# Patient Record
Sex: Female | Born: 1965
Health system: Southern US, Community
[De-identification: ages and names within clinical notes are randomized; demographics above are authoritative.]

## PROBLEM LIST (undated history)

## (undated) DIAGNOSIS — F329 Major depressive disorder, single episode, unspecified: Secondary | ICD-10-CM

## (undated) DIAGNOSIS — F32A Depression, unspecified: Secondary | ICD-10-CM

## (undated) DIAGNOSIS — M1990 Unspecified osteoarthritis, unspecified site: Secondary | ICD-10-CM

## (undated) DIAGNOSIS — M5430 Sciatica, unspecified side: Secondary | ICD-10-CM

## (undated) DIAGNOSIS — T7840XA Allergy, unspecified, initial encounter: Secondary | ICD-10-CM

## (undated) DIAGNOSIS — R112 Nausea with vomiting, unspecified: Secondary | ICD-10-CM

## (undated) DIAGNOSIS — Z9889 Other specified postprocedural states: Secondary | ICD-10-CM

## (undated) DIAGNOSIS — C50911 Malignant neoplasm of unspecified site of right female breast: Secondary | ICD-10-CM

## (undated) HISTORY — DX: Allergy, unspecified, initial encounter: T78.40XA

## (undated) HISTORY — PX: BREAST CYST ASPIRATION: SHX578

## (undated) HISTORY — PX: BREAST BIOPSY: SHX20

## (undated) HISTORY — PX: ABDOMINAL HYSTERECTOMY: SHX81

## (undated) HISTORY — PX: WISDOM TOOTH EXTRACTION: SHX21

## (undated) HISTORY — PX: TONSILLECTOMY: SUR1361

---
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
## 1988-01-05 HISTORY — PX: TOE SURGERY: SHX1073

## 1991-01-05 HISTORY — PX: CERVIX SURGERY: SHX593

## 2003-01-05 HISTORY — PX: NASAL/SINUS ENDOSCOPY: SHX288

## 2011-07-01 DIAGNOSIS — M549 Dorsalgia, unspecified: Secondary | ICD-10-CM | POA: Insufficient documentation

## 2011-07-01 DIAGNOSIS — M25569 Pain in unspecified knee: Secondary | ICD-10-CM | POA: Insufficient documentation

## 2011-07-01 DIAGNOSIS — M1710 Unilateral primary osteoarthritis, unspecified knee: Secondary | ICD-10-CM | POA: Insufficient documentation

## 2011-07-01 DIAGNOSIS — M25369 Other instability, unspecified knee: Secondary | ICD-10-CM | POA: Insufficient documentation

## 2012-04-27 ENCOUNTER — Other Ambulatory Visit (HOSPITAL_COMMUNITY): Payer: Self-pay | Admitting: Orthopedic Surgery

## 2012-04-27 DIAGNOSIS — M25561 Pain in right knee: Secondary | ICD-10-CM

## 2012-04-29 ENCOUNTER — Ambulatory Visit (HOSPITAL_COMMUNITY)
Admission: RE | Admit: 2012-04-29 | Discharge: 2012-04-29 | Disposition: A | Discharge status: Home / Self Care | Payer: 59 | Source: Ambulatory Visit | Attending: Orthopedic Surgery | Admitting: Orthopedic Surgery

## 2012-04-29 DIAGNOSIS — M25569 Pain in unspecified knee: Secondary | ICD-10-CM | POA: Insufficient documentation

## 2012-04-29 DIAGNOSIS — M2240 Chondromalacia patellae, unspecified knee: Secondary | ICD-10-CM | POA: Insufficient documentation

## 2012-04-29 DIAGNOSIS — M25561 Pain in right knee: Secondary | ICD-10-CM

## 2013-12-14 ENCOUNTER — Emergency Department (HOSPITAL_COMMUNITY)
Admission: EM | Admit: 2013-12-14 | Discharge: 2013-12-14 | Disposition: A | Discharge status: Home / Self Care | Payer: 59 | Source: Home / Self Care | Attending: Family Medicine | Admitting: Family Medicine

## 2013-12-14 ENCOUNTER — Encounter (HOSPITAL_COMMUNITY): Payer: Self-pay | Admitting: *Deleted

## 2013-12-14 DIAGNOSIS — M5432 Sciatica, left side: Secondary | ICD-10-CM

## 2013-12-14 MED ORDER — CYCLOBENZAPRINE HCL 5 MG PO TABS: 5.0000 mg | ORAL_TABLET | Freq: Three times a day (TID) | ORAL | Status: DC | PRN

## 2013-12-14 MED ORDER — METHYLPREDNISOLONE 4 MG PO KIT
PACK | ORAL | Status: DC
Start: 2013-12-14 — End: 2015-09-12

## 2013-12-14 MED ORDER — METHYLPREDNISOLONE SODIUM SUCC 125 MG IJ SOLR
125.0000 mg | Freq: Once | INTRAMUSCULAR | Status: AC
  Administered 2013-12-14: 125 mg via INTRAMUSCULAR

## 2013-12-14 MED ORDER — METHYLPREDNISOLONE SODIUM SUCC 125 MG IJ SOLR
INTRAMUSCULAR | Status: AC
  Filled 2013-12-14: qty 2

## 2013-12-14 NOTE — ED Provider Notes (Signed)
CSN: [id number]     Arrival date & time 12/14/13  1038 History   First MD Initiated Contact with Patient 12/14/13 1128     Chief Complaint  Patient presents with  . Back Pain   (Consider location/radiation/quality/duration/timing/severity/associated sxs/prior Treatment) Patient is a 48 y.o. female presenting with back pain. The history is provided by the patient.  Back Pain Location:  Sacro-iliac joint Quality:  Shooting Radiates to:  L posterior upper leg and L foot Pain severity:  Moderate Onset quality:  Sudden Duration:  5 days Timing:  Constant Progression:  Worsening Chronicity:  New Context: not recent illness, not recent injury and not twisting   Relieved by:  None tried Worsened by:  Nothing tried Ineffective treatments:  None tried Associated symptoms: leg pain, numbness and paresthesias   Associated symptoms: no abdominal pain, no bladder incontinence, no bowel incontinence, no dysuria, no fever and no pelvic pain   Risk factors: no steroid use     History reviewed. No pertinent past medical history. History reviewed. No pertinent past surgical history. History reviewed. No pertinent family history. History  Substance Use Topics  . Smoking status: Not on file  . Smokeless tobacco: Not on file  . Alcohol Use: No   OB History    No data available     Review of Systems  Constitutional: Negative.  Negative for fever.  Gastrointestinal: Negative.  Negative for abdominal pain and bowel incontinence.  Genitourinary: Negative for bladder incontinence, dysuria, urgency, hematuria, flank pain, difficulty urinating, menstrual problem and pelvic pain.  Musculoskeletal: Positive for back pain and gait problem. Negative for joint swelling and neck pain.  Skin: Negative.   Neurological: Positive for numbness and paresthesias.    Allergies  Keflex  Home Medications   Prior to Admission medications   Medication Sig Start Date End Date Taking? Authorizing Provider    Naproxen Sodium (ALEVE PO) Take by mouth.   Yes Historical Provider, MD  cyclobenzaprine (FLEXERIL) 5 MG tablet Take 1 tablet (5 mg total) by mouth 3 (three) times daily as needed for muscle spasms. 12/14/13   Billy Fischer, MD  methylPREDNISolone (MEDROL DOSEPAK) 4 MG tablet follow package directions, start on sat, take until finished 12/14/13   Billy Fischer, MD   BP 130/97 mmHg  Pulse 80  Temp(Src) 98 F (36.7 C) (Oral)  Resp 16  SpO2 96% Physical Exam  Constitutional: She is oriented to person, place, and time. She appears well-developed and well-nourished. She appears distressed.  Abdominal: Soft. Bowel sounds are normal.  Musculoskeletal: Normal range of motion. She exhibits tenderness.       Lumbar back: She exhibits tenderness and pain. She exhibits normal pulse.       Back:  Neurological: She is alert and oriented to person, place, and time.  Skin: Skin is warm and dry.  Nursing note and vitals reviewed.   ED Course  Procedures (including critical care time) Labs Review Labs Reviewed - No data to display  Imaging Review No results found.   MDM   1. Sciatica neuralgia, left        Billy Fischer, MD 12/14/13 1153

## 2013-12-14 NOTE — ED Notes (Signed)
Pt   Reports  Pain lower  Back  Down      l  Leg           X     1  Week     -  denys  A  specefic  Injury          Pt         Reports  Symptoms  Not  releived  By  Beckie Busing          And  Home  remedys

## 2013-12-14 NOTE — Discharge Instructions (Signed)
Heat and bedrest, take medicine as prescribed, see dr Ellene Route if problem continues.

## 2015-07-24 DIAGNOSIS — H524 Presbyopia: Secondary | ICD-10-CM | POA: Diagnosis not present

## 2015-08-25 DIAGNOSIS — Z1231 Encounter for screening mammogram for malignant neoplasm of breast: Secondary | ICD-10-CM | POA: Diagnosis not present

## 2015-08-25 DIAGNOSIS — Z1212 Encounter for screening for malignant neoplasm of rectum: Secondary | ICD-10-CM | POA: Diagnosis not present

## 2015-08-25 DIAGNOSIS — Z01419 Encounter for gynecological examination (general) (routine) without abnormal findings: Secondary | ICD-10-CM | POA: Diagnosis not present

## 2015-08-27 ENCOUNTER — Other Ambulatory Visit: Payer: Self-pay | Admitting: Gynecology

## 2015-08-27 ENCOUNTER — Ambulatory Visit (HOSPITAL_COMMUNITY)
Admission: EM | Admit: 2015-08-27 | Discharge: 2015-08-27 | Disposition: A | Discharge status: Home / Self Care | Payer: 59 | Attending: Nurse Practitioner | Admitting: Nurse Practitioner

## 2015-08-27 ENCOUNTER — Encounter (HOSPITAL_COMMUNITY): Payer: Self-pay | Admitting: Emergency Medicine

## 2015-08-27 DIAGNOSIS — M5432 Sciatica, left side: Secondary | ICD-10-CM

## 2015-08-27 DIAGNOSIS — R928 Other abnormal and inconclusive findings on diagnostic imaging of breast: Secondary | ICD-10-CM

## 2015-08-27 MED ORDER — METHYLPREDNISOLONE SODIUM SUCC 125 MG IJ SOLR
INTRAMUSCULAR | Status: AC
  Filled 2015-08-27: qty 2

## 2015-08-27 MED ORDER — PREDNISONE 20 MG PO TABS: 20.0000 mg | ORAL_TABLET | Freq: Every day | ORAL | 0 refills | Status: AC

## 2015-08-27 MED ORDER — METHYLPREDNISOLONE SODIUM SUCC 125 MG IJ SOLR
125.0000 mg | Freq: Once | INTRAMUSCULAR | Status: AC
  Administered 2015-08-27: 125 mg via INTRAMUSCULAR

## 2015-08-27 NOTE — ED Triage Notes (Signed)
The patient presented to the [facility] with a complaint of lower back pain on the right side that radiates into her right leg. The patient stated that the pain started this am. The patient denied any known injury but has a hx of sciatica.

## 2015-08-27 NOTE — ED Provider Notes (Signed)
CSN: [id number]     Arrival date & time 08/27/15  [id number] History   First MD Initiated Contact with Patient 08/27/15 1053     Chief Complaint  Patient presents with  . Back Pain   (Consider location/radiation/quality/duration/timing/severity/associated sxs/prior Treatment) Patient is a 50 y.o female, presents today for left sciatica pain onset this morning. 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 Pain is 9/10, constantly sharp, radiates down her left hip. She denies bladder or bowel incontinence, dizziness, numbness. This has happened before and was treated with solu-medrol 125mg  IM last night and discharged with steroid prescription, which she says that they helped. Patient would like another steroid  Shot today.         History reviewed. No pertinent past medical history. History reviewed. No pertinent surgical history. History reviewed. No pertinent family history. Social History  Substance Use Topics  . Smoking status: Never Smoker  . Smokeless tobacco: Never Used  . Alcohol use No   OB History    No data available     Review of Systems  Constitutional: Negative for chills, fatigue and fever.  Respiratory: Negative.  Negative for shortness of breath.   Cardiovascular: Negative.  Negative for chest pain and palpitations.  Gastrointestinal: Negative for abdominal pain.       Negative for incontinence   Musculoskeletal: Positive for back pain.       See HPI  Neurological: Negative for dizziness and numbness.  All other systems reviewed and are negative.   Allergies  Keflex [cephalexin]  Home Medications   Prior to Admission medications   Medication Sig Start Date End Date Taking? Authorizing Provider  cyclobenzaprine (FLEXERIL) 5 MG tablet Take 1 tablet (5 mg total) by mouth 3 (three) times daily as needed for muscle spasms. 12/14/13   Billy Fischer, MD  methylPREDNISolone (MEDROL DOSEPAK) 4 MG tablet follow package  directions, start on sat, take until finished 12/14/13   Billy Fischer, MD  Naproxen Sodium (ALEVE PO) Take by mouth.    Historical Provider, MD  predniSONE (DELTASONE) 20 MG tablet Take 1 tablet (20 mg total) by mouth daily with breakfast. Take 3 tablets from 1-3, take 2 tablets from day 4-6, take 1 tablet from day 7-9. 08/27/15 09/05/15  Barry Dienes, NP   Meds Ordered and Administered this Visit   Medications  methylPREDNISolone sodium succinate (SOLU-MEDROL) 125 mg/2 mL injection 125 mg (125 mg Intramuscular Given 08/27/15 1126)    BP 115/80 (BP Location: Left Arm)   Pulse 78   Temp 99.5 F (37.5 C) (Oral)   Resp 20   SpO2 96%  No data found.   Physical Exam  Constitutional: She appears well-developed and well-nourished.  Cardiovascular: Normal rate and regular rhythm.   Pulmonary/Chest: Effort normal and breath sounds normal.  Abdominal: Soft. Bowel sounds are normal.  Musculoskeletal: Normal range of motion. She exhibits no edema, tenderness or deformity.  No tenderness on palpation over her left lower back region. Positive straight leg raise.   Lymphadenopathy:    She has no cervical adenopathy.  Nursing note and vitals reviewed.   Urgent Care Course   Clinical Course    Procedures (including critical care time)  Labs Review Labs Reviewed - No data to display  Imaging Review No results found.   Visual Acuity Review  Right Eye Distance:   Left Eye Distance:   Bilateral Distance:    Right Eye Near:  Left Eye Near:    Bilateral Near:         MDM   1. Sciatica of left side    No concern or red flags noted on the physical exam. Patient is neurologically intact.  Patient treated with solumedrol 125 mg as requested. Patient also sent home with prednisone taper for 9 days. Patient instructed to follow primary care doctor if she does not improve. Work note was offered but patient declined. Discharge injection given. All questions were answered    Barry Dienes,  NP 08/27/15 1201

## 2015-09-02 ENCOUNTER — Other Ambulatory Visit: Payer: Self-pay | Admitting: Gynecology

## 2015-09-02 ENCOUNTER — Ambulatory Visit
Admission: RE | Admit: 2015-09-02 | Discharge: 2015-09-02 | Disposition: A | Discharge status: Home / Self Care | Payer: 59 | Source: Ambulatory Visit | Attending: Gynecology | Admitting: Gynecology

## 2015-09-02 DIAGNOSIS — R928 Other abnormal and inconclusive findings on diagnostic imaging of breast: Secondary | ICD-10-CM

## 2015-09-02 DIAGNOSIS — R920 Mammographic microcalcification found on diagnostic imaging of breast: Secondary | ICD-10-CM | POA: Diagnosis not present

## 2015-09-02 DIAGNOSIS — N6310 Unspecified lump in the right breast, unspecified quadrant: Secondary | ICD-10-CM

## 2015-09-02 DIAGNOSIS — N63 Unspecified lump in breast: Secondary | ICD-10-CM | POA: Diagnosis not present

## 2015-09-02 DIAGNOSIS — R921 Mammographic calcification found on diagnostic imaging of breast: Secondary | ICD-10-CM

## 2015-09-05 ENCOUNTER — Other Ambulatory Visit: Payer: Self-pay | Admitting: Gynecology

## 2015-09-05 DIAGNOSIS — R921 Mammographic calcification found on diagnostic imaging of breast: Secondary | ICD-10-CM

## 2015-09-05 DIAGNOSIS — N6310 Unspecified lump in the right breast, unspecified quadrant: Secondary | ICD-10-CM

## 2015-09-09 ENCOUNTER — Ambulatory Visit
Admission: RE | Admit: 2015-09-09 | Discharge: 2015-09-09 | Disposition: A | Discharge status: Home / Self Care | Payer: 59 | Source: Ambulatory Visit | Attending: Gynecology | Admitting: Gynecology

## 2015-09-09 DIAGNOSIS — R921 Mammographic calcification found on diagnostic imaging of breast: Secondary | ICD-10-CM

## 2015-09-09 DIAGNOSIS — N6310 Unspecified lump in the right breast, unspecified quadrant: Secondary | ICD-10-CM

## 2015-09-09 DIAGNOSIS — N63 Unspecified lump in breast: Secondary | ICD-10-CM | POA: Diagnosis not present

## 2015-09-09 DIAGNOSIS — D0511 Intraductal carcinoma in situ of right breast: Secondary | ICD-10-CM | POA: Diagnosis not present

## 2015-09-09 DIAGNOSIS — C50411 Malignant neoplasm of upper-outer quadrant of right female breast: Secondary | ICD-10-CM | POA: Diagnosis not present

## 2015-09-09 DIAGNOSIS — C771 Secondary and unspecified malignant neoplasm of intrathoracic lymph nodes: Secondary | ICD-10-CM | POA: Diagnosis not present

## 2015-09-11 ENCOUNTER — Other Ambulatory Visit: Payer: Self-pay | Admitting: General Surgery

## 2015-09-11 ENCOUNTER — Telehealth: Payer: Self-pay | Admitting: *Deleted

## 2015-09-11 DIAGNOSIS — N6310 Unspecified lump in the right breast, unspecified quadrant: Secondary | ICD-10-CM

## 2015-09-11 DIAGNOSIS — C50411 Malignant neoplasm of upper-outer quadrant of right female breast: Secondary | ICD-10-CM | POA: Diagnosis not present

## 2015-09-11 NOTE — Telephone Encounter (Signed)
Received referral from Dr. Donne Hazel for new breast cancer to see Dr. Lindi Adie. Received appt from Dr. Lindi Adie for 09/12/15 at [place] appt time to Dr. Donne Hazel.  Gave appt to new breast pt coordinator to contact pt with new pt information.

## 2015-09-12 ENCOUNTER — Other Ambulatory Visit: Payer: Self-pay | Admitting: *Deleted

## 2015-09-12 ENCOUNTER — Telehealth: Payer: Self-pay | Admitting: *Deleted

## 2015-09-12 ENCOUNTER — Encounter: Payer: Self-pay | Admitting: Hematology and Oncology

## 2015-09-12 ENCOUNTER — Ambulatory Visit
Admission: RE | Admit: 2015-09-12 | Discharge: 2015-09-12 | Disposition: A | Discharge status: Home / Self Care | Payer: 59 | Source: Ambulatory Visit | Attending: General Surgery | Admitting: General Surgery

## 2015-09-12 ENCOUNTER — Ambulatory Visit (HOSPITAL_BASED_OUTPATIENT_CLINIC_OR_DEPARTMENT_OTHER): Payer: 59 | Admitting: Hematology and Oncology

## 2015-09-12 DIAGNOSIS — C50411 Malignant neoplasm of upper-outer quadrant of right female breast: Secondary | ICD-10-CM | POA: Diagnosis not present

## 2015-09-12 DIAGNOSIS — Z170 Estrogen receptor positive status [ER+]: Secondary | ICD-10-CM

## 2015-09-12 DIAGNOSIS — D0511 Intraductal carcinoma in situ of right breast: Secondary | ICD-10-CM | POA: Diagnosis not present

## 2015-09-12 MED ORDER — TAMOXIFEN CITRATE 20 MG PO TABS: 20.0000 mg | ORAL_TABLET | Freq: Every day | ORAL | 3 refills | Status: DC

## 2015-09-12 MED ORDER — GADOBENATE DIMEGLUMINE 529 MG/ML IV SOLN
18.0000 mL | Freq: Once | INTRAVENOUS | Status: AC | PRN
  Administered 2015-09-12: 18 mL via INTRAVENOUS

## 2015-09-12 MED FILL — TAMOXIFEN 20 MG TABLET: 20 | 90 days supply | Qty: 90 | Fill #0

## 2015-09-12 NOTE — Assessment & Plan Note (Signed)
Right breast biopsy OUQ 09/09/2015: DCIS with calcs and necrosis ER 95%, PR 80%; 9:30 position 5cmfn: IDC with DCIS ER 95%, PR 65%, Ki-67 20%,; right biopsy 8:30 position 3cmfn ER 95%, PR 5%, Ki-67 10%: intramammary LN with IDC  Right breast UOQ 09/02/2015: 2 masses 3 cm apart, U/S they measured 3.4 cm, larger mass at 1.6 cm, in addition, 1 cm mass at 9:00 and 0.9 cm mass at 8:30( could be intramammary LN); right axillary LN 11 mm; microcalcs 10 cm span   Pathology and radiology counseling: Discussed with the patient, the details of pathology including the type of breast cancer,the clinical staging, the significance of ER, PR and HER-2/neu receptors and the implications for treatment. After reviewing the pathology in detail, we proceeded to discuss the different treatment options between surgery, radiation, chemotherapy, antiestrogen therapies.  Clinical staging: T2 N1 stage IIB Plan: 1. Breast MRI 2. Awaiting results of HER-2 testing 3. Initial treatment could be mastectomy with targeted axillary dissection vs neoadjuvant chemotherapy 4. Genetics referral 5. Staging scans 6. As we await the results of these tests, I recommended starting the patient on tamoxifen therapy  Tamoxifen counseling:We discussed the risks and benefits of tamoxifen. These include but not limited to insomnia, hot flashes, mood changes, vaginal dryness, and weight gain. Although rare, serious side effects including endometrial cancer, risk of blood clots were also discussed. We strongly believe that the benefits far outweigh the risks. Patient understands these risks and consented to starting treatment. Planned treatment duration is until surgery for the time being

## 2015-09-12 NOTE — Progress Notes (Signed)
Cancer she is here we're waiting on the great toe her that before this finding that rate [place] NOTE  Patient Care Team: No Pcp Per Patient as PCP - General (General Practice)  CHIEF COMPLAINTS/PURPOSE OF CONSULTATION:  Newly diagnosed breast cancer  HISTORY OF PRESENTING ILLNESS:  Melanie Barber 50 y.o. female is here because of recent diagnosis of multifocal right breast cancer. Patient had a routine screening mammogram the detected multiple abnormalities in the right breast. She underwent biopsy of the right breast mass and intramammary lymph node along with the area of microcalcifications. Microcalcifications came back as DCIS. The breast mass and intramammary lymph nodes are positive invasive ductal carcinoma that is ER/PR positive HER-2 negative. It's a grade 2 disease. This morning she underwent breast MRI which showed multifocal involvement of the breast. There is a 2 minute centimeter Nodule that was also identified in the subareolar region. 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 She had made a decision to undergo bilateral mastectomies. She will also need reconstruction. She is here accompanied by her friend. She has a son who lives with her.  I reviewed her records extensively and collaborated the history with the patient.  SUMMARY OF ONCOLOGIC HISTORY:   Breast cancer of upper-outer quadrant of right female breast ([place])   09/02/2015 Mammogram    Right breast UOQ: 2 masses 3 cm apart, U/S they measured 3.4 cm, larger mass at 1.6 cm, in addition, 1 cm mass at 9:00 and 0.9 cm mass at 8:30( could be intramammary LN); right axillary LN 11 mm; microcalcs 10 cm span       09/09/2015 Initial Diagnosis    Right breast biopsy OUQ: DCIS with calcs and necrosis ER 95%, PR 80%; 9:30 position 5cmfn: IDC with DCIS ER 95%, PR 65%, Ki-67 20%,; right biopsy 8:30 position 3cmfn ER 95%, PR 5%, Ki-67 10%: intramammary LN with IDC       MEDICAL HISTORY: denies any chronic medical illnesses  SURGICAL HISTORY:no prior surgeries  SOCIAL HISTORY: denies any tobacco, alcohol or recreational drug use She is currently working in the ICU as an ICU nurse. FAMILY HISTORY: she has family history of breast cancer. She does not know much about her mother or father's history.  ALLERGIES:  is allergic to keflex [cephalexin].  MEDICATIONS:  Current Outpatient Prescriptions  Medication Sig Dispense Refill  . cyclobenzaprine (FLEXERIL) 5 MG tablet Take 1 tablet (5 mg total) by mouth 3 (three) times daily as needed for muscle spasms. 30 tablet 0  . methylPREDNISolone (MEDROL DOSEPAK) 4 MG tablet follow package directions, start on sat, take until finished 21 tablet 0  . Naproxen Sodium (ALEVE PO) Take by mouth.     No current facility-administered medications for this visit.     REVIEW OF SYSTEMS:   Constitutional: Denies fevers, chills or abnormal night sweats Eyes: Denies blurriness of vision, double vision or watery eyes Ears, nose, mouth, throat, and face: Denies mucositis or sore throat Respiratory: Denies cough, dyspnea or wheezes Cardiovascular: Denies palpitation, chest discomfort or lower extremity swelling Gastrointestinal:  Denies nausea, heartburn or change in bowel habits Skin: Denies abnormal skin rashes Lymphatics: Denies new lymphadenopathy or easy bruising Neurological:Denies numbness, tingling or new weaknesses Behavioral/Psych: Mood is stable, no new changes  Breast:  Denies any palpable lumps or discharge All other systems were reviewed with the patient and are negative.  PHYSICAL EXAMINATION: ECOG PERFORMANCE STATUS: 1 - Symptomatic but completely ambulatory  Vitals:   09/12/15 1043  BP: 117/76  Pulse: 91  Resp: 18  Temp: 97.9 F (36.6 C)   Filed Weights   09/12/15 1043  Weight: 190 lb 14.4 oz (86.6 kg)    GENERAL:alert, no distress and comfortable SKIN: skin color, texture, turgor are normal,  no rashes or significant lesions EYES: normal, conjunctiva are pink and non-injected, sclera clear OROPHARYNX:no exudate, no erythema and lips, buccal mucosa, and tongue normal  NECK: supple, thyroid normal size, non-tender, without nodularity LYMPH:  no palpable lymphadenopathy in the cervical, axillary or inguinal LUNGS: clear to auscultation and percussion with normal breathing effort HEART: regular rate & rhythm and no murmurs and no lower extremity edema ABDOMEN:abdomen soft, non-tender and normal bowel sounds Musculoskeletal:no cyanosis of digits and no clubbing  PSYCH: alert & oriented x 3 with fluent speech NEURO: no focal motor/sensory deficits BREAST: No palpable nodules in breast. No palpable axillary or supraclavicular lymphadenopathy (exam performed in the presence of a chaperone)   RADIOGRAPHIC STUDIES: I have personally reviewed the radiological reports and agreed with the findings in the report.  ASSESSMENT AND PLAN:  Breast cancer of upper-outer quadrant of right female breast ([place]) Right breast biopsy OUQ 09/09/2015: DCIS with calcs and necrosis ER 95%, PR 80%; 9:30 position 5cmfn: IDC with DCIS ER 95%, PR 65%, Ki-67 20%,; right biopsy 8:30 position 3cmfn ER 95%, PR 5%, Ki-67 10%, HER-2 negative: intramammary LN with IDC  Right breast UOQ 09/02/2015: 2 masses 3 cm apart, U/S they measured 3.4 cm, larger mass at 1.6 cm, in addition, 1 cm mass at 9:00 and 0.9 cm mass at 8:30( could be intramammary LN); right axillary LN 11 mm; microcalcs 10 cm span   Pathology and radiology counseling: Discussed with the patient, the details of pathology including the type of breast cancer,the clinical staging, the significance of ER, PR and HER-2/neu receptors and the implications for treatment. After reviewing the pathology in detail, we proceeded to discuss the different treatment options between surgery, radiation, chemotherapy, antiestrogen therapies.  Breast MRI 09/12/2015: Multicentric  right breast cancer cluster of enhancing masses 4.1 x 2.2 x 2.6 cm, LOQ: 1.1 cm enhancing mass previously biopsied, subcutaneous low right breast 2 cm mass not biopsied, multiple other small enhancing masses, 1 cm right axillary lymph node  Clinical staging: T2 N1 stage IIB Plan: 1. Initial treatment is planned to be bil mastectomy with reconstruction and with targeted axillary dissection 2. Genetics referral 3. Staging scans  4. Mammaprint testing to see if she needs a port placement 5. As we await the results of these tests, I recommended starting the patient on tamoxifen therapy  Tamoxifen counseling:We discussed the risks and benefits of tamoxifen. These include but not limited to insomnia, hot flashes, mood changes, vaginal dryness, and weight gain. Although rare, serious side effects including endometrial cancer, risk of blood clots were also discussed. We strongly believe that the benefits far outweigh the risks. Patient understands these risks and consented to starting treatment. Planned treatment duration is until surgery for the time being  All questions were answered. The patient knows to call the clinic with any problems, questions or concerns.    Rulon Eisenmenger, MD 09/12/15

## 2015-09-12 NOTE — Telephone Encounter (Signed)
Received order per Dr. Lindi Adie for Mammaprint testing on core bx. Requisition sent to pathology. Received by Varney Biles.

## 2015-09-12 NOTE — Telephone Encounter (Signed)
  Oncology Nurse Navigator Documentation    Navigator Encounter Type: Telephone (Gave appt with Dr. Iran Planas 09/18/15 at 10:30) (09/12/15 1244) Telephone: Lahoma Crocker Call;Appt Confirmation/Clarification (09/12/15 1244)                                        Time Spent with Patient: 30 (09/12/15 1244)

## 2015-09-15 ENCOUNTER — Ambulatory Visit
Admission: RE | Admit: 2015-09-15 | Discharge: 2015-09-15 | Disposition: A | Discharge status: Home / Self Care | Payer: 59 | Source: Ambulatory Visit | Attending: General Surgery | Admitting: General Surgery

## 2015-09-15 ENCOUNTER — Other Ambulatory Visit: Payer: Self-pay | Admitting: General Surgery

## 2015-09-15 DIAGNOSIS — N6310 Unspecified lump in the right breast, unspecified quadrant: Secondary | ICD-10-CM

## 2015-09-17 ENCOUNTER — Other Ambulatory Visit (HOSPITAL_COMMUNITY): Payer: Self-pay | Admitting: Infectious Diseases

## 2015-09-17 DIAGNOSIS — C50411 Malignant neoplasm of upper-outer quadrant of right female breast: Secondary | ICD-10-CM

## 2015-09-17 DIAGNOSIS — Z01818 Encounter for other preprocedural examination: Secondary | ICD-10-CM

## 2015-09-18 ENCOUNTER — Encounter (HOSPITAL_COMMUNITY)
Admission: RE | Admit: 2015-09-18 | Discharge: 2015-09-18 | Disposition: A | Discharge status: Home / Self Care | Payer: 59 | Source: Ambulatory Visit | Attending: Hematology and Oncology | Admitting: Hematology and Oncology

## 2015-09-18 ENCOUNTER — Ambulatory Visit (HOSPITAL_COMMUNITY)
Admission: RE | Admit: 2015-09-18 | Discharge: 2015-09-18 | Disposition: A | Discharge status: Home / Self Care | Payer: 59 | Source: Ambulatory Visit | Attending: Hematology and Oncology | Admitting: Hematology and Oncology

## 2015-09-18 ENCOUNTER — Other Ambulatory Visit: Payer: Self-pay | Admitting: General Surgery

## 2015-09-18 ENCOUNTER — Encounter (HOSPITAL_COMMUNITY): Payer: Self-pay

## 2015-09-18 DIAGNOSIS — C50411 Malignant neoplasm of upper-outer quadrant of right female breast: Secondary | ICD-10-CM | POA: Diagnosis not present

## 2015-09-18 DIAGNOSIS — R109 Unspecified abdominal pain: Secondary | ICD-10-CM | POA: Diagnosis not present

## 2015-09-18 DIAGNOSIS — C50911 Malignant neoplasm of unspecified site of right female breast: Secondary | ICD-10-CM | POA: Diagnosis not present

## 2015-09-18 MED ORDER — IOPAMIDOL (ISOVUE-300) INJECTION 61%
100.0000 mL | Freq: Once | INTRAVENOUS | Status: AC | PRN
  Administered 2015-09-18: 100 mL via INTRAVENOUS

## 2015-09-18 MED ORDER — IOPAMIDOL (ISOVUE-300) INJECTION 61%
30.0000 mL | Freq: Once | INTRAVENOUS | Status: DC | PRN
  Administered 2015-09-18: 30 mL via ORAL
  Filled 2015-09-18: qty 30

## 2015-09-18 MED ORDER — TECHNETIUM TC 99M MEDRONATE IV KIT
19.8000 | PACK | Freq: Once | INTRAVENOUS | Status: AC | PRN
  Administered 2015-09-18: 19.8 via INTRAVENOUS

## 2015-09-19 ENCOUNTER — Other Ambulatory Visit: Payer: Self-pay | Admitting: General Surgery

## 2015-09-19 DIAGNOSIS — C50911 Malignant neoplasm of unspecified site of right female breast: Secondary | ICD-10-CM

## 2015-09-22 NOTE — H&P (Signed)
Subjective:    Patient ID: Melanie Barber is a 50 y.o. female.  HPI  Patient of Dr. Donne Hazel and Lindi Adie here for consultation breast reconstruction. Presented following screening MMG with Right breast UOQ: 2 masses 3 cm apart, U/S, larger mass at 1.6 cm, in addition, 1 cm mass at 9:00 and 0.9 cm mass at 8:30(could be intramammary LN); right axillary LN 11 mm; microcalcs 10 cm span. MRI In demonstrated right breast, UOQ with cluster of enhancing masses and clumped NMEt, together measured 4.1 x 2.3 x 2.6 cm. In the LOQ, a 1.0 cm enhancing mass associated with the third biopsy marking clip, corresponding with the metastatic intramammary lymph node and separate focus of invasive mammary carcinoma. In the subareolar right breast, there is a spiculated enhancing mass 2.0 x 1.3 cm x 1.2 cm, which was not visualized mammographically.There are multiple other smaller enhancing masses involving the superior, central and inferior right breast. There is a questionably abnormal slightly subpectoral lymph node in the right axilla, not biopsied due to location.  She has had 3 biopsies to date: Right breast biopsy UOQ: DCIS with necrosis ER/ PR +; 9:30 position 5cmfn: IDC with DCIS ER/PR +, Her 2 -; right biopsy 8:30 position 3cmfn: intramammary LN with IDC, ER/PR +, HER-2. The subareolar mass noted on MRI has not been biopsied.  Mammaprint pending. Patient has elected for bilateral mastectomies.She has started tamoxifen neoadjuvantly.  Genetics pending. Staging scans scheduled for today.  Current D cup, happy with this Wt stable.   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  Review of Systems  HENT: Positive for sinus pressure.   Respiratory: Positive for cough.   Musculoskeletal: Positive for arthralgias and joint swelling.  Allergic/Immunologic: Positive for environmental allergies.  Remainder 12 point review negative     Objective:   Physical  Exam  Grade I ptosis bilat, right with resolving ecchymoses and firm area lateral to NAC- patient states not present prior to biopsy and feels in hematoma   Sn to nipple R 26 L 27 cm BW R 17 L 17 cm Nipple to IMF R 8.5 L 8.5 cm  Assessment:     Right breast ca multicentric T2 N1 stage IIB    Plan:      Discussed immediate vs delayed, autologous vs implant based reconstruction. Reviewed incisions, drains, OR length, hospital stay and recovery for each. Discussed process of expansion and implant based risks including rupture, MRI surveillance for silicone implants, infection requiring surgery or removal, contracture. Discussed asymmetry one can expect with implant unilateral and natural breast on opposite. Discussed TRAM vs DIEP, latter would require treatment at tertiary center. Discussed abdominal based complications including failure flap, abdominal bulge or hernia. Discussed future surgery dependent on adjuvant treatments.  Reviewed SSM vs NSM, latter will be asensate and not stimulate. Reviewed with both risks mastectomy flap necrosis requiring additional surgery.  We discussed that there is a chance post mastectomy radiation may be recommended given extent disease on MRI and concern for LN. Oncology has clinically staged her as N1 disease. Reviewed indications for post mastectomy radiation. Discussed the effect of radiation on reconstruction, namely nearly doubles risk, increased risk capsular contracture and wound healing problems. Counseled if radiation is recommended and she has an expander in place, we would complete expansion and I would likely recommend no implant exchange surgery for 6 months post completion of radiation.  Reviewed portfolio, examples of expanders and implants. Provided my contact information for  questions. Understandably overwhelmed by information. Plan for placement expanders at time of bilateral NSM.   Irene Limbo, MD [facility] [phone], pin [phone]

## 2015-09-23 ENCOUNTER — Other Ambulatory Visit: Payer: Self-pay | Admitting: General Surgery

## 2015-09-23 ENCOUNTER — Encounter (HOSPITAL_COMMUNITY): Payer: Self-pay

## 2015-09-25 ENCOUNTER — Telehealth: Payer: Self-pay | Admitting: *Deleted

## 2015-09-25 NOTE — Telephone Encounter (Signed)
Received Mammaprint results of High Risk.  Placed a copy on Dr. Geralyn Flash desk, gave Varney Biles a copy and took a copy to HIM to scan.

## 2015-09-26 ENCOUNTER — Ambulatory Visit (HOSPITAL_COMMUNITY)
Admission: RE | Admit: 2015-09-26 | Discharge: 2015-09-26 | Disposition: A | Discharge status: Home / Self Care | Payer: 59 | Source: Ambulatory Visit | Attending: General Surgery | Admitting: General Surgery

## 2015-09-26 ENCOUNTER — Encounter (HOSPITAL_COMMUNITY)
Admission: RE | Admit: 2015-09-26 | Discharge: 2015-09-26 | Disposition: A | Discharge status: Home / Self Care | Payer: 59 | Source: Ambulatory Visit | Attending: General Surgery | Admitting: General Surgery

## 2015-09-26 ENCOUNTER — Ambulatory Visit (HOSPITAL_BASED_OUTPATIENT_CLINIC_OR_DEPARTMENT_OTHER): Payer: 59 | Admitting: Hematology and Oncology

## 2015-09-26 ENCOUNTER — Other Ambulatory Visit: Payer: Self-pay

## 2015-09-26 ENCOUNTER — Other Ambulatory Visit (HOSPITAL_COMMUNITY): Payer: Self-pay | Admitting: *Deleted

## 2015-09-26 ENCOUNTER — Encounter (HOSPITAL_COMMUNITY): Payer: Self-pay

## 2015-09-26 DIAGNOSIS — T451X5A Adverse effect of antineoplastic and immunosuppressive drugs, initial encounter: Secondary | ICD-10-CM | POA: Diagnosis not present

## 2015-09-26 DIAGNOSIS — IMO0001 Reserved for inherently not codable concepts without codable children: Secondary | ICD-10-CM

## 2015-09-26 DIAGNOSIS — C50411 Malignant neoplasm of upper-outer quadrant of right female breast: Secondary | ICD-10-CM

## 2015-09-26 DIAGNOSIS — Z9221 Personal history of antineoplastic chemotherapy: Secondary | ICD-10-CM | POA: Insufficient documentation

## 2015-09-26 HISTORY — DX: Other specified postprocedural states: Z98.890

## 2015-09-26 HISTORY — DX: Nausea with vomiting, unspecified: R11.2

## 2015-09-26 HISTORY — DX: Sciatica, unspecified side: M54.30

## 2015-09-26 HISTORY — DX: Unspecified osteoarthritis, unspecified site: M19.90

## 2015-09-26 LAB — CBC
HEMATOCRIT: 40.2 % (ref 36.0–46.0)
Hemoglobin: 13.6 g/dL (ref 12.0–15.0)
MCH: 31.1 pg (ref 26.0–34.0)
MCHC: 33.8 g/dL (ref 30.0–36.0)
MCV: 92 fL (ref 78.0–100.0)
PLATELETS: 233 10*3/uL (ref 150–400)
RBC: 4.37 MIL/uL (ref 3.87–5.11)
RDW: 12.1 % (ref 11.5–15.5)
WBC: 5.2 10*3/uL (ref 4.0–10.5)

## 2015-09-26 LAB — BASIC METABOLIC PANEL
Anion gap: 9 (ref 5–15)
BUN: 15 mg/dL (ref 6–20)
CHLORIDE: 105 mmol/L (ref 101–111)
CO2: 25 mmol/L (ref 22–32)
CREATININE: 0.76 mg/dL (ref 0.44–1.00)
Calcium: 9.3 mg/dL (ref 8.9–10.3)
GFR calc Af Amer: >60 mL/min (ref >60)
GFR calc non Af Amer: >60 mL/min (ref >60)
Glucose, Bld: 89 mg/dL (ref 65–99)
POTASSIUM: 3.9 mmol/L (ref 3.5–5.1)
Sodium: 139 mmol/L (ref 135–145)

## 2015-09-26 LAB — ECHOCARDIOGRAM COMPLETE
Height: 67 in
Weight: 3014 oz

## 2015-09-26 MED ORDER — CHLORHEXIDINE GLUCONATE CLOTH 2 % EX PADS: 6.0000 | MEDICATED_PAD | Freq: Once | CUTANEOUS | Status: DC

## 2015-09-26 NOTE — Progress Notes (Signed)
Patient provided with copies of scans and pathology.

## 2015-09-26 NOTE — Pre-Procedure Instructions (Signed)
    Melanie Barber  09/26/2015      [place], [place] - [address] [facility] [address] [place] [address] Phone: [phone] Fax: [phone]  [id number] - [place], [place]. [place] [address] Phone: [phone] Fax: [phone]    Your procedure is scheduled on Wednesday 10/01/2015  Report to [place] at [phone].  Call this number if you have problems the morning of surgery:  [phone]   Remember:  Do not eat food or drink liquids after midnight.  Take these medicines the morning of surgery with A SIP OF WATER  Tamoxifen  Discontinue taking medications that contain aspirin, ibuprofen or NSAID's ( Goodys, BCs, Advil, Motrin, Aleve), fish oils, herbal supplements and multivitamins five days prior to your surgery.   Do not wear jewelry, make-up or nail polish.  Do not wear lotions, powders, or perfumes, or deoderant.  Do not shave 48 hours prior to surgery.  Do not bring valuables to the hospital.  [facility] is not responsible for any belongings or valuables.  Contacts, dentures or bridgework may not be worn into surgery.  Leave your suitcase in the car.  After surgery it may be brought to your room.  For patients admitted to the hospital, discharge time will be determined by your treatment team.  Patients discharged the day of surgery will not be allowed to drive home.    Please read over the following fact sheets that you were given. Surgical Site Infection Prevention

## 2015-09-26 NOTE — Progress Notes (Signed)
  Echocardiogram 2D Echocardiogram has been performed.  Melanie Barber 09/26/2015, 2:54 PM

## 2015-09-27 ENCOUNTER — Encounter: Payer: Self-pay | Admitting: Hematology and Oncology

## 2015-09-27 NOTE — Assessment & Plan Note (Signed)
Right breast biopsy OUQ 09/09/2015: DCIS with calcs and necrosis ER 95%, PR 80%; 9:30 position 5cmfn: IDC with DCIS ER 95%, PR 65%, Ki-67 20%,; right biopsy 8:30 position 3cmfn ER 95%, PR 5%, Ki-67 10%: Her 2 Neg intramammary LN with IDC  Right breast UOQ 09/02/2015: 2 masses 3 cm apart, U/S they measured 3.4 cm, larger mass at 1.6 cm, in addition, 1 cm mass at 9:00 and 0.9 cm mass at 8:30( could be intramammary LN); right axillary LN 11 mm; microcalcs 10 cm span   Clinical staging: T2 N1 stage IIB Mammaprint: High Risk  Plan: 1. Breast MRI: Rt breast multicentric disease, together 4.1 cm, 1.1 cm mass LOQ (intrammamamry LN), Subareolar region: 2 cm lesion, multiple other lesions right breast, questionable 1 cm subpectoral LN (couldnt be biopsied) 2. Initial treatment bil mastectomy with targeted axillary dissection vs neoadjuvant chemotherapy 4. Genetics referral 5. Staging scans: Neg for met disease 6. As we await surgery, patient is currently on tamoxifen therapy and is tolerating it well  Mammaprint counselling: Patient is high risk and will need systemic chemo with AC X 4 foll by Abraxane weekly X 12  Chemo Counselling: Chemotherapy Counseling: I discussed the risks and benefits of chemotherapy including the risks of nausea/ vomiting, risk of infection from low WBC count, fatigue due to chemo or anemia, bruising or bleeding due to low platelets, mouth sores, loss/ change in taste and decreased appetite. Liver and kidney function will be monitored through out chemotherapy as abnormalities in liver and kidney function may be a side effect of treatment. Cardiac dysfunction due to Adriamycin was discussed in detail. Risk of permanent bone marrow dysfunction and leukemia due to chemo were also discussed.  RTC 1 week post surgery

## 2015-09-29 ENCOUNTER — Telehealth: Payer: Self-pay | Admitting: *Deleted

## 2015-09-29 ENCOUNTER — Other Ambulatory Visit (HOSPITAL_COMMUNITY): Payer: 59

## 2015-09-29 NOTE — Telephone Encounter (Signed)
Received call from patient wanting to change her genetic appt from 10/12 to 10/5 same day as Dr. Geralyn Flash appt. Due to her drive.  New appt made for genetics on 10/5 at 11am.

## 2015-09-30 ENCOUNTER — Other Ambulatory Visit: Payer: Self-pay | Admitting: General Surgery

## 2015-09-30 NOTE — Anesthesia Preprocedure Evaluation (Addendum)
Anesthesia Evaluation  Patient identified by MRN, date of birth, ID band Patient awake    Reviewed: Allergy & Precautions, NPO status , Patient's Chart, lab work & pertinent test results  History of Anesthesia Complications (+) PONV and history of anesthetic complications  Airway Mallampati: II  TM Distance: >3 FB Neck ROM: Full  Mouth opening: Limited Mouth Opening  Dental  (+) Dental Advisory Given   Pulmonary neg pulmonary ROS,    breath sounds clear to auscultation       Cardiovascular (-) angina Rhythm:Regular Rate:Normal  09/26/15 ECHO:  EF 60-65%, valves OK   Neuro/Psych sciatica    GI/Hepatic negative GI ROS, Neg liver ROS,   Endo/Other  negative endocrine ROS  Renal/GU negative Renal ROS     Musculoskeletal  (+) Arthritis , Osteoarthritis,    Abdominal   Peds  Hematology negative hematology ROS (+)   Anesthesia Other Findings Breast cancer  Reproductive/Obstetrics                            Anesthesia Physical Anesthesia Plan  ASA: II  Anesthesia Plan: General   Post-op Pain Management: GA combined w/ Regional for post-op pain   Induction: Intravenous  Airway Management Planned: Oral ETT  Additional Equipment:   Intra-op Plan:   Post-operative Plan: Extubation in OR  Informed Consent: I have reviewed the patients History and Physical, chart, labs and discussed the procedure including the risks, benefits and alternatives for the proposed anesthesia with the patient or authorized representative who has indicated his/her understanding and acceptance.   Dental advisory given  Plan Discussed with: CRNA and Surgeon  Anesthesia Plan Comments: (Plan routine monitors, GETA with bilateral Pec blocks for post op analgesia)        Anesthesia Quick Evaluation

## 2015-10-01 ENCOUNTER — Ambulatory Visit (HOSPITAL_COMMUNITY): Payer: 59 | Admitting: Anesthesiology

## 2015-10-01 ENCOUNTER — Ambulatory Visit (HOSPITAL_COMMUNITY)
Admission: RE | Admit: 2015-10-01 | Discharge: 2015-10-01 | Disposition: A | Discharge status: Home / Self Care | Payer: 59 | Source: Ambulatory Visit | Attending: General Surgery | Admitting: General Surgery

## 2015-10-01 ENCOUNTER — Ambulatory Visit (HOSPITAL_COMMUNITY): Payer: 59

## 2015-10-01 ENCOUNTER — Encounter (HOSPITAL_COMMUNITY): Payer: Self-pay | Admitting: Certified Registered Nurse Anesthetist

## 2015-10-01 ENCOUNTER — Encounter (HOSPITAL_COMMUNITY)
Admission: RE | Disposition: A | Discharge status: Home / Self Care | Payer: Self-pay | Source: Ambulatory Visit | Attending: Plastic Surgery

## 2015-10-01 ENCOUNTER — Inpatient Hospital Stay (HOSPITAL_COMMUNITY)
Admission: RE | Admit: 2015-10-01 | Discharge: 2015-10-02 | DRG: 581 | Disposition: A | Discharge status: Home / Self Care | Payer: 59 | Source: Ambulatory Visit | Attending: Plastic Surgery | Admitting: Plastic Surgery

## 2015-10-01 ENCOUNTER — Observation Stay (HOSPITAL_COMMUNITY): Payer: 59

## 2015-10-01 DIAGNOSIS — Z452 Encounter for adjustment and management of vascular access device: Secondary | ICD-10-CM | POA: Diagnosis not present

## 2015-10-01 DIAGNOSIS — Z170 Estrogen receptor positive status [ER+]: Secondary | ICD-10-CM | POA: Diagnosis not present

## 2015-10-01 DIAGNOSIS — Z8489 Family history of other specified conditions: Secondary | ICD-10-CM

## 2015-10-01 DIAGNOSIS — Z823 Family history of stroke: Secondary | ICD-10-CM | POA: Diagnosis not present

## 2015-10-01 DIAGNOSIS — Z811 Family history of alcohol abuse and dependence: Secondary | ICD-10-CM

## 2015-10-01 DIAGNOSIS — Z818 Family history of other mental and behavioral disorders: Secondary | ICD-10-CM

## 2015-10-01 DIAGNOSIS — C50011 Malignant neoplasm of nipple and areola, right female breast: Secondary | ICD-10-CM | POA: Diagnosis not present

## 2015-10-01 DIAGNOSIS — G8918 Other acute postprocedural pain: Secondary | ICD-10-CM | POA: Diagnosis not present

## 2015-10-01 DIAGNOSIS — N6012 Diffuse cystic mastopathy of left breast: Secondary | ICD-10-CM | POA: Diagnosis not present

## 2015-10-01 DIAGNOSIS — C50912 Malignant neoplasm of unspecified site of left female breast: Secondary | ICD-10-CM | POA: Diagnosis present

## 2015-10-01 DIAGNOSIS — C50911 Malignant neoplasm of unspecified site of right female breast: Secondary | ICD-10-CM | POA: Diagnosis present

## 2015-10-01 DIAGNOSIS — Z23 Encounter for immunization: Secondary | ICD-10-CM | POA: Diagnosis not present

## 2015-10-01 DIAGNOSIS — C779 Secondary and unspecified malignant neoplasm of lymph node, unspecified: Secondary | ICD-10-CM | POA: Diagnosis not present

## 2015-10-01 DIAGNOSIS — G43909 Migraine, unspecified, not intractable, without status migrainosus: Secondary | ICD-10-CM | POA: Diagnosis present

## 2015-10-01 DIAGNOSIS — Z9012 Acquired absence of left breast and nipple: Secondary | ICD-10-CM | POA: Diagnosis not present

## 2015-10-01 DIAGNOSIS — C50411 Malignant neoplasm of upper-outer quadrant of right female breast: Secondary | ICD-10-CM | POA: Diagnosis not present

## 2015-10-01 DIAGNOSIS — Z8249 Family history of ischemic heart disease and other diseases of the circulatory system: Secondary | ICD-10-CM | POA: Diagnosis not present

## 2015-10-01 DIAGNOSIS — Z8261 Family history of arthritis: Secondary | ICD-10-CM | POA: Diagnosis not present

## 2015-10-01 DIAGNOSIS — Z95828 Presence of other vascular implants and grafts: Secondary | ICD-10-CM

## 2015-10-01 DIAGNOSIS — C50111 Malignant neoplasm of central portion of right female breast: Secondary | ICD-10-CM | POA: Diagnosis not present

## 2015-10-01 DIAGNOSIS — C50919 Malignant neoplasm of unspecified site of unspecified female breast: Secondary | ICD-10-CM

## 2015-10-01 DIAGNOSIS — N6082 Other benign mammary dysplasias of left breast: Secondary | ICD-10-CM | POA: Diagnosis not present

## 2015-10-01 DIAGNOSIS — Z803 Family history of malignant neoplasm of breast: Secondary | ICD-10-CM | POA: Diagnosis not present

## 2015-10-01 HISTORY — DX: Malignant neoplasm of unspecified site of right female breast: C50.911

## 2015-10-01 HISTORY — PX: BREAST RECONSTRUCTION WITH PLACEMENT OF TISSUE EXPANDER AND FLEX HD (ACELLULAR HYDRATED DERMIS): SHX6295

## 2015-10-01 HISTORY — PX: PORTACATH PLACEMENT: SHX2246

## 2015-10-01 HISTORY — PX: NIPPLE SPARING MASTECTOMY/SENTINAL LYMPH NODE BIOPSY/RECONSTRUCTION/PLACEMENT OF TISSUE EXPANDER: SHX6484

## 2015-10-01 HISTORY — PX: ULTRASOUND GUIDANCE FOR VASCULAR ACCESS: SHX6516

## 2015-10-01 HISTORY — PX: MASTECTOMY COMPLETE / SIMPLE W/ SENTINEL NODE BIOPSY: SUR846

## 2015-10-01 HISTORY — PX: MASTECTOMY: SHX3

## 2015-10-01 LAB — HCG, SERUM, QUALITATIVE: Preg, Serum: NEGATIVE

## 2015-10-01 SURGERY — NIPPLE SPARING MASTECTOMY WITH SENTINAL LYMPH NODE BIOPSY AND  RECONSTRUCTION WITH PLACEMENT OF TISSUE EXPANDER
Anesthesia: General | Site: Chest | Laterality: Right

## 2015-10-01 MED ORDER — PHENYLEPHRINE 40 MCG/ML (10ML) SYRINGE FOR IV PUSH (FOR BLOOD PRESSURE SUPPORT)
PREFILLED_SYRINGE | INTRAVENOUS | Status: AC
  Filled 2015-10-01: qty 10

## 2015-10-01 MED ORDER — ONDANSETRON HCL 4 MG/2ML IJ SOLN
INTRAMUSCULAR | Status: AC
  Filled 2015-10-01: qty 4

## 2015-10-01 MED ORDER — LIDOCAINE 2% (20 MG/ML) 5 ML SYRINGE
INTRAMUSCULAR | Status: AC
  Filled 2015-10-01: qty 5

## 2015-10-01 MED ORDER — GABAPENTIN 300 MG PO CAPS
ORAL_CAPSULE | ORAL | Status: AC
  Filled 2015-10-01: qty 1

## 2015-10-01 MED ORDER — PROMETHAZINE HCL 25 MG/ML IJ SOLN
6.2500 mg | INTRAMUSCULAR | Status: DC | PRN
Start: 2015-10-01 — End: 2015-10-01

## 2015-10-01 MED ORDER — HYDROMORPHONE HCL 1 MG/ML IJ SOLN
INTRAMUSCULAR | Status: DC | PRN
  Administered 2015-10-01 (×2): 0.5 mg via INTRAVENOUS

## 2015-10-01 MED ORDER — MIDAZOLAM HCL 5 MG/5ML IJ SOLN
INTRAMUSCULAR | Status: DC | PRN
  Administered 2015-10-01 (×3): 1 mg via INTRAVENOUS

## 2015-10-01 MED ORDER — LIDOCAINE 2% (20 MG/ML) 5 ML SYRINGE
INTRAMUSCULAR | Status: DC | PRN
  Administered 2015-10-01 (×2): 20 mg via INTRAVENOUS

## 2015-10-01 MED ORDER — ARTIFICIAL TEARS OP OINT
TOPICAL_OINTMENT | OPHTHALMIC | Status: AC
  Filled 2015-10-01: qty 3.5

## 2015-10-01 MED ORDER — SCOPOLAMINE 1 MG/3DAYS TD PT72
MEDICATED_PATCH | TRANSDERMAL | Status: AC
  Filled 2015-10-01: qty 1

## 2015-10-01 MED ORDER — METHYLENE BLUE 0.5 % INJ SOLN
INTRAVENOUS | Status: AC
  Filled 2015-10-01: qty 10

## 2015-10-01 MED ORDER — CIPROFLOXACIN IN D5W 400 MG/200ML IV SOLN
INTRAVENOUS | Status: AC
  Filled 2015-10-01: qty 200

## 2015-10-01 MED ORDER — PROPOFOL 10 MG/ML IV BOLUS
INTRAVENOUS | Status: DC | PRN
  Administered 2015-10-01: 200 mg via INTRAVENOUS

## 2015-10-01 MED ORDER — CIPROFLOXACIN IN D5W 400 MG/200ML IV SOLN
400.0000 mg | Freq: Two times a day (BID) | INTRAVENOUS | Status: DC
  Administered 2015-10-01 – 2015-10-02 (×2): 400 mg via INTRAVENOUS
  Filled 2015-10-01 (×3): qty 200

## 2015-10-01 MED ORDER — ROCURONIUM BROMIDE 10 MG/ML (PF) SYRINGE
PREFILLED_SYRINGE | INTRAVENOUS | Status: AC
  Filled 2015-10-01: qty 10

## 2015-10-01 MED ORDER — 0.9 % SODIUM CHLORIDE (POUR BTL) OPTIME
TOPICAL | Status: DC | PRN
  Administered 2015-10-01 (×2): 1000 mL

## 2015-10-01 MED ORDER — SODIUM CHLORIDE 0.9 % IJ SOLN
INTRAMUSCULAR | Status: AC
  Filled 2015-10-01: qty 10

## 2015-10-01 MED ORDER — CIPROFLOXACIN IN D5W 400 MG/200ML IV SOLN
400.0000 mg | INTRAVENOUS | Status: AC
  Administered 2015-10-01: 400 mg via INTRAVENOUS

## 2015-10-01 MED ORDER — ACETAMINOPHEN 500 MG PO TABS
ORAL_TABLET | ORAL | Status: AC
  Filled 2015-10-01: qty 2

## 2015-10-01 MED ORDER — HEPARIN SOD (PORK) LOCK FLUSH 100 UNIT/ML IV SOLN
INTRAVENOUS | Status: AC
  Filled 2015-10-01: qty 5

## 2015-10-01 MED ORDER — INFLUENZA VAC SPLIT QUAD 0.5 ML IM SUSY
0.5000 mL | PREFILLED_SYRINGE | INTRAMUSCULAR | Status: DC | PRN
Start: 2015-10-02 — End: 2015-10-02

## 2015-10-01 MED ORDER — ACETAMINOPHEN 325 MG PO TABS: 650.0000 mg | ORAL_TABLET | Freq: Four times a day (QID) | ORAL | Status: DC | PRN

## 2015-10-01 MED ORDER — FENTANYL CITRATE (PF) 100 MCG/2ML IJ SOLN
INTRAMUSCULAR | Status: AC
  Filled 2015-10-01: qty 2

## 2015-10-01 MED ORDER — MIDAZOLAM HCL 2 MG/2ML IJ SOLN
INTRAMUSCULAR | Status: AC
  Filled 2015-10-01: qty 4

## 2015-10-01 MED ORDER — SCOPOLAMINE 1 MG/3DAYS TD PT72
MEDICATED_PATCH | TRANSDERMAL | Status: DC | PRN
  Administered 2015-10-01: 1 via TRANSDERMAL

## 2015-10-01 MED ORDER — OXYCODONE HCL 5 MG PO TABS
5.0000 mg | ORAL_TABLET | ORAL | Status: DC | PRN
  Administered 2015-10-01 (×2): 10 mg via ORAL
  Filled 2015-10-01 (×3): qty 2

## 2015-10-01 MED ORDER — GLYCOPYRROLATE 0.2 MG/ML IV SOSY
PREFILLED_SYRINGE | INTRAVENOUS | Status: DC | PRN
  Administered 2015-10-01: 0.6 mg via INTRAVENOUS

## 2015-10-01 MED ORDER — BUPIVACAINE-EPINEPHRINE (PF) 0.25% -1:200000 IJ SOLN
INTRAMUSCULAR | Status: AC
  Filled 2015-10-01: qty 30

## 2015-10-01 MED ORDER — ROCURONIUM BROMIDE 10 MG/ML (PF) SYRINGE
PREFILLED_SYRINGE | INTRAVENOUS | Status: AC
  Filled 2015-10-01: qty 20

## 2015-10-01 MED ORDER — SODIUM CHLORIDE 0.9 % IV SOLN
INTRAVENOUS | Status: DC
  Administered 2015-10-01: 17:00:00 via INTRAVENOUS

## 2015-10-01 MED ORDER — GABAPENTIN 300 MG PO CAPS
300.0000 mg | ORAL_CAPSULE | ORAL | Status: AC
  Administered 2015-10-01: 300 mg via ORAL

## 2015-10-01 MED ORDER — SODIUM CHLORIDE 0.9 % IR SOLN
Status: DC | PRN
  Administered 2015-10-01 (×2): 500 mL

## 2015-10-01 MED ORDER — HYDROMORPHONE HCL 1 MG/ML IJ SOLN
0.2500 mg | INTRAMUSCULAR | Status: DC | PRN
  Administered 2015-10-01: 0.5 mg via INTRAVENOUS

## 2015-10-01 MED ORDER — MIDAZOLAM HCL 2 MG/2ML IJ SOLN: 0.5000 mg | Freq: Once | INTRAMUSCULAR | Status: DC | PRN

## 2015-10-01 MED ORDER — KETOROLAC TROMETHAMINE 15 MG/ML IJ SOLN
15.0000 mg | Freq: Four times a day (QID) | INTRAMUSCULAR | Status: DC | PRN
  Administered 2015-10-01 – 2015-10-02 (×4): 15 mg via INTRAVENOUS
  Filled 2015-10-01 (×4): qty 1

## 2015-10-01 MED ORDER — FENTANYL CITRATE (PF) 100 MCG/2ML IJ SOLN
INTRAMUSCULAR | Status: DC | PRN
Start: 2015-10-01 — End: 2015-10-01
  Administered 2015-10-01: 100 ug via INTRAVENOUS
  Administered 2015-10-01: 150 ug via INTRAVENOUS
  Administered 2015-10-01: 50 ug via INTRAVENOUS

## 2015-10-01 MED ORDER — BUPIVACAINE-EPINEPHRINE (PF) 0.5% -1:200000 IJ SOLN
INTRAMUSCULAR | Status: DC | PRN
  Administered 2015-10-01 (×2): 25 mL

## 2015-10-01 MED ORDER — ONDANSETRON HCL 4 MG/2ML IJ SOLN: 4.0000 mg | Freq: Four times a day (QID) | INTRAMUSCULAR | Status: DC | PRN

## 2015-10-01 MED ORDER — CHLORHEXIDINE GLUCONATE CLOTH 2 % EX PADS: 6.0000 | MEDICATED_PAD | Freq: Once | CUTANEOUS | Status: DC

## 2015-10-01 MED ORDER — HYDROMORPHONE HCL 1 MG/ML IJ SOLN
INTRAMUSCULAR | Status: AC
  Filled 2015-10-01: qty 1

## 2015-10-01 MED ORDER — TECHNETIUM TC 99M SULFUR COLLOID FILTERED
1.0000 | Freq: Once | INTRAVENOUS | Status: AC | PRN
  Administered 2015-10-01: 1 via INTRADERMAL

## 2015-10-01 MED ORDER — METHOCARBAMOL 500 MG PO TABS
500.0000 mg | ORAL_TABLET | Freq: Four times a day (QID) | ORAL | Status: DC | PRN
Start: 2015-10-01 — End: 2015-10-02
  Administered 2015-10-01: 500 mg via ORAL
  Filled 2015-10-01 (×2): qty 1

## 2015-10-01 MED ORDER — LACTATED RINGERS IV SOLN
INTRAVENOUS | Status: DC | PRN
  Administered 2015-10-01 (×4): via INTRAVENOUS

## 2015-10-01 MED ORDER — NEOSTIGMINE METHYLSULFATE 5 MG/5ML IV SOSY
PREFILLED_SYRINGE | INTRAVENOUS | Status: DC | PRN
  Administered 2015-10-01: 4 mg via INTRAVENOUS

## 2015-10-01 MED ORDER — DEXAMETHASONE SODIUM PHOSPHATE 10 MG/ML IJ SOLN
INTRAMUSCULAR | Status: DC | PRN
  Administered 2015-10-01: 10 mg via INTRAVENOUS

## 2015-10-01 MED ORDER — MORPHINE SULFATE (PF) 2 MG/ML IV SOLN: 2.0000 mg | INTRAVENOUS | Status: DC | PRN

## 2015-10-01 MED ORDER — SODIUM CHLORIDE 0.9 % IV SOLN
INTRAVENOUS | Status: DC | PRN
  Administered 2015-10-01: 500 mL

## 2015-10-01 MED ORDER — FENTANYL CITRATE (PF) 100 MCG/2ML IJ SOLN
INTRAMUSCULAR | Status: AC
Start: 2015-10-01 — End: 2015-10-01
  Filled 2015-10-01: qty 4

## 2015-10-01 MED ORDER — ACETAMINOPHEN 500 MG PO TABS
1000.0000 mg | ORAL_TABLET | ORAL | Status: AC
  Administered 2015-10-01: 1000 mg via ORAL

## 2015-10-01 MED ORDER — DEXAMETHASONE SODIUM PHOSPHATE 10 MG/ML IJ SOLN
INTRAMUSCULAR | Status: AC
  Filled 2015-10-01: qty 1

## 2015-10-01 MED ORDER — PHENYLEPHRINE HCL 10 MG/ML IJ SOLN
INTRAMUSCULAR | Status: DC | PRN
  Administered 2015-10-01 (×2): 80 ug via INTRAVENOUS

## 2015-10-01 MED ORDER — MEPERIDINE HCL 25 MG/ML IJ SOLN: 6.2500 mg | INTRAMUSCULAR | Status: DC | PRN

## 2015-10-01 MED ORDER — HEPARIN SOD (PORK) LOCK FLUSH 100 UNIT/ML IV SOLN
INTRAVENOUS | Status: DC | PRN
  Administered 2015-10-01: 400 [IU]

## 2015-10-01 MED ORDER — BUPIVACAINE HCL (PF) 0.25 % IJ SOLN
INTRAMUSCULAR | Status: DC | PRN
  Administered 2015-10-01: 9 mL

## 2015-10-01 MED ORDER — ACETAMINOPHEN 650 MG RE SUPP: 650.0000 mg | Freq: Four times a day (QID) | RECTAL | Status: DC | PRN

## 2015-10-01 MED ORDER — ROCURONIUM BROMIDE 10 MG/ML (PF) SYRINGE
PREFILLED_SYRINGE | INTRAVENOUS | Status: DC | PRN
  Administered 2015-10-01: 50 mg via INTRAVENOUS
  Administered 2015-10-01: 10 mg via INTRAVENOUS
  Administered 2015-10-01 (×2): 50 mg via INTRAVENOUS

## 2015-10-01 MED ORDER — PHENYLEPHRINE HCL 10 MG/ML IJ SOLN
INTRAVENOUS | Status: DC | PRN
  Administered 2015-10-01: 25 ug/min via INTRAVENOUS

## 2015-10-01 MED ORDER — ONDANSETRON HCL 4 MG/2ML IJ SOLN
INTRAMUSCULAR | Status: DC | PRN
  Administered 2015-10-01 (×2): 4 mg via INTRAVENOUS

## 2015-10-01 MED ORDER — PROPOFOL 10 MG/ML IV BOLUS
INTRAVENOUS | Status: AC
  Filled 2015-10-01: qty 20

## 2015-10-01 MED ORDER — ONDANSETRON 4 MG PO TBDP
4.0000 mg | ORAL_TABLET | Freq: Four times a day (QID) | ORAL | Status: DC | PRN
Start: 2015-10-01 — End: 2015-10-02

## 2015-10-01 MED ORDER — SODIUM CHLORIDE 0.9 % IR SOLN
Status: DC | PRN
  Administered 2015-10-01: 1000 mL

## 2015-10-01 MED ORDER — SIMETHICONE 80 MG PO CHEW: 40.0000 mg | CHEWABLE_TABLET | Freq: Four times a day (QID) | ORAL | Status: DC | PRN

## 2015-10-01 SURGICAL SUPPLY — 90 items
ALLOGRAFT DERM CORTIV 4X12 (Tissue Mesh) ×6 IMPLANT
APPLIER CLIP 9.375 MED OPEN (MISCELLANEOUS) ×3
BAG DECANTER FOR FLEXI CONT (MISCELLANEOUS) ×6 IMPLANT
BINDER BREAST XLRG (GAUZE/BANDAGES/DRESSINGS) ×3 IMPLANT
BLADE SURG 11 STRL SS (BLADE) ×3 IMPLANT
BLADE SURG 15 STRL LF DISP TIS (BLADE) ×2 IMPLANT
BLADE SURG 15 STRL SS (BLADE) ×1
CANISTER SUCTION 2500CC (MISCELLANEOUS) ×6 IMPLANT
CHLORAPREP W/TINT 26ML (MISCELLANEOUS) ×6 IMPLANT
CLIP APPLIE 9.375 MED OPEN (MISCELLANEOUS) ×2 IMPLANT
CONT SPEC 4OZ CLIKSEAL STRL BL (MISCELLANEOUS) ×9 IMPLANT
COVER PROBE W GEL 5X96 (DRAPES) ×3 IMPLANT
COVER SURGICAL LIGHT HANDLE (MISCELLANEOUS) ×6 IMPLANT
COVER TRANSDUCER ULTRASND GEL (DRAPE) ×3 IMPLANT
CRADLE DONUT ADULT HEAD (MISCELLANEOUS) ×3 IMPLANT
DRAIN CHANNEL 15F RND FF W/TCR (WOUND CARE) IMPLANT
DRAIN CHANNEL 19F RND (DRAIN) ×9 IMPLANT
DRAPE C-ARM 42X72 X-RAY (DRAPES) ×3 IMPLANT
DRAPE LAPAROSCOPIC ABDOMINAL (DRAPES) ×3 IMPLANT
DRAPE ORTHO SPLIT 77X108 STRL (DRAPES) ×2
DRAPE PROXIMA HALF (DRAPES) ×6 IMPLANT
DRAPE SURG ORHT 6 SPLT 77X108 (DRAPES) ×4 IMPLANT
DRAPE WARM FLUID 44X44 (DRAPE) ×3 IMPLANT
DRSG PAD ABDOMINAL 8X10 ST (GAUZE/BANDAGES/DRESSINGS) ×12 IMPLANT
DRSG TEGADERM 2-3/8X2-3/4 SM (GAUZE/BANDAGES/DRESSINGS) IMPLANT
DRSG TEGADERM 4X4.75 (GAUZE/BANDAGES/DRESSINGS) ×15 IMPLANT
ELECT BLADE 4.0 EZ CLEAN MEGAD (MISCELLANEOUS) ×6
ELECT BLADE 6.5 EXT (BLADE) ×3 IMPLANT
ELECT CAUTERY BLADE 6.4 (BLADE) ×6 IMPLANT
ELECT COATED BLADE 2.86 ST (ELECTRODE) ×3 IMPLANT
ELECT REM PT RETURN 9FT ADLT (ELECTROSURGICAL) ×3
ELECTRODE BLDE 4.0 EZ CLN MEGD (MISCELLANEOUS) ×4 IMPLANT
ELECTRODE REM PT RTRN 9FT ADLT (ELECTROSURGICAL) ×2 IMPLANT
EVACUATOR SILICONE 100CC (DRAIN) ×9 IMPLANT
EXPANDER TISSUE MX 400CC (Breast) ×4 IMPLANT
GAUZE SPONGE 4X4 12PLY STRL (GAUZE/BANDAGES/DRESSINGS) ×6 IMPLANT
GAUZE SPONGE 4X4 16PLY XRAY LF (GAUZE/BANDAGES/DRESSINGS) ×3 IMPLANT
GEL ULTRASOUND 20GR AQUASONIC (MISCELLANEOUS) ×3 IMPLANT
GLOVE BIO SURGEON STRL SZ 6 (GLOVE) ×15 IMPLANT
GLOVE BIO SURGEON STRL SZ 6.5 (GLOVE) ×3 IMPLANT
GLOVE BIO SURGEON STRL SZ7 (GLOVE) ×3 IMPLANT
GLOVE BIOGEL PI IND STRL 7.5 (GLOVE) ×4 IMPLANT
GLOVE BIOGEL PI INDICATOR 7.5 (GLOVE) ×2
GLOVE ECLIPSE 7.5 STRL STRAW (GLOVE) ×3 IMPLANT
GOWN STRL REUS W/ TWL LRG LVL3 (GOWN DISPOSABLE) ×14 IMPLANT
GOWN STRL REUS W/TWL LRG LVL3 (GOWN DISPOSABLE) ×7
KIT BASIN OR (CUSTOM PROCEDURE TRAY) ×3 IMPLANT
KIT MARKER MARGIN INK (KITS) ×3 IMPLANT
KIT PORT POWER 8FR ISP CVUE (Catheter) ×3 IMPLANT
KIT ROOM TURNOVER OR (KITS) ×6 IMPLANT
LIGHT WAVEGUIDE WIDE FLAT (MISCELLANEOUS) ×3 IMPLANT
LIQUID BAND (GAUZE/BANDAGES/DRESSINGS) ×9 IMPLANT
MARKER SKIN DUAL TIP RULER LAB (MISCELLANEOUS) ×3 IMPLANT
NEEDLE HYPO 25GX1X1/2 BEV (NEEDLE) ×3 IMPLANT
NS IRRIG 1000ML POUR BTL (IV SOLUTION) ×6 IMPLANT
PACK GENERAL/GYN (CUSTOM PROCEDURE TRAY) ×3 IMPLANT
PAD ARMBOARD 7.5X6 YLW CONV (MISCELLANEOUS) ×9 IMPLANT
PENCIL BUTTON HOLSTER BLD 10FT (ELECTRODE) ×3 IMPLANT
PIN SAFETY STERILE (MISCELLANEOUS) ×6 IMPLANT
SET ASEPTIC TRANSFER (MISCELLANEOUS) ×3 IMPLANT
SOLUTION BETADINE 4OZ (MISCELLANEOUS) ×6 IMPLANT
SPECIMEN JAR X LARGE (MISCELLANEOUS) ×6 IMPLANT
SPONGE LAP 18X18 X RAY DECT (DISPOSABLE) ×12 IMPLANT
STAPLER VISISTAT 35W (STAPLE) ×6 IMPLANT
SUT ETHILON 2 0 FS 18 (SUTURE) ×9 IMPLANT
SUT MNCRL 3 0 RB1 (SUTURE) IMPLANT
SUT MNCRL AB 4-0 PS2 18 (SUTURE) ×6 IMPLANT
SUT MON AB 5-0 PS2 18 (SUTURE) IMPLANT
SUT MONOCRYL 3 0 RB1 (SUTURE)
SUT PROLENE 2 0 SH DA (SUTURE) ×3 IMPLANT
SUT SILK 2 0 (SUTURE)
SUT SILK 2 0 SH (SUTURE) IMPLANT
SUT SILK 2-0 18XBRD TIE 12 (SUTURE) IMPLANT
SUT VIC AB 3-0 54X BRD REEL (SUTURE) IMPLANT
SUT VIC AB 3-0 BRD 54 (SUTURE)
SUT VIC AB 3-0 SH 18 (SUTURE) ×6 IMPLANT
SUT VIC AB 3-0 SH 27 (SUTURE) ×5
SUT VIC AB 3-0 SH 27X BRD (SUTURE) ×10 IMPLANT
SUT VIC AB 3-0 SH 27XBRD (SUTURE) IMPLANT
SUT VIC AB 4-0 PS2 27 (SUTURE) ×6 IMPLANT
SUT VICRYL 3 0 (SUTURE) IMPLANT
SUT VICRYL 4-0 PS2 18IN ABS (SUTURE) IMPLANT
SYR 20ML ECCENTRIC (SYRINGE) ×6 IMPLANT
SYR 5ML LUER SLIP (SYRINGE) ×3 IMPLANT
SYR BULB IRRIGATION 50ML (SYRINGE) ×3 IMPLANT
SYR CONTROL 10ML LL (SYRINGE) IMPLANT
TISSUE EXPANDER MX 400CC (Breast) ×6 IMPLANT
TOWEL OR 17X24 6PK STRL BLUE (TOWEL DISPOSABLE) ×6 IMPLANT
TOWEL OR 17X26 10 PK STRL BLUE (TOWEL DISPOSABLE) ×6 IMPLANT
TRAY FOLEY CATH 16FRSI W/METER (SET/KITS/TRAYS/PACK) ×3 IMPLANT

## 2015-10-01 NOTE — Anesthesia Procedure Notes (Addendum)
Anesthesia Regional Block:  Pectoralis block  Pre-Anesthetic Checklist: ,, timeout performed, Correct Patient, Correct Site, Correct Laterality, Correct Procedure, Correct Position, site marked, Risks and benefits discussed,  Surgical consent,  Pre-op evaluation,  At surgeon's request and post-op pain management  Laterality: Left  Prep: chloraprep       Needles:   Needle Type: Echogenic Needle     Needle Length: 9cm 9 cm Needle Gauge: 22 and 22 G    Additional Needles:  Procedures: ultrasound guided (picture in chart) Pectoralis block Narrative:  Start time: 10/01/2015 8:08 AM End time: 10/01/2015 8:14 AM Injection made incrementally with aspirations every 5 mL.  Performed by: Personally  Anesthesiologist: Glennon Mac, Mallarie Voorhies  Additional Notes: Pt identified in Holding room.  Monitors applied. Working IV access confirmed. Sterile prep, drape L clavicle and pec.  #22 ECHOgenic needle into sub pec minor space between ribs 4,5 with US guidance.  25cc 0.5% Bupivacaine with 1:200k epi injected incrementally after negative test dose, good spread across ribs.  Patient asymptomatic, VSS, no heme aspirated, tolerated well.

## 2015-10-01 NOTE — Anesthesia Procedure Notes (Addendum)
Anesthesia Regional Block:  Pectoralis block  Pre-Anesthetic Checklist: ,, timeout performed, Correct Patient, Correct Site, Correct Laterality, Correct Procedure, Correct Position, site marked, Risks and benefits discussed,  Surgical consent,  Pre-op evaluation,  At surgeon's request and post-op pain management  Laterality: Right  Prep: chloraprep       Needles:  Injection technique: Single-shot  Needle Type: Echogenic Needle     Needle Length: 9cm 9 cm Needle Gauge: 22 and 22 G    Additional Needles:  Procedures: ultrasound guided (picture in chart) Pectoralis block Narrative:  Start time: 10/01/2015 8:01 AM End time: 10/01/2015 8:07 AM Injection made incrementally with aspirations every 5 mL.  Performed by: Personally  Anesthesiologist: Glennon Mac, Danaja Lasota  Additional Notes: Pt identified in Holding room.  Monitors applied. Working IV access confirmed. Sterile prep, drape R clavicle and pec.  #22 ECHOgenic needle into sub pec minor space between ribs 4,5 with US guidance.  25cc 0.5% Bupivacaine with 1:200k epi injected incrementally after negative test dose, good spread across ribs.  Patient asymptomatic, VSS, no heme aspirated, tolerated well.

## 2015-10-01 NOTE — Op Note (Signed)
Operative Note   DATE OF OPERATION: 9.27.17  LOCATION: [facility]  SURGICAL DIVISION: Plastic Surgery  PREOPERATIVE DIAGNOSES:  1. Right breast cancer multicentric   POSTOPERATIVE DIAGNOSES:  same  PROCEDURE:  1. Bilateral breast reconstruction with tissue expander 2. Acellular dermis (Cortiva) for breast reconstruction 80 cm2  SURGEON: Irene Limbo MD MBA  ASSISTANT: none  ANESTHESIA:  General.   EBL: 450 ml for entire case  COMPLICATIONS: None immediate.   INDICATIONS FOR PROCEDURE:  The patient, Melanie Barber, is a 50 y.o. female born on 1965/05/30, is here for immediate expander based reconstruction following bilateral nipple sparing mastectomies.   FINDINGS: Natrelle 133MX-12-T 400 ml tissue expanders placed bilateral, initial fill volume 200 ml bilateral RIGHT SN [id number] LEFT SN [id number]  DESCRIPTION OF PROCEDURE:  The patient was marked with the patient in the preoperative area to mark sternal notch, chest midline, anterior axillary lines and inframammary folds. The patient was taken to the operating room. SCDs were placed and IV antibiotics were given. The patient's operative site was prepped and draped in a sterile fashion. A time out was performed and all information was confirmed to be correct. Following completion of mastectomies, reconstruction began on right side. The inferior insertions of pectoralis major muscle were elevated continuous with abdominal wall fascia. Submuscular dissection completed toward clavicle. The anterior rectus fascia was elevated 1-2 cm below inframammary fold. Cortiva was perforated and sewn to inferior border of pectoralis major with running 3-0 vicryl. A 19 Fr drain was placed in subcutaneous position laterally and submuscular position medialy and secured to skin with 2-0 nylon. The cavity was irrigated with solution containing polymyxin and bacitracin. Hemostasis was ensured. Cavity irrigated with Betadine. The tissue  expander was prepared and placed in submuscular position. The expander was secured to chest wall with a 3-0 vicryl. The inferior border of the acellular dermis was inset to elevated abdominal wall fascia inferiorly and laterally was secured to serratus fascia. The incision was closed with 3-0 vicryl in fascial layer and 4-0 vicryl in dermis. Skin closure completed with 4-0 monocryl subcuticular and tissue adhesive. Over left breast, similarly the inferior pectoralis insertions were divided and anterior rectus fascia elevated for below inframammary fold. The acellular dermis was prepared and secured to inferior border of pectoralis with 3-0 vicryl. 19 Fr drain placed in cavity prior to placement of expander. The inferior and lateral borders of acellular dermis secured with abdominal wall fascia and chest wall. Closure completed in similar fashion. The ports were accessed and filled to 200 ml bilaterally. The patient was brought to upright position and the skin flaps were redraped so that NAC was symmetric from the sternal notch and midline. Transparent, adherent dressings applied. Dry dressing and breast binder applied.  The patient was allowed to wake from anesthesia, extubated and taken to the recovery room in satisfactory condition.   SPECIMENS: none  DRAINS: 19 Fr JP in right and left breast reconstruction  Irene Limbo, MD [facility] [phone], pin [phone]

## 2015-10-01 NOTE — H&P (Signed)
50 yof referred by Dr Dorise Bullion for right breast cancer. she is cv [employer] I know from there. she has prior history of right breast cyst aspiration but no other history. family history in a mgm who was premenopausal. she has no issues with either breast. she did miss a couple years worth of mm. she had screening mm in [place] with two nodules in right outer breast noted. I think left was negative but will need to confrim. Her density is B. On mm she has two oval circumscribed masses in the ruoq. there are also microcalcifications measuring 10 cm ap, 7 cm superior to inferior and 5 cm transverse dimension. on [place] there are two adjacent irregular masses measuring in total 3.4 cm. this is 5 cm from nipple. the larger mass is 1.6x1.2x1 cm. at 9 oclock there is a 1 cm mass that is suspicious for im node. there is also at 830 a mass measuring 9 mm. [place] of right axilla was done that shows an 11 mm node near the pec the remainder of level 1 nodes normal.   Other Problems  Arthritis Back Pain Breast Cancer Lump In Breast Migraine Headache  Past Surgical History  Breast Biopsy Right. Breast Mass; Local Excision Right. Foot Surgery Left. Oral Surgery Tonsillectomy  Diagnostic Studies History  Colonoscopy never Mammogram 1-3 years ago Pap Smear 1-5 years ago  Allergies  Keflex *CEPHALOSPORINS* Anaphylaxis.  Medication History No Current Medications Medications Reconciled  Social History Illene Regulus, CMA; 09/11/2015 1:44 PM) Alcohol use Occasional alcohol use. Caffeine use Coffee. No drug use Tobacco use Never smoker.  Family History  Alcohol Abuse Mother. Arthritis Family Members In General, Mother. Breast Cancer Family Members In General. Cancer Family Members In General. Cerebrovascular Accident Family Members In General. Depression Mother. Hypertension Son. Ischemic Bowel Disease Mother.  Pregnancy / Birth History  Age at  menarche 67 years. Contraceptive History Intrauterine device. Gravida 1 Irregular periods Length (months) of breastfeeding 7-12 Maternal age 100-25 Para 1  Review of Systems  General Not Present- Appetite Loss, Chills, Fatigue, Fever, Night Sweats, Weight Gain and Weight Loss. Skin Not Present- Change in Wart/Mole, Dryness, Hives, Jaundice, New Lesions, Non-Healing Wounds, Rash and Ulcer. HEENT Present- Seasonal Allergies and Wears glasses/contact lenses. Not Present- Earache, Hearing Loss, Hoarseness, Nose Bleed, Oral Ulcers, Ringing in the Ears, Sinus Pain, Sore Throat, Visual Disturbances and Yellow Eyes. Respiratory Not Present- Bloody sputum, Chronic Cough, Difficulty Breathing, Snoring and Wheezing. Breast Present- Breast Mass. Not Present- Breast Pain, Nipple Discharge and Skin Changes. Cardiovascular Not Present- Chest Pain, Difficulty Breathing Lying Down, Leg Cramps, Palpitations, Rapid Heart Rate, Shortness of Breath and Swelling of Extremities. Gastrointestinal Not Present- Abdominal Pain, Bloating, Bloody Stool, Change in Bowel Habits, Chronic diarrhea, Constipation, Difficulty Swallowing, Excessive gas, Gets full quickly at meals, Hemorrhoids, Indigestion, Nausea, Rectal Pain and Vomiting. Female Genitourinary Not Present- Frequency, Nocturia, Painful Urination, Pelvic Pain and Urgency. Musculoskeletal Present- Back Pain, Joint Pain and Swelling of Extremities. Not Present- Joint Stiffness, Muscle Pain and Muscle Weakness. Neurological Not Present- Decreased Memory, Fainting, Headaches, Numbness, Seizures, Tingling, Tremor, Trouble walking and Weakness. Psychiatric Not Present- Anxiety, Bipolar, Change in Sleep Pattern, Depression, Fearful and Frequent crying. Endocrine Present- Hot flashes. Not Present- Cold Intolerance, Excessive Hunger, Hair Changes, Heat Intolerance and New Diabetes. Hematology Not Present- Blood Thinners, Easy Bruising, Excessive bleeding, Gland  problems, HIV and Persistent Infections.  Vitals  Weight: 192 lb Height: 67in Body Surface Area: 1.99 m Body Mass Index: 30.07 kg/m  Pulse:  79 (Regular)  BP: 122/76 (Sitting, Left Arm, Standard)   Physical Exam Rolm Bookbinder MD; 09/11/2015 1:28 PM) General Mental Status-Alert. Orientation-Oriented X3. Chest and Lung Exam Chest and lung exam reveals -on auscultation, normal breath sounds, no adventitious sounds and normal vocal resonance. Breast Nipples-No Discharge. Breast Lump-No Palpable Breast Mass. Note: large right breast lateral hematoma, no mass present other than hematoma Cardiovascular Cardiovascular examination reveals -normal heart sounds, regular rate and rhythm with no murmurs. Lymphatic Head & Neck General Head & Neck Lymphatics: Bilateral - Description - Normal. Axillary General Axillary Region: Bilateral - Description - Normal. Note: no [place] adenopathy   Assessment & Plan  BREAST CANCER OF UPPER-OUTER QUADRANT OF RIGHT FEMALE BREAST (C50.411) Bilateral nsm with right ax sn biopsy, port placement

## 2015-10-01 NOTE — Anesthesia Postprocedure Evaluation (Signed)
Anesthesia Post Note  Patient: Melanie Barber  Procedure(s) Performed: Procedure(s) (LRB): BILATERAL NIPPLE SPARING MASTECTOMY WITH RIGHT SENTINAL LYMPH NODE BIOPSY (Bilateral) INSERTION PORT-A-CATH (Right) BREAST RECONSTRUCTION WITH PLACEMENT OF TISSUE EXPANDER AND CORTIVA (Bilateral) ULTRASOUND GUIDANCE (Right)  Patient location during evaluation: PACU Anesthesia Type: General and Regional Level of consciousness: awake and alert, oriented and patient cooperative Pain management: pain level controlled Vital Signs Assessment: post-procedure vital signs reviewed and stable Respiratory status: spontaneous breathing, nonlabored ventilation, respiratory function stable and patient connected to nasal cannula oxygen Cardiovascular status: blood pressure returned to baseline and stable Postop Assessment: no signs of nausea or vomiting Anesthetic complications: no    Last Vitals:  Vitals:   10/01/15 1415 10/01/15 1430  BP: (!) 97/57 98/62  Pulse:    Resp:    Temp:      Last Pain:  Vitals:   10/01/15 1400  TempSrc:   PainSc: 7                  Melanie Barber,E. Laine Giovanetti

## 2015-10-01 NOTE — Interval H&P Note (Signed)
History and Physical Interval Note:  10/01/2015 7:37 AM  [name]  has presented today for surgery, with the diagnosis of RIGHT BREAST CANCER  The various methods of treatment have been discussed with the patient and family. After consideration of risks, benefits and other options for treatment, the patient has consented to  Procedure(s): BILATERAL NIPPLE SPARING MASTECTOMY WITH RIGHT SENTINAL LYMPH NODE BIOPSY (Bilateral) INSERTION PORT-A-CATH, with ultrasound (N/A) BREAST RECONSTRUCTION WITH PLACEMENT OF TISSUE EXPANDER, POSSIBLE CORTIVIA (Bilateral) as a surgical intervention .  The patient's history has been reviewed, patient examined, no change in status, stable for surgery.  I have reviewed the patient's chart and labs.  Questions were answered to the patient's satisfaction.     Melanie Barber

## 2015-10-01 NOTE — Interval H&P Note (Signed)
History and Physical Interval Note:  10/01/2015 8:00 AM  [name]  has presented today for surgery, with the diagnosis of RIGHT BREAST CANCER  The various methods of treatment have been discussed with the patient and family. After consideration of risks, benefits and other options for treatment, the patient has consented to  Procedure(s): BILATERAL NIPPLE SPARING MASTECTOMY WITH RIGHT SENTINAL LYMPH NODE BIOPSY (Bilateral) INSERTION PORT-A-CATH, with ultrasound (N/A) BREAST RECONSTRUCTION WITH PLACEMENT OF TISSUE EXPANDER, POSSIBLE CORTIVIA (Bilateral) as a surgical intervention .  The patient's history has been reviewed, patient examined, no change in status, stable for surgery.  I have reviewed the patient's chart and labs.  Questions were answered to the patient's satisfaction.     Deacon Gadbois

## 2015-10-01 NOTE — Progress Notes (Signed)
Pt admitted to 6N09 via bed from PACU.  Pt AAO X4. Pt on RA. Pt has 18G to lt wrist with fluids infusing.  Pt has incision to bilat breast with skin glue, gauze, ABD, and breast binder, C/D/I.  Pt has Bilateral JP's with bloody drainage.  Pt due to void.  SCDs in place.  Report rcvd from [place], [place].  Family to bedside.  Pt has no questions at the moment.  Will continue to monitor.

## 2015-10-01 NOTE — Op Note (Addendum)
Preoperative diagnosis: right breast cancer, clinical stage II, high risk mammaprint Postoperative diagnosis: same as above Procedure: Leftprophylactic nsm, right nsm, right axillary sn biopsy, port placement Surgeon: Dr Serita Grammes Asst: Dr Irene Limbo Anesthesia: general with bilateral pectoral block EBL: minimal Drains per plastic surgery Specimens: right and left breast marked short superior long lateral and double deep, right and left nipples, right axillary sn with highest count [id number] Complications none Sponge and needle count correct dispo case turned over to plastics  Indications: This is a 68 yof with right breast cancer and elevated mammaprint.  She has elected for bilateral nsm with immediate expander reconstruction. We also discussed a right axillary sn biopsy and port placement.    Procedure: After informed consent was obtained the patient was taken to the OR. She was given antibiotics and scds were in place. She had bilateral pectoral blocks. She was placed under general anesthesia without complication. She was prepped and draped in the standard sterile surgical fashion. A timeout was performed.  I used the ultrasound to identify the right internal jugular vein. I then accessed the vein using the ultrasound. This aspirated blood. I then placed the wire.This was confirmed by fluoroscopy and ultrasound to be in the correct position. I created a pocket on the right chest. I tunneled the line between the 2 sites. I then dilated the tract and placed the dilator assembly with the sheath. This was done under fluoroscopy. I then removed the sheath and dilator. The wire was also removed. The line was then pulled back to be in the venacava. I hooked this up to the port. I sutured this into place with 2-0 Prolene in 2 places. This aspirated blood and flushed easily.This was confirmed with a final fluoroscopy. I then closed this with 2-0 Vicryl and 4-0 Monocryl. Dermabond  was placed on both the incisions. This was then broken down and the patient was reprepped and draped in the standard sterile surgical fashion.  Another timeout was performed.    I made a 12cm inframammary incision about 10cm from sternum on the left side initially. I then created a posterior flap with cautery removing the fascia from the pectoralis muscle. This was done to the parasternal region, clavicle and latissimus laterally. I then created the anterior flap. The breast was then removed in its entirety. The skin and nac were all viable. The breast was marked as above. I removed the nipple margin as a separate specimen. I also removed some additional retroareolar tissue to ensure removal of breast tissue. Hemostasis was obtained. I then packed this side and went to the rightside.  I then made a 12cm incision on the left in the IM fold. This was 10 cm from the sternum. I then removed the breast tissue in same fashion as above. I also removed the nipple specimen and sent this separately. The flaps were thinned so there was no more breast tissue.  Hemostasis was obtained. I then used the neoprobe to identify several sentinel nodes including what appeared to be the subpectoral node seen on MRI.  The highest count is as above.  There was no background radioactivity.   Case was turned over to plastic surgery for completion.

## 2015-10-01 NOTE — Transfer of Care (Signed)
Immediate Anesthesia Transfer of Care Note  Patient: Melanie Barber  Procedure(s) Performed: Procedure(s): BILATERAL NIPPLE SPARING MASTECTOMY WITH RIGHT SENTINAL LYMPH NODE BIOPSY (Bilateral) INSERTION PORT-A-CATH (Right) BREAST RECONSTRUCTION WITH PLACEMENT OF TISSUE EXPANDER AND CORTIVA (Bilateral) ULTRASOUND GUIDANCE (Right)  Patient Location: PACU  Anesthesia Type:General  Level of Consciousness: patient cooperative and responds to stimulation  Airway & Oxygen Therapy: Patient Spontanous Breathing and Patient connected to nasal cannula oxygen  Post-op Assessment: Report given to RN and Post -op Vital signs reviewed and stable  Post vital signs: Reviewed and stable  Last Vitals:  Vitals:   10/01/15 0655  BP: 123/84  Pulse: 93  Resp: 20  Temp: 36.8 C    Last Pain:  Vitals:   10/01/15 0655  TempSrc: Oral  PainSc: 0-No pain      Patients Stated Pain Goal: 3 ([id number])  Complications: No apparent anesthesia complications

## 2015-10-02 ENCOUNTER — Encounter (HOSPITAL_COMMUNITY): Payer: Self-pay | Admitting: General Surgery

## 2015-10-02 MED ORDER — SULFAMETHOXAZOLE-TRIMETHOPRIM 800-160 MG PO TABS: 1.0000 | ORAL_TABLET | Freq: Two times a day (BID) | ORAL | 0 refills | Status: DC

## 2015-10-02 MED ORDER — METHOCARBAMOL 500 MG PO TABS: 500.0000 mg | ORAL_TABLET | Freq: Three times a day (TID) | ORAL | 0 refills | Status: DC | PRN

## 2015-10-02 MED ORDER — OXYCODONE HCL 5 MG PO TABS: 5.0000 mg | ORAL_TABLET | ORAL | 0 refills | Status: DC | PRN

## 2015-10-02 MED ORDER — PNEUMOCOCCAL VAC POLYVALENT 25 MCG/0.5ML IJ INJ: 0.5000 mL | INJECTION | INTRAMUSCULAR | Status: DC

## 2015-10-02 MED ORDER — INFLUENZA VAC SPLIT QUAD 0.5 ML IM SUSY
0.5000 mL | PREFILLED_SYRINGE | Freq: Once | INTRAMUSCULAR | Status: AC
  Administered 2015-10-02: 0.5 mL via INTRAMUSCULAR
  Filled 2015-10-02: qty 0.5

## 2015-10-02 MED ORDER — PNEUMOCOCCAL VAC POLYVALENT 25 MCG/0.5ML IJ INJ
0.5000 mL | INJECTION | Freq: Once | INTRAMUSCULAR | Status: AC
  Administered 2015-10-02: 0.5 mL via INTRAMUSCULAR
  Filled 2015-10-02: qty 0.5

## 2015-10-02 MED FILL — oxyCODONE HCL 5 MG TABS: 5 | 4 days supply | Qty: 50 | Fill #0

## 2015-10-02 MED FILL — SULFAMETHOXAZOLE/TMP DS TAB: 800-160 | 6 days supply | Qty: 12 | Fill #0

## 2015-10-02 MED FILL — METHOCARBAMOL 500 MG TABLET: 500 | 10 days supply | Qty: 30 | Fill #0

## 2015-10-02 NOTE — Discharge Summary (Signed)
Physician Discharge Summary  Patient ID: ANAKA BAJAJ MRN: [id number] DOB/AGE: 08-17-1965 50 y.o.  Admit date: 10/01/2015 Discharge date: 10/02/2015  Admission Diagnoses:  Discharge Diagnoses:  Active Problems:   Breast cancer, female, right   Breast cancer, left breast [facility])   Discharged Condition: stable  Hospital Course: Patient did well post operatively. She ambulated on POD#0 and tolerated oral pain medication. Instructed on drain care.  Treatments: surgery: bilateral nipple sparing mastectomies, tissue expander, acellular dermis reconstruction, port a cath placement, sentinel node  Discharge Exam: Blood pressure (!) 103/58, pulse 63, temperature 97.8 F (36.6 C), temperature source Oral, resp. rate 18, weight 85.4 kg (188 lb 6 oz), SpO2 100 %. Incision/Wound: chest soft, flat, drains serosanguinous, Tegaderms in place  Disposition: 01-Home or Self Care  Discharge Instructions    Call MD for:  redness, tenderness, or signs of infection (pain, swelling, bleeding, redness, odor or green/yellow discharge around incision site)    Complete by:  As directed    Call MD for:  severe or increased pain, loss or decreased feeling  in affected limb(s)    Complete by:  As directed    Call MD for:  temperature >100.5    Complete by:  As directed    Discharge instructions    Complete by:  As directed    Ok to remove dressings and shower am 9.29.17. Soap and water ok, pat Tegaderms and incisions dry. Leave Tegaderms in place. No creams or ointments over incisions. Do not let drains dangle in shower, attach to lanyard or similar.Strip and record drains twice daily and bring log to clinic visit.  Breast binder or soft compression bra all other times.  Ok to raise arms above shoulders for bathing and dressing.  No house yard work or exercise until cleared by MD.   Driving Restrictions    Complete by:  As directed    No driving while taking narcotics   Lifting restrictions     Complete by:  As directed    No lifting greater than 5 lbs   Resume previous diet    Complete by:  As directed        Medication List    TAKE these medications   ibuprofen 200 MG tablet Commonly known as:  ADVIL,MOTRIN Take 400 mg by mouth every 6 (six) hours as needed for mild pain.   levonorgestrel 20 MCG/24HR IUD Commonly known as:  MIRENA 1 each by Intrauterine route once.   methocarbamol 500 MG tablet Commonly known as:  ROBAXIN Take 1 tablet (500 mg total) by mouth every 8 (eight) hours as needed for muscle spasms.   multivitamin with minerals Tabs tablet Take 1 tablet by mouth daily.   oxyCODONE 5 MG immediate release tablet Commonly known as:  Oxy IR/ROXICODONE Take 1-2 tablets (5-10 mg total) by mouth every 4 (four) hours as needed for moderate pain.   sulfamethoxazole-trimethoprim 800-160 MG tablet Commonly known as:  BACTRIM DS,SEPTRA DS Take 1 tablet by mouth 2 (two) times daily.   tamoxifen 20 MG tablet Commonly known as:  NOLVADEX Take 1 tablet (20 mg total) by mouth daily.      Follow-up Information    Derral Colucci, MD In 1 week.   Specialty:  Plastic Surgery Why:  as scheduled Contact information: [address] [place] [address] [id number]        Rolm Bookbinder, MD In 1 week.   Specialty:  General Surgery Contact information: [address]  [place] [address] [phone]           Signed: Irene Limbo 10/02/2015, 7:18 AM

## 2015-10-02 NOTE — Progress Notes (Signed)
Patient ID: Melanie Barber, female   DOB: 29-Nov-1965, 50 y.o.   MRN: [id number] Doing great, flaps and bilateral nac viable, drains as expected dc home today path pending

## 2015-10-02 NOTE — Progress Notes (Signed)
Pt discharged to home.  Discharge instructions explained to pt.  Pt has no questions at the time of discharge.  IV removed.  Pt states she has all belongings.  Pt ambulated off unit on her own.

## 2015-10-07 NOTE — Progress Notes (Addendum)
Location of Breast Cancer: Right Breast 9:30 o'clock position Upper Outer Quadrant  Histology per Pathology Report: Diagnosis: 09/09/2015: 1. Breast, right, needle core biopsy, upper outer anterior - DUCTAL CARCINOMA IN SITU WITH CALCIFICATIONS AND NECROSIS.- SEE MICROSCOPIC DESCRIPTION. 2. Breast, right, needle core biopsy, 9:30, 5cm from nipple - INVASIVE MAMMARY CARCINOMA.- DUCTAL CARCINOMA IN SITU. 3. Breast, right, needle core biopsy, 8:30, 3cm from nipple - INTRAMAMMARY LYMPH NODE WITH METASTATIC CARCINOMA.- SEPARATE FOCUS OF INVASIVE MAMMARY CARCINOMA.  Receptor Status: ER(95%+), PR (65%+), Her2-neu (neg  Ratio 1.70   ) Ki-67(20%)  Did patient present with symptoms (if so, please note symptoms) or was this found on screening mammography?:   PROCEDURE: 10/01/2015: 1. Bilateral breast reconstruction with tissue expander 2. Acellular dermis (Cortiva) for breast reconstruction 80 cm2  SURGEON: Irene Limbo MD MBA, seen 10/09/15    Past/Anticipated interventions by surgeon, if any:-Diagnosis 10/01/2015: Dr. Rolm Bookbinder, MD` 1. Breast, simple mastectomy, Left - LOBULAR NEOPLASIA (ATYPICAL LOBULAR HYPERPLASIA). - FIBROCYSTIC CHANGES WITH ADENOSIS.  2. Nipple Biopsy, Left - BENIGN BREAST PARENCHYMA. - THERE IS NO EVIDENCE OF MALIGNANCY. 3. Breast, simple mastectomy, Right - INVASIVE DUCTAL CARCINOMA, GRADE II/III, MULTIPLE FOCI, THE LARGEST SPANS 2.5 CM. - DUCTAL CARCINOMA IN SITU WITH CALCIFICATIONS, HIGH GRADE.- INVASIVE CARCINOMA IS BROADLY PRESENT AT THE ANTERIOR MARGIN (UPPER CENTRAL PORTION OFSPECIMEN).- SEE ONCOLOGY TABLE BELOW. 4. Nipple Biopsy, Right - INVASIVE DUCTAL CARCINOMA, BROADLY PRESENT AT THE INFERIOR MARGIN. 5. Lymph node, sentinel, biopsy, Right - THERE IS NO EVIDENCE OF CARCINOMA IN 1 OF 1 LYMPH NODE (0/1). 6. Lymph node, sentinel, biopsy, Right - METASTATIC CARCINOMA IN 1 OF 1 LYMPH NODE (1/1). 7. Lymph node, sentinel, biopsy, Right - THERE IS NO  EVIDENCE OF CARCINOMA IN 1 OF 1 LYMPH NODE (0/1). 8. Lymph node, sentinel, biopsy, Right - THERE IS NO EVIDENCE OF CARCINOMA IN 1 OF 1 LYMPH NODE (0/1). 9. Lymph node, sentinel, biopsy, Right - METASTATIC CARCINOMA IN 1 OF 1 LYMPH NODE (1/1). Saw Dr. Donne Hazel 10/09/15 stated everything looking good per patient  Past/Anticipated interventions by medical oncology, if any: Chemotherapy :Dr. Lindi Adie, MD, genetics testing,on Tamoxifen therapy 09/12/15  Until surgery, Saw Dr. Lindi Adie 10/09/15 started Provent trial    Lymphedema issues, if any:    Pain issues, if any:  Breasts, taking robaxin and tylenol for pain and spasms in breasts  SAFETY ISSUES: no  Prior radiation? NO  Pacemaker/ICD? NO  Possible current pregnancy? NO  Is the patient on methotrexate?  NO  Current Complaints / other details:  ICU RN Zacarias Pontes, menarche 89, G1 P1, Allergies: Keflex=Anaphylaxis Wt Readings from Last 3 Encounters:  10/09/15 187 lb 14.4 oz (85.2 kg)  10/01/15 188 lb 6 oz (85.4 kg)  09/26/15 188 lb 6 oz (85.4 kg)  BP 105/79 (BP Location: Left Arm, Patient Position: Sitting, Cuff Size: Normal)   Pulse 95   Temp 98.6 F (37 C) (Oral)   Resp 20   Ht '5\' 7"'$  (1.702 m)   Wt 186 lb 12.8 oz (84.7 kg)   BMI 29.26 kg/m  Rebecca Eaton, RN 10/07/2015,12:32 PM

## 2015-10-08 ENCOUNTER — Encounter: Payer: Self-pay | Admitting: Radiation Oncology

## 2015-10-09 ENCOUNTER — Encounter: Payer: Self-pay | Admitting: *Deleted

## 2015-10-09 ENCOUNTER — Other Ambulatory Visit: Payer: 59

## 2015-10-09 ENCOUNTER — Ambulatory Visit (HOSPITAL_BASED_OUTPATIENT_CLINIC_OR_DEPARTMENT_OTHER): Payer: 59 | Admitting: Hematology and Oncology

## 2015-10-09 ENCOUNTER — Other Ambulatory Visit: Payer: Self-pay

## 2015-10-09 ENCOUNTER — Encounter: Payer: Self-pay | Admitting: Hematology and Oncology

## 2015-10-09 ENCOUNTER — Ambulatory Visit: Payer: 59

## 2015-10-09 ENCOUNTER — Ambulatory Visit: Payer: 59 | Admitting: Genetic Counselor

## 2015-10-09 VITALS — BP 111/66 | HR 92 | Temp 97.9°F | Resp 18 | Ht 67.0 in | Wt 187.9 lb

## 2015-10-09 DIAGNOSIS — Z170 Estrogen receptor positive status [ER+]: Secondary | ICD-10-CM | POA: Diagnosis not present

## 2015-10-09 DIAGNOSIS — Z803 Family history of malignant neoplasm of breast: Secondary | ICD-10-CM

## 2015-10-09 DIAGNOSIS — C50411 Malignant neoplasm of upper-outer quadrant of right female breast: Secondary | ICD-10-CM

## 2015-10-09 DIAGNOSIS — Z8049 Family history of malignant neoplasm of other genital organs: Secondary | ICD-10-CM

## 2015-10-09 DIAGNOSIS — Z9013 Acquired absence of bilateral breasts and nipples: Secondary | ICD-10-CM | POA: Insufficient documentation

## 2015-10-09 LAB — RESEARCH LABS

## 2015-10-09 MED ORDER — LIDOCAINE-PRILOCAINE 2.5-2.5 % EX CREA: TOPICAL_CREAM | CUTANEOUS | 3 refills | Status: DC

## 2015-10-09 MED ORDER — ONDANSETRON HCL 8 MG PO TABS: 8.0000 mg | ORAL_TABLET | Freq: Two times a day (BID) | ORAL | 1 refills | Status: DC | PRN

## 2015-10-09 MED ORDER — PROCHLORPERAZINE MALEATE 10 MG PO TABS: 10.0000 mg | ORAL_TABLET | Freq: Four times a day (QID) | ORAL | 1 refills | Status: DC | PRN

## 2015-10-09 MED ORDER — LORAZEPAM 0.5 MG PO TABS: 0.5000 mg | ORAL_TABLET | Freq: Four times a day (QID) | ORAL | 0 refills | Status: DC | PRN

## 2015-10-09 MED FILL — PROCHLORPERAZINE 10 MG TAB: 10 | 8 days supply | Qty: 30 | Fill #0

## 2015-10-09 MED FILL — LIDOCAINE-PRILOCAINE CREAM: 2.5-2.5 | 15 days supply | Qty: 30 | Fill #0

## 2015-10-09 MED FILL — ONDANSETRON HCL 8 MG TABLET: 8 | 15 days supply | Qty: 30 | Fill #0

## 2015-10-09 NOTE — Progress Notes (Signed)
START ON PATHWAY REGIMEN - Breast  BOS176: AC-T - [Doxorubicin + Cyclophosphamide q21 Days x 4 Cycles, Followed by Paclitaxel Weekly x 12 Weeks]  Doxorubicin + Cyclophosphamide (AC):   A cycle is every 21 days:     Doxorubicin (Adriamycin(R)) 60 mg/m2 IV Push followed by Dose Mod: None     Cyclophosphamide (Cytoxan(R)) 600 mg/m2 in 250 mL NS IV over 30 minutes Dose Mod: None  **Always confirm dose/schedule in your pharmacy ordering system**    Paclitaxel 80 mg/m2 Weekly:   Administer weekly:     Paclitaxel (Taxol(R)) 80 mg/m2 in 250 mL NS IV over 1 hour Dose Mod: None  **Always confirm dose/schedule in your pharmacy ordering system**    Clinician Notes: Patient is intolerant to steroids. Because of this we are using Abraxane (nab paclitaxel)instead of paclitaxel  Patient Characteristics: Adjuvant Therapy, Node Positive (1-3), HER2/neu Negative/Unknown/Equivocal, ER Positive, MammaPrint(R) Ordered, High Genomic Risk AJCC Stage Grouping: IIB Current Disease Status: No Distant Mets or Local Recurrence AJCC M Stage: 0 ER Status: Positive (+) AJCC N Stage: 1a AJCC T Stage: 2 HER2/neu: Negative (-) PR Status: Positive (+) Node Status: Positive (+) (1-3 Nodes) Has this patient completed genomic testing? Yes - MammaPrint(R) MammaPrint(R) Score: High Genomic Risk  Intent of Therapy: Curative Intent, Discussed with Patient

## 2015-10-09 NOTE — Assessment & Plan Note (Signed)
Bilateral mastectomies 10/01/2015 Right mastectomy: IDC grade 2, multifocal largest 2.5 cm, with high-grade DCIS, broadly present at  anterior margin and inferior margin 2/5 LN positive, LVI Present,   Left mastectomy: ALH, ER 95%, PR 5-65%, HER-2 negative, Ki-67 10-20%,  Pathologic stage: T2 N1 a stage IIB Mammaprint: High risk CT-CAP and bone scan: No evidence of metastatic disease ----------------------------------------------------------------------------------------------------------------------------------------------------------------- Pathology counseling: I discussed the final pathology report of the patient provided  a copy of this report. I discussed the margins as well as lymph node surgeries. We also discussed the final staging along with previously performed ER/PR and HER-2/neu testing.  Treatment plan: 1. Because of lymph node involvement I discussed the risks and benefits of doing axillary lymph node dissection. With the advent of chemotherapy and adjuvant radiation, axillary lymph node dissection has not shown to have substantial benefit. 2. Adjuvant chemotherapy with dose dense Adriamycin and Cytoxan 4 followed by Abraxane weekly 12 3. Followed by adjuvant radiation therapy 4. Followed by adjuvant antiestrogen therapy with tamoxifen 10 years  Plan to start chemotherapy 1 month after surgery.

## 2015-10-09 NOTE — Progress Notes (Signed)
Patient Care Team: No Pcp Per Patient as PCP - General (General Practice)  DIAGNOSIS: No matching staging information was found for the patient.  SUMMARY OF ONCOLOGIC HISTORY:   Breast cancer of upper-outer quadrant of right female breast ([place])   09/02/2015 Mammogram    Right breast UOQ: 2 masses 3 cm apart, U/S they measured 3.4 cm, larger mass at 1.6 cm, in addition, 1 cm mass at 9:00 and 0.9 cm mass at 8:30( could be intramammary LN); right axillary LN 11 mm; microcalcs 10 cm span       09/09/2015 Initial Diagnosis    Right breast biopsy OUQ: DCIS with calcs and necrosis ER 95%, PR 80%; 9:30 position 5cmfn: IDC with DCIS ER 95%, PR 65%, Ki-67 20%,; right biopsy 8:30 position 3cmfn ER 95%, PR 5%, Ki-67 10%: intramammary LN with IDC      09/12/2015 Breast MRI    Multicentric right breast cancer cluster of enhancing masses 4.1 x 2.2 x 2.6 cm, LOQ: 1.1 cm enhancing mass previously biopsied, subcutaneous low right breast 2 cm mass not biopsied, multiple other small enhancing masses, 1 cm right axillary lymph node      10/01/2015 Surgery    Right mastectomy: IDC grade 2, multifocal largest 2.5 cm, with high-grade DCIS, broadly present at  anterior margin and inferior margin 2/5 LN positive, LVI Present,  Left mastectomy: ALH, ER 95%, PR 5-65%, HER-2 negative, Ki-67 10-20%, T2 N1 a stage IIB       CHIEF COMPLIANT: Follow-up after right mastectomy  INTERVAL HISTORY: Melanie Barber is a 50 year old with above-mentioned history of right breast cancer underwent bilateral mastectomies and is here to discuss the report. She is recovering fairly well from the surgery. She currently has drains in place.  REVIEW OF SYSTEMS:   Constitutional: Denies fevers, chills or abnormal weight loss Eyes: Denies blurriness of vision Ears, nose, mouth, throat, and face: Denies mucositis or sore throat Respiratory: Denies cough, dyspnea or wheezes Cardiovascular: Denies palpitation, chest  discomfort Gastrointestinal:  Denies nausea, heartburn or change in bowel habits Skin: Denies abnormal skin rashes Lymphatics: Denies new lymphadenopathy or easy bruising Neurological:Denies numbness, tingling or new weaknesses Behavioral/Psych: Mood is stable, no new changes  Extremities: No lower extremity edema Breast: Recent bilateral mastectomies with reconstruction All other systems were reviewed with the patient and are negative.  I have reviewed the past medical history, past surgical history, social history and family history with the patient and they are unchanged from previous note.  ALLERGIES:  is allergic to keflex [cephalexin].  MEDICATIONS:  Current Outpatient Prescriptions  Medication Sig Dispense Refill  . ibuprofen (ADVIL,MOTRIN) 200 MG tablet Take 400 mg by mouth every 6 (six) hours as needed for mild pain.    Marland Kitchen levonorgestrel (MIRENA) 20 MCG/24HR IUD 1 each by Intrauterine route once.    . methocarbamol (ROBAXIN) 500 MG tablet Take 1 tablet (500 mg total) by mouth every 8 (eight) hours as needed for muscle spasms. 30 tablet 0  . Multiple Vitamin (MULTIVITAMIN WITH MINERALS) TABS tablet Take 1 tablet by mouth daily.    Marland Kitchen oxyCODONE (OXY IR/ROXICODONE) 5 MG immediate release tablet Take 1-2 tablets (5-10 mg total) by mouth every 4 (four) hours as needed for moderate pain. 50 tablet 0  . sulfamethoxazole-trimethoprim (BACTRIM DS,SEPTRA DS) 800-160 MG tablet Take 1 tablet by mouth 2 (two) times daily. 12 tablet 0  . tamoxifen (NOLVADEX) 20 MG tablet Take 1 tablet (20 mg total) by mouth daily. 90 tablet 3  No current facility-administered medications for this visit.     PHYSICAL EXAMINATION: ECOG PERFORMANCE STATUS: 1 - Symptomatic but completely ambulatory  There were no vitals filed for this visit. There were no vitals filed for this visit.  GENERAL:alert, no distress and comfortable SKIN: skin color, texture, turgor are normal, no rashes or significant  lesions EYES: normal, Conjunctiva are pink and non-injected, sclera clear OROPHARYNX:no exudate, no erythema and lips, buccal mucosa, and tongue normal  NECK: supple, thyroid normal size, non-tender, without nodularity LYMPH:  no palpable lymphadenopathy in the cervical, axillary or inguinal LUNGS: clear to auscultation and percussion with normal breathing effort HEART: regular rate & rhythm and no murmurs and no lower extremity edema ABDOMEN:abdomen soft, non-tender and normal bowel sounds MUSCULOSKELETAL:no cyanosis of digits and no clubbing  NEURO: alert & oriented x 3 with fluent speech, no focal motor/sensory deficits EXTREMITIES: No lower extremity edema   LABORATORY DATA:  I have reviewed the data as listed   Chemistry      Component Value Date/Time   NA 139 09/26/2015 1330   K 3.9 09/26/2015 1330   CL 105 09/26/2015 1330   CO2 25 09/26/2015 1330   BUN 15 09/26/2015 1330   CREATININE 0.76 09/26/2015 1330      Component Value Date/Time   CALCIUM 9.3 09/26/2015 1330       Lab Results  Component Value Date   WBC 5.2 09/26/2015   HGB 13.6 09/26/2015   HCT 40.2 09/26/2015   MCV 92.0 09/26/2015   PLT 233 09/26/2015     ASSESSMENT & PLAN:  Breast cancer of upper-outer quadrant of right female breast ([place]) Bilateral mastectomies 10/01/2015 With reconstruction Right mastectomy: IDC grade 2, multifocal largest 2.5 cm, with high-grade DCIS, broadly present at  anterior margin and inferior margin 2/5 LN positive, LVI Present,   Left mastectomy: ALH, ER 95%, PR 5-65%, HER-2 negative, Ki-67 10-20%,  Pathologic stage: T2 N1 a stage IIB Mammaprint: High risk CT-CAP and bone scan: No evidence of metastatic disease ----------------------------------------------------------------------------------------------------------------------------------------------------------------- Pathology counseling: I discussed the final pathology report of the patient provided  a copy of this  report. I discussed the margins as well as lymph node surgeries. We also discussed the final staging along with previously performed ER/PR and HER-2/neu testing.  Treatment plan: 1. Because of lymph node involvement I discussed the risks and benefits of doing axillary lymph node dissection. With the advent of chemotherapy and adjuvant radiation, axillary lymph node dissection has not shown to have substantial benefit. 2. Adjuvant chemotherapy with dose dense Adriamycin and Cytoxan 4 followed by Abraxane weekly 12 ( patient is intolerant to steroids and hence we cannot use paclitaxel) 3. Followed by adjuvant radiation therapy 4. Followed by adjuvant antiestrogen therapy with tamoxifen 10 years  Plan to start chemotherapy Nov 1st  PREVENT trial: [id number]  Newly diagnosed breast cancer Stages 1-3 scheduled to receive anthracycline randomized to atorvastatin 40 mg or placebo once daily for 24 months along with 3 cardiac MRIs or 2 years paid for by the trial. I discussed the risks and benefits of atorvastatin including the risk of myopathy and elevation of liver function tests. I explained the randomization process and patient is interested in the trial. I provided her with literature to read and provided her with the contact with the clinical trials nurse to ask any questions regarding the trial.  ------------------------------------------------------------------------------------------------------------------- Consent for research study: Procurement of human bio specimens for the discovery and validation of biomarkers for the prediction, diagnosis and  management of disease  Met with patient (alone or with friend) for 15 minutes to review the pathology specimen consent form and HIPAA. Study involves a one-time blood draw. Patient was told of the voluntary nature of the study, risks, benefits, and alternatives to participation. All of the patient's questions were answered, and the patient agreed  to participate in the study. The consent and HIPAA forms were signed and dated by the patient. All eligibility criteria have been met and patient has been enrolled in the pathology procurement study. Research staff will provide the patient with a copy of the consent form and HIPAA document and give the patient a gift card after the blood has been obtained. --------------------------------------------------------------------------------------------------------------------  No orders of the defined types were placed in this encounter.  The patient has a good understanding of the overall plan. she agrees with it. she will call with any problems that may develop before the next visit here.   Rulon Eisenmenger, MD 10/09/15

## 2015-10-10 ENCOUNTER — Ambulatory Visit
Admission: RE | Admit: 2015-10-10 | Discharge: 2015-10-10 | Disposition: A | Discharge status: Home / Self Care | Payer: 59 | Source: Ambulatory Visit | Attending: Radiation Oncology | Admitting: Radiation Oncology

## 2015-10-10 ENCOUNTER — Telehealth: Payer: Self-pay | Admitting: Oncology

## 2015-10-10 ENCOUNTER — Encounter: Payer: Self-pay | Admitting: Radiation Oncology

## 2015-10-10 ENCOUNTER — Encounter: Payer: Self-pay | Admitting: Genetic Counselor

## 2015-10-10 DIAGNOSIS — Z9013 Acquired absence of bilateral breasts and nipples: Secondary | ICD-10-CM | POA: Diagnosis not present

## 2015-10-10 DIAGNOSIS — Z170 Estrogen receptor positive status [ER+]: Secondary | ICD-10-CM | POA: Diagnosis not present

## 2015-10-10 DIAGNOSIS — Z79899 Other long term (current) drug therapy: Secondary | ICD-10-CM | POA: Insufficient documentation

## 2015-10-10 DIAGNOSIS — C50411 Malignant neoplasm of upper-outer quadrant of right female breast: Secondary | ICD-10-CM | POA: Insufficient documentation

## 2015-10-10 MED FILL — LORazepam 0.5 MG TABS: 0.5 | 7 days supply | Qty: 30 | Fill #0

## 2015-10-10 NOTE — Progress Notes (Signed)
REFERRING PROVIDER: Nicholas Lose, MD  PRIMARY PROVIDER:  No PCP Per Patient  PRIMARY REASON FOR VISIT:  1. Malignant neoplasm of upper-outer quadrant of right breast in female, estrogen receptor positive ([place])   2. Family history of breast cancer in female   3. Family history of uterine cancer      HISTORY OF PRESENT ILLNESS:   Melanie Barber, a 50 y.o. female, was seen for a [facility] cancer genetics consultation at the request of Dr. Lindi Adie due to a personal history of breast cancer at 27 and family history of breast and uterine cancer.  Melanie Barber presents to clinic today with her friend to discuss the possibility of a hereditary predisposition to cancer, genetic testing, and to further clarify her future cancer risks, as well as potential cancer risks for family members.   In September 2017, at the age of 68, Melanie Barber was diagnosed with invasive ductal carcinoma with DCIS of the right breast.  Hormone receptor status was ER/PR+, Her2-.  She was also diagnosed with atypical lobular hyperplasia with fibrocystic changes and adenosis of the left breast.  This was treated with bilateral mastectomies. Melanie Barber reports no additional personal history of cancer.   CANCER HISTORY:    Breast cancer of upper-outer quadrant of right female breast ([place])   09/02/2015 Mammogram    Right breast UOQ: 2 masses 3 cm apart, U/S they measured 3.4 cm, larger mass at 1.6 cm, in addition, 1 cm mass at 9:00 and 0.9 cm mass at 8:30( could be intramammary LN); right axillary LN 11 mm; microcalcs 10 cm span       09/09/2015 Initial Diagnosis    Right breast biopsy OUQ: DCIS with calcs and necrosis ER 95%, PR 80%; 9:30 position 5cmfn: IDC with DCIS ER 95%, PR 65%, Ki-67 20%,; right biopsy 8:30 position 3cmfn ER 95%, PR 5%, Ki-67 10%: intramammary LN with IDC      09/12/2015 Breast MRI    Multicentric right breast cancer cluster of enhancing masses 4.1 x 2.2 x 2.6 cm, LOQ: 1.1 cm enhancing mass previously  biopsied, subcutaneous low right breast 2 cm mass not biopsied, multiple other small enhancing masses, 1 cm right axillary lymph node      10/01/2015 Surgery    Right mastectomy: IDC grade 2, multifocal largest 2.5 cm, with high-grade DCIS, broadly present at  anterior margin and inferior margin 2/5 LN positive, LVI Present,  Left mastectomy: ALH, ER 95%, PR 5-65%, HER-2 negative, Ki-67 10-20%, T2 N1 a stage IIB        HORMONAL RISK FACTORS:  Menarche was at age 82.  First live birth at age 63.  OCP use for approximately 10-12 years.  Ovaries intact: yes.  Hysterectomy: no.  Menopausal status: unspecified - currently using a mirena.  HRT use: 0 years. Colonoscopy: no; not examined. Mammogram within the last year: started getting at age 60; skipped the last 1-2 years because each mammogram was finding inconclusive/dense and fibrous tissue on results.  Additional history of benign right breast cyst approx 13 years ago. Number of breast biopsies: 2 Up to date with pelvic exams:  yes. Any excessive radiation exposure/other exposures in the past:  Reports history of secondhand smoke exposure (mother smoked)  Past Medical History:  Diagnosis Date  . Arthritis    right knee (10/01/2015)  . Cancer of right breast ([place])   . Migraine    "once when I was going thru a divorce; called them cluster" (10/01/2015)  . PONV (  postoperative nausea and vomiting)   . Sciatic leg pain    left leg    Past Surgical History:  Procedure Laterality Date  . BREAST BIOPSY Right   . BREAST CYST ASPIRATION Right   . BREAST RECONSTRUCTION WITH PLACEMENT OF TISSUE EXPANDER AND FLEX HD (ACELLULAR HYDRATED DERMIS) Bilateral 10/01/2015  . BREAST RECONSTRUCTION WITH PLACEMENT OF TISSUE EXPANDER AND FLEX HD (ACELLULAR HYDRATED DERMIS) Bilateral 10/01/2015   Procedure: BREAST RECONSTRUCTION WITH PLACEMENT OF TISSUE EXPANDER AND CORTIVA;  Surgeon: Irene Limbo, MD;  Location: [facility];  Service: Plastics;  Laterality:  Bilateral;  . [place]   growth removed  . MASTECTOMY Left 10/01/2015   NIPPLE SPARING  . MASTECTOMY COMPLETE / SIMPLE W/ SENTINEL NODE BIOPSY Right 10/01/2015   NIPPLE SPARING  . NASAL/SINUS ENDOSCOPY Bilateral 2005  . NIPPLE SPARING MASTECTOMY/SENTINAL LYMPH NODE BIOPSY/RECONSTRUCTION/PLACEMENT OF TISSUE EXPANDER Bilateral 10/01/2015   Procedure: BILATERAL NIPPLE SPARING MASTECTOMY WITH RIGHT SENTINAL LYMPH NODE BIOPSY;  Surgeon: Rolm Bookbinder, MD;  Location: [facility];  Service: General;  Laterality: Bilateral;  . PORTACATH PLACEMENT Right 10/01/2015  . PORTACATH PLACEMENT Right 10/01/2015   Procedure: INSERTION PORT-A-CATH;  Surgeon: Rolm Bookbinder, MD;  Location: [facility];  Service: General;  Laterality: Right;  . TOE SURGERY Left 1990   "bone spur on great toe"  . TONSILLECTOMY  1977  . ULTRASOUND GUIDANCE FOR VASCULAR ACCESS Right 10/01/2015   Procedure: ULTRASOUND GUIDANCE;  Surgeon: Rolm Bookbinder, MD;  Location: [facility];  Service: General;  Laterality: Right;  . WISDOM TOOTH EXTRACTION      Social History   Social History  . Marital status: Divorced    Spouse name: N/A  . Number of children: N/A  . Years of education: N/A   Social History Main Topics  . Smoking status: Never Smoker  . Smokeless tobacco: Never Used  . Alcohol use Yes     Comment: socially  . Drug use: No  . Sexual activity: Yes    Birth control/ protection: IUD   Other Topics Concern  . Not on file   Social History Narrative  . No narrative on file     FAMILY HISTORY:  We obtained a detailed, 4-generation family history.  Significant diagnoses are listed below: Family History  Problem Relation Age of Onset  . Depression Mother   . Inflammatory bowel disease Mother   . Alcohol abuse Mother   . Bipolar disorder Mother   . Hypertension Son   . Other Brother     severe intellectual disabilities  . Arthritis Maternal Uncle     hx knee replacement surgeries  . Stroke Maternal  Grandmother 86  . Breast cancer Maternal Grandmother     dx before menopause  . Uterine cancer Maternal Grandmother     dx late 56s  . Bipolar disorder Maternal Grandfather   . Stroke Paternal Grandmother 63  . Diabetes Paternal Grandmother     later in life  . Heart attack Paternal Grandfather     Melanie Barber has one son who is currently 71 and has never had cancer.  She has one full brother who had severe intellectual disabilities and who passed away from an "abdominal complication" at the age of 43.  Melanie Barber is not in contact with her close relatives, including her parents.  She reports that her mother is currently 54 and has not had cancer.  She does have bipolar disorder and a history of alcohol abuse.  Melanie Barber father is older  than 65, but she has no further information for him.  Melanie Barber has a younger paternal half-brother for whom she has no further information.  Melanie Barber mother has one full brother who is currently 69 and has no cancer history.  He has a history of arthritis, but is otherwise health.  He has three daughters, all in their mid-20s-early 106s.  All of these cousins are cancer-free. Melanie Barber maternal grandmother was diagnosed with breast cancer prior to menopause.  She was also diagnosed with uterine cancer in her late 30s.  She died of a sudden stroke at 61.  This grandmother had one sister who died at a much older age and never had cancer.  Melanie Barber maternal grandfather committed suicide when she was 48 years old.  He had a history of bipolar disorder.  He had four brothers, all of whom died at older ages and never had cancer.    Melanie Barber paternal aunt is in her mid-62s and has not had cancer.  She has one son and one daughter who are also cancer-free. Melanie Barber paternal grandmother died of a stroke at 72 and had a history of adult-onset diabetes.  Melanie Barber paternal grandfather died of a massive heart attack at an unspecified age.  She  has no further information for any paternal great aunts/uncles and great grandparents.  Melanie Barber is unaware of any previous family history of genetic testing for hereditary cancer risks.  Patient's maternal ancestors are of Caucasian/English descent, and paternal ancestors are of Native [place] and Caucasian descent. There is no reported Ashkenazi Jewish ancestry. There is no known consanguinity.  GENETIC COUNSELING ASSESSMENT: Melanie Barber is a 50 y.o. female with a personal and family history of breast cancer which is somewhat suggestive of a hereditary breast cancer syndrome and predisposition to cancer. We, therefore, discussed and recommended the following at today's visit.   DISCUSSION: We reviewed the characteristics, features and inheritance patterns of hereditary cancer syndromes, particularly those caused by mutations within the BRCA1/2 genes. We also discussed genetic testing, including the appropriate family members to test, the process of testing, insurance coverage and turn-around-time for results. We discussed the implications of a negative, positive and/or variant of uncertain significant result. Melanie Barber is also concerned about her grandmother's additional history of uterine cancer.  Thus, we recommended Melanie Barber pursue genetic testing for the 42-gene [facility] Common Hereditary Cancers Panel (Breast, Gyn, GI) through [facility].  The 42-gene [facility] Common Hereditary Cancers Panel (Breast, Gyn, GI) performed by [facility] [place]) includes sequencing and/or deletion/duplication analysis for the following genes: APC, ATM, AXIN2, BARD1, BMPR1A, BRCA1, BRCA2, BRIP1, CDH1, CDKN2A, CHEK2, DICER1, EPCAM, GREM1, KIT, MEN1, MLH1, MSH2, MSH6, MUTYH, NBN, NF1, PALB2, PDGFRA, PMS2, POLD1, POLE, PTEN, RAD50, RAD51C, RAD51D, SDHA, SDHB, SDHC, SDHD, SMAD4, SMARCA4, STK11, TP53, TSC1, TSC2, and VHL.   Based on Melanie Barber. Hack[place]'s personal and family history of cancer,  she meets medical criteria for genetic testing. Despite that she meets criteria, she may still have an out of pocket cost. We discussed that if her out of pocket cost for testing is over $100, the laboratory will call and confirm whether she wants to proceed with testing.  If the out of pocket cost of testing is less than $100 she will be billed by the genetic testing laboratory.   PLAN: After considering the risks, benefits, and limitations, Melanie Barber. Flis  provided informed consent to pursue genetic testing and the blood sample was sent to  [facility] for analysis of the 42-gene [facility] Common Hereditary Cancers Panel (Breast, Gyn, GI). Results should be available within approximately 2-3 weeks' time, at which point they will be disclosed by telephone to Melanie Barber. [place], as will any additional recommendations warranted by these results. Melanie Barber. [place] will receive a summary of her genetic counseling visit and a copy of her results once available. This information will also be available in Epic. We encouraged Melanie Barber. [place] to remain in contact with cancer genetics annually so that we can continuously update the family history and inform her of any changes in cancer genetics and testing that may be of benefit for her family. Melanie Barber. [place] questions were answered to her satisfaction today. Our contact information was provided should additional questions or concerns arise.  Thank you for the referral and allowing [place] to share in the care of your patient.   Jeanine Luz, Melanie Barber, [facility] Certified Genetic Counselor [email or web address] Phone: [phone]  The patient was seen for a total of 60 minutes in face-to-face genetic counseling.  This patient was discussed with Drs. Magrinat, Lindi Adie and/or Burr Medico who agrees with the above.    _______________________________________________________________________ For Office Staff:  Number of people involved in session: 3 Was an Intern/ student involved with case:  yes

## 2015-10-13 ENCOUNTER — Telehealth: Payer: Self-pay | Admitting: *Deleted

## 2015-10-13 NOTE — Telephone Encounter (Signed)
Scheduled chemo class on 10/18 at 1000. Left vm with appt date and time.

## 2015-10-13 NOTE — Progress Notes (Signed)
Radiation Oncology         [phone] ________________________________  Name: Melanie Barber MRN: [id number]  Date: 10/10/2015  DOB: Jul 11, 1965  CC:No PCP Per Patient  Rolm Bookbinder, MD     REFERRING PHYSICIAN: Rolm Bookbinder, MD   DIAGNOSIS: The encounter diagnosis was Malignant neoplasm of upper-outer quadrant of right breast in female, estrogen receptor positive ([place]).   [id number] IDC right breast   HISTORY OF PRESENT ILLNESS::Melanie Barber is a 50 y.o. female who is seen for an initial consultation visit regarding the patient's diagnosis of breast cancer.  The patient was found to have multifocal right-sided breast cancer. Routine screening mammogram detected multiple abnormalities within the right breast. The patient underwent biopsy of a right breast mass as well as an apparent intramammary lymph node along with an area of microcalcifications which also were suspicious. The microcalcifications returned positive for ductal carcinoma in situ. The breast mass and intramammary lymph node were both positive for invasive ductal carcinoma. Receptor studies indicated that the invasive disease was ER positive, PR positive, and HER-2/neu negative. This represented grade 2 invasive ductal carcinoma. The patient then subsequently underwent a breast MRI scan which demonstrated multifocal involvement of the right breast. This primarily occupied the upper outer and lower outer quadrants. A abnormal appearing slightly sub-pectoral right axillary lymph node was also seen. No MRI evidence of malignancy within the left breast.   The patient decided to proceed with a bilateral mastectomy with reconstruction. This was completed on 10/01/2015. Left simple mastectomy did not reveal any evidence of malignancy.  Right mastectomy revealed an invasive ductal carcinoma, grade 2, with multiple foci. The largest focus measured 2.5 cm. High-grade ductal carcinoma in situ with calcifications were also  present. Invasive carcinoma was felt to be broadly present at the anterior margin. A total of 5 sentinel lymph nodes were removed with 2 revealing carcinoma. This therefore represented pathologically T2 N1 a M0 disease.  The patient has seen medical oncology postoperatively who has discussed proceeding with adjuvant chemotherapy. I been asked to see the patient for consideration of postmastectomy radiation treatment.      PREVIOUS RADIATION THERAPY: No   PAST MEDICAL HISTORY:  has a past medical history of Arthritis; Cancer of right breast ([place]); Migraine; PONV (postoperative nausea and vomiting); and Sciatic leg pain.     PAST SURGICAL HISTORY: Past Surgical History:  Procedure Laterality Date  . BREAST BIOPSY Right   . BREAST CYST ASPIRATION Right   . BREAST RECONSTRUCTION WITH PLACEMENT OF TISSUE EXPANDER AND FLEX HD (ACELLULAR HYDRATED DERMIS) Bilateral 10/01/2015  . BREAST RECONSTRUCTION WITH PLACEMENT OF TISSUE EXPANDER AND FLEX HD (ACELLULAR HYDRATED DERMIS) Bilateral 10/01/2015   Procedure: BREAST RECONSTRUCTION WITH PLACEMENT OF TISSUE EXPANDER AND CORTIVA;  Surgeon: Irene Limbo, MD;  Location: [facility];  Service: Plastics;  Laterality: Bilateral;  . [place]   growth removed  . MASTECTOMY Left 10/01/2015   NIPPLE SPARING  . MASTECTOMY COMPLETE / SIMPLE W/ SENTINEL NODE BIOPSY Right 10/01/2015   NIPPLE SPARING  . NASAL/SINUS ENDOSCOPY Bilateral 2005  . NIPPLE SPARING MASTECTOMY/SENTINAL LYMPH NODE BIOPSY/RECONSTRUCTION/PLACEMENT OF TISSUE EXPANDER Bilateral 10/01/2015   Procedure: BILATERAL NIPPLE SPARING MASTECTOMY WITH RIGHT SENTINAL LYMPH NODE BIOPSY;  Surgeon: Rolm Bookbinder, MD;  Location: [facility];  Service: General;  Laterality: Bilateral;  . PORTACATH PLACEMENT Right 10/01/2015  . PORTACATH PLACEMENT Right 10/01/2015   Procedure: INSERTION PORT-A-CATH;  Surgeon: Rolm Bookbinder, MD;  Location: [facility];  Service: General;  Laterality: Right;  .  TOE SURGERY  Left 1990   "bone spur on great toe"  . TONSILLECTOMY  1977  . ULTRASOUND GUIDANCE FOR VASCULAR ACCESS Right 10/01/2015   Procedure: ULTRASOUND GUIDANCE;  Surgeon: Rolm Bookbinder, MD;  Location: [facility];  Service: General;  Laterality: Right;  . WISDOM TOOTH EXTRACTION       FAMILY HISTORY: family history includes Alcohol abuse in her mother; Arthritis in her maternal uncle; Bipolar disorder in her maternal grandfather and mother; Breast cancer in her maternal grandmother; Depression in her mother; Diabetes in her paternal grandmother; Heart attack in her paternal grandfather; Hypertension in her son; Inflammatory bowel disease in her mother; Other in her brother; Stroke (age of onset: 65) in her paternal grandmother; Stroke (age of onset: 30) in her maternal grandmother; Uterine cancer in her maternal grandmother.   SOCIAL HISTORY:  reports that she has never smoked. She has never used smokeless tobacco. She reports that she drinks alcohol. She reports that she does not use drugs.   ALLERGIES: Keflex [cephalexin]   MEDICATIONS:  Current Outpatient Prescriptions  Medication Sig Dispense Refill  . levonorgestrel (MIRENA) 20 MCG/24HR IUD 1 each by Intrauterine route once.    . lidocaine-prilocaine (EMLA) cream Apply to affected area once 30 g 3  . methocarbamol (ROBAXIN) 500 MG tablet Take 1 tablet (500 mg total) by mouth every 8 (eight) hours as needed for muscle spasms. 30 tablet 0  . ibuprofen (ADVIL,MOTRIN) 200 MG tablet Take 400 mg by mouth every 6 (six) hours as needed for mild pain.    Marland Kitchen LORazepam (ATIVAN) 0.5 MG tablet Take 1 tablet (0.5 mg total) by mouth every 6 (six) hours as needed (Nausea or vomiting). (Patient not taking: Reported on 10/10/2015) 30 tablet 0  . Multiple Vitamin (MULTIVITAMIN WITH MINERALS) TABS tablet Take 1 tablet by mouth daily.    . ondansetron (ZOFRAN) 8 MG tablet Take 1 tablet (8 mg total) by mouth 2 (two) times daily as needed. Start on the third day after  chemotherapy. (Patient not taking: Reported on 10/10/2015) 30 tablet 1  . oxyCODONE (OXY IR/ROXICODONE) 5 MG immediate release tablet Take 1-2 tablets (5-10 mg total) by mouth every 4 (four) hours as needed for moderate pain. (Patient not taking: Reported on 10/10/2015) 50 tablet 0  . prochlorperazine (COMPAZINE) 10 MG tablet Take 1 tablet (10 mg total) by mouth every 6 (six) hours as needed (Nausea or vomiting). (Patient not taking: Reported on 10/10/2015) 30 tablet 1  . sulfamethoxazole-trimethoprim (BACTRIM DS,SEPTRA DS) 800-160 MG tablet Take 1 tablet by mouth 2 (two) times daily. (Patient not taking: Reported on 10/10/2015) 12 tablet 0  . tamoxifen (NOLVADEX) 20 MG tablet Take 1 tablet (20 mg total) by mouth daily. (Patient not taking: Reported on 10/10/2015) 90 tablet 3   No current facility-administered medications for this encounter.      REVIEW OF SYSTEMS:  A 15 point review of systems is documented in the electronic medical record. This was obtained by the nursing staff. However, I reviewed this with the patient to discuss relevant findings and make appropriate changes.  Pertinent items are noted in HPI.    PHYSICAL EXAM:  height is _0  (1.702 m) and weight is 186 lb 12.8 oz (84.7 kg). Her oral temperature is 98.6 F (37 C). Her blood pressure is 105/79 and her pulse is 95. Her respiration is 20.   ECOG = 1  0 - Asymptomatic (Fully active, able to carry on all predisease activities without restriction)  1 -  Symptomatic but completely ambulatory (Restricted in physically strenuous activity but ambulatory and able to carry out work of a light or sedentary nature. For example, light housework, office work)  2 - Symptomatic, <50% in bed during the day (Ambulatory and capable of all self care but unable to carry out any work activities. Up and about more than 50% of waking hours)  3 - Symptomatic, >50% in bed, but not bedbound (Capable of only limited self-care, confined to bed or chair 50%  or more of waking hours)  4 - Bedbound (Completely disabled. Cannot carry on any self-care. Totally confined to bed or chair)  5 - Death   Eustace Pen MM, Creech RH, Tormey DC, et al. [phone]). "Toxicity and response criteria of the [facility]". [place] Oncol. 5 (6): 649-55  General: Well-developed, in no acute distress HEENT: Normocephalic, atraumatic; oral cavity clear Neck: Supple without any lymphadenopathy Cardiovascular: Regular rate and rhythm Respiratory: Clear to auscultation bilaterally Breasts: Deferred with the patient undergoing recent breast exams with Gen. surgery and plastic surgery  GI: Soft, nontender, normal bowel sounds Extremities: No edema present Neuro: No focal deficits     LABORATORY DATA:  Lab Results  Component Value Date   WBC 5.2 09/26/2015   HGB 13.6 09/26/2015   HCT 40.2 09/26/2015   MCV 92.0 09/26/2015   PLT 233 09/26/2015   Lab Results  Component Value Date   NA 139 09/26/2015   K 3.9 09/26/2015   CL 105 09/26/2015   CO2 25 09/26/2015   No results found for: ALT, AST, GGT, ALKPHOS, BILITOT    RADIOGRAPHY: Ct Chest W Contrast  Result Date: 09/18/2015 CLINICAL DATA:  Newly diagnosed breast cancer. Multi centric lesions in the right breast on recent MRI. EXAM: CT CHEST, ABDOMEN, AND PELVIS WITH CONTRAST TECHNIQUE: Multidetector CT imaging of the chest, abdomen and pelvis was performed following the standard protocol during bolus administration of intravenous contrast. CONTRAST:  182m ISOVUE-300 IOPAMIDOL (ISOVUE-300) INJECTION 61% COMPARISON:  Breast MRI 09/12/2015 FINDINGS: CT CHEST FINDINGS Chest wall: No left-sided breast lesions are identified. Multiple small lesions noted in the right breast. A biopsy clip is noted in the lesion which is the deep lateral breast. No enlarged axillary or supraclavicular lymph nodes. Small scattered lymph nodes are noted. The thyroid gland is normal. Cardiovascular: The heart is normal in  size. No pericardial effusion. The aorta is normal in caliber. No dissection. Mediastinum/Nodes: No mediastinal or hilar mass or lymphadenopathy. Esophagus is normal. Lungs/Pleura: No pulmonary lesions to suggest metastatic disease. No acute pulmonary findings. No pleural effusion. No pleural nodules. Musculoskeletal: No significant bony findings. No worrisome bone lesions to suggest metastatic disease. CT ABDOMEN PELVIS FINDINGS Hepatobiliary: No focal hepatic lesions or intrahepatic biliary dilatation. Tiny low-attenuation lesion in the left hepatic lobe measures 15 Hounsfield units and is consistent with a small cyst. The gallbladder is normal. No common bile duct dilatation. Pancreas: No mass, inflammation or ductal dilatation. Spleen: Normal size.  No focal lesions. Adrenals/Urinary Tract: The adrenal glands and kidneys are normal. Stomach/Bowel: The stomach, duodenum, small bowel and colon are unremarkable. No inflammatory changes, mass lesions or obstructive findings. The terminal ileum is normal. Vascular/Lymphatic: The aorta and branch vessels are normal. The major venous structures are patent. No mesenteric or retroperitoneal mass or adenopathy. Reproductive: The uterus and ovaries are normal. Not IUD is noted in endometrial canal. No complicating features. Other: No pelvic mass or adenopathy. No free pelvic fluid collections. No inguinal mass or adenopathy.  No abdominal wall hernia or subcutaneous lesions. Musculoskeletal: No significant bony findings. No lytic or sclerotic bone lesions to suggest metastatic disease. IMPRESSION: 1. Small right breast lesions but no axillary or supraclavicular lymphadenopathy. 2. No findings for metastatic disease involving the chest, abdomen, pelvis or bony structures. Electronically Signed   By: Marijo Sanes M.D.   On: 09/18/2015 17:25   Nm Bone Scan Whole Body  Result Date: 09/18/2015 CLINICAL DATA:  New diagnosed RIGHT breast cancer, history of arthritis RIGHT  knee EXAM: NUCLEAR MEDICINE WHOLE BODY BONE SCAN TECHNIQUE: Whole body anterior and posterior images were obtained approximately 3 hours after intravenous injection of radiopharmaceutical. RADIOPHARMACEUTICALS:  19.8 mCi Technetium-22mMDP IV COMPARISON:  None new line radiographic correlation: CT chest abdomen pelvis 09/18/2015 FINDINGS: Uptake at knees bilaterally RIGHT greater than LEFT likely degenerative. No additional sites of abnormal osseous tracer accumulation identified. Nonspecific increased tracer localization within the RIGHT breast, may be related to preceding surgery or breast tumor. Small focus of tracer accumulation at the soft tissues of the medial proximal LEFT forearm question injection site. Expected urinary tract and soft tissue distribution of tracer otherwise seen. IMPRESSION: No scintigraphic evidence of osseous metastatic disease. Electronically Signed   By: MLavonia DanaM.D.   On: 09/18/2015 16:43   Ct Abdomen Pelvis W Contrast  Result Date: 09/18/2015 CLINICAL DATA:  Newly diagnosed breast cancer. Multi centric lesions in the right breast on recent MRI. EXAM: CT CHEST, ABDOMEN, AND PELVIS WITH CONTRAST TECHNIQUE: Multidetector CT imaging of the chest, abdomen and pelvis was performed following the standard protocol during bolus administration of intravenous contrast. CONTRAST:  1047mISOVUE-300 IOPAMIDOL (ISOVUE-300) INJECTION 61% COMPARISON:  Breast MRI 09/12/2015 FINDINGS: CT CHEST FINDINGS Chest wall: No left-sided breast lesions are identified. Multiple small lesions noted in the right breast. A biopsy clip is noted in the lesion which is the deep lateral breast. No enlarged axillary or supraclavicular lymph nodes. Small scattered lymph nodes are noted. The thyroid gland is normal. Cardiovascular: The heart is normal in size. No pericardial effusion. The aorta is normal in caliber. No dissection. Mediastinum/Nodes: No mediastinal or hilar mass or lymphadenopathy. Esophagus is  normal. Lungs/Pleura: No pulmonary lesions to suggest metastatic disease. No acute pulmonary findings. No pleural effusion. No pleural nodules. Musculoskeletal: No significant bony findings. No worrisome bone lesions to suggest metastatic disease. CT ABDOMEN PELVIS FINDINGS Hepatobiliary: No focal hepatic lesions or intrahepatic biliary dilatation. Tiny low-attenuation lesion in the left hepatic lobe measures 15 Hounsfield units and is consistent with a small cyst. The gallbladder is normal. No common bile duct dilatation. Pancreas: No mass, inflammation or ductal dilatation. Spleen: Normal size.  No focal lesions. Adrenals/Urinary Tract: The adrenal glands and kidneys are normal. Stomach/Bowel: The stomach, duodenum, small bowel and colon are unremarkable. No inflammatory changes, mass lesions or obstructive findings. The terminal ileum is normal. Vascular/Lymphatic: The aorta and branch vessels are normal. The major venous structures are patent. No mesenteric or retroperitoneal mass or adenopathy. Reproductive: The uterus and ovaries are normal. Not IUD is noted in endometrial canal. No complicating features. Other: No pelvic mass or adenopathy. No free pelvic fluid collections. No inguinal mass or adenopathy. No abdominal wall hernia or subcutaneous lesions. Musculoskeletal: No significant bony findings. No lytic or sclerotic bone lesions to suggest metastatic disease. IMPRESSION: 1. Small right breast lesions but no axillary or supraclavicular lymphadenopathy. 2. No findings for metastatic disease involving the chest, abdomen, pelvis or bony structures. Electronically Signed   By: P.Ricky Stabs.  On: 09/18/2015 17:25   Nm Sentinel Node Inj-no Rpt (breast)  Result Date: 10/01/2015 CLINICAL DATA: right axillary sentinel node biopsy Sulfur colloid was injected intradermally by the nuclear medicine technologist for breast cancer sentinel node localization.   Dg Chest Port 1 View  Result Date:  10/01/2015 CLINICAL DATA:  Port-A-Cath placement, new diagnosis of breast carcinoma on the right EXAM: PORTABLE CHEST 1 VIEW COMPARISON:  CT chest of 09/18/2015 FINDINGS: There is parenchymal opacity at the right lung base most consistent with atelectasis with some depression of the right minor fissure. Minimal haziness is also present in the left perihilar region which could represent atelectasis. Pneumonia cannot be excluded. A right-sided Port-A-Cath is present with the tip overlying the lower SVC. No pneumothorax is seen. The heart is within upper limits of normal. IMPRESSION: 1. Right-sided Port-A-Cath tip overlies the lower SVC. No pneumothorax. 2. Probable areas of atelectasis bilaterally. Pneumonia cannot be excluded. Electronically Signed   By: Ivar Drape M.D.   On: 10/01/2015 14:19   Dg Fluoro Guide Cv Line-no Report  Result Date: 10/01/2015 CLINICAL DATA:  FLOURO GUIDE CV LINE Fluoroscopy was utilized by the requesting physician.  No radiographic interpretation.       IMPRESSION:    Breast cancer of upper-outer quadrant of right female breast ([place]) (Resolved)   09/02/2015 Mammogram    Right breast UOQ: 2 masses 3 cm apart, U/S they measured 3.4 cm, larger mass at 1.6 cm, in addition, 1 cm mass at 9:00 and 0.9 cm mass at 8:30( could be intramammary LN); right axillary LN 11 mm; microcalcs 10 cm span       09/09/2015 Initial Diagnosis    Right breast biopsy OUQ: DCIS with calcs and necrosis ER 95%, PR 80%; 9:30 position 5cmfn: IDC with DCIS ER 95%, PR 65%, Ki-67 20%,; right biopsy 8:30 position 3cmfn ER 95%, PR 5%, Ki-67 10%: intramammary LN with IDC      09/12/2015 Breast MRI    Multicentric right breast cancer cluster of enhancing masses 4.1 x 2.2 x 2.6 cm, LOQ: 1.1 cm enhancing mass previously biopsied, subcutaneous low right breast 2 cm mass not biopsied, multiple other small enhancing masses, 1 cm right axillary lymph node      10/01/2015 Surgery    Right mastectomy: IDC grade 2,  multifocal largest 2.5 cm, with high-grade DCIS, broadly present at  anterior margin and inferior margin 2/5 LN positive, LVI Present,  Left mastectomy: ALH, ER 95%, PR 5-65%, HER-2 negative, Ki-67 10-20%, T2 N1 a stage IIB       The patient has a recent diagnosis of Invasive ductal carcinoma of the right, status post bilateral mastectomy with malignancy only on the right.  I discussed with the patient the role of adjuvant radiation treatment in this setting. We discussed the potential benefit of radiation treatment, especially with regards to local control of the patient's tumor. We also discussed the possible side effects and risks of such a treatment as well. I believe that the patient is an appropriate candidate for postmastectomy radiation treatment given her presentation, especially with multiple lymph nodes present. We discussed the pros and cons of axillary lymph node dissection is well and the patient stated that she does not wish to proceed with this.  We also discussed the possible interaction of reconstruction with radiation treatment including an elevated risk for long-term side effects.  All of the patient's questions were answered. The patient wishes to proceed with radiation treatment at the appropriate time.  PLAN: I look forward to seeing the patient Towards the end of her planned course of chemotherapy to review her case, and to coordinate an anticipated course of postmastectomy radiation treatment.      ________________________________   Jodelle Gross, MD, PhD   **Disclaimer: This note was dictated with voice recognition software. Similar sounding words can inadvertently be transcribed and this note may contain transcription errors which may not have been corrected upon publication of note.**

## 2015-10-15 ENCOUNTER — Other Ambulatory Visit: Payer: 59

## 2015-10-16 ENCOUNTER — Encounter: Payer: 59 | Admitting: Genetic Counselor

## 2015-10-16 ENCOUNTER — Other Ambulatory Visit: Payer: 59

## 2015-10-17 ENCOUNTER — Other Ambulatory Visit: Payer: Self-pay | Admitting: *Deleted

## 2015-10-17 DIAGNOSIS — Z760 Encounter for issue of repeat prescription: Secondary | ICD-10-CM | POA: Diagnosis not present

## 2015-10-17 DIAGNOSIS — C50919 Malignant neoplasm of unspecified site of unspecified female breast: Secondary | ICD-10-CM | POA: Diagnosis not present

## 2015-10-17 DIAGNOSIS — L658 Other specified nonscarring hair loss: Secondary | ICD-10-CM | POA: Diagnosis not present

## 2015-10-17 DIAGNOSIS — C50411 Malignant neoplasm of upper-outer quadrant of right female breast: Secondary | ICD-10-CM

## 2015-10-17 DIAGNOSIS — Z170 Estrogen receptor positive status [ER+]: Principal | ICD-10-CM

## 2015-10-20 ENCOUNTER — Encounter: Payer: Self-pay | Admitting: Hematology and Oncology

## 2015-10-22 ENCOUNTER — Encounter: Payer: Self-pay | Admitting: *Deleted

## 2015-10-22 ENCOUNTER — Other Ambulatory Visit: Payer: Self-pay | Admitting: *Deleted

## 2015-10-22 ENCOUNTER — Other Ambulatory Visit: Payer: 59

## 2015-10-22 ENCOUNTER — Encounter: Payer: 59 | Admitting: *Deleted

## 2015-10-22 DIAGNOSIS — C50411 Malignant neoplasm of upper-outer quadrant of right female breast: Secondary | ICD-10-CM

## 2015-10-22 DIAGNOSIS — Z170 Estrogen receptor positive status [ER+]: Principal | ICD-10-CM

## 2015-10-22 MED ORDER — INV-ATORVASTATIN/PLACEBO 40 MG TABS WAKE FOREST WF 98213: 1.0000 | ORAL_TABLET | Freq: Every day | ORAL | 0 refills | Status: DC

## 2015-10-24 ENCOUNTER — Telehealth: Payer: Self-pay | Admitting: Genetic Counselor

## 2015-10-24 DIAGNOSIS — C50911 Malignant neoplasm of unspecified site of right female breast: Secondary | ICD-10-CM | POA: Diagnosis not present

## 2015-10-24 DIAGNOSIS — Z9012 Acquired absence of left breast and nipple: Secondary | ICD-10-CM | POA: Diagnosis not present

## 2015-10-24 NOTE — Telephone Encounter (Signed)
Discussed with Melanie Barber that her genetic test results were negative for known pathogenic mutations within any of 42 genes on the [facility] Common Hereditary Cancers Panel (Breast, Gyn, GI) through [employer].  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  Discussed that we treat this just like a negative test result until it gets updated by the lab and reviewed why we do that.  Encouraged her to keep her phone number up to date with [facility].  She should continue to follow her doctors' recommendations for future cancer screening.  She is welcome to call with any questions.  She would like a copy of her results mailed to her.

## 2015-10-28 ENCOUNTER — Telehealth: Payer: Self-pay | Admitting: *Deleted

## 2015-10-28 ENCOUNTER — Ambulatory Visit: Payer: Self-pay | Admitting: Genetic Counselor

## 2015-10-28 DIAGNOSIS — Z1379 Encounter for other screening for genetic and chromosomal anomalies: Secondary | ICD-10-CM

## 2015-10-28 DIAGNOSIS — Z170 Estrogen receptor positive status [ER+]: Secondary | ICD-10-CM | POA: Diagnosis not present

## 2015-10-28 DIAGNOSIS — Z8049 Family history of malignant neoplasm of other genital organs: Secondary | ICD-10-CM

## 2015-10-28 DIAGNOSIS — C50411 Malignant neoplasm of upper-outer quadrant of right female breast: Secondary | ICD-10-CM

## 2015-10-28 DIAGNOSIS — M25369 Other instability, unspecified knee: Secondary | ICD-10-CM | POA: Diagnosis not present

## 2015-10-28 DIAGNOSIS — Z803 Family history of malignant neoplasm of breast: Secondary | ICD-10-CM

## 2015-10-28 DIAGNOSIS — M25569 Pain in unspecified knee: Secondary | ICD-10-CM | POA: Diagnosis not present

## 2015-10-28 DIAGNOSIS — M549 Dorsalgia, unspecified: Secondary | ICD-10-CM | POA: Diagnosis not present

## 2015-10-28 DIAGNOSIS — C50811 Malignant neoplasm of overlapping sites of right female breast: Secondary | ICD-10-CM | POA: Diagnosis not present

## 2015-10-28 DIAGNOSIS — M1710 Unilateral primary osteoarthritis, unspecified knee: Secondary | ICD-10-CM | POA: Diagnosis not present

## 2015-10-28 NOTE — Telephone Encounter (Signed)
Received notification from pt concerning cranial prosthesis that insurance would cover and needed CPT and ICD code. I called [facility] and was informed that her policy was that she would receive a cranial prosthesis post chemo, xrt or burn. Discussed with with representative that she would lose hair during chemo and needed it prior to completion of chemotherapy. Was informed by repetitive to send notes and prescription to pre-determination department.  Sent script and notes to pre-determination office at [phone] on 10/27/15 and 10/28/15

## 2015-11-03 ENCOUNTER — Encounter: Payer: Self-pay | Admitting: *Deleted

## 2015-11-03 DIAGNOSIS — C50911 Malignant neoplasm of unspecified site of right female breast: Secondary | ICD-10-CM | POA: Diagnosis not present

## 2015-11-03 DIAGNOSIS — C50411 Malignant neoplasm of upper-outer quadrant of right female breast: Secondary | ICD-10-CM | POA: Diagnosis not present

## 2015-11-04 ENCOUNTER — Encounter: Payer: Self-pay | Admitting: Physical Therapy

## 2015-11-04 ENCOUNTER — Ambulatory Visit: Payer: 59 | Attending: Plastic Surgery | Admitting: Physical Therapy

## 2015-11-04 DIAGNOSIS — M25612 Stiffness of left shoulder, not elsewhere classified: Secondary | ICD-10-CM | POA: Diagnosis not present

## 2015-11-04 DIAGNOSIS — M25611 Stiffness of right shoulder, not elsewhere classified: Secondary | ICD-10-CM | POA: Insufficient documentation

## 2015-11-04 DIAGNOSIS — R600 Localized edema: Secondary | ICD-10-CM | POA: Insufficient documentation

## 2015-11-04 NOTE — Therapy (Signed)
[facility] [address] [place], [place], [address] Phone: [phone]   Fax:  [phone]  Physical Therapy Evaluation  Patient Details  Name: Melanie Barber MRN: [id number] Date of Birth: 03/06/1965 Referring Provider: Thimmappa  Encounter Date: 11/04/2015      PT End of Session - 11/04/15 1153    Visit Number 1   Number of Visits 9   Date for PT Re-Evaluation 12/02/15   PT Start Time 1106   PT Stop Time 1147   PT Time Calculation (min) 41 min   Activity Tolerance Patient tolerated treatment well   Behavior During Therapy WFL for tasks assessed/performed      Past Medical History:  Diagnosis Date  . Arthritis    right knee (10/01/2015)  . Cancer of right breast (HCC)   . Migraine    "once when I was going thru a divorce; called them cluster" (10/01/2015)  . PONV (postoperative nausea and vomiting)   . Sciatic leg pain    left leg    Past Surgical History:  Procedure Laterality Date  . BREAST BIOPSY Right   . BREAST CYST ASPIRATION Right   . BREAST RECONSTRUCTION WITH PLACEMENT OF TISSUE EXPANDER AND FLEX HD (ACELLULAR HYDRATED DERMIS) Bilateral 10/01/2015  . BREAST RECONSTRUCTION WITH PLACEMENT OF TISSUE EXPANDER AND FLEX HD (ACELLULAR HYDRATED DERMIS) Bilateral 10/01/2015   Procedure: BREAST RECONSTRUCTION WITH PLACEMENT OF TISSUE EXPANDER AND CORTIVA;  Surgeon: Brinda Thimmappa, MD;  Location: [facility] OR;  Service: Plastics;  Laterality: Bilateral;  . CERVIX SURGERY  1993   growth removed  . MASTECTOMY Left 10/01/2015   NIPPLE SPARING  . MASTECTOMY COMPLETE / SIMPLE W/ SENTINEL NODE BIOPSY Right 10/01/2015   NIPPLE SPARING  . NASAL/SINUS ENDOSCOPY Bilateral 2005  . NIPPLE SPARING MASTECTOMY/SENTINAL LYMPH NODE BIOPSY/RECONSTRUCTION/PLACEMENT OF TISSUE EXPANDER Bilateral 10/01/2015   Procedure: BILATERAL NIPPLE SPARING MASTECTOMY WITH RIGHT SENTINAL LYMPH NODE BIOPSY;  Surgeon: Matthew Wakefield, MD;   Location: [facility] OR;  Service: General;  Laterality: Bilateral;  . PORTACATH PLACEMENT Right 10/01/2015  . PORTACATH PLACEMENT Right 10/01/2015   Procedure: INSERTION PORT-A-CATH;  Surgeon: Matthew Wakefield, MD;  Location: [facility] OR;  Service: General;  Laterality: Right;  . TOE SURGERY Left 1990   "bone spur on great toe"  . TONSILLECTOMY  1977  . ULTRASOUND GUIDANCE FOR VASCULAR ACCESS Right 10/01/2015   Procedure: ULTRASOUND GUIDANCE;  Surgeon: Matthew Wakefield, MD;  Location: [facility] OR;  Service: General;  Laterality: Right;  . WISDOM TOOTH EXTRACTION      There were no vitals filed for this visit.       Subjective Assessment - 11/04/15 1113    Subjective My breast cancer was in the right side but I had a double mastectomy. I had a sentinel lymph node dissection (5). I am having trouble doing anything overhead and my swelling is worse on the right side in my chest.    Pertinent History Right mastectomy: IDC grade 2, multifocal largest 2.5 cm, with high-grade DCIS, broadly present at  anterior margin and inferior margin 2/5 LN positive, LVI Present,  Left mastectomy: ALH, ER 95%, PR 5-65%, HER-2 negative, Ki-67 10-20%, T2 N1 a stage IIB, patient to begin chemotherapy 11/05/15 then radiation once chemo is completed in approx 5 months   Patient Stated Goals improve ROM   Currently in Pain? No/denies            [facility] PT Assessment - 11/04/15 0001      Assessment   Medical Diagnosis   right breast cancer   Referring Provider Thimmappa   Onset Date/Surgical Date 10/01/15   Hand Dominance Right   Prior Therapy none     Precautions   Precautions Other (comment)  lymphedema, cancer     Restrictions   Weight Bearing Restrictions No     Balance Screen   Has the patient fallen in the past 6 months No   Has the patient had a decrease in activity level because of a fear of falling?  No   Is the patient reluctant to leave their home because of a fear of falling?  No     Home Environment    Living Environment Private residence   Living Arrangements Spouse/significant other   Available Help at Discharge Family   Type of Home House   Home Access Stairs to enter   Entrance Stairs-Number of Steps 4   Entrance Stairs-Rails Left;Right   Home Layout Two level   Alternate Level Stairs-Number of Steps 14   Alternate Level Stairs-Rails Right   Home Equipment None     Prior Function   Level of Independence Independent   Vocation Other (comment)  FMLA, nurse in critical care    Vocation Requirements lots of lifting, pulling, transfers patients   Leisure pt walks 3-4 miles a day     Cognition   Overall Cognitive Status Within Functional Limits for tasks assessed     AROM   Right Shoulder Flexion 126 Degrees   Right Shoulder ABduction 100 Degrees   Right Shoulder Internal Rotation 52 Degrees   Right Shoulder External Rotation 90 Degrees   Left Shoulder Flexion 146 Degrees   Left Shoulder ABduction 111 Degrees   Left Shoulder Internal Rotation 69 Degrees   Left Shoulder External Rotation 80 Degrees           LYMPHEDEMA/ONCOLOGY QUESTIONNAIRE - 11/04/15 1125      Type   Cancer Type right breast      Surgeries   Mastectomy Date 10/01/15   Sentinel Lymph Node Biopsy Date 10/01/15   Number Lymph Nodes Removed 5     Date Lymphedema/Swelling Started   Date 10/01/15     Treatment   Active Chemotherapy Treatment Yes  pt to begin tomorrow   Date 11/05/15   Active Radiation Treatment No  pt to begin after chemo   Past Radiation Treatment No   Current Hormone Treatment No   Past Hormone Therapy No     What other symptoms do you have   Are you Having Heaviness or Tightness No   Are you having Pain No   Are you having pitting edema No   Is it Hard or Difficult finding clothes that fit No   Do you have infections No   Is there Decreased scar mobility No     Lymphedema Assessments   Lymphedema Assessments Upper extremities     Right Upper Extremity Lymphedema    15 cm Proximal to Olecranon Process 34 cm   Olecranon Process 27.5 cm   15 cm Proximal to Ulnar Styloid Process 26.1 cm   Just Proximal to Ulnar Styloid Process 15 cm   Across Hand at Thumb Web Space 18.9 cm   At Base of 2nd Digit 5.8 cm     Left Upper Extremity Lymphedema   15 cm Proximal to Olecranon Process 34.9 cm   Olecranon Process 27 cm   15 cm Proximal to Ulnar Styloid Process 25.5 cm   Just Proximal to Ulnar Styloid Process 15.4 cm     Across Hand at Thumb Web Space 18.9 cm   At Base of 2nd Digit 6 cm           Quick Dash - 11/04/15 0001    Open a tight or new jar Mild difficulty   Do heavy household chores (wash walls, wash floors) Moderate difficulty   Carry a shopping bag or briefcase No difficulty   Wash your back Moderate difficulty   Use a knife to cut food No difficulty   Recreational activities in which you take some force or impact through your arm, shoulder, or hand (golf, hammering, tennis) Moderate difficulty   During the past week, to what extent has your arm, shoulder or hand problem interfered with your normal social activities with family, friends, neighbors, or groups? Slightly   During the past week, to what extent has your arm, shoulder or hand problem limited your work or other regular daily activities Modererately   Arm, shoulder, or hand pain. Mild   Tingling (pins and needles) in your arm, shoulder, or hand None   Difficulty Sleeping No difficulty   DASH Score 25 %             [facility] Adult PT Treatment/Exercise - 11/04/15 0001      Shoulder Exercises: Seated   Other Seated Exercises instructed patient in post op breast cancer exercises and had pt demonstrate 1 rep of each                PT Education - 11/04/15 1203    Education provided Yes   Education Details lymphedema risk reduction practices, post op shoulder exercises   Person(s) Educated Patient   Methods Explanation;Handout   Comprehension Verbalized understanding                 Long Term Clinic Goals - 11/04/15 1207      CC Long Term Goal  #1   Title Patient to independently verbalize lymphedema risk reduction practices   Time 4   Period Weeks   Status New     CC Long Term Goal  #2   Title Patient to be independent in a home exercise program for strengthening and stretching   Time 4   Period Weeks   Status New     CC Long Term Goal  #3   Title Patient to demonstrate 170 degrees of right shoulder flexion to allow her to reach items overhead   Baseline 126   Time 4   Period Weeks   Status New     CC Long Term Goal  #4   Title Patient to demonstrate 165 degrees of right shoulder abduction to allow her to reach items out to sides   Baseline 100   Time 4   Period Weeks   Status New     CC Long Term Goal  #5   Title Patient to demonstrate 65 degrees of right shoulder internal rotation to allow her to return to prior level of function   Baseline 52   Time 4   Period Weeks   Status New            Plan - 11/04/15 1153    Clinical Impression Statement Patient presents to PT with decreased bilateral shoulder ROM following double mastectomy for treatment of right sided breast cancer. She also reports increased swelling in right upper chest. She had a sentinel lymph node biopsy on R with 5 nodes removed. Patient would benefit from skilled PT services to increase bilateral   shoulder ROM and strength, decrease pec tightness and decrease edema in right chest. Patient to begin chemotherapy tomorrow and will complete radiation after she completes chemotherapy.    Rehab Potential Good   Clinical Impairments Affecting Rehab Potential pt to begin chemotherapy   PT Frequency 2x / week   PT Duration 4 weeks   PT Treatment/Interventions Passive range of motion;Taping;ADLs/Self Care Home Management;Therapeutic exercise;Patient/family education;Manual lymph drainage;Manual techniques;Orthotic Fit/Training   PT Next Visit Plan assess for indep  with exercises, AAROM/AROM/PROM   PT Home Exercise Plan breast cancer post op exercises   Consulted and Agree with Plan of Care Patient      Patient will benefit from skilled therapeutic intervention in order to improve the following deficits and impairments:  Increased edema, Decreased knowledge of precautions, Decreased range of motion, Decreased strength, Impaired UE functional use, Pain  Visit Diagnosis: Stiffness of right shoulder, not elsewhere classified  Stiffness of left shoulder, not elsewhere classified  Localized edema     Problem List Patient Active Problem List   Diagnosis Date Noted  . Malignant neoplasm of upper-outer quadrant of right breast in female, estrogen receptor positive (HCC) 10/09/2015  . Breast cancer, female, right 10/01/2015  . Breast cancer, left breast (HCC) 10/01/2015    Melanie Barber 11/04/2015, 12:10 PM   [facility] [address] [place], [place], [address] Phone: [phone]   Fax:  [phone]  Name: Melanie Barber MRN: [id number] Date of Birth: 12/26/1965  Melanie Barber, PT 11/04/15 12:11 PM  

## 2015-11-04 NOTE — Assessment & Plan Note (Signed)
Bilateral mastectomies 10/01/2015 With reconstruction Right mastectomy: IDC grade 2, multifocal largest 2.5 cm, with high-grade DCIS, broadly present at  anterior margin and inferior margin 2/5 LN positive, LVI Present,   Left mastectomy: ALH, ER 95%, PR 5-65%, HER-2 negative, Ki-67 10-20%,  Pathologic stage: T2 N1 a stage IIB Mammaprint: High risk CT-CAP and bone scan: No evidence of metastatic disease ----------------------------------------------------------------------------------------------------------------------------------------------------------------- Treatment plan: 1. Because of lymph node involvement I discussed the risks and benefits of doing axillary lymph node dissection. With the advent of chemotherapy and adjuvant radiation, axillary lymph node dissection has not shown to have substantial benefit. 2. Adjuvant chemotherapy with dose dense Adriamycin and Cytoxan 4 followed by Abraxane weekly 12 ( patient is intolerant to steroids and hence we cannot use paclitaxel) 3. Followed by adjuvant radiation therapy 4. Followed by adjuvant antiestrogen therapy with tamoxifen 10 years  PREVENT trial: [id number]  ------------------------------------------------------------------------------------------------------------------------------- Current Treatment: Cycle 1 Day 1 dose dense Adriamycin and Cytoxan Current treatment: Cycle 1 day 1 dose dense Adriamycin Cytoxan Antiemetics were reviewed Chemotherapy consent obtained Chemotherapy education completed Echocardiogram 10/17/2015: EF 55-60% Closely monitoring for chemotherapy toxicities. Return to clinic in one week for toxicity check

## 2015-11-05 ENCOUNTER — Ambulatory Visit (HOSPITAL_BASED_OUTPATIENT_CLINIC_OR_DEPARTMENT_OTHER): Payer: 59

## 2015-11-05 ENCOUNTER — Other Ambulatory Visit (HOSPITAL_BASED_OUTPATIENT_CLINIC_OR_DEPARTMENT_OTHER): Payer: 59

## 2015-11-05 ENCOUNTER — Encounter: Payer: Self-pay | Admitting: Hematology and Oncology

## 2015-11-05 ENCOUNTER — Encounter: Payer: Self-pay | Admitting: *Deleted

## 2015-11-05 ENCOUNTER — Ambulatory Visit (HOSPITAL_BASED_OUTPATIENT_CLINIC_OR_DEPARTMENT_OTHER): Payer: 59 | Admitting: Hematology and Oncology

## 2015-11-05 VITALS — BP 118/75 | HR 79 | Temp 98.2°F | Resp 19 | Wt 190.2 lb

## 2015-11-05 DIAGNOSIS — Z170 Estrogen receptor positive status [ER+]: Secondary | ICD-10-CM | POA: Diagnosis not present

## 2015-11-05 DIAGNOSIS — C50411 Malignant neoplasm of upper-outer quadrant of right female breast: Secondary | ICD-10-CM

## 2015-11-05 DIAGNOSIS — Z006 Encounter for examination for normal comparison and control in clinical research program: Secondary | ICD-10-CM | POA: Diagnosis not present

## 2015-11-05 DIAGNOSIS — Z5111 Encounter for antineoplastic chemotherapy: Secondary | ICD-10-CM | POA: Diagnosis not present

## 2015-11-05 LAB — CBC WITH DIFFERENTIAL/PLATELET
BASO%: 1.1 % (ref 0.0–2.0)
Basophils Absolute: 0.1 10*3/uL (ref 0.0–0.1)
EOS ABS: 0.3 10*3/uL (ref 0.0–0.5)
EOS%: 6.7 % (ref 0.0–7.0)
HCT: 39.3 % (ref 34.8–46.6)
HEMOGLOBIN: 13 g/dL (ref 11.6–15.9)
LYMPH%: 27.6 % (ref 14.0–49.7)
MCH: 30.2 pg (ref 25.1–34.0)
MCHC: 33.1 g/dL (ref 31.5–36.0)
MCV: 91.5 fL (ref 79.5–101.0)
MONO#: 0.3 10*3/uL (ref 0.1–0.9)
MONO%: 6.3 % (ref 0.0–14.0)
NEUT%: 58.3 % (ref 38.4–76.8)
NEUTROS ABS: 2.6 10*3/uL (ref 1.5–6.5)
Platelets: 207 10*3/uL (ref 145–400)
RBC: 4.3 10*6/uL (ref 3.70–5.45)
RDW: 12.9 % (ref 11.2–14.5)
WBC: 4.5 10*3/uL (ref 3.9–10.3)
lymph#: 1.3 10*3/uL (ref 0.9–3.3)

## 2015-11-05 LAB — COMPREHENSIVE METABOLIC PANEL
ALBUMIN: 3.8 g/dL (ref 3.5–5.0)
ALK PHOS: 82 U/L (ref 40–150)
ALT: 13 U/L (ref 0–55)
AST: 18 U/L (ref 5–34)
Anion Gap: 8 mEq/L (ref 3–11)
BILIRUBIN TOTAL: 0.64 mg/dL (ref 0.20–1.20)
BUN: 14.7 mg/dL (ref 7.0–26.0)
CO2: 26 meq/L (ref 22–29)
CREATININE: 0.7 mg/dL (ref 0.6–1.1)
Calcium: 9 mg/dL (ref 8.4–10.4)
Chloride: 106 mEq/L (ref 98–109)
GLUCOSE: 83 mg/dL (ref 70–140)
Potassium: 4.2 mEq/L (ref 3.5–5.1)
SODIUM: 140 meq/L (ref 136–145)
TOTAL PROTEIN: 7.2 g/dL (ref 6.4–8.3)

## 2015-11-05 LAB — RESEARCH LABS

## 2015-11-05 MED ORDER — HEPARIN SOD (PORK) LOCK FLUSH 100 UNIT/ML IV SOLN
500.0000 [IU] | Freq: Once | INTRAVENOUS | Status: AC | PRN
  Administered 2015-11-05: 500 [IU]
  Filled 2015-11-05: qty 5

## 2015-11-05 MED ORDER — SODIUM CHLORIDE 0.9 % IV SOLN
Freq: Once | INTRAVENOUS | Status: AC
  Administered 2015-11-05: 14:00:00 via INTRAVENOUS

## 2015-11-05 MED ORDER — SODIUM CHLORIDE 0.9 % IV SOLN
Freq: Once | INTRAVENOUS | Status: AC
  Administered 2015-11-05: 14:00:00 via INTRAVENOUS
  Filled 2015-11-05: qty 5

## 2015-11-05 MED ORDER — PALONOSETRON HCL INJECTION 0.25 MG/5ML
0.2500 mg | Freq: Once | INTRAVENOUS | Status: AC
  Administered 2015-11-05: 0.25 mg via INTRAVENOUS

## 2015-11-05 MED ORDER — PEGFILGRASTIM 6 MG/0.6ML ~~LOC~~ PSKT
6.0000 mg | PREFILLED_SYRINGE | Freq: Once | SUBCUTANEOUS | Status: AC
  Administered 2015-11-05: 6 mg via SUBCUTANEOUS
  Filled 2015-11-05: qty 0.6

## 2015-11-05 MED ORDER — DOXORUBICIN HCL CHEMO IV INJECTION 2 MG/ML
60.0000 mg/m2 | Freq: Once | INTRAVENOUS | Status: AC
  Administered 2015-11-05: 120 mg via INTRAVENOUS
  Filled 2015-11-05: qty 60

## 2015-11-05 MED ORDER — CYCLOPHOSPHAMIDE CHEMO INJECTION 1 GM
600.0000 mg/m2 | Freq: Once | INTRAMUSCULAR | Status: AC
  Administered 2015-11-05: 1200 mg via INTRAVENOUS
  Filled 2015-11-05: qty 60

## 2015-11-05 MED ORDER — PALONOSETRON HCL INJECTION 0.25 MG/5ML
INTRAVENOUS | Status: AC
  Filled 2015-11-05: qty 5

## 2015-11-05 MED ORDER — SODIUM CHLORIDE 0.9% FLUSH
10.0000 mL | INTRAVENOUS | Status: DC | PRN
  Administered 2015-11-05: 10 mL
  Filled 2015-11-05: qty 10

## 2015-11-05 NOTE — Progress Notes (Signed)
Patient Care Team: No Pcp Per Patient as PCP - General (General Practice)  DIAGNOSIS:  Encounter Diagnosis  Name Primary?  . Malignant neoplasm of upper-outer quadrant of right breast in female, estrogen receptor positive ([place]) Yes    SUMMARY OF ONCOLOGIC HISTORY:   Breast cancer of upper-outer quadrant of right female breast ([name])   09/02/2015 Mammogram    Right breast UOQ: 2 masses 3 cm apart, U/S they measured 3.4 cm, larger mass at 1.6 cm, in addition, 1 cm mass at 9:00 and 0.9 cm mass at 8:30( could be intramammary LN); right axillary LN 11 mm; microcalcs 10 cm span       09/09/2015 Initial Diagnosis    Right breast biopsy OUQ: DCIS with calcs and necrosis ER 95%, PR 80%; 9:30 position 5cmfn: IDC with DCIS ER 95%, PR 65%, Ki-67 20%,; right biopsy 8:30 position 3cmfn ER 95%, PR 5%, Ki-67 10%: intramammary LN with IDC      09/12/2015 Breast MRI    Multicentric right breast cancer cluster of enhancing masses 4.1 x 2.2 x 2.6 cm, LOQ: 1.1 cm enhancing mass previously biopsied, subcutaneous low right breast 2 cm mass not biopsied, multiple other small enhancing masses, 1 cm right axillary lymph node      10/01/2015 Surgery    Right mastectomy: IDC grade 2, multifocal largest 2.5 cm, with high-grade DCIS, broadly present at  anterior margin and inferior margin 2/5 LN positive, LVI Present,  Left mastectomy: ALH, ER 95%, PR 5-65%, HER-2 negative, Ki-67 10-20%, T2 N1 a stage IIB      11/05/2015 -  Chemotherapy    Adjuvant chemotherapy with dose dense Adriamycin and Cytoxan 4 followed by Abraxane weekly 12       CHIEF COMPLIANT: Cycle 1 day 1 dose dense Adriamycin and Cytoxan  INTERVAL HISTORY: Melanie Barber is a 50 year old with above-mentioned history of multicentric right breast cancer who underwent mastectomy and is here to receive her first cycle of adjuvant chemotherapy with dose dense Adriamycin and Cytoxan. She has healed very well from the prior mastectomy surgeries. She had  met with Dr. Westley Foots at [facility] who agreed with the treatment plan.  REVIEW OF SYSTEMS:   Constitutional: Denies fevers, chills or abnormal weight loss Eyes: Denies blurriness of vision Ears, nose, mouth, throat, and face: Denies mucositis or sore throat Respiratory: Denies cough, dyspnea or wheezes Cardiovascular: Denies palpitation, chest discomfort Gastrointestinal:  Denies nausea, heartburn or change in bowel habits Skin: Denies abnormal skin rashes Lymphatics: Denies new lymphadenopathy or easy bruising Neurological:Denies numbness, tingling or new weaknesses Behavioral/Psych: Mood is stable, no new changes  Extremities: No lower extremity edema All other systems were reviewed with the patient and are negative.  I have reviewed the past medical history, past surgical history, social history and family history with the patient and they are unchanged from previous note.  ALLERGIES:  is allergic to keflex [cephalexin].  MEDICATIONS:  Current Outpatient Prescriptions  Medication Sig Dispense Refill  . Atorvastatin Calcium (INVESTIGATIONAL ATORVASTATIN/PLACEBO) 40 MG tablet [facility] [address] Take 1 tablet by mouth daily. Take 2 doses (these doses must be 12 hours apart) prior to first chemotherapy treatment. Then take 1 tablet daily by mouth with or without food. 180 tablet 0  . ibuprofen (ADVIL,MOTRIN) 200 MG tablet Take 400 mg by mouth every 6 (six) hours as needed for mild pain.    Marland Kitchen levonorgestrel (MIRENA) 20 MCG/24HR IUD 1 each by Intrauterine route once.    . lidocaine-prilocaine (  EMLA) cream Apply to affected area once 30 g 3  . LORazepam (ATIVAN) 0.5 MG tablet Take 1 tablet (0.5 mg total) by mouth every 6 (six) hours as needed (Nausea or vomiting). (Patient not taking: Reported on 11/04/2015) 30 tablet 0  . Multiple Vitamin (MULTIVITAMIN WITH MINERALS) TABS tablet Take 1 tablet by mouth daily.    . ondansetron (ZOFRAN) 8 MG tablet Take 1 tablet (8 mg total) by  mouth 2 (two) times daily as needed. Start on the third day after chemotherapy. (Patient not taking: Reported on 11/04/2015) 30 tablet 1  . prochlorperazine (COMPAZINE) 10 MG tablet Take 1 tablet (10 mg total) by mouth every 6 (six) hours as needed (Nausea or vomiting). (Patient not taking: Reported on 11/04/2015) 30 tablet 1   No current facility-administered medications for this visit.    Facility-Administered Medications Ordered in Other Visits  Medication Dose Route Frequency Provider Last Rate Last Dose  . cyclophosphamide (CYTOXAN) 1,200 mg in sodium chloride 0.9 % 250 mL chemo infusion  600 mg/m2 (Treatment Plan Recorded) Intravenous Once Nicholas Lose, MD      . DOXOrubicin (ADRIAMYCIN) chemo injection 120 mg  60 mg/m2 (Treatment Plan Recorded) Intravenous Once Nicholas Lose, MD      . fosaprepitant (EMEND) 150 mg in sodium chloride 0.9 % 145 mL IVPB   Intravenous Once Nicholas Lose, MD 450 mL/hr at 11/05/15 1416    . pegfilgrastim (NEULASTA ONPRO KIT) injection 6 mg  6 mg Subcutaneous Once Nicholas Lose, MD        PHYSICAL EXAMINATION: ECOG PERFORMANCE STATUS: 0 - Asymptomatic  Vitals:   11/05/15 1145  BP: 118/75  Pulse: 79  Resp: 19  Temp: 98.2 F (36.8 C)   Filed Weights   11/05/15 1145  Weight: 190 lb 3.2 oz (86.3 kg)    GENERAL:alert, no distress and comfortable SKIN: skin color, texture, turgor are normal, no rashes or significant lesions EYES: normal, Conjunctiva are pink and non-injected, sclera clear OROPHARYNX:no exudate, no erythema and lips, buccal mucosa, and tongue normal  NECK: supple, thyroid normal size, non-tender, without nodularity LYMPH:  no palpable lymphadenopathy in the cervical, axillary or inguinal LUNGS: clear to auscultation and percussion with normal breathing effort HEART: regular rate & rhythm and no murmurs and no lower extremity edema ABDOMEN:abdomen soft, non-tender and normal bowel sounds MUSCULOSKELETAL:no cyanosis of digits and no  clubbing  NEURO: alert & oriented x 3 with fluent speech, no focal motor/sensory deficits EXTREMITIES: No lower extremity edema   LABORATORY DATA:  I have reviewed the data as listed   Chemistry      Component Value Date/Time   NA 140 11/05/2015 1131   K 4.2 11/05/2015 1131   CL 105 09/26/2015 1330   CO2 26 11/05/2015 1131   BUN 14.7 11/05/2015 1131   CREATININE 0.7 11/05/2015 1131      Component Value Date/Time   CALCIUM 9.0 11/05/2015 1131   ALKPHOS 82 11/05/2015 1131   AST 18 11/05/2015 1131   ALT 13 11/05/2015 1131   BILITOT 0.64 11/05/2015 1131      Lab Results  Component Value Date   WBC 4.5 11/05/2015   HGB 13.0 11/05/2015   HCT 39.3 11/05/2015   MCV 91.5 11/05/2015   PLT 207 11/05/2015   NEUTROABS 2.6 11/05/2015   ASSESSMENT & PLAN:  Breast cancer of upper-outer quadrant of right female breast ([place]) Bilateral mastectomies 10/01/2015 With reconstruction Right mastectomy: IDC grade 2, multifocal largest 2.5 cm, with high-grade DCIS, broadly present at  anterior margin and inferior margin 2/5 LN positive, LVI Present,   Left mastectomy: ALH, ER 95%, PR 5-65%, HER-2 negative, Ki-67 10-20%,  Pathologic stage: T2 N1 a stage IIB Mammaprint: High risk CT-CAP and bone scan: No evidence of metastatic disease ----------------------------------------------------------------------------------------------------------------------------------------------------------------- Treatment plan: 1. Because of lymph node involvement I discussed the risks and benefits of doing axillary lymph node dissection. With the advent of chemotherapy and adjuvant radiation, axillary lymph node dissection has not shown to have substantial benefit. 2. Adjuvant chemotherapy with dose dense Adriamycin and Cytoxan 4 followed by Abraxane weekly 12 ( patient is intolerant to steroids and hence we cannot use paclitaxel) 3. Followed by adjuvant radiation therapy 4. Followed by adjuvant antiestrogen  therapy with tamoxifen 10 years  PREVENT trial: [id number]  ------------------------------------------------------------------------------------------------------------------------------- Current treatment: Cycle 1 day 1 dose dense Adriamycin Cytoxan Antiemetics were reviewed Chemotherapy consent obtained Chemotherapy education completed Echocardiogram 10/17/2015: EF 55-60% Closely monitoring for chemotherapy toxicities. Return to clinic in one week for Blood count check and in 2 weeks for cycle 2 chemotherapy. She will see Dr. Jana Hakim as I'm out of town that day.  No orders of the defined types were placed in this encounter.  The patient has a good understanding of the overall plan. she agrees with it. she will call with any problems that may develop before the next visit here.   Rulon Eisenmenger, MD 11/05/15

## 2015-11-06 ENCOUNTER — Encounter: Payer: Self-pay | Admitting: *Deleted

## 2015-11-06 ENCOUNTER — Ambulatory Visit: Payer: 59 | Admitting: Physical Therapy

## 2015-11-10 ENCOUNTER — Telehealth: Payer: Self-pay

## 2015-11-10 ENCOUNTER — Encounter: Payer: Self-pay | Admitting: *Deleted

## 2015-11-10 NOTE — Telephone Encounter (Signed)
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 She tolerated treatment well.  Also had Onpro

## 2015-11-10 NOTE — Telephone Encounter (Signed)
lvm f/u chemo on 1st.

## 2015-11-11 ENCOUNTER — Ambulatory Visit: Payer: 59 | Admitting: Physical Therapy

## 2015-11-12 ENCOUNTER — Ambulatory Visit: Payer: 59 | Attending: Plastic Surgery | Admitting: Physical Therapy

## 2015-11-12 ENCOUNTER — Other Ambulatory Visit (HOSPITAL_BASED_OUTPATIENT_CLINIC_OR_DEPARTMENT_OTHER): Payer: 59

## 2015-11-12 DIAGNOSIS — C50411 Malignant neoplasm of upper-outer quadrant of right female breast: Secondary | ICD-10-CM

## 2015-11-12 DIAGNOSIS — M25611 Stiffness of right shoulder, not elsewhere classified: Secondary | ICD-10-CM | POA: Insufficient documentation

## 2015-11-12 DIAGNOSIS — M25612 Stiffness of left shoulder, not elsewhere classified: Secondary | ICD-10-CM | POA: Diagnosis not present

## 2015-11-12 DIAGNOSIS — R600 Localized edema: Secondary | ICD-10-CM | POA: Insufficient documentation

## 2015-11-12 DIAGNOSIS — Z170 Estrogen receptor positive status [ER+]: Principal | ICD-10-CM

## 2015-11-12 LAB — CBC WITH DIFFERENTIAL/PLATELET
BASO%: 2.2 % — ABNORMAL HIGH (ref 0.0–2.0)
Basophils Absolute: 0 10*3/uL (ref 0.0–0.1)
EOS%: 10.1 % — ABNORMAL HIGH (ref 0.0–7.0)
Eosinophils Absolute: 0.1 10*3/uL (ref 0.0–0.5)
HCT: 33.6 % — ABNORMAL LOW (ref 34.8–46.6)
HEMOGLOBIN: 11.7 g/dL (ref 11.6–15.9)
LYMPH%: 47.8 % (ref 14.0–49.7)
MCH: 31.2 pg (ref 25.1–34.0)
MCHC: 34.8 g/dL (ref 31.5–36.0)
MCV: 89.6 fL (ref 79.5–101.0)
MONO#: 0.1 10*3/uL (ref 0.1–0.9)
MONO%: 7.2 % (ref 0.0–14.0)
NEUT%: 32.7 % — ABNORMAL LOW (ref 38.4–76.8)
NEUTROS ABS: 0.5 10*3/uL — AB (ref 1.5–6.5)
Platelets: 150 10*3/uL (ref 145–400)
RBC: 3.75 10*6/uL (ref 3.70–5.45)
RDW: 11.9 % (ref 11.2–14.5)
WBC: 1.4 10*3/uL — AB (ref 3.9–10.3)
lymph#: 0.7 10*3/uL — ABNORMAL LOW (ref 0.9–3.3)

## 2015-11-12 LAB — COMPREHENSIVE METABOLIC PANEL
ALBUMIN: 3.7 g/dL (ref 3.5–5.0)
ALT: 11 U/L (ref 0–55)
AST: 13 U/L (ref 5–34)
Alkaline Phosphatase: 87 U/L (ref 40–150)
Anion Gap: 9 mEq/L (ref 3–11)
BILIRUBIN TOTAL: 0.59 mg/dL (ref 0.20–1.20)
BUN: 11.3 mg/dL (ref 7.0–26.0)
CO2: 24 meq/L (ref 22–29)
CREATININE: 0.7 mg/dL (ref 0.6–1.1)
Calcium: 9.2 mg/dL (ref 8.4–10.4)
Chloride: 107 mEq/L (ref 98–109)
EGFR: >90 mL/min/{1.73_m2} (ref >90)
GLUCOSE: 122 mg/dL (ref 70–140)
Potassium: 3.6 mEq/L (ref 3.5–5.1)
SODIUM: 139 meq/L (ref 136–145)
TOTAL PROTEIN: 6.8 g/dL (ref 6.4–8.3)

## 2015-11-12 NOTE — Progress Notes (Signed)
Received critical lab of ANC 0.5. Pt coming in for nadir check today.

## 2015-11-12 NOTE — Therapy (Signed)
[place], [place], [address] Phone: [phone]   Fax:  [phone]  Physical Therapy Treatment  Patient Details  Name: Melanie Barber MRN: [id number] Date of Birth: Jun 26, 1965 Referring Provider: Thimmappa  Encounter Date: 11/12/2015      PT End of Session - 11/12/15 2025    Visit Number 2   Number of Visits 9   Date for PT Re-Evaluation 12/02/15   PT Start Time 1346   PT Stop Time 1432   PT Time Calculation (min) 46 min   Activity Tolerance Patient tolerated treatment well   Behavior During Therapy [facility] for tasks assessed/performed      Past Medical History:  Diagnosis Date  . Arthritis    right knee (10/01/2015)  . Cancer of right breast ([place])   . Migraine    "once when I was going thru a divorce; called them cluster" (10/01/2015)  . PONV (postoperative nausea and vomiting)   . Sciatic leg pain    left leg    Past Surgical History:  Procedure Laterality Date  . BREAST BIOPSY Right   . BREAST CYST ASPIRATION Right   . BREAST RECONSTRUCTION WITH PLACEMENT OF TISSUE EXPANDER AND FLEX HD (ACELLULAR HYDRATED DERMIS) Bilateral 10/01/2015  . BREAST RECONSTRUCTION WITH PLACEMENT OF TISSUE EXPANDER AND FLEX HD (ACELLULAR HYDRATED DERMIS) Bilateral 10/01/2015   Procedure: BREAST RECONSTRUCTION WITH PLACEMENT OF TISSUE EXPANDER AND CORTIVA;  Surgeon: Irene Limbo, MD;  Location: [place];  Service: Plastics;  Laterality: Bilateral;  . [place]   growth removed  . MASTECTOMY Left 10/01/2015   NIPPLE SPARING  . MASTECTOMY COMPLETE / SIMPLE W/ SENTINEL NODE BIOPSY Right 10/01/2015   NIPPLE SPARING  . NASAL/SINUS ENDOSCOPY Bilateral 2005  . NIPPLE SPARING MASTECTOMY/SENTINAL LYMPH NODE BIOPSY/RECONSTRUCTION/PLACEMENT OF TISSUE EXPANDER Bilateral 10/01/2015   Procedure: BILATERAL NIPPLE SPARING MASTECTOMY WITH RIGHT SENTINAL LYMPH NODE BIOPSY;  Surgeon: Rolm Bookbinder, MD;  Location:  [place];  Service: General;  Laterality: Bilateral;  . PORTACATH PLACEMENT Right 10/01/2015  . PORTACATH PLACEMENT Right 10/01/2015   Procedure: INSERTION PORT-A-CATH;  Surgeon: Rolm Bookbinder, MD;  Location: [place];  Service: General;  Laterality: Right;  . TOE SURGERY Left 1990   "bone spur on great toe"  . TONSILLECTOMY  1977  . ULTRASOUND GUIDANCE FOR VASCULAR ACCESS Right 10/01/2015   Procedure: ULTRASOUND GUIDANCE;  Surgeon: Rolm Bookbinder, MD;  Location: [place];  Service: General;  Laterality: Right;  . WISDOM TOOTH EXTRACTION      There were no vitals filed for this visit.      Subjective Assessment - 11/12/15 1350    Subjective Had a little of nausea; had my chemo on the first.  Having to stay on the anti-nausea meds 24 hours.  Has been doing the exercises.  Patient feels she has  made good gains in ROM.   Currently in Pain? No/denies            [facility] PT Assessment - 11/12/15 0001      Precautions   Precaution Comments says she is off all restrictions from plastic surgeon other than to increase weights only gradually                     [facility] Adult PT Treatment/Exercise - 11/12/15 0001      Self-Care   Self-Care Other Self-Care Comments   Other Self-Care Comments  discussed obtaining a prophylactic compression sleeve and when to wear it; also discussed  doing some strengthening before returning to work     Shoulder Exercises: Seated   Other Seated Exercises reviewed [place] post-op exercises with patient performing each 5-10 times     Shoulder Exercises: Pulleys   Flexion 2 minutes   ABduction 2 minutes     Shoulder Exercises: Therapy Ball   Flexion Other (comment)  limited benefit because pt. gets ball to ceiling   ABduction 10 reps   Right/Left 10 reps  on right   Right/Left Limitations no benefit on left     Manual Therapy   Manual Therapy Passive ROM   Passive ROM to right and left shoulder for abduction and flexion (er not really needed)                         [facility] Goals - 11/04/15 1207      CC Long Term Goal  #1   Title Patient to independently verbalize lymphedema risk reduction practices   Time 4   Period Weeks   Status New     CC Long Term Goal  #2   Title Patient to be independent in a home exercise program for strengthening and stretching   Time 4   Period Weeks   Status New     CC Long Term Goal  #3   Title Patient to demonstrate 170 degrees of right shoulder flexion to allow her to reach items overhead   Baseline 126   Time 4   Period Weeks   Status New     CC Long Term Goal  #4   Title Patient to demonstrate 165 degrees of right shoulder abduction to allow her to reach items out to sides   Baseline 100   Time 4   Period Weeks   Status New     CC Long Term Goal  #5   Title Patient to demonstrate 65 degrees of right shoulder internal rotation to allow her to return to prior level of function   Baseline 52   Time 4   Period Weeks   Status New            Plan - 11/12/15 2025    Clinical Impression Statement Patient did well today and reports she feels somewhat looser already.  Some exercises were not beneficial to her (some stretches), but others were, and P/ROM definitely indicated a need for further stretching.   Rehab Potential Good   Clinical Impairments Affecting Rehab Potential in chemo   PT Frequency 2x / week   PT Duration 4 weeks   PT Treatment/Interventions Passive range of motion;Taping;ADLs/Self Care Home Management;Therapeutic exercise;Patient/family education;Manual lymph drainage;Manual techniques;Orthotic Fit/Training   PT Next Visit Plan Continue manual work for P and AA/ROM.  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   PT Home Exercise Plan breast cancer post op exercises   Consulted and Agree with Plan of Care Patient      Patient will benefit from  skilled therapeutic intervention in order to improve the following deficits and impairments:  Increased edema, Decreased knowledge of precautions, Decreased range of motion, Decreased strength, Impaired UE functional use, Pain  Visit Diagnosis: Stiffness of right shoulder, not elsewhere classified  Stiffness of left shoulder, not elsewhere classified     Problem List Patient Active Problem List   Diagnosis Date Noted  . Malignant neoplasm of upper-outer quadrant of right breast in  female, estrogen receptor positive ([place]) 10/09/2015  . Breast cancer of upper-outer quadrant of right female breast ([place]) 09/12/2015    Barber,Melanie 11/12/2015, 8:29 PM  [place], [place], [address] Phone: [phone]   Fax:  [phone]  Name: Melanie Barber MRN: [id number] Date of Birth: 1965/12/19   Serafina Royals, PT 11/12/15 8:29 PM

## 2015-11-12 NOTE — Progress Notes (Signed)
Received critical ANC of 0.5 from lab as pt in for NADIR check.  Reviewed neutropenic precautions and gave pt masks during this critical period.  Discussed with pt to inform us if she has any signs of infection, which she denies at this time.  Pt verbalized understanding of all information discussed and to contact us if she has any further questions or concerns.

## 2015-11-13 ENCOUNTER — Encounter: Payer: Self-pay | Admitting: *Deleted

## 2015-11-17 ENCOUNTER — Encounter: Payer: Self-pay | Admitting: Radiation Oncology

## 2015-11-17 DIAGNOSIS — Z1379 Encounter for other screening for genetic and chromosomal anomalies: Secondary | ICD-10-CM | POA: Insufficient documentation

## 2015-11-17 NOTE — Progress Notes (Signed)
Paperwork [place]) received 11/8, given to nurse 11/13

## 2015-11-17 NOTE — Progress Notes (Signed)
GENETIC TEST RESULT  HPI: Ms. Fennelly was previously seen in the [place] clinic due to a personal and family history of breast cancer and concerns regarding a hereditary predisposition to cancer. Please refer to our prior cancer genetics clinic note from October 09, 2015 for more information regarding Ms. Eisenhart's medical, social and family histories, and our assessment and recommendations, at the time. Ms. Schaffer recent genetic test results were disclosed to her, as were recommendations warranted by these results. These results and recommendations are discussed in more detail below.  GENETIC TEST RESULTS: At the time of Ms. Polakowski's visit on 10/09/15, we recommended she pursue genetic testing of the 42-gene [employer] Common Hereditary Cancers Panel (Breast, Gyn, GI) through [facility]. The 42-gene [employer] Common Hereditary Cancers Panel (Breast, Gyn, GI) performed by [facility] [place]) includes sequencing and/or deletion/duplication analysis for the following genes: APC, ATM, AXIN2, BARD1, BMPR1A, BRCA1, BRCA2, BRIP1, CDH1, CDKN2A, CHEK2, DICER1, EPCAM, GREM1, KIT, MEN1, MLH1, MSH2, MSH6, MUTYH, NBN, NF1, PALB2, PDGFRA, PMS2, POLD1, POLE, PTEN, RAD50, RAD51C, RAD51D, SDHA, SDHB, SDHC, SDHD, SMAD4, SMARCA4, STK11, TP53, TSC1, TSC2, and VHL.  Those results are now back, the report date for which is October 24, 2015.  Genetic testing was normal, and did not reveal a deleterious mutation in these genes. One variant of uncertain significance (VUS) was found in the RAD50 gene. The test report will be scanned into EPIC and will be located under the Results Review tab in the Pathology>Molecular Pathology section.   Genetic testing did identify a variant of uncertain significance (VUS) called "c.1456C>T (p.Arg486Cys)" in one copy of the RAD50 gene. At this time, it is unknown if this VUS is associated with an increased risk for cancer or if this is a normal  finding. Since this VUS result is uncertain, it cannot help guide screening recommendations, and family members should not be tested for this VUS to help define their own cancer risks.  Also, we all have variants within our genes that make [place] unique individuals--most of these variants are benign.  Thus, we treat this VUS as a negative result, until it gets updated by the lab.   With time, we suspect the lab will reclassify this variant and when they do, we will try to re-contact Ms. Burkes to discuss the reclassification further.  We also encouraged Ms. Saddler to contact us in a year or two to obtain an update on the status of this VUS.  We discussed with Ms. Nee that since the current genetic testing is not perfect, it is possible there may be a gene mutation in one of these genes that current testing cannot detect, but that chance is small. We also discussed, that it is possible that another gene that has not yet been discovered, or that we have not yet tested, is responsible for the cancer diagnoses in the family, and it is, therefore, important to remain in touch with cancer genetics in the future so that we can continue to offer Ms. Doxtater the most up-to-date genetic testing.   CANCER SCREENING RECOMMENDATIONS: While we still do not have an explanation for the personal and family history of cancer, this result may be reassuring and indicate that Ms. Martone likely does not have an increased risk for a future cancer due to a mutation in one of these genes. This normal test also suggests that Ms. Rufus's cancer was most likely not due to an inherited predisposition associated with one of these genes.  Most cancers happen by chance and this negative test suggests that her cancer falls into this category.  We, therefore, recommended she continue to follow the cancer management and screening guidelines provided by her oncology and primary healthcare providers.   RECOMMENDATIONS FOR FAMILY MEMBERS:  Women in this family might be at some increased risk of developing cancer, over the general population risk, simply due to the family history of cancer. We recommended women in this family have a yearly mammogram beginning at age 69, or 14 years younger than the earliest onset of cancer, an annual clinical breast exam, and perform monthly breast self-exams. Women in this family should also have a gynecological exam as recommended by their primary provider. All family members should have a colonoscopy by age 42.  FOLLOW-UP: Lastly, we discussed with Ms. Hugill that cancer genetics is a rapidly advancing field and it is possible that new genetic tests will be appropriate for her and/or her family members in the future. We encouraged her to remain in contact with cancer genetics on an annual basis so we can update her personal and family histories and let her know of advances in cancer genetics that may benefit this family.   Our contact number was provided. Ms. Gladwin questions were answered to her satisfaction, and she knows she is welcome to call us at anytime with additional questions or concerns.   Jeanine Luz, MS, [facility] Certified Genetic Counselor [email or web address] Phone: [phone]

## 2015-11-18 ENCOUNTER — Encounter: Payer: Self-pay | Admitting: Radiation Oncology

## 2015-11-18 ENCOUNTER — Ambulatory Visit: Payer: 59 | Admitting: Physical Therapy

## 2015-11-18 DIAGNOSIS — R600 Localized edema: Secondary | ICD-10-CM

## 2015-11-18 DIAGNOSIS — M25611 Stiffness of right shoulder, not elsewhere classified: Secondary | ICD-10-CM

## 2015-11-18 DIAGNOSIS — M25612 Stiffness of left shoulder, not elsewhere classified: Secondary | ICD-10-CM

## 2015-11-18 NOTE — Therapy (Signed)
[place], [place], [address] Phone: [phone]   Fax:  [phone]  Physical Therapy Treatment  Patient Details  Name: Melanie Barber MRN: [id number] Date of Birth: 07-15-1965 Referring Provider: Thimmappa  Encounter Date: 11/18/2015      PT End of Session - 11/18/15 1805    Visit Number 3   Number of Visits 9   Date for PT Re-Evaluation 12/02/15   PT Start Time 9480   PT Stop Time 1430   PT Time Calculation (min) 45 min   Activity Tolerance Patient tolerated treatment well   Behavior During Therapy [facility] for tasks assessed/performed      Past Medical History:  Diagnosis Date  . Arthritis    right knee (10/01/2015)  . Cancer of right breast ([place])   . Migraine    "once when I was going thru a divorce; called them cluster" (10/01/2015)  . PONV (postoperative nausea and vomiting)   . Sciatic leg pain    left leg    Past Surgical History:  Procedure Laterality Date  . BREAST BIOPSY Right   . BREAST CYST ASPIRATION Right   . BREAST RECONSTRUCTION WITH PLACEMENT OF TISSUE EXPANDER AND FLEX HD (ACELLULAR HYDRATED DERMIS) Bilateral 10/01/2015  . BREAST RECONSTRUCTION WITH PLACEMENT OF TISSUE EXPANDER AND FLEX HD (ACELLULAR HYDRATED DERMIS) Bilateral 10/01/2015   Procedure: BREAST RECONSTRUCTION WITH PLACEMENT OF TISSUE EXPANDER AND CORTIVA;  Surgeon: Irene Limbo, MD;  Location: [place];  Service: Plastics;  Laterality: Bilateral;  . [place]   growth removed  . MASTECTOMY Left 10/01/2015   NIPPLE SPARING  . MASTECTOMY COMPLETE / SIMPLE W/ SENTINEL NODE BIOPSY Right 10/01/2015   NIPPLE SPARING  . NASAL/SINUS ENDOSCOPY Bilateral 2005  . NIPPLE SPARING MASTECTOMY/SENTINAL LYMPH NODE BIOPSY/RECONSTRUCTION/PLACEMENT OF TISSUE EXPANDER Bilateral 10/01/2015   Procedure: BILATERAL NIPPLE SPARING MASTECTOMY WITH RIGHT SENTINAL LYMPH NODE BIOPSY;  Surgeon: Rolm Bookbinder, MD;  Location:  [place];  Service: General;  Laterality: Bilateral;  . PORTACATH PLACEMENT Right 10/01/2015  . PORTACATH PLACEMENT Right 10/01/2015   Procedure: INSERTION PORT-A-CATH;  Surgeon: Rolm Bookbinder, MD;  Location: [place];  Service: General;  Laterality: Right;  . TOE SURGERY Left 1990   "bone spur on great toe"  . TONSILLECTOMY  1977  . ULTRASOUND GUIDANCE FOR VASCULAR ACCESS Right 10/01/2015   Procedure: ULTRASOUND GUIDANCE;  Surgeon: Rolm Bookbinder, MD;  Location: [place];  Service: General;  Laterality: Right;  . WISDOM TOOTH EXTRACTION      There were no vitals filed for this visit.      Subjective Assessment - 11/18/15 1351    Subjective "I think good '  will have another chemo treatment tomorrow    Pertinent History Right mastectomy: IDC grade 2, multifocal largest 2.5 cm, with high-grade DCIS, broadly present at  anterior margin and inferior margin 2/5 LN positive, LVI Present,  Left mastectomy: ALH, ER 95%, PR 5-65%, HER-2 negative, Ki-67 10-20%, T2 N1 a stage IIB, patient to begin chemotherapy 11/05/15 then radiation once chemo is completed in approx 5 months   Patient Stated Goals improve ROM   Currently in Pain? No/denies            [facility] PT Assessment - 11/18/15 0001      AROM   Right Shoulder Flexion 160 Degrees   Right Shoulder ABduction 163 Degrees   Left Shoulder Flexion 165 Degrees   Left Shoulder ABduction 161 Degrees  [place] Adult PT Treatment/Exercise - 11/18/15 0001      Self-Care   Self-Care Other Self-Care Comments   Other Self-Care Comments  reviewed lymphedema risk reduction and pt verbally acknowledged understanding      Lumbar Exercises: Supine   Other Supine Lumbar Exercises gentle stretching for lower trunk rotation with goal post arms      Shoulder Exercises: Supine   Other Supine Exercises dowel rod stretches for shoulder flexion and abduction x 5-10 reps      Shoulder Exercises: Therapy Ball   Flexion 10 reps   from sitting with green ball    Right/Left 10 reps  from sitting with green ball      Shoulder Exercises: ROM/Strengthening   Other ROM/Strengthening Exercises bilaterall arms up the wall stretches straight up and [place] to each side      Manual Therapy   Manual Therapy Passive ROM   Passive ROM to right shoulder for abduction and flexion (er not really needed)                PT Education - 11/18/15 1803    Education provided Yes   Education Details ABC class packet for exercises; reviewed lymphedmea risk reduction    Person(s) Educated Patient   Methods Explanation;Demonstration   Comprehension Verbalized understanding                [facility] Goals - 11/18/15 1419      CC Long Term Goal  #1   Title Patient to independently verbalize lymphedema risk reduction practices   Status Achieved     CC Long Term Goal  #2   Title Patient to be independent in a home exercise program for strengthening and stretching   Baseline indpendent in stretching home program 11/18/2015   Status On-going     CC Long Term Goal  #3   Title Patient to demonstrate 170 degrees of right shoulder flexion to allow her to reach items overhead   Baseline 126, 160 degrees 11//14/2017   Status On-going     CC Long Term Goal  #4   Title Patient to demonstrate 165 degrees of right shoulder abduction to allow her to reach items out to sides   Baseline 100 at eval, 163 degees on 11/18/2015   Status On-going     CC Long Term Goal  #5   Title Patient to demonstrate 65 degrees of right shoulder internal rotation to allow her to return to prior level of function            Plan - 11/18/15 1805    Clinical Impression Statement Pt has nearly acheived her goals for shoulder ROM except for  internal roation as that causes discomfort at her right expander ( goal deferred)  She is ready to learn the strength ABC program next visit.    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   Consulted and Agree with Plan of Care Patient      Patient will benefit from skilled therapeutic intervention in order to improve the following deficits and impairments:  Increased edema, Decreased knowledge of precautions, Decreased range of  motion, Decreased strength, Impaired UE functional use, Pain  Visit Diagnosis: Stiffness of right shoulder, not elsewhere classified  Stiffness of left shoulder, not elsewhere classified  Localized edema     Problem List Patient Active Problem List   Diagnosis Date Noted  . Genetic testing 11/17/2015  . Malignant neoplasm of upper-outer quadrant of right breast in female, estrogen receptor positive ([place]) 10/09/2015  . Breast cancer of upper-outer quadrant of right female breast ([place]) 09/12/2015   Donato Heinz. Owens Shark PT  [place] Levo 11/18/2015, 6:10 PM  [place], [place], [address] Phone: [phone]   Fax:  [phone]  Name: ANNALEIGH STEINMEYER MRN: [id number] Date of Birth: 1965-11-08

## 2015-11-18 NOTE — Progress Notes (Signed)
Paperwork given back, advised poss Dr Jana Hakim should probably fill out instead, sent to Dr Magrinat's office

## 2015-11-19 ENCOUNTER — Other Ambulatory Visit (HOSPITAL_BASED_OUTPATIENT_CLINIC_OR_DEPARTMENT_OTHER): Payer: 59

## 2015-11-19 ENCOUNTER — Other Ambulatory Visit: Payer: 59

## 2015-11-19 ENCOUNTER — Encounter: Payer: Self-pay | Admitting: *Deleted

## 2015-11-19 ENCOUNTER — Ambulatory Visit (HOSPITAL_BASED_OUTPATIENT_CLINIC_OR_DEPARTMENT_OTHER): Payer: 59

## 2015-11-19 ENCOUNTER — Ambulatory Visit (HOSPITAL_BASED_OUTPATIENT_CLINIC_OR_DEPARTMENT_OTHER): Payer: 59 | Admitting: Oncology

## 2015-11-19 ENCOUNTER — Ambulatory Visit: Payer: 59

## 2015-11-19 VITALS — BP 125/70 | HR 81 | Temp 97.7°F | Resp 16 | Ht 67.0 in | Wt 189.3 lb

## 2015-11-19 DIAGNOSIS — Z170 Estrogen receptor positive status [ER+]: Principal | ICD-10-CM

## 2015-11-19 DIAGNOSIS — Z006 Encounter for examination for normal comparison and control in clinical research program: Secondary | ICD-10-CM | POA: Diagnosis not present

## 2015-11-19 DIAGNOSIS — C50411 Malignant neoplasm of upper-outer quadrant of right female breast: Secondary | ICD-10-CM

## 2015-11-19 DIAGNOSIS — Z5111 Encounter for antineoplastic chemotherapy: Secondary | ICD-10-CM

## 2015-11-19 LAB — COMPREHENSIVE METABOLIC PANEL
ALBUMIN: 3.9 g/dL (ref 3.5–5.0)
ALK PHOS: 93 U/L (ref 40–150)
ALT: 14 U/L (ref 0–55)
ANION GAP: 9 meq/L (ref 3–11)
AST: 16 U/L (ref 5–34)
BUN: 11.9 mg/dL (ref 7.0–26.0)
CALCIUM: 9.2 mg/dL (ref 8.4–10.4)
CHLORIDE: 107 meq/L (ref 98–109)
CO2: 25 mEq/L (ref 22–29)
CREATININE: 0.7 mg/dL (ref 0.6–1.1)
EGFR: >90 mL/min/{1.73_m2} (ref >90)
Glucose: 100 mg/dl (ref 70–140)
POTASSIUM: 4.2 meq/L (ref 3.5–5.1)
Sodium: 141 mEq/L (ref 136–145)
Total Bilirubin: 0.4 mg/dL (ref 0.20–1.20)
Total Protein: 7 g/dL (ref 6.4–8.3)

## 2015-11-19 LAB — CBC WITH DIFFERENTIAL/PLATELET
BASO%: 0.6 % (ref 0.0–2.0)
BASOS ABS: 0 10*3/uL (ref 0.0–0.1)
EOS ABS: 0.1 10*3/uL (ref 0.0–0.5)
EOS%: 0.7 % (ref 0.0–7.0)
HEMATOCRIT: 35.5 % (ref 34.8–46.6)
HGB: 12.1 g/dL (ref 11.6–15.9)
LYMPH#: 1.1 10*3/uL (ref 0.9–3.3)
LYMPH%: 15.3 % (ref 14.0–49.7)
MCH: 31 pg (ref 25.1–34.0)
MCHC: 34.1 g/dL (ref 31.5–36.0)
MCV: 91 fL (ref 79.5–101.0)
MONO#: 0.6 10*3/uL (ref 0.1–0.9)
MONO%: 8.1 % (ref 0.0–14.0)
NEUT#: 5.4 10*3/uL (ref 1.5–6.5)
NEUT%: 75.3 % (ref 38.4–76.8)
PLATELETS: 157 10*3/uL (ref 145–400)
RBC: 3.9 10*6/uL (ref 3.70–5.45)
RDW: 12.4 % (ref 11.2–14.5)
WBC: 7.1 10*3/uL (ref 3.9–10.3)

## 2015-11-19 MED ORDER — DOXORUBICIN HCL CHEMO IV INJECTION 2 MG/ML
60.0000 mg/m2 | Freq: Once | INTRAVENOUS | Status: AC
  Administered 2015-11-19: 120 mg via INTRAVENOUS
  Filled 2015-11-19: qty 60

## 2015-11-19 MED ORDER — HEPARIN SOD (PORK) LOCK FLUSH 100 UNIT/ML IV SOLN
500.0000 [IU] | Freq: Once | INTRAVENOUS | Status: AC | PRN
  Administered 2015-11-19: 500 [IU]
  Filled 2015-11-19: qty 5

## 2015-11-19 MED ORDER — PALONOSETRON HCL INJECTION 0.25 MG/5ML
0.2500 mg | Freq: Once | INTRAVENOUS | Status: AC
  Administered 2015-11-19: 0.25 mg via INTRAVENOUS

## 2015-11-19 MED ORDER — VALACYCLOVIR HCL 1 G PO TABS: 1000.0000 mg | ORAL_TABLET | Freq: Every day | ORAL | 3 refills | Status: DC

## 2015-11-19 MED ORDER — SODIUM CHLORIDE 0.9 % IV SOLN
Freq: Once | INTRAVENOUS | Status: AC
Start: 2015-11-19 — End: 2015-11-19
  Administered 2015-11-19: 10:00:00 via INTRAVENOUS

## 2015-11-19 MED ORDER — SODIUM CHLORIDE 0.9% FLUSH
10.0000 mL | INTRAVENOUS | Status: DC | PRN
  Administered 2015-11-19: 10 mL
  Filled 2015-11-19: qty 10

## 2015-11-19 MED ORDER — PALONOSETRON HCL INJECTION 0.25 MG/5ML
INTRAVENOUS | Status: AC
  Filled 2015-11-19: qty 5

## 2015-11-19 MED ORDER — SODIUM CHLORIDE 0.9 % IV SOLN
600.0000 mg/m2 | Freq: Once | INTRAVENOUS | Status: AC
  Administered 2015-11-19: 1200 mg via INTRAVENOUS
  Filled 2015-11-19: qty 60

## 2015-11-19 MED ORDER — SODIUM CHLORIDE 0.9 % IV SOLN
Freq: Once | INTRAVENOUS | Status: AC
  Administered 2015-11-19: 10:00:00 via INTRAVENOUS
  Filled 2015-11-19: qty 5

## 2015-11-19 MED ORDER — PEGFILGRASTIM 6 MG/0.6ML ~~LOC~~ PSKT
6.0000 mg | PREFILLED_SYRINGE | Freq: Once | SUBCUTANEOUS | Status: AC
  Administered 2015-11-19: 6 mg via SUBCUTANEOUS
  Filled 2015-11-19: qty 0.6

## 2015-11-19 NOTE — Progress Notes (Signed)
Patient Care Team: No Pcp Per Patient as PCP - General (General Practice)  DIAGNOSIS:  Encounter Diagnoses  Name Primary?  . Malignant neoplasm of upper-outer quadrant of right breast in female, estrogen receptor positive (Melanie Barber) Yes  . Malignant neoplasm of upper-outer quadrant of right female breast, unspecified estrogen receptor status (Melanie Barber)     SUMMARY OF ONCOLOGIC HISTORY:   Breast cancer of upper-outer quadrant of right female breast (Melanie Barber)   09/02/2015 Mammogram    Right breast UOQ: 2 masses 3 cm apart, U/S they measured 3.4 cm, larger mass at 1.6 cm, in addition, 1 cm mass at 9:00 and 0.9 cm mass at 8:30( could be intramammary LN); right axillary LN 11 mm; microcalcs 10 cm span       09/09/2015 Initial Diagnosis    Right breast biopsy OUQ: DCIS with calcs and necrosis ER 95%, PR 80%; 9:30 position 5cmfn: IDC with DCIS ER 95%, PR 65%, Ki-67 20%,; right biopsy 8:30 position 3cmfn ER 95%, PR 5%, Ki-67 10%: intramammary LN with IDC      09/12/2015 Breast MRI    Multicentric right breast cancer cluster of enhancing masses 4.1 x 2.2 x 2.6 cm, LOQ: 1.1 cm enhancing mass previously biopsied, subcutaneous low right breast 2 cm mass not biopsied, multiple other small enhancing masses, 1 cm right axillary lymph node      10/01/2015 Surgery    Right mastectomy: IDC grade 2, multifocal largest 2.5 cm, with high-grade DCIS, broadly present at  anterior margin and inferior margin 2/5 LN positive, LVI Present,  Left mastectomy: ALH, ER 95%, PR 5-65%, HER-2 negative, Ki-67 10-20%, T2 N1 a stage IIB      11/05/2015 -  Chemotherapy    Adjuvant chemotherapy with dose dense Adriamycin and Cytoxan 4 followed by Abraxane weekly 12       CHIEF COMPLIANT: Cycle 2 day 1 dose dense Adriamycin and Cytoxan  INTERVAL HISTORY: Melanie Barber is a 50 year old [place], [place] woman followed by Dr. Lindi Barber for a right-sidedd T2 N1, stage IIB invasive ductal carcinoma, grade 2, estrogen and progesterone receptor  positive, HER-2 negative, with a low proliferation fraction but Mammaprint high risk. She is here today accompanied by her friend Melanie Barber for her second of 4 planned cycles of doxorubicin and cyclophosphamide given in dose dense fashion, to be followed by weekly Abraxane 12.    REVIEW OF SYSTEMS:   She did generally well with her first cycle except for nausea. She never did actually vomit but was quite nauseated and started taking Zofran on day 3. Note is that she is not on dexamethasone because she refuses steroids. She did take Compazine with some relief. She of course had decreased energy but manages to walk 3-6 miles a day. She had slight constipation which she treated with Dulcolax. She had one fever sore in her mouth. She is now waking up 2 or 3 times a night which is a different pattern for her. She does have lorazepam available and uses it sometimes. In general she doesn't like to take any medication. Aside from these issues a detailed review of systems today was stable  I have reviewed the past medical history, past surgical history, social history and family history with the patient and they are unchanged from previous note.  ALLERGIES:  is allergic to keflex [cephalexin].  MEDICATIONS:  Current Outpatient Prescriptions  Medication Sig Dispense Refill  . Atorvastatin Calcium (INVESTIGATIONAL ATORVASTATIN/PLACEBO) 40 MG tablet [facility] [id number] Take 1 tablet by mouth daily. Take 2  doses (these doses must be 12 hours apart) prior to first chemotherapy treatment. Then take 1 tablet daily by mouth with or without food. 180 tablet 0  . ibuprofen (ADVIL,MOTRIN) 200 MG tablet Take 400 mg by mouth every 6 (six) hours as needed for mild pain.    Marland Kitchen levonorgestrel (MIRENA) 20 MCG/24HR IUD 1 each by Intrauterine route once.    . lidocaine-prilocaine (EMLA) cream Apply to affected area once 30 g 3  . LORazepam (ATIVAN) 0.5 MG tablet Take 1 tablet (0.5 mg total) by mouth every 6 (six) hours as  needed (Nausea or vomiting). (Patient not taking: Reported on 11/04/2015) 30 tablet 0  . Multiple Vitamin (MULTIVITAMIN WITH MINERALS) TABS tablet Take 1 tablet by mouth daily.    . ondansetron (ZOFRAN) 8 MG tablet Take 1 tablet (8 mg total) by mouth 2 (two) times daily as needed. Start on the third day after chemotherapy. (Patient not taking: Reported on 11/04/2015) 30 tablet 1  . prochlorperazine (COMPAZINE) 10 MG tablet Take 1 tablet (10 mg total) by mouth every 6 (six) hours as needed (Nausea or vomiting). (Patient not taking: Reported on 11/04/2015) 30 tablet 1   No current facility-administered medications for this visit.    Facility-Administered Medications Ordered in Other Visits  Medication Dose Route Frequency Provider Last Rate Last Dose  . cyclophosphamide (CYTOXAN) 1,200 mg in sodium chloride 0.9 % 250 mL chemo infusion  600 mg/m2 (Treatment Plan Recorded) Intravenous Once Melanie Lose, MD      . heparin lock flush 100 unit/mL  500 Units Intracatheter Once PRN Melanie Lose, MD      . pegfilgrastim (NEULASTA ONPRO KIT) injection 6 mg  6 mg Subcutaneous Once Melanie Lose, MD      . sodium chloride flush (NS) 0.9 % injection 10 mL  10 mL Intracatheter PRN Melanie Lose, MD        PHYSICAL EXAMINATION: ECOG PERFORMANCE STATUS: 1 - Symptomatic but completely ambulatory  Vitals:   11/19/15 0842  BP: 125/70  Pulse: 81  Resp: 16  Temp: 97.7 F (36.5 C)   Filed Weights   11/19/15 0842  Weight: 189 lb 4.8 oz (85.9 kg)    Sclerae unicteric, pupils round and equal Oropharynx clear and moist-- no thrush or other lesions noted today No cervical or supraclavicular adenopathy Lungs no rales or rhonchi Heart regular rate and rhythm Abd soft, nontender, positive bowel sounds MSK no focal spinal tenderness, no upper extremity lymphedema Neuro: nonfocal, well oriented, appropriate affect Breasts: Deferred    LABORATORY DATA:  I have reviewed the data as listed   Chemistry        Component Value Date/Time   NA 141 11/19/2015 0832   K 4.2 11/19/2015 0832   CL 105 09/26/2015 1330   CO2 25 11/19/2015 0832   BUN 11.9 11/19/2015 0832   CREATININE 0.7 11/19/2015 0832      Component Value Date/Time   CALCIUM 9.2 11/19/2015 0832   ALKPHOS 93 11/19/2015 0832   AST 16 11/19/2015 0832   ALT 14 11/19/2015 0832   BILITOT 0.40 11/19/2015 0832      Lab Results  Component Value Date   WBC 7.1 11/19/2015   HGB 12.1 11/19/2015   HCT 35.5 11/19/2015   MCV 91.0 11/19/2015   PLT 157 11/19/2015   NEUTROABS 5.4 11/19/2015   ASSESSMENT & PLAN:  Breast cancer of upper-outer quadrant of right female breast [facility]) Bilateral mastectomies 10/01/2015 With reconstruction Right mastectomy: IDC grade 2, multifocal largest 2.5  cm, with high-grade DCIS, broadly present at  anterior margin and inferior margin 2/5 LN positive, LVI Present,   Left mastectomy: ALH, ER 95%, PR 5-65%, HER-2 negative, Ki-67 10-20%,  Pathologic stage: T2 N1 a stage IIB Mammaprint: High risk CT-CAP and bone scan: No evidence of metastatic disease ----------------------------------------------------------------------------------------------------------------------------------------------------------------- Treatment plan: 1. Because of lymph node involvement I discussed the risks and benefits of doing axillary lymph node dissection. With the advent of chemotherapy and adjuvant radiation, axillary lymph node dissection has not shown to have substantial benefit. 2. Adjuvant chemotherapy with dose dense Adriamycin and Cytoxan 4 followed by Abraxane weekly 12 ( patient is intolerant to steroids and hence we cannot use paclitaxel) 3. Followed by adjuvant radiation therapy 4. Followed by adjuvant antiestrogen therapy with tamoxifen 10 years  PREVENT trial: [id number]  ------------------------------------------------------------------------------------------------------------------------------- Current  treatment: Cycle 1 day 1 dose dense Adriamycin Cytoxan Antiemetics were reviewed Chemotherapy consent obtained Chemotherapy education completed Echocardiogram 10/17/2015: EF 55-60% Closely monitoring for chemotherapy toxicities. Return to clinic in one week for Blood count check and in 2 weeks for cycle 2 chemotherapy. She will see Dr. Jana Hakim as I'm out of town that day.  No orders of the defined types were placed in this encounter.  She did generally well with her first cycle of chemotherapy and her nadir ANC was 0.5 which is optimal. Accordingly we are proceeding with cycle 2 with no changes in dosage  We discussed her using dexamethasone for nausea but she really hates steroids. Accordingly she will lean on the scene and she will take it 4 times a day beginning the evening of treatment and continuing for at least 3 days. She can add Zofran on day 3. She is already on stool softeners for her constipation. I am adding Valtrex for her to take for the next 7 days and repeat with each cycle of doxorubicin and cyclophosphamide. Otherwise I think she is tolerating her treatment remarkably well.  She has not yet started to Barber her hair but that is going to happen within the next few days. She is aware of this and "is prepared".  She is already scheduled for labs, treatment, and a visit with Dr. Sonny Dandy in 2 weeks. She will call with any problems that may develop before then.    Chauncey Cruel, MD 11/19/15

## 2015-11-19 NOTE — Progress Notes (Signed)
11/19/15 at 9:18apm - PREVENT follow up visit - Cycle 2, day 1 AC.  The research nurse met with the pt briefly today before her cycle 2, day 1 AC treatment.  The pt said that she is tolerating her study drug well with no new adverse events.  She said that she is recording her daily dose of study drug on her medication diaries.  The pt was thanked for her support and compliance on the study.  The pt was reminded to return her October medication diary at her next visit. Brion Aliment RN, BSN, CCRP Clinical Research Nurse 11/19/2015 9:27 AM

## 2015-11-19 NOTE — Addendum Note (Signed)
Addended by: Tora Kindred on: 11/19/2015 04:17 PM   Modules accepted: Orders

## 2015-11-19 NOTE — Patient Instructions (Signed)
Melanie Barber Discharge Instructions for Patients Receiving Chemotherapy  Today you received the following chemotherapy agents Cytoxan and Adriamycin.  To help prevent nausea and vomiting after your treatment, we encourage you to take your nausea medication as directed. NO ZOFRAN FOR 3 DAYS.   If you develop nausea and vomiting that is not controlled by your nausea medication, call the clinic.   BELOW ARE SYMPTOMS THAT SHOULD BE REPORTED IMMEDIATELY:  *FEVER GREATER THAN 100.5 F  *CHILLS WITH OR WITHOUT FEVER  NAUSEA AND VOMITING THAT IS NOT CONTROLLED WITH YOUR NAUSEA MEDICATION  *UNUSUAL SHORTNESS OF BREATH  *UNUSUAL BRUISING OR BLEEDING  TENDERNESS IN MOUTH AND THROAT WITH OR WITHOUT PRESENCE OF ULCERS  *URINARY PROBLEMS  *BOWEL PROBLEMS  UNUSUAL RASH Items with * indicate a potential emergency and should be followed up as soon as possible.  Feel free to call the clinic you have any questions or concerns. The clinic phone number is [phone].  Please show the [place] at check-in to the Emergency Department and triage nurse.

## 2015-11-20 ENCOUNTER — Encounter: Payer: 59 | Admitting: Physical Therapy

## 2015-11-25 ENCOUNTER — Encounter: Payer: Self-pay | Admitting: *Deleted

## 2015-11-25 ENCOUNTER — Ambulatory Visit: Payer: 59 | Admitting: Physical Therapy

## 2015-11-25 DIAGNOSIS — M25611 Stiffness of right shoulder, not elsewhere classified: Secondary | ICD-10-CM | POA: Diagnosis not present

## 2015-11-25 DIAGNOSIS — R600 Localized edema: Secondary | ICD-10-CM | POA: Diagnosis not present

## 2015-11-25 DIAGNOSIS — M25612 Stiffness of left shoulder, not elsewhere classified: Secondary | ICD-10-CM | POA: Diagnosis not present

## 2015-11-25 NOTE — Therapy (Signed)
[place], [place], [address] Phone: [phone]   Fax:  [phone]  Physical Therapy Treatment  Patient Details  Name: Melanie Barber MRN: [id number] Date of Birth: Sep 03, 1965 Referring Provider: [place]  Encounter Date: 11/25/2015      PT End of Session - 11/25/15 1443    Visit Number 4   Number of Visits 9   Date for PT Re-Evaluation 12/02/15   PT Start Time 1346   PT Stop Time 1433   PT Time Calculation (min) 47 min   Activity Tolerance Patient tolerated treatment well   Behavior During Therapy [facility] for tasks assessed/performed      Past Medical History:  Diagnosis Date  . Arthritis    right knee (10/01/2015)  . Cancer of right breast ([place])   . Migraine    "once when I was going thru a divorce; called them cluster" (10/01/2015)  . PONV (postoperative nausea and vomiting)   . Sciatic leg pain    left leg    Past Surgical History:  Procedure Laterality Date  . BREAST BIOPSY Right   . BREAST CYST ASPIRATION Right   . BREAST RECONSTRUCTION WITH PLACEMENT OF TISSUE EXPANDER AND FLEX HD (ACELLULAR HYDRATED DERMIS) Bilateral 10/01/2015  . BREAST RECONSTRUCTION WITH PLACEMENT OF TISSUE EXPANDER AND FLEX HD (ACELLULAR HYDRATED DERMIS) Bilateral 10/01/2015   Procedure: BREAST RECONSTRUCTION WITH PLACEMENT OF TISSUE EXPANDER AND CORTIVA;  Surgeon: Irene Limbo, MD;  Location: [place];  Service: Plastics;  Laterality: Bilateral;  . [place]   growth removed  . MASTECTOMY Left 10/01/2015   NIPPLE SPARING  . MASTECTOMY COMPLETE / SIMPLE W/ SENTINEL NODE BIOPSY Right 10/01/2015   NIPPLE SPARING  . NASAL/SINUS ENDOSCOPY Bilateral 2005  . NIPPLE SPARING MASTECTOMY/SENTINAL LYMPH NODE BIOPSY/RECONSTRUCTION/PLACEMENT OF TISSUE EXPANDER Bilateral 10/01/2015   Procedure: BILATERAL NIPPLE SPARING MASTECTOMY WITH RIGHT SENTINAL LYMPH NODE BIOPSY;  Surgeon: Rolm Bookbinder, MD;  Location:  [place];  Service: General;  Laterality: Bilateral;  . PORTACATH PLACEMENT Right 10/01/2015  . PORTACATH PLACEMENT Right 10/01/2015   Procedure: INSERTION PORT-A-CATH;  Surgeon: Rolm Bookbinder, MD;  Location: [place];  Service: General;  Laterality: Right;  . TOE SURGERY Left 1990   "bone spur on great toe"  . TONSILLECTOMY  1977  . ULTRASOUND GUIDANCE FOR VASCULAR ACCESS Right 10/01/2015   Procedure: ULTRASOUND GUIDANCE;  Surgeon: Rolm Bookbinder, MD;  Location: [place];  Service: General;  Laterality: Right;  . WISDOM TOOTH EXTRACTION      There were no vitals filed for this visit.      Subjective Assessment - 11/25/15 1347    Subjective "I shaved my hair off and got a wig." Much more fatigued with the second chemo treatment.  I did walk 3.5 miles yesterday; that was the first day I walked since the treatment.   Currently in Pain? No/denies                         [facility] Adult PT Treatment/Exercise - 11/25/15 0001      Exercises   Exercises Other Exercises     Shoulder Exercises: Standing   Other Standing Exercises Instructed patient in all of strength ABC program, and she performed the following: all stretches x 15 seconds each side; all core exercises x 10 reps each; all resistance exercises x 10 each with either 2 lb. resistance (for arm exercises) or no added resistance (for body weight exercises such as  squats, heel raises, step ups).  The only substitutions were seated hip rotation instead of butterfly stretches and standing with hands on countertop for superwoman instead of doing this in quadruped.                  PT Education - 11/25/15 1443    Education provided Yes   Education Details strength ABC program   Person(s) Educated Patient   Methods Explanation;Demonstration;Verbal cues;Handout   Comprehension Verbalized understanding;Returned demonstration                [facility] Goals - 11/25/15 1446      CC Long Term Goal  #1    Title Patient to independently verbalize lymphedema risk reduction practices   Status Achieved     CC Long Term Goal  #2   Title Patient to be independent in a home exercise program for strengthening and stretching   Status Partially Met     CC Long Term Goal  #3   Title Patient to demonstrate 170 degrees of right shoulder flexion to allow her to reach items overhead   Status On-going     CC Long Term Goal  #4   Title Patient to demonstrate 165 degrees of right shoulder abduction to allow her to reach items out to sides   Status On-going     CC Long Term Goal  #5   Title Patient to demonstrate 65 degrees of right shoulder internal rotation to allow her to return to prior level of function   Status On-going            Plan - 11/25/15 1443    Clinical Impression Statement Patient did well with learning strength ABC program.  She will make substitutions prn, mainly for her right knee, which has significant osteoarthritis.  She is doing well toward her goals, and because of this as well as because of upcoming weekly chemo, we will continue with her at decreased frequency, just 1x/week.   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 Genetic testing 11/17/2015  . Malignant neoplasm of upper-outer quadrant of right breast in female, estrogen receptor positive ([place]) 10/09/2015  . Breast cancer of upper-outer quadrant of right female breast ([place]) 09/12/2015    SALISBURY,DONNA 11/25/2015, 2:47 PM  [place], [place], [address] Phone: [phone]   Fax:  [phone]  Name: Melanie Barber MRN: [id number] Date of Birth: 1965-02-07   Serafina Royals, PT 11/25/15  2:47 PM

## 2015-11-26 ENCOUNTER — Encounter: Payer: Self-pay | Admitting: Hematology and Oncology

## 2015-11-26 ENCOUNTER — Encounter: Payer: Self-pay | Admitting: Oncology

## 2015-11-26 NOTE — Progress Notes (Signed)
Left msg for pt to return my call to discuss copay assistance w/ PAF and the Neulasta First Step program and offer the [employer].

## 2015-11-26 NOTE — Progress Notes (Signed)
Pt returned my call so I introduced myself as her FA.  Informed her of copay assistance w/ PAF and Neulasta.  I will enroll her in the Neulasta First Step program on 12/03/15 and PAF in Jan 2018 when her insurance renews.  Also informed her of the [employer] and went over what it covers.  She would like to apply so she will bring in a letter of support since she's not receiving income at this time.

## 2015-12-02 ENCOUNTER — Ambulatory Visit: Payer: 59 | Admitting: Physical Therapy

## 2015-12-02 DIAGNOSIS — M25612 Stiffness of left shoulder, not elsewhere classified: Secondary | ICD-10-CM

## 2015-12-02 DIAGNOSIS — M25611 Stiffness of right shoulder, not elsewhere classified: Secondary | ICD-10-CM | POA: Diagnosis not present

## 2015-12-02 DIAGNOSIS — R600 Localized edema: Secondary | ICD-10-CM

## 2015-12-02 NOTE — Patient Instructions (Signed)
[email or web address] Lymphatic flow series with Shoosh Lettick Crotzer 

## 2015-12-02 NOTE — Assessment & Plan Note (Signed)
Bilateral mastectomies 10/01/2015 With reconstruction Right mastectomy: IDC grade 2, multifocal largest 2.5 cm, with high-grade DCIS, broadly present at anterior margin and inferior margin 2/5 LN positive, LVI Present,  Left mastectomy: ALH, ER 95%, PR 5-65%, HER-2 negative, Ki-67 10-20%,  Pathologic stage: T2 N1 a stage IIB Mammaprint: High risk CT-CAP and bone scan: No evidence of metastatic disease ----------------------------------------------------------------------------------------------------------------------------------------------------------------- Treatment plan: 1. Because of lymph node involvement I discussed the risks and benefits of doing axillary lymph node dissection. With the advent of chemotherapy and adjuvant radiation, axillary lymph node dissection has not shown to have substantial benefit. 2. Adjuvant chemotherapy with dose dense Adriamycin and Cytoxan 4 followed by Abraxane weekly 12 ( patient is intolerant to steroids and hence we cannot use paclitaxel) 3. Followed by adjuvant radiation therapy 4. Followed by adjuvant antiestrogen therapy with tamoxifen 10 years  PREVENT trial: [id number]  ------------------------------------------------------------------------------------------------------------------------------- Current treatment: Cycle 3 day 1 dose dense Adriamycin Cytoxan Chemo Toxicities:  RTC in 2 weeks for cycle 4

## 2015-12-02 NOTE — Therapy (Signed)
[place], [place], [address] Phone: [phone]   Fax:  [phone]  Physical Therapy Treatment  Patient Details  Name: Melanie Barber MRN: [id number] Date of Birth: 02-Apr-1965 Referring Provider: Thiammappa   Encounter Date: 12/02/2015      PT End of Session - 12/02/15 1357    Visit Number 5   Number of Visits 13   Date for PT Re-Evaluation 01/01/16   PT Start Time 1300   PT Stop Time 1345   PT Time Calculation (min) 45 min   Activity Tolerance Patient tolerated treatment well   Behavior During Therapy [facility] for tasks assessed/performed      Past Medical History:  Diagnosis Date  . Arthritis    right knee (10/01/2015)  . Cancer of right breast ([place])   . Migraine    "once when I was going thru a divorce; called them cluster" (10/01/2015)  . PONV (postoperative nausea and vomiting)   . Sciatic leg pain    left leg    Past Surgical History:  Procedure Laterality Date  . BREAST BIOPSY Right   . BREAST CYST ASPIRATION Right   . BREAST RECONSTRUCTION WITH PLACEMENT OF TISSUE EXPANDER AND FLEX HD (ACELLULAR HYDRATED DERMIS) Bilateral 10/01/2015  . BREAST RECONSTRUCTION WITH PLACEMENT OF TISSUE EXPANDER AND FLEX HD (ACELLULAR HYDRATED DERMIS) Bilateral 10/01/2015   Procedure: BREAST RECONSTRUCTION WITH PLACEMENT OF TISSUE EXPANDER AND CORTIVA;  Surgeon: Irene Limbo, MD;  Location: [place];  Service: Plastics;  Laterality: Bilateral;  . [place]   growth removed  . MASTECTOMY Left 10/01/2015   NIPPLE SPARING  . MASTECTOMY COMPLETE / SIMPLE W/ SENTINEL NODE BIOPSY Right 10/01/2015   NIPPLE SPARING  . NASAL/SINUS ENDOSCOPY Bilateral 2005  . NIPPLE SPARING MASTECTOMY/SENTINAL LYMPH NODE BIOPSY/RECONSTRUCTION/PLACEMENT OF TISSUE EXPANDER Bilateral 10/01/2015   Procedure: BILATERAL NIPPLE SPARING MASTECTOMY WITH RIGHT SENTINAL LYMPH NODE BIOPSY;  Surgeon: Rolm Bookbinder, MD;   Location: [place];  Service: General;  Laterality: Bilateral;  . PORTACATH PLACEMENT Right 10/01/2015  . PORTACATH PLACEMENT Right 10/01/2015   Procedure: INSERTION PORT-A-CATH;  Surgeon: Rolm Bookbinder, MD;  Location: [place];  Service: General;  Laterality: Right;  . TOE SURGERY Left 1990   "bone spur on great toe"  . TONSILLECTOMY  1977  . ULTRASOUND GUIDANCE FOR VASCULAR ACCESS Right 10/01/2015   Procedure: ULTRASOUND GUIDANCE;  Surgeon: Rolm Bookbinder, MD;  Location: [place];  Service: General;  Laterality: Right;  . WISDOM TOOTH EXTRACTION      There were no vitals filed for this visit.      Subjective Assessment - 12/02/15 1305    Subjective "I have a bit of a cold."  She walked 6.5 miles one day and sometimes she chooses a more level or hiilly route depending on how she does. "I want to keep coming" She did one set of the strength ABC program and had not problems with that    Pertinent History Right mastectomy: IDC grade 2, multifocal largest 2.5 cm, with high-grade DCIS, broadly present at  anterior margin and inferior margin 2/5 LN positive, LVI Present,  Left mastectomy: ALH, ER 95%, PR 5-65%, HER-2 negative, Ki-67 10-20%, T2 N1 a stage IIB, patient to begin chemotherapy 11/05/15 then radiation once chemo is completed in approx 5 months   Patient Stated Goals improve ROM   Currently in Pain? No/denies            [facility] PT Assessment - 12/02/15 0001  Assessment   Medical Diagnosis right breast cancer   Referring Provider Thiammappa    Onset Date/Surgical Date 10/01/15   Hand Dominance Right     AROM   Right Shoulder Flexion 165 Degrees   Right Shoulder ABduction 165 Degrees                     [facility] Adult PT Treatment/Exercise - 12/02/15 0001      Elbow Exercises   Elbow Extension Strengthening;Both   Bar Weights/Barbell (Elbow Extension) 2 lbs  skull crusher in sidelying      Shoulder Exercises: Supine   Other Supine Exercises dowel rod  stretches for shoulder flexion and abduction x 5-10 reps    Other Supine Exercises shoulder and anterior chest stretcihing over soft foam roller, along with core activation exercise from this position with unilateral leg raise and opposite arm and leg raises.  Pt with more problems stabalizing with left leg raise      Shoulder Exercises: Seated   Other Seated Exercises lymphatic flow series      Shoulder Exercises: Sidelying   External Rotation Strengthening;Both;10 reps;Weights   External Rotation Weight (lbs) 2 pounds    ABduction AROM;Both;5 reps  deep breaths at top of range    Other Sidelying Exercises small circles with control in each direction, 10 with no weight and 10 with 2# weight.                         [facility] Goals - 12/02/15 1346      CC Long Term Goal  #1   Title Patient to independently verbalize lymphedema risk reduction practices   Status Achieved     CC Long Term Goal  #2   Title Patient to be independent in a home exercise program for strengthening and stretching   Baseline indpendent in stretching home program 11/18/2015, working on [place] program 12/02/2015   Time 4   Period Weeks   Status On-going     CC Long Term Goal  #3   Title Patient to demonstrate 170 degrees of right shoulder flexion to allow her to reach items overhead   Baseline 126, 160 degrees 11//14/2017, 165 on 12/02/2015   Time 4   Period Weeks   Status On-going     CC Long Term Goal  #4   Title Patient to demonstrate 165 degrees of right shoulder abduction to allow her to reach items out to sides   Baseline 100 at eval, 163 degees on 11/18/2015, 165 on 12/02/2015   Time 4   Period Weeks   Status On-going     CC Long Term Goal  #5   Title Patient to demonstrate 65 degrees of right shoulder internal rotation to allow her to return to prior level of function   Baseline 52   Period Weeks   Status On-going            Plan - 12/02/15 1358     Clinical Impression Statement Pt is doing well overall with walking on her own and beginning strengthening.  She has not achieved full shoulder range of motion yet and feels "weak" compared to how she was before surgery and does not feel that she is strong enourgh to return to her job.  Will renew for 4 more weeks to continue strengthening   She wants to continue 1 time per week.    Rehab Potential Good   Clinical Impairments Affecting Rehab  Potential in chemo   PT Frequency 1x / week   PT Duration 4 weeks   PT Treatment/Interventions Passive range of motion;Taping;ADLs/Self Care Home Management;Therapeutic exercise;Patient/family education;Manual lymph drainage;Manual techniques;Orthotic Fit/Training   PT Next Visit Plan measure shoulder internal rotation for goal achievment. review progress on Strength ABC program, teach supine scapular program    PT Home Exercise Plan breast cancer post op exercises, walking, strength ABC program   Consulted and Agree with Plan of Care Patient      Patient will benefit from skilled therapeutic intervention in order to improve the following deficits and impairments:  Increased edema, Decreased knowledge of precautions, Decreased range of motion, Decreased strength, Impaired UE functional use, Pain  Visit Diagnosis: Stiffness of right shoulder, not elsewhere classified - Plan: PT plan of care cert/re-cert  Stiffness of left shoulder, not elsewhere classified - Plan: PT plan of care cert/re-cert  Localized edema - Plan: PT plan of care cert/re-cert     Problem List Patient Active Problem List   Diagnosis Date Noted  . Genetic testing 11/17/2015  . Malignant neoplasm of upper-outer quadrant of right breast in female, estrogen receptor positive ([place]) 10/09/2015  . Breast cancer of upper-outer quadrant of right female breast ([place]) 09/12/2015   Donato Heinz. Owens Shark PT  Norwood Levo 12/02/2015, 2:05 PM  [place], [place], [address] Phone: [phone]   Fax:  [phone]  Name: Melanie Barber MRN: [id number] Date of Birth: 03-30-1965

## 2015-12-03 ENCOUNTER — Encounter: Payer: Self-pay | Admitting: *Deleted

## 2015-12-03 ENCOUNTER — Ambulatory Visit (HOSPITAL_BASED_OUTPATIENT_CLINIC_OR_DEPARTMENT_OTHER): Payer: 59 | Admitting: Hematology and Oncology

## 2015-12-03 ENCOUNTER — Encounter: Payer: Self-pay | Admitting: Hematology and Oncology

## 2015-12-03 ENCOUNTER — Other Ambulatory Visit (HOSPITAL_BASED_OUTPATIENT_CLINIC_OR_DEPARTMENT_OTHER): Payer: 59

## 2015-12-03 ENCOUNTER — Ambulatory Visit (HOSPITAL_BASED_OUTPATIENT_CLINIC_OR_DEPARTMENT_OTHER): Payer: 59

## 2015-12-03 DIAGNOSIS — Z006 Encounter for examination for normal comparison and control in clinical research program: Secondary | ICD-10-CM

## 2015-12-03 DIAGNOSIS — Z170 Estrogen receptor positive status [ER+]: Secondary | ICD-10-CM | POA: Diagnosis not present

## 2015-12-03 DIAGNOSIS — Z5111 Encounter for antineoplastic chemotherapy: Secondary | ICD-10-CM

## 2015-12-03 DIAGNOSIS — C50411 Malignant neoplasm of upper-outer quadrant of right female breast: Secondary | ICD-10-CM

## 2015-12-03 LAB — CBC WITH DIFFERENTIAL/PLATELET
BASO%: 0.2 % (ref 0.0–2.0)
Basophils Absolute: 0 10*3/uL (ref 0.0–0.1)
EOS%: 0.5 % (ref 0.0–7.0)
Eosinophils Absolute: 0.1 10*3/uL (ref 0.0–0.5)
HEMATOCRIT: 32.9 % — AB (ref 34.8–46.6)
HEMOGLOBIN: 11.2 g/dL — AB (ref 11.6–15.9)
LYMPH#: 0.8 10*3/uL — AB (ref 0.9–3.3)
LYMPH%: 6.5 % — ABNORMAL LOW (ref 14.0–49.7)
MCH: 30.5 pg (ref 25.1–34.0)
MCHC: 34 g/dL (ref 31.5–36.0)
MCV: 89.7 fL (ref 79.5–101.0)
MONO#: 0.8 10*3/uL (ref 0.1–0.9)
MONO%: 5.9 % (ref 0.0–14.0)
NEUT%: 86.9 % — ABNORMAL HIGH (ref 38.4–76.8)
NEUTROS ABS: 11.3 10*3/uL — AB (ref 1.5–6.5)
PLATELETS: 172 10*3/uL (ref 145–400)
RBC: 3.67 10*6/uL — ABNORMAL LOW (ref 3.70–5.45)
RDW: 12.6 % (ref 11.2–14.5)
WBC: 13 10*3/uL — AB (ref 3.9–10.3)

## 2015-12-03 LAB — COMPREHENSIVE METABOLIC PANEL
ALBUMIN: 3.8 g/dL (ref 3.5–5.0)
ALK PHOS: 102 U/L (ref 40–150)
ALT: 11 U/L (ref 0–55)
ANION GAP: 8 meq/L (ref 3–11)
AST: 14 U/L (ref 5–34)
BILIRUBIN TOTAL: 0.32 mg/dL (ref 0.20–1.20)
BUN: 9.5 mg/dL (ref 7.0–26.0)
CALCIUM: 9.1 mg/dL (ref 8.4–10.4)
CO2: 27 mEq/L (ref 22–29)
Chloride: 107 mEq/L (ref 98–109)
Creatinine: 0.7 mg/dL (ref 0.6–1.1)
GLUCOSE: 94 mg/dL (ref 70–140)
Potassium: 3.8 mEq/L (ref 3.5–5.1)
SODIUM: 141 meq/L (ref 136–145)
TOTAL PROTEIN: 7.2 g/dL (ref 6.4–8.3)

## 2015-12-03 MED ORDER — HEPARIN SOD (PORK) LOCK FLUSH 100 UNIT/ML IV SOLN
500.0000 [IU] | Freq: Once | INTRAVENOUS | Status: AC | PRN
  Administered 2015-12-03: 500 [IU]
  Filled 2015-12-03: qty 5

## 2015-12-03 MED ORDER — PALONOSETRON HCL INJECTION 0.25 MG/5ML
0.2500 mg | Freq: Once | INTRAVENOUS | Status: AC
  Administered 2015-12-03: 0.25 mg via INTRAVENOUS

## 2015-12-03 MED ORDER — DOXORUBICIN HCL CHEMO IV INJECTION 2 MG/ML
60.0000 mg/m2 | Freq: Once | INTRAVENOUS | Status: AC
  Administered 2015-12-03: 120 mg via INTRAVENOUS
  Filled 2015-12-03: qty 60

## 2015-12-03 MED ORDER — SODIUM CHLORIDE 0.9% FLUSH
10.0000 mL | INTRAVENOUS | Status: DC | PRN
  Administered 2015-12-03: 10 mL
  Filled 2015-12-03: qty 10

## 2015-12-03 MED ORDER — PEGFILGRASTIM 6 MG/0.6ML ~~LOC~~ PSKT
6.0000 mg | PREFILLED_SYRINGE | Freq: Once | SUBCUTANEOUS | Status: AC
  Administered 2015-12-03: 6 mg via SUBCUTANEOUS
  Filled 2015-12-03: qty 0.6

## 2015-12-03 MED ORDER — SODIUM CHLORIDE 0.9 % IV SOLN
600.0000 mg/m2 | Freq: Once | INTRAVENOUS | Status: AC
  Administered 2015-12-03: 1200 mg via INTRAVENOUS
  Filled 2015-12-03: qty 60

## 2015-12-03 MED ORDER — SODIUM CHLORIDE 0.9 % IV SOLN
Freq: Once | INTRAVENOUS | Status: AC
  Administered 2015-12-03: 13:00:00 via INTRAVENOUS

## 2015-12-03 MED ORDER — SODIUM CHLORIDE 0.9 % IV SOLN
Freq: Once | INTRAVENOUS | Status: AC
  Administered 2015-12-03: 13:00:00 via INTRAVENOUS
  Filled 2015-12-03: qty 5

## 2015-12-03 MED ORDER — PALONOSETRON HCL INJECTION 0.25 MG/5ML
INTRAVENOUS | Status: AC
  Filled 2015-12-03: qty 5

## 2015-12-03 NOTE — Progress Notes (Signed)
Patient Care Team: No Pcp Per Patient as PCP - General (General Practice)  DIAGNOSIS:  Encounter Diagnosis  Name Primary?  . Malignant neoplasm of upper-outer quadrant of right breast in female, estrogen receptor positive (Melanie Barber)     SUMMARY OF ONCOLOGIC HISTORY:   Breast cancer of upper-outer quadrant of right female breast (Melanie Barber)   09/02/2015 Mammogram    Right breast UOQ: 2 masses 3 cm apart, U/S they measured 3.4 cm, larger mass at 1.6 cm, in addition, 1 cm mass at 9:00 and 0.9 cm mass at 8:30( could be intramammary LN); right axillary LN 11 mm; microcalcs 10 cm span       09/09/2015 Initial Diagnosis    Right breast biopsy OUQ: DCIS with calcs and necrosis ER 95%, PR 80%; 9:30 position 5cmfn: IDC with DCIS ER 95%, PR 65%, Ki-67 20%,; right biopsy 8:30 position 3cmfn ER 95%, PR 5%, Ki-67 10%: intramammary LN with IDC      09/12/2015 Breast MRI    Multicentric right breast cancer cluster of enhancing masses 4.1 x 2.2 x 2.6 cm, LOQ: 1.1 cm enhancing mass previously biopsied, subcutaneous low right breast 2 cm mass not biopsied, multiple other small enhancing masses, 1 cm right axillary lymph node      10/01/2015 Surgery    Right mastectomy: IDC grade 2, multifocal largest 2.5 cm, with high-grade DCIS, broadly present at  anterior margin and inferior margin 2/5 LN positive, LVI Present,  Left mastectomy: ALH, ER 95%, PR 5-65%, HER-2 negative, Ki-67 10-20%, T2 N1 a stage IIB      11/05/2015 -  Chemotherapy    Adjuvant chemotherapy with dose dense Adriamycin and Cytoxan 4 followed by Abraxane weekly 12       CHIEF COMPLIANT: Cycle 3 Adriamycin and Cytoxan  INTERVAL HISTORY: Melanie Barber is a 50 year old with above-mentioned history of right breast cancer treated with mastectomy and is currently on adjuvant chemotherapy and today's cycle 3 of dose dense Adriamycin Rituxan. She is tolerated cycle 2 fairly well. She had one episode of fever which subsided without any intervention.  Tylenol. She has some blisters on her feet from walking 6 miles. There is no evidence of infection. She takes Zofran and Compazine around the clock to prevent nausea. She does have constipation which is improved with stool softeners and laxatives.  REVIEW OF SYSTEMS:   Constitutional: Denies fevers, chills or abnormal weight loss Eyes: Denies blurriness of vision Ears, nose, mouth, throat, and face: Denies mucositis or sore throat Respiratory: Denies cough, dyspnea or wheezes Cardiovascular: Denies palpitation, chest discomfort Gastrointestinal: Nausea and constipation Skin: Denies abnormal skin rashes Lymphatics: Denies new lymphadenopathy or easy bruising Neurological:Denies numbness, tingling or new weaknesses Behavioral/Psych: Mood is stable, no new changes  Extremities: No lower extremity edema  All other systems were reviewed with the patient and are negative.  I have reviewed the past medical history, past surgical history, social history and family history with the patient and they are unchanged from previous note.  ALLERGIES:  is allergic to keflex [cephalexin].  MEDICATIONS:  Current Outpatient Prescriptions  Medication Sig Dispense Refill  . Atorvastatin Calcium (INVESTIGATIONAL ATORVASTATIN/PLACEBO) 40 MG tablet [facility] [id number] Take 1 tablet by mouth daily. Take 2 doses (these doses must be 12 hours apart) prior to first chemotherapy treatment. Then take 1 tablet daily by mouth with or without food. 180 tablet 0  . ibuprofen (ADVIL,MOTRIN) 200 MG tablet Take 400 mg by mouth every 6 (six) hours as needed for mild  pain.    . levonorgestrel (MIRENA) 20 MCG/24HR IUD 1 each by Intrauterine route once.    . lidocaine-prilocaine (EMLA) cream Apply to affected area once 30 g 3  . LORazepam (ATIVAN) 0.5 MG tablet Take 1 tablet (0.5 mg total) by mouth every 6 (six) hours as needed (Nausea or vomiting). (Patient not taking: Reported on 11/04/2015) 30 tablet 0  . Multiple Vitamin  (MULTIVITAMIN WITH MINERALS) TABS tablet Take 1 tablet by mouth daily.    . ondansetron (ZOFRAN) 8 MG tablet Take 1 tablet (8 mg total) by mouth 2 (two) times daily as needed. Start on the third day after chemotherapy. 30 tablet 1  . prochlorperazine (COMPAZINE) 10 MG tablet Take 1 tablet (10 mg total) by mouth every 6 (six) hours as needed (Nausea or vomiting). 30 tablet 1  . valACYclovir (VALTREX) 1000 MG tablet Take 1 tablet (1,000 mg total) by mouth daily. 30 tablet 3   No current facility-administered medications for this visit.     PHYSICAL EXAMINATION: ECOG PERFORMANCE STATUS: 1 - Symptomatic but completely ambulatory  Vitals:   12/03/15 1114  BP: 133/72  Pulse: 96  Resp: 18  Temp: 98 F (36.7 C)   Filed Weights   12/03/15 1114  Weight: 188 lb 6.4 oz (85.5 kg)    GENERAL:alert, no distress and comfortable SKIN: skin color, texture, turgor are normal, no rashes or significant lesions EYES: normal, Conjunctiva are pink and non-injected, sclera clear OROPHARYNX:no exudate, no erythema and lips, buccal mucosa, and tongue normal  NECK: supple, thyroid normal size, non-tender, without nodularity LYMPH:  no palpable lymphadenopathy in the cervical, axillary or inguinal LUNGS: clear to auscultation and percussion with normal breathing effort HEART: regular rate & rhythm and no murmurs and no lower extremity edema ABDOMEN:abdomen soft, non-tender and normal bowel sounds MUSCULOSKELETAL:no cyanosis of digits and no clubbing  NEURO: alert & oriented x 3 with fluent speech, no focal motor/sensory deficits EXTREMITIES: No lower extremity edema  LABORATORY DATA:  I have reviewed the data as listed   Chemistry      Component Value Date/Time   NA 141 11/19/2015 0832   K 4.2 11/19/2015 0832   CL 105 09/26/2015 1330   CO2 25 11/19/2015 0832   BUN 11.9 11/19/2015 0832   CREATININE 0.7 11/19/2015 0832      Component Value Date/Time   CALCIUM 9.2 11/19/2015 0832   ALKPHOS 93  11/19/2015 0832   AST 16 11/19/2015 0832   ALT 14 11/19/2015 0832   BILITOT 0.40 11/19/2015 0832       Lab Results  Component Value Date   WBC 13.0 (H) 12/03/2015   HGB 11.2 (L) 12/03/2015   HCT 32.9 (L) 12/03/2015   MCV 89.7 12/03/2015   PLT 172 12/03/2015   NEUTROABS 11.3 (H) 12/03/2015    ASSESSMENT & PLAN:  Breast cancer of upper-outer quadrant of right female breast ([place]) Bilateral mastectomies 10/01/2015 With reconstruction Right mastectomy: IDC grade 2, multifocal largest 2.5 cm, with high-grade DCIS, broadly present at anterior margin and inferior margin 2/5 LN positive, LVI Present,  Left mastectomy: ALH, ER 95%, PR 5-65%, HER-2 negative, Ki-67 10-20%,  Pathologic stage: T2 N1 a stage IIB Mammaprint: High risk CT-CAP and bone scan: No evidence of metastatic disease ----------------------------------------------------------------------------------------------------------------------------------------------------------------- Treatment plan: 1. Because of lymph node involvement I discussed the risks and benefits of doing axillary lymph node dissection. With the advent of chemotherapy and adjuvant radiation, axillary lymph node dissection has not shown to have substantial benefit. 2. Adjuvant  chemotherapy with dose dense Adriamycin and Cytoxan 4 followed by Abraxane weekly 12 ( patient is intolerant to steroids and hence we cannot use paclitaxel) 3. Followed by adjuvant radiation therapy 4. Followed by adjuvant antiestrogen therapy with tamoxifen 10 years  PREVENT trial: [id number]  ------------------------------------------------------------------------------------------------------------------------------- Current treatment: Cycle 3 day 1 dose dense Adriamycin Cytoxan Chemo Toxicities: 1. Intermittent nausea for which she takes Zofran and Compazine around-the-clock 2. constipation: Take stool softeners 3. Alopecia 4. Mouth sore that was treated with acyclovir:  Resolved  RTC in 2 weeks for cycle 4  No orders of the defined types were placed in this encounter.  The patient has a good understanding of the overall plan. she agrees with it. she will call with any problems that may develop before the next visit here.   Rulon Eisenmenger, MD 12/03/15

## 2015-12-03 NOTE — Patient Instructions (Signed)
[facility] Discharge Instructions for Patients Receiving Chemotherapy  Today you received the following chemotherapy agents Cytoxan and Adriamycin.   To help prevent nausea and vomiting after your treatment, we encourage you to take your nausea medication as directed.  NO ZOFRAN FOR 3 DAYS.   If you develop nausea and vomiting that is not controlled by your nausea medication, call the clinic.   BELOW ARE SYMPTOMS THAT SHOULD BE REPORTED IMMEDIATELY:  *FEVER GREATER THAN 100.5 F  *CHILLS WITH OR WITHOUT FEVER  NAUSEA AND VOMITING THAT IS NOT CONTROLLED WITH YOUR NAUSEA MEDICATION  *UNUSUAL SHORTNESS OF BREATH  *UNUSUAL BRUISING OR BLEEDING  TENDERNESS IN MOUTH AND THROAT WITH OR WITHOUT PRESENCE OF ULCERS  *URINARY PROBLEMS  *BOWEL PROBLEMS  UNUSUAL RASH Items with * indicate a potential emergency and should be followed up as soon as possible.  Feel free to call the clinic you have any questions or concerns. The clinic phone number is [phone].  Please show the [facility] at check-in to the Emergency Department and triage nurse.

## 2015-12-04 ENCOUNTER — Encounter: Payer: 59 | Admitting: Physical Therapy

## 2015-12-05 ENCOUNTER — Other Ambulatory Visit: Payer: Self-pay | Admitting: Nurse Practitioner

## 2015-12-05 ENCOUNTER — Other Ambulatory Visit: Payer: Self-pay

## 2015-12-05 ENCOUNTER — Telehealth: Payer: Self-pay | Admitting: Nurse Practitioner

## 2015-12-05 DIAGNOSIS — E860 Dehydration: Secondary | ICD-10-CM

## 2015-12-05 DIAGNOSIS — C50411 Malignant neoplasm of upper-outer quadrant of right female breast: Secondary | ICD-10-CM

## 2015-12-05 DIAGNOSIS — Z170 Estrogen receptor positive status [ER+]: Principal | ICD-10-CM

## 2015-12-05 MED ORDER — PROMETHAZINE HCL 25 MG RE SUPP: 25.0000 mg | Freq: Four times a day (QID) | RECTAL | 0 refills | Status: DC | PRN

## 2015-12-05 NOTE — Telephone Encounter (Signed)
Dr. Julieanne Manson received a phone call as the on-call physician for the evening from patient stating that she is having constant nausea, vomiting, and dehydration.  She stated that the Compazine is not working.  She states that the Ativan is not working either.  She states that she is allergic to dexamethasone/Decadron; stating that it makes her very anxious and itch.  She was prescribed a Phenergan suppository to try.  She feels dehydrated today.  This provider called the patient; and the patient informed the provider that she had just used a Phenergan suppository so is anxious to see if it will work.  She confirmed that she does feel dehydrated; would like to return to the [facility] tomorrow morning to receive IV fluid rehydration.  Advised patient that she received Aloxi with her chemotherapy on Wednesday, 12/03/2015; but would be able to receive Zofran on an as-needed basis while at the [facility] on Saturday, 12/06/2015.  This provider has ordered IV fluid rehydration and Zofran on a when necessary basis tomorrow at the cancer center.  She was advised to go directly to the emergency department in the meantime with any new worries or concerns whatsoever.

## 2015-12-05 NOTE — Progress Notes (Signed)
Pt called stating that she had been having severe nausea since her last chemo A/C on wed. Pt states that she had been taking compazine on the clock and had called in earlier to triage and was advised to take ativan 0.5mg . Pt last took it at 12:30pm. Pt states that she has not eaten or drank much in the last 18hrs. Advised that pt needs to go to ED for fluid replacement. Pt is risking dehydration and needs immediate attention. Pt refusing to go to ED at this time. Called on call Dr. Benay Spice and had originally ordered decadron po bid for 2 days. Pt states that she is allergic to steroids because of feeling "jumpy" all the time. Pt states she is unable to tolerate it. Will add it to pt allergy profile. Called Dr. Benay Spice back and obtained order for phenergan suppository 25mg  q6h prn. Pt also advised to stop taking compazine because she cannot take it with phenergan. Told pt to seek medical assistance in an urgent care or emergency dept if symptoms persist and to address hydration. Pt verbalized understanding and will seek assistance asap. Told pt that she may start taking her zofran tomorrow, since the Aloxi will be wearing off. Pt may also use ativan po sublingual to help absorb the medication faster. Pt voiced understanding and will call for any further concerns.

## 2015-12-06 ENCOUNTER — Ambulatory Visit (HOSPITAL_BASED_OUTPATIENT_CLINIC_OR_DEPARTMENT_OTHER): Payer: 59

## 2015-12-06 DIAGNOSIS — E860 Dehydration: Secondary | ICD-10-CM | POA: Diagnosis not present

## 2015-12-06 DIAGNOSIS — C50411 Malignant neoplasm of upper-outer quadrant of right female breast: Secondary | ICD-10-CM

## 2015-12-06 DIAGNOSIS — Z170 Estrogen receptor positive status [ER+]: Principal | ICD-10-CM

## 2015-12-06 MED ORDER — ONDANSETRON HCL 8 MG PO TABS
ORAL_TABLET | ORAL | Status: AC
  Filled 2015-12-06: qty 1

## 2015-12-06 MED ORDER — SODIUM CHLORIDE 0.9% FLUSH
10.0000 mL | INTRAVENOUS | Status: DC | PRN
  Administered 2015-12-06: 10 mL via INTRAVENOUS
  Filled 2015-12-06: qty 10

## 2015-12-06 MED ORDER — SODIUM CHLORIDE 0.9 % IV SOLN
8.0000 mg | Freq: Once | INTRAVENOUS | Status: AC | PRN
  Administered 2015-12-06: 8 mg via INTRAVENOUS

## 2015-12-06 MED ORDER — SODIUM CHLORIDE 0.9 % IV SOLN
INTRAVENOUS | Status: AC
  Administered 2015-12-06: 09:00:00 via INTRAVENOUS

## 2015-12-06 MED ORDER — ONDANSETRON HCL 4 MG/2ML IJ SOLN
INTRAMUSCULAR | Status: AC
  Filled 2015-12-06: qty 4

## 2015-12-06 MED ORDER — HEPARIN SOD (PORK) LOCK FLUSH 100 UNIT/ML IV SOLN
500.0000 [IU] | Freq: Once | INTRAVENOUS | Status: AC
  Administered 2015-12-06: 500 [IU] via INTRAVENOUS
  Filled 2015-12-06: qty 5

## 2015-12-06 NOTE — Patient Instructions (Signed)
Rehydration, Adult Rehydration is the replacement of body fluids and salts and minerals (electrolytes) that are lost during dehydration. Dehydration is when there is not enough fluid or water in the body. This happens when you lose more fluids than you take in. Common causes of dehydration include:  Vomiting.  Diarrhea.  Excessive sweating, such as from heat exposure or exercise.  Taking medicines that cause the body to lose excess fluid (diuretics).  Impaired kidney function.  Not drinking enough fluid.  Certain illnesses or infections.  Certain poorly controlled long-term (chronic) illnesses, such as diabetes, heart disease, and kidney disease. Symptoms of mild dehydration may include thirst, dry lips and mouth, dry skin, and dizziness. Symptoms of severe dehydration may include increased heart rate, confusion, fainting, and not urinating. You can rehydrate by drinking certain fluids or getting fluids through an IV tube, as told by your health care provider. What are the risks? Generally, rehydration is safe. However, one problem that can happen is taking in too much fluid (overhydration). This is rare. If overhydration happens, it can cause an electrolyte imbalance, kidney failure, or a decrease in salt (sodium) levels in the body. How to rehydrate Follow instructions from your health care provider for rehydration. The kind of fluid you should drink and the amount you should drink depend on your condition.  If directed by your health care provider, drink an oral rehydration solution (ORS). This is a drink designed to treat dehydration that is found in pharmacies and retail stores.  Make an ORS by following instructions on the package.  Start by drinking small amounts, about  cup (120 mL) every 5-10 minutes.  Slowly increase how much you drink until you have taken the amount recommended by your health care provider.  Drink enough clear fluids to keep your urine clear or pale  yellow. If you were instructed to drink an ORS, finish the ORS first, then start slowly drinking other clear fluids. Drink fluids such as:  Water. Do not drink only water. Doing that can lead to having too little sodium in your body (hyponatremia).  Ice chips.  Fruit juice that you have added water to (diluted juice).  Low-calorie sports drinks.  If you are severely dehydrated, your health care provider may recommend that you receive fluids through an IV tube in the hospital.  Do not take sodium tablets. Doing that can lead to the condition of having too much sodium in your body (hypernatremia). Eating while you rehydrate Follow instructions from your health care provider about what to eat while you rehydrate. Your health care provider may recommend that you slowly begin eating regular foods in small amounts.  Eat foods that contain a healthy balance of electrolytes, such as bananas, oranges, potatoes, tomatoes, and spinach.  Avoid foods that are greasy or contain a lot of fat or sugar. In some cases, you may get nutrition through a feeding tube that is passed through your nose and into your stomach (nasogastric tube, or NG tube). This may be done if you have uncontrolled vomiting or diarrhea. Beverages to avoid Certain beverages may make dehydration worse. While you rehydrate, avoid:  Alcohol.  Caffeine.  Drinks that contain a lot of sugar. These include:  High-calorie sports drinks.  Fruit juice that is not diluted.  Soda. Check nutrition labels to see how much sugar or caffeine a beverage contains. Signs of dehydration recovery You may be recovering from dehydration if:  You are urinating more often than before you started rehydrating.  Your   urine is clear or pale yellow.  Your energy level improves.  You vomit less frequently.  You have diarrhea less frequently.  Your appetite improves or returns to normal.  You feel less dizzy or less light-headed.  Your skin  tone and color start to look more normal. Contact a health care provider if:  You continue to have symptoms of mild dehydration, such as:  Thirst.  Dry lips.  Slightly dry mouth.  Dry, warm skin.  Dizziness.  You continue to vomit or have diarrhea. Get help right away if:  You have symptoms of dehydration that get worse.  You feel:  Confused.  Weak.  Like you are going to faint.  You have not urinated in 6-8 hours.  You have very dark urine.  You have trouble breathing.  Your heart rate while sitting still is over 100 beats a minute.  You cannot drink fluids without vomiting.  You have vomiting or diarrhea that:  Gets worse.  Does not go away.  You have a fever. This information is not intended to replace advice given to you by your health care provider. Make sure you discuss any questions you have with your health care provider. Document Released: 03/15/2011 Document Revised: 07/11/2015 Document Reviewed: 02/14/2015 Elsevier Interactive Patient Education  2017 [employer] Inc.  

## 2015-12-08 ENCOUNTER — Other Ambulatory Visit: Payer: Self-pay | Admitting: *Deleted

## 2015-12-08 ENCOUNTER — Encounter: Payer: Self-pay | Admitting: Radiation Oncology

## 2015-12-08 MED ORDER — MAGIC MOUTHWASH W/LIDOCAINE
5.0000 mL | Freq: Four times a day (QID) | ORAL | 3 refills | Status: DC
Start: 2015-12-08 — End: 2016-01-07

## 2015-12-08 NOTE — Progress Notes (Signed)
Paperwork [facility]) received for patient, will send to [facility], patient only seen as consult not being treated at this time

## 2015-12-08 NOTE — Telephone Encounter (Signed)
Pt called with c/o discomfort when swallowing. Relate drinking fluids "just not as much because it hurts when I swallow". Pt denies need to come to [facility] or IVF. Called pt in magic mouth wash per Dr. Lindi Adie order. Gave pt instructions.  Discussed with pt need to stay hydrated and to call if she needs IVF. Received verbal understanding. Denies further needs at this time.

## 2015-12-09 ENCOUNTER — Encounter: Payer: Self-pay | Admitting: Hematology and Oncology

## 2015-12-09 ENCOUNTER — Telehealth: Payer: Self-pay | Admitting: Gynecologic Oncology

## 2015-12-09 ENCOUNTER — Ambulatory Visit: Payer: 59 | Attending: Plastic Surgery | Admitting: Physical Therapy

## 2015-12-09 DIAGNOSIS — M25611 Stiffness of right shoulder, not elsewhere classified: Secondary | ICD-10-CM | POA: Insufficient documentation

## 2015-12-09 DIAGNOSIS — R600 Localized edema: Secondary | ICD-10-CM | POA: Insufficient documentation

## 2015-12-09 DIAGNOSIS — M25612 Stiffness of left shoulder, not elsewhere classified: Secondary | ICD-10-CM | POA: Insufficient documentation

## 2015-12-09 NOTE — Patient Instructions (Addendum)
Do the following once a day. Use red Theraband.  Do 10 repetitions. Later, increase to 15 and then 20 repetitions, and then later, when that gets easy, change to green Theraband and start over with 10 reps.  Over Head Pull: Narrow Grip     [id number]   On back, knees bent, feet flat, band across thighs, elbows straight but relaxed. Pull hands apart (start). Keeping elbows straight, bring arms up and over head, hands toward floor. Keep pull steady on band. Hold momentarily. Return slowly, keeping pull steady, back to start. Repeat ___ times. Band color ______   Do the same as above but with a wide trip on the [place].   Side Pull: Double Arm   On back, knees bent, feet flat. Arms perpendicular to body, shoulder level, elbows straight but relaxed. Pull arms out to sides, elbows straight. Resistance band comes across collarbones, hands toward floor. Hold momentarily. Slowly return to starting position. Repeat ___ times. Band color _____   Sash   On back, knees bent, feet flat, left hand on left hip, right hand above left. Pull right arm DIAGONALLY (hip to shoulder) across chest. Bring right arm along head toward floor. Hold momentarily. Slowly return to starting position. Repeat ___ times. Do with left arm. Band color ______   Shoulder Rotation: Double Arm   On back, knees bent, feet flat, elbows tucked at sides, bent 90, hands palms up. Pull hands apart and down toward floor, keeping elbows near sides. Hold momentarily. Slowly return to starting position. Repeat ___ times. Band color ______

## 2015-12-09 NOTE — Telephone Encounter (Signed)
Appt rescheduled from 12/11 to 12/27 w/Rossi per pt's request due to driving distance. Pt agreed.

## 2015-12-09 NOTE — Progress Notes (Signed)
Pt is approved for the $1000 [employer] grant.  

## 2015-12-09 NOTE — Therapy (Signed)
[place], [place], [address] Phone: [phone]   Fax:  [phone]  Physical Therapy Treatment  Patient Details  Name: Melanie Barber MRN: [id number] Date of Birth: [birth year] Referring Provider: Thiammappa   Encounter Date: 12/09/2015      PT End of Session - 12/09/15 1439    Visit Number 6   Number of Visits 13   Date for PT Re-Evaluation 01/01/16   PT Start Time 1351   PT Stop Time 1434   PT Time Calculation (min) 43 min   Activity Tolerance Patient tolerated treatment well   Behavior During Therapy WFL for tasks assessed/performed      Past Medical History:  Diagnosis Date  . Arthritis    right knee (10/01/2015)  . Cancer of right breast ([place])   . Migraine    "once when I was going thru a divorce; called them cluster" (10/01/2015)  . PONV (postoperative nausea and vomiting)   . Sciatic leg pain    left leg    Past Surgical History:  Procedure Laterality Date  . BREAST BIOPSY Right   . BREAST CYST ASPIRATION Right   . BREAST RECONSTRUCTION WITH PLACEMENT OF TISSUE EXPANDER AND FLEX HD (ACELLULAR HYDRATED DERMIS) Bilateral 10/01/2015  . BREAST RECONSTRUCTION WITH PLACEMENT OF TISSUE EXPANDER AND FLEX HD (ACELLULAR HYDRATED DERMIS) Bilateral 10/01/2015   Procedure: BREAST RECONSTRUCTION WITH PLACEMENT OF TISSUE EXPANDER AND CORTIVA;  Surgeon: Irene Limbo, MD;  Location: [facility];  Service: Plastics;  Laterality: Bilateral;  . [place]   growth removed  . MASTECTOMY Left 10/01/2015   NIPPLE SPARING  . MASTECTOMY COMPLETE / SIMPLE W/ SENTINEL NODE BIOPSY Right 10/01/2015   NIPPLE SPARING  . NASAL/SINUS ENDOSCOPY Bilateral 2005  . NIPPLE SPARING MASTECTOMY/SENTINAL LYMPH NODE BIOPSY/RECONSTRUCTION/PLACEMENT OF TISSUE EXPANDER Bilateral 10/01/2015   Procedure: BILATERAL NIPPLE SPARING MASTECTOMY WITH RIGHT SENTINAL LYMPH NODE BIOPSY;  Surgeon: Rolm Bookbinder, MD;   Location: [facility];  Service: General;  Laterality: Bilateral;  . PORTACATH PLACEMENT Right 10/01/2015  . PORTACATH PLACEMENT Right 10/01/2015   Procedure: INSERTION PORT-A-CATH;  Surgeon: Rolm Bookbinder, MD;  Location: [facility];  Service: General;  Laterality: Right;  . TOE SURGERY Left 1990   "bone spur on great toe"  . TONSILLECTOMY  1977  . ULTRASOUND GUIDANCE FOR VASCULAR ACCESS Right 10/01/2015   Procedure: ULTRASOUND GUIDANCE;  Surgeon: Rolm Bookbinder, MD;  Location: [facility];  Service: General;  Laterality: Right;  . WISDOM TOOTH EXTRACTION      There were no vitals filed for this visit.      Subjective Assessment - 12/09/15 1353    Subjective "I'm not going to lie; I haven't done anything this week.  This last chemo was pretty bad." Made herself take a walk yesterday, though she didn't feel like it, 303.5 miles.  Then she was spent. Has been doing Christmas decorating, and that is exercise.            [facility] PT Assessment - 12/09/15 0001      AROM   Right Shoulder ABduction 162 Degrees  prior to stretching   Right Shoulder Internal Rotation 83 Degrees                     [facility] Adult PT Treatment/Exercise - 12/09/15 0001      Elbow Exercises   Elbow Flexion Strengthening;Both;Standing;Bar weights/barbell;20 reps   Bar Weights/Barbell (Elbow Flexion) 4 lbs   Elbow Extension Strengthening;Both;Standing;Bar weights/barbell;10 reps  +  10 reps at 2 lbs. each hand   Bar Weights/Barbell (Elbow Extension) 4 lbs     Lumbar Exercises: Supine   Bridge 10 reps   Other Supine Lumbar Exercises ab set with one foot up, then the other to 90/90 positions, 10 reps     Knee/Hip Exercises: Seated   Sit to Sand 10 reps  squat, not quite sitting in between     Shoulder Exercises: Supine   Other Supine Exercises supine scapular series with red Theraband x 10 each, full program as per patient instructions   Other Supine Exercises chest press with 4 lbs. each hand x 10 x 2      Shoulder Exercises: Standing   Other Standing Exercises scaption, 2 lbs. each hand x 10x 2                PT Education - 12/09/15 1414    Education provided Yes   Education Details supine scapular series with red Theraband   Person(s) Educated Patient   Methods Explanation;Verbal cues;Handout   Comprehension Verbalized understanding;Returned demonstration                [facility] Goals - 12/09/15 1357      CC Long Term Goal  #1   Title Patient to independently verbalize lymphedema risk reduction practices   Status Achieved     CC Long Term Goal  #2   Title Patient to be independent in a home exercise program for strengthening and stretching   Status Partially Met     CC Long Term Goal  #3   Title Patient to demonstrate 170 degrees of right shoulder flexion to allow her to reach items overhead   Status On-going     CC Long Term Goal  #4   Title Patient to demonstrate 165 degrees of right shoulder abduction to allow her to reach items out to sides   Baseline 100 at eval, 163 degees on 11/18/2015, 165 on 12/02/2015; 162 on 12/09/15     CC Long Term Goal  #5   Title Patient to demonstrate 65 degrees of right shoulder internal rotation to allow her to return to prior level of function   Baseline 83 on 12/09/15 in supine   Status Achieved            Plan - 12/09/15 1439    Clinical Impression Statement Patient did well with exercises today, including adding new supine scapular stabilization with red Theraband.  She had had a rough week prior to today's appointment because of chemo, so hadn't been able to exercise much at home other than some walking.  She has met her shoulder internal rotation goal.   Rehab Potential Good   Clinical Impairments Affecting Rehab Potential in chemo   PT Frequency 1x / week   PT Duration 4 weeks   PT Treatment/Interventions Passive range of motion;Taping;ADLs/Self Care Home Management;Therapeutic exercise;Patient/family  education;Manual lymph drainage;Manual techniques;Orthotic Fit/Training   PT Next Visit Plan review progress on doing home exercise strength ABC and supine scapular series; progress HEP as appropriate   PT Home Exercise Plan breast cancer post op exercises, walking, strength ABC program, supine scapular series   Consulted and Agree with Plan of Care Patient      Patient will benefit from skilled therapeutic intervention in order to improve the following deficits and impairments:  Increased edema, Decreased knowledge of precautions, Decreased range of motion, Decreased strength, Impaired UE functional use, Pain  Visit Diagnosis: Stiffness of right  shoulder, not elsewhere classified  Stiffness of left shoulder, not elsewhere classified     Problem List Patient Active Problem List   Diagnosis Date Noted  . Genetic testing 11/17/2015  . Malignant neoplasm of upper-outer quadrant of right breast in female, estrogen receptor positive ([place]) 10/09/2015  . Breast cancer of upper-outer quadrant of right female breast ([place]) 09/12/2015    Xiao Graul 12/09/2015, 2:44 PM  [place], [place], [address] Phone: [phone]   Fax:  [phone]  Name: Melanie Barber MRN: [id number] Date of Birth: Oct 14, 1965   Serafina Royals, PT 12/09/15 2:45 PM

## 2015-12-15 ENCOUNTER — Encounter: Payer: Self-pay | Admitting: Gynecologic Oncology

## 2015-12-15 ENCOUNTER — Ambulatory Visit: Payer: 59 | Admitting: Gynecologic Oncology

## 2015-12-16 ENCOUNTER — Ambulatory Visit: Payer: 59 | Admitting: Physical Therapy

## 2015-12-16 DIAGNOSIS — M25612 Stiffness of left shoulder, not elsewhere classified: Secondary | ICD-10-CM

## 2015-12-16 DIAGNOSIS — R600 Localized edema: Secondary | ICD-10-CM

## 2015-12-16 DIAGNOSIS — M25611 Stiffness of right shoulder, not elsewhere classified: Secondary | ICD-10-CM

## 2015-12-16 MED FILL — ONDANSETRON HCL 8 MG TABLET: 8 | 15 days supply | Qty: 30 | Fill #1

## 2015-12-16 MED FILL — PROCHLORPERAZINE 10 MG TAB: 10 | 8 days supply | Qty: 30 | Fill #1

## 2015-12-16 NOTE — Assessment & Plan Note (Signed)
Bilateral mastectomies 10/01/2015 With reconstruction Right mastectomy: IDC grade 2, multifocal largest 2.5 cm, with high-grade DCIS, broadly present at anterior margin and inferior margin 2/5 LN positive, LVI Present,  Left mastectomy: ALH, ER 95%, PR 5-65%, HER-2 negative, Ki-67 10-20%,  Pathologic stage: T2 N1 a stage IIB Mammaprint: High risk CT-CAP and bone scan: No evidence of metastatic disease ----------------------------------------------------------------------------------------------------------------------------------------------------------------- Treatment plan: 1. Because of lymph node involvement I discussed the risks and benefits of doing axillary lymph node dissection. With the advent of chemotherapy and adjuvant radiation, axillary lymph node dissection has not shown to have substantial benefit. 2. Adjuvant chemotherapy with dose dense Adriamycin and Cytoxan 4 followed by Abraxane weekly 12 ( patient is intolerant to steroids and hence we cannot use paclitaxel) 3. Followed by adjuvant radiation therapy 4. Followed by adjuvant antiestrogen therapy with tamoxifen 10 years  PREVENT trial: [id number]  ------------------------------------------------------------------------------------------------------------------------------- Current treatment: Cycle 4 day 1 dose dense Adriamycin Cytoxan Chemo Toxicities: 1. Intermittent nausea for which she takes Zofran and Compazine around-the-clock 2. constipation: Take stool softeners 3. Alopecia 4. Mouth sore that was treated with acyclovir: Resolved  RTC in 2 weeks for cycle 1 Abraxane

## 2015-12-16 NOTE — Therapy (Signed)
[place], [place], [address] Phone: [phone]   Fax:  [phone]  Physical Therapy Treatment  Patient Details  Name: Melanie Barber MRN: [id number] Date of Birth: May 04, 1965 Referring Provider: Thiammappa   Encounter Date: 12/16/2015      PT End of Session - 12/16/15 1640    Visit Number 7   Number of Visits 13   Date for PT Re-Evaluation 01/01/16   PT Start Time 9485   PT Stop Time 1430   PT Time Calculation (min) 45 min   Activity Tolerance Patient tolerated treatment well   Behavior During Therapy WFL for tasks assessed/performed      Past Medical History:  Diagnosis Date  . Arthritis    right knee (10/01/2015)  . Cancer of right breast ([place])   . Migraine    "once when I was going thru a divorce; called them cluster" (10/01/2015)  . PONV (postoperative nausea and vomiting)   . Sciatic leg pain    left leg    Past Surgical History:  Procedure Laterality Date  . BREAST BIOPSY Right   . BREAST CYST ASPIRATION Right   . BREAST RECONSTRUCTION WITH PLACEMENT OF TISSUE EXPANDER AND FLEX HD (ACELLULAR HYDRATED DERMIS) Bilateral 10/01/2015  . BREAST RECONSTRUCTION WITH PLACEMENT OF TISSUE EXPANDER AND FLEX HD (ACELLULAR HYDRATED DERMIS) Bilateral 10/01/2015   Procedure: BREAST RECONSTRUCTION WITH PLACEMENT OF TISSUE EXPANDER AND CORTIVA;  Surgeon: Irene Limbo, MD;  Location: [facility];  Service: Plastics;  Laterality: Bilateral;  . [place]   growth removed  . MASTECTOMY Left 10/01/2015   NIPPLE SPARING  . MASTECTOMY COMPLETE / SIMPLE W/ SENTINEL NODE BIOPSY Right 10/01/2015   NIPPLE SPARING  . NASAL/SINUS ENDOSCOPY Bilateral 2005  . NIPPLE SPARING MASTECTOMY/SENTINAL LYMPH NODE BIOPSY/RECONSTRUCTION/PLACEMENT OF TISSUE EXPANDER Bilateral 10/01/2015   Procedure: BILATERAL NIPPLE SPARING MASTECTOMY WITH RIGHT SENTINAL LYMPH NODE BIOPSY;  Surgeon: Rolm Bookbinder, MD;   Location: [facility];  Service: General;  Laterality: Bilateral;  . PORTACATH PLACEMENT Right 10/01/2015  . PORTACATH PLACEMENT Right 10/01/2015   Procedure: INSERTION PORT-A-CATH;  Surgeon: Rolm Bookbinder, MD;  Location: [facility];  Service: General;  Laterality: Right;  . TOE SURGERY Left 1990   "bone spur on great toe"  . TONSILLECTOMY  1977  . ULTRASOUND GUIDANCE FOR VASCULAR ACCESS Right 10/01/2015   Procedure: ULTRASOUND GUIDANCE;  Surgeon: Rolm Bookbinder, MD;  Location: [facility];  Service: General;  Laterality: Right;  . WISDOM TOOTH EXTRACTION      There were no vitals filed for this visit.      Subjective Assessment - 12/16/15 1353    Subjective Pt is dreading the treatment tomorrow because of how bad it was last time.  She has done her strengthening exercise 2x this past week. She walked 5 miles yesterday.  This will be her fourth treatment and then she goes to her next medicine    Pertinent History Right mastectomy: IDC grade 2, multifocal largest 2.5 cm, with high-grade DCIS, broadly present at  anterior margin and inferior margin 2/5 LN positive, LVI Present,  Left mastectomy: ALH, ER 95%, PR 5-65%, HER-2 negative, Ki-67 10-20%, T2 N1 a stage IIB, patient to begin chemotherapy 11/05/15 then radiation once chemo is completed in approx 5 months   Patient Stated Goals improve ROM            [facility] PT Assessment - 12/16/15 0001      Balance   Balance Assessed --  hold for 10 sec, rhomberg,semi, tandem, one-legged,      Standardized Balance Assessment   Standardized Balance Assessment Timed Up and Go Test     Timed Up and Go Test   Normal TUG (seconds) 6.75                     [facility] Adult PT Treatment/Exercise - 12/16/15 0001      Lumbar Exercises: Supine   Other Supine Lumbar Exercises legs in 90/90 postion with alternating legs out and in with core engaged      Shoulder Exercises: Standing   Horizontal ABduction Strengthening;Both;10 reps;Theraband    Theraband Level (Shoulder Horizontal ABduction) Level 2 (Red)   External Rotation Strengthening;Both;10 reps;Theraband   Theraband Level (Shoulder External Rotation) Level 2 (Red)   Flexion Strengthening;Both;10 reps;Theraband   Theraband Level (Shoulder Flexion) Level 2 (Red)   Extension Strengthening;Both;5 reps;Theraband   Theraband Level (Shoulder Extension) Level 2 (Red)   Other Standing Exercises diagonal elevation with red theraband, 10 reps                         Long Term Clinic Goals - 12/09/15 1357      CC Long Term Goal  #1   Title Patient to independently verbalize lymphedema risk reduction practices   Status Achieved     CC Long Term Goal  #2   Title Patient to be independent in a home exercise program for strengthening and stretching   Status Partially Met     CC Long Term Goal  #3   Title Patient to demonstrate 170 degrees of right shoulder flexion to allow her to reach items overhead   Status On-going     CC Long Term Goal  #4   Title Patient to demonstrate 165 degrees of right shoulder abduction to allow her to reach items out to sides   Baseline 100 at eval, 163 degees on 11/18/2015, 165 on 12/02/2015; 162 on 12/09/15     CC Long Term Goal  #5   Title Patient to demonstrate 65 degrees of right shoulder internal rotation to allow her to return to prior level of function   Baseline 83 on 12/09/15 in supine   Status Achieved            Plan - 12/16/15 1640    Clinical Impression Statement upgraded pt exercise program to do scapular exercise in standing with attention to activate core and maintain scapular depression.  She anticipates that the next few weeks will be difficult due to chemo.  Would like to check back in 4 weeks to reassess and determine plan going forward    Rehab Potential Good   Clinical Impairments Affecting Rehab Potential in chemo   PT Next Visit Plan reassess and send for recertification    PT Home Exercise Plan breast  cancer post op exercises, walking, strength ABC program, supine scapular series   Consulted and Agree with Plan of Care Patient      Patient will benefit from skilled therapeutic intervention in order to improve the following deficits and impairments:  Increased edema, Decreased knowledge of precautions, Decreased range of motion, Decreased strength, Impaired UE functional use, Pain  Visit Diagnosis: Stiffness of right shoulder, not elsewhere classified  Stiffness of left shoulder, not elsewhere classified  Localized edema     Problem List Patient Active Problem List   Diagnosis Date Noted  . Genetic testing 11/17/2015  . Malignant neoplasm of upper-outer  quadrant of right breast in female, estrogen receptor positive ([place]) 10/09/2015  . Breast cancer of upper-outer quadrant of right female breast ([place]) 09/12/2015   Donato Heinz. Owens Shark PT  Norwood Levo 12/16/2015, 4:45 PM  [place], [place], [address] Phone: [phone]   Fax:  [phone]  Name: Melanie Barber MRN: [id number] Date of Birth: 11-28-1965

## 2015-12-16 NOTE — Patient Instructions (Signed)
Do band exercises in standing up to 20 reps.  Keep abdomen engaged.  Tie band around doorknob and close door.  Stand straight with shoulders back and down and pull back with band in hand to activate back of arm and core Lie on back with legs in tabletop.  Reach out with one leg at a time. Do 10-15 times with each leg

## 2015-12-17 ENCOUNTER — Other Ambulatory Visit (HOSPITAL_BASED_OUTPATIENT_CLINIC_OR_DEPARTMENT_OTHER): Payer: 59

## 2015-12-17 ENCOUNTER — Encounter: Payer: Self-pay | Admitting: Hematology and Oncology

## 2015-12-17 ENCOUNTER — Ambulatory Visit (HOSPITAL_BASED_OUTPATIENT_CLINIC_OR_DEPARTMENT_OTHER): Payer: 59 | Admitting: Hematology and Oncology

## 2015-12-17 ENCOUNTER — Ambulatory Visit (HOSPITAL_BASED_OUTPATIENT_CLINIC_OR_DEPARTMENT_OTHER): Payer: 59

## 2015-12-17 ENCOUNTER — Encounter: Payer: Self-pay | Admitting: *Deleted

## 2015-12-17 DIAGNOSIS — Z5111 Encounter for antineoplastic chemotherapy: Secondary | ICD-10-CM | POA: Diagnosis not present

## 2015-12-17 DIAGNOSIS — Z170 Estrogen receptor positive status [ER+]: Secondary | ICD-10-CM | POA: Diagnosis not present

## 2015-12-17 DIAGNOSIS — Z006 Encounter for examination for normal comparison and control in clinical research program: Secondary | ICD-10-CM

## 2015-12-17 DIAGNOSIS — C50411 Malignant neoplasm of upper-outer quadrant of right female breast: Secondary | ICD-10-CM | POA: Diagnosis not present

## 2015-12-17 LAB — CBC WITH DIFFERENTIAL/PLATELET
BASO%: 1 % (ref 0.0–2.0)
BASOS ABS: 0.1 10*3/uL (ref 0.0–0.1)
EOS%: 0.5 % (ref 0.0–7.0)
Eosinophils Absolute: 0 10*3/uL (ref 0.0–0.5)
HCT: 28.7 % — ABNORMAL LOW (ref 34.8–46.6)
HEMOGLOBIN: 9.7 g/dL — AB (ref 11.6–15.9)
LYMPH%: 11 % — ABNORMAL LOW (ref 14.0–49.7)
MCH: 30.4 pg (ref 25.1–34.0)
MCHC: 33.8 g/dL (ref 31.5–36.0)
MCV: 90 fL (ref 79.5–101.0)
MONO#: 0.6 10*3/uL (ref 0.1–0.9)
MONO%: 9.8 % (ref 0.0–14.0)
NEUT%: 77.7 % — ABNORMAL HIGH (ref 38.4–76.8)
NEUTROS ABS: 4.8 10*3/uL (ref 1.5–6.5)
Platelets: 160 10*3/uL (ref 145–400)
RBC: 3.19 10*6/uL — AB (ref 3.70–5.45)
RDW: 13.5 % (ref 11.2–14.5)
WBC: 6.2 10*3/uL (ref 3.9–10.3)
lymph#: 0.7 10*3/uL — ABNORMAL LOW (ref 0.9–3.3)

## 2015-12-17 LAB — COMPREHENSIVE METABOLIC PANEL
ALBUMIN: 3.9 g/dL (ref 3.5–5.0)
ALK PHOS: 91 U/L (ref 40–150)
ALT: 13 U/L (ref 0–55)
AST: 16 U/L (ref 5–34)
Anion Gap: 8 mEq/L (ref 3–11)
BUN: 8.8 mg/dL (ref 7.0–26.0)
CO2: 24 meq/L (ref 22–29)
Calcium: 9 mg/dL (ref 8.4–10.4)
Chloride: 108 mEq/L (ref 98–109)
Creatinine: 0.7 mg/dL (ref 0.6–1.1)
EGFR: >90 mL/min/{1.73_m2} (ref >90)
GLUCOSE: 94 mg/dL (ref 70–140)
POTASSIUM: 3.8 meq/L (ref 3.5–5.1)
SODIUM: 141 meq/L (ref 136–145)
Total Bilirubin: 0.39 mg/dL (ref 0.20–1.20)
Total Protein: 6.7 g/dL (ref 6.4–8.3)

## 2015-12-17 MED ORDER — PALONOSETRON HCL INJECTION 0.25 MG/5ML
0.2500 mg | Freq: Once | INTRAVENOUS | Status: AC
  Administered 2015-12-17: 0.25 mg via INTRAVENOUS

## 2015-12-17 MED ORDER — HEPARIN SOD (PORK) LOCK FLUSH 100 UNIT/ML IV SOLN
500.0000 [IU] | Freq: Once | INTRAVENOUS | Status: AC | PRN
  Administered 2015-12-17: 500 [IU]
  Filled 2015-12-17: qty 5

## 2015-12-17 MED ORDER — PROMETHAZINE HCL 25 MG PO TABS
25.0000 mg | ORAL_TABLET | Freq: Four times a day (QID) | ORAL | 3 refills | Status: DC | PRN
Start: 2015-12-17 — End: 2016-03-29

## 2015-12-17 MED ORDER — DOXORUBICIN HCL CHEMO IV INJECTION 2 MG/ML
50.0000 mg/m2 | Freq: Once | INTRAVENOUS | Status: AC
  Administered 2015-12-17: 100 mg via INTRAVENOUS
  Filled 2015-12-17: qty 50

## 2015-12-17 MED ORDER — SODIUM CHLORIDE 0.9 % IV SOLN
Freq: Once | INTRAVENOUS | Status: AC
  Administered 2015-12-17: 10:00:00 via INTRAVENOUS

## 2015-12-17 MED ORDER — PEGFILGRASTIM 6 MG/0.6ML ~~LOC~~ PSKT
6.0000 mg | PREFILLED_SYRINGE | Freq: Once | SUBCUTANEOUS | Status: AC
  Administered 2015-12-17: 6 mg via SUBCUTANEOUS
  Filled 2015-12-17: qty 0.6

## 2015-12-17 MED ORDER — SODIUM CHLORIDE 0.9 % IV SOLN
Freq: Once | INTRAVENOUS | Status: AC
  Administered 2015-12-17: 10:00:00 via INTRAVENOUS
  Filled 2015-12-17: qty 5

## 2015-12-17 MED ORDER — SODIUM CHLORIDE 0.9 % IV SOLN
500.0000 mg/m2 | Freq: Once | INTRAVENOUS | Status: AC
  Administered 2015-12-17: 1000 mg via INTRAVENOUS
  Filled 2015-12-17: qty 50

## 2015-12-17 MED ORDER — PALONOSETRON HCL INJECTION 0.25 MG/5ML
INTRAVENOUS | Status: AC
  Filled 2015-12-17: qty 5

## 2015-12-17 MED ORDER — SODIUM CHLORIDE 0.9% FLUSH
10.0000 mL | INTRAVENOUS | Status: DC | PRN
  Administered 2015-12-17: 10 mL
  Filled 2015-12-17: qty 10

## 2015-12-17 MED FILL — PROMETHAZINE 25 MG TABLET: 25 | 7 days supply | Qty: 30 | Fill #0

## 2015-12-17 NOTE — Progress Notes (Signed)
Patient Care Team: No Pcp Per Patient as PCP - General (General Practice)  DIAGNOSIS:  Encounter Diagnosis  Name Primary?  . Malignant neoplasm of upper-outer quadrant of right breast in female, estrogen receptor positive (Melanie Barber)     SUMMARY OF ONCOLOGIC HISTORY:   Breast cancer of upper-outer quadrant of right female breast (Melanie Barber)   09/02/2015 Mammogram    Right breast UOQ: 2 masses 3 cm apart, U/S they measured 3.4 cm, larger mass at 1.6 cm, in addition, 1 cm mass at 9:00 and 0.9 cm mass at 8:30( could be intramammary LN); right axillary LN 11 mm; microcalcs 10 cm span       09/09/2015 Initial Diagnosis    Right breast biopsy OUQ: DCIS with calcs and necrosis ER 95%, PR 80%; 9:30 position 5cmfn: IDC with DCIS ER 95%, PR 65%, Ki-67 20%,; right biopsy 8:30 position 3cmfn ER 95%, PR 5%, Ki-67 10%: intramammary LN with IDC      09/12/2015 Breast MRI    Multicentric right breast cancer cluster of enhancing masses 4.1 x 2.2 x 2.6 cm, LOQ: 1.1 cm enhancing mass previously biopsied, subcutaneous low right breast 2 cm mass not biopsied, multiple other small enhancing masses, 1 cm right axillary lymph node      10/01/2015 Surgery    Right mastectomy: IDC grade 2, multifocal largest 2.5 cm, with high-grade DCIS, broadly present at  anterior margin and inferior margin 2/5 LN positive, LVI Present,  Left mastectomy: ALH, ER 95%, PR 5-65%, HER-2 negative, Ki-67 10-20%, T2 N1 a stage IIB      11/05/2015 -  Chemotherapy    Adjuvant chemotherapy with dose dense Adriamycin and Cytoxan 4 followed by Abraxane weekly 12       CHIEF COMPLIANT: Cycle 4 of dose dense Adriamycin and Cytoxan  INTERVAL HISTORY: Melanie Barber is a 32 year with above-mentioned history of right breast cancer treated with mastectomy and is currently on adjuvant chemotherapy. After the last cycle she had 7 days of profound fatigue, severe nausea, mouth sores. Distal the past 3-4 days she has started to feel better. She denied  any fevers or chills. She required IV fluids last Saturday. She was not eating or drinking much.  REVIEW OF SYSTEMS:   Constitutional: Denies fevers, chills or abnormal weight loss Eyes: Denies blurriness of vision Ears, nose, mouth, throat, and face: Mouth sores Respiratory: Denies cough, dyspnea or wheezes Cardiovascular: Denies palpitation, chest discomfort Gastrointestinal:  Constipation Skin: Denies abnormal skin rashes Lymphatics: Denies new lymphadenopathy or easy bruising Neurological:Denies numbness, tingling or new weaknesses Behavioral/Psych: Mood is stable, no new changes  Extremities: No lower extremity edema All other systems were reviewed with the patient and are negative.  I have reviewed the past medical history, past surgical history, social history and family history with the patient and they are unchanged from previous note.  ALLERGIES:  is allergic to keflex [cephalexin] and decadron [dexamethasone].  MEDICATIONS:  Current Outpatient Prescriptions  Medication Sig Dispense Refill  . Atorvastatin Calcium (INVESTIGATIONAL ATORVASTATIN/PLACEBO) 40 MG tablet [facility] [address] Take 1 tablet by mouth daily. Take 2 doses (these doses must be 12 hours apart) prior to first chemotherapy treatment. Then take 1 tablet daily by mouth with or without food. 180 tablet 0  . ibuprofen (ADVIL,MOTRIN) 200 MG tablet Take 400 mg by mouth every 6 (six) hours as needed for mild pain.    Marland Kitchen levonorgestrel (MIRENA) 20 MCG/24HR IUD 1 each by Intrauterine route once.    . lidocaine-prilocaine (  EMLA) cream Apply to affected area once 30 g 3  . LORazepam (ATIVAN) 0.5 MG tablet Take 1 tablet (0.5 mg total) by mouth every 6 (six) hours as needed (Nausea or vomiting). (Patient not taking: Reported on 11/04/2015) 30 tablet 0  . magic mouthwash w/lidocaine SOLN Take 5 mLs by mouth 4 (four) times daily. 240 mL 3  . Multiple Vitamin (MULTIVITAMIN WITH MINERALS) TABS tablet Take 1 tablet by mouth  daily.    . ondansetron (ZOFRAN) 8 MG tablet Take 1 tablet (8 mg total) by mouth 2 (two) times daily as needed. Start on the third day after chemotherapy. 30 tablet 1  . prochlorperazine (COMPAZINE) 10 MG tablet Take 1 tablet (10 mg total) by mouth every 6 (six) hours as needed (Nausea or vomiting). 30 tablet 1  . promethazine (PHENERGAN) 25 MG tablet Take 1 tablet (25 mg total) by mouth every 6 (six) hours as needed for nausea. 30 tablet 3  . valACYclovir (VALTREX) 1000 MG tablet Take 1 tablet (1,000 mg total) by mouth daily. 30 tablet 3   No current facility-administered medications for this visit.    Facility-Administered Medications Ordered in Other Visits  Medication Dose Route Frequency Provider Last Rate Last Dose  . heparin lock flush 100 unit/mL  500 Units Intracatheter Once PRN Nicholas Lose, MD      . pegfilgrastim (NEULASTA ONPRO KIT) injection 6 mg  6 mg Subcutaneous Once Nicholas Lose, MD      . sodium chloride flush (NS) 0.9 % injection 10 mL  10 mL Intracatheter PRN Nicholas Lose, MD        PHYSICAL EXAMINATION: ECOG PERFORMANCE STATUS: 1 - Symptomatic but completely ambulatory  Vitals:   12/17/15 0835  BP: 114/74  Pulse: 92  Resp: 19  Temp: 97.6 F (36.4 C)   Filed Weights   12/17/15 0835  Weight: 189 lb 14.4 oz (86.1 kg)    GENERAL:alert, no distress and comfortable SKIN: skin color, texture, turgor are normal, no rashes or significant lesions EYES: normal, Conjunctiva are pink and non-injected, sclera clear OROPHARYNX:no exudate, no erythema and lips, buccal mucosa, and tongue normal  NECK: supple, thyroid normal size, non-tender, without nodularity LYMPH:  no palpable lymphadenopathy in the cervical, axillary or inguinal LUNGS: clear to auscultation and percussion with normal breathing effort HEART: regular rate & rhythm and no murmurs and no lower extremity edema ABDOMEN:abdomen soft, non-tender and normal bowel sounds MUSCULOSKELETAL:no cyanosis of digits  and no clubbing  NEURO: alert & oriented x 3 with fluent speech, no focal motor/sensory deficits EXTREMITIES: No lower extremity edema  LABORATORY DATA:  I have reviewed the data as listed   Chemistry      Component Value Date/Time   NA 141 12/17/2015 0822   K 3.8 12/17/2015 0822   CL 105 09/26/2015 1330   CO2 24 12/17/2015 0822   BUN 8.8 12/17/2015 0822   CREATININE 0.7 12/17/2015 0822      Component Value Date/Time   CALCIUM 9.0 12/17/2015 0822   ALKPHOS 91 12/17/2015 0822   AST 16 12/17/2015 0822   ALT 13 12/17/2015 0822   BILITOT 0.39 12/17/2015 0822       Lab Results  Component Value Date   WBC 6.2 12/17/2015   HGB 9.7 (L) 12/17/2015   HCT 28.7 (L) 12/17/2015   MCV 90.0 12/17/2015   PLT 160 12/17/2015   NEUTROABS 4.8 12/17/2015    ASSESSMENT & PLAN:  Breast cancer of upper-outer quadrant of right female breast ([place]) Bilateral  mastectomies 10/01/2015 With reconstruction Right mastectomy: IDC grade 2, multifocal largest 2.5 cm, with high-grade DCIS, broadly present at anterior margin and inferior margin 2/5 LN positive, LVI Present,  Left mastectomy: ALH, ER 95%, PR 5-65%, HER-2 negative, Ki-67 10-20%,  Pathologic stage: T2 N1 a stage IIB Mammaprint: High risk CT-CAP and bone scan: No evidence of metastatic disease ----------------------------------------------------------------------------------------------------------------------------------------------------------------- Treatment plan: 1. Because of lymph node involvement I discussed the risks and benefits of doing axillary lymph node dissection. With the advent of chemotherapy and adjuvant radiation, axillary lymph node dissection has not shown to have substantial benefit. 2. Adjuvant chemotherapy with dose dense Adriamycin and Cytoxan 4 followed by Abraxane weekly 12 ( patient is intolerant to steroids and hence we cannot use paclitaxel) 3. Followed by adjuvant radiation therapy 4. Followed by adjuvant  antiestrogen therapy with tamoxifen 10 years  PREVENT trial: [id number] : No side effects to the study drug ------------------------------------------------------------------------------------------------------------------------------- Current treatment: Cycle 4 day 1 dose dense Adriamycin Cytoxan Chemo Toxicities: 1. Intermittent nausea for which she takes Zofran and Compazine around-the-clock 2. constipation: Take stool softeners 3. Alopecia 4. Mouth sore that was treated with acyclovir and Magic mouthwash: Resolved 5. Severe fatigue for 7 days 6. Chemotherapy-induced anemia: Hemoglobin 9.7 monitoring 7. Dehydration due to poor oral intake: Will set her up for IV fluids on this Friday and Tuesday next week.  RTC in 2 weeks for cycle 1 Abraxane and I will see her back on cycle 2 of Abraxane   No orders of the defined types were placed in this encounter.  The patient has a good understanding of the overall plan. she agrees with it. she will call with any problems that may develop before the next visit here.   Rulon Eisenmenger, MD 12/17/15

## 2015-12-17 NOTE — Patient Instructions (Signed)
[facility] Discharge Instructions for Patients Receiving Chemotherapy  Today you received the following chemotherapy agents : Adriamycin,  Cytoxan.  To help prevent nausea and vomiting after your treatment, we encourage you to take your nausea medication as prescribed.   If you develop nausea and vomiting that is not controlled by your nausea medication, call the clinic.   BELOW ARE SYMPTOMS THAT SHOULD BE REPORTED IMMEDIATELY:  *FEVER GREATER THAN 100.5 F  *CHILLS WITH OR WITHOUT FEVER  NAUSEA AND VOMITING THAT IS NOT CONTROLLED WITH YOUR NAUSEA MEDICATION  *UNUSUAL SHORTNESS OF BREATH  *UNUSUAL BRUISING OR BLEEDING  TENDERNESS IN MOUTH AND THROAT WITH OR WITHOUT PRESENCE OF ULCERS  *URINARY PROBLEMS  *BOWEL PROBLEMS  UNUSUAL RASH Items with * indicate a potential emergency and should be followed up as soon as possible.  Feel free to call the clinic you have any questions or concerns. The clinic phone number is [phone].  Please show the CHEMO ALERT CARD at check-in to the Emergency Department and triage nurse.   

## 2015-12-19 ENCOUNTER — Encounter: Payer: Self-pay | Admitting: *Deleted

## 2015-12-19 ENCOUNTER — Ambulatory Visit (HOSPITAL_BASED_OUTPATIENT_CLINIC_OR_DEPARTMENT_OTHER): Payer: 59

## 2015-12-19 ENCOUNTER — Other Ambulatory Visit: Payer: Self-pay

## 2015-12-19 DIAGNOSIS — Z170 Estrogen receptor positive status [ER+]: Principal | ICD-10-CM

## 2015-12-19 DIAGNOSIS — C50411 Malignant neoplasm of upper-outer quadrant of right female breast: Secondary | ICD-10-CM

## 2015-12-19 MED ORDER — SODIUM CHLORIDE 0.9 % IV SOLN: Freq: Once | INTRAVENOUS | Status: DC

## 2015-12-19 MED ORDER — ONDANSETRON HCL 4 MG/2ML IJ SOLN
8.0000 mg | Freq: Once | INTRAMUSCULAR | Status: AC
  Administered 2015-12-19: 8 mg via INTRAVENOUS

## 2015-12-19 MED ORDER — SODIUM CHLORIDE 0.9% FLUSH
10.0000 mL | INTRAVENOUS | Status: AC | PRN
  Administered 2015-12-19: 10 mL
  Filled 2015-12-19: qty 10

## 2015-12-19 MED ORDER — SODIUM CHLORIDE 0.9 % IV SOLN
Freq: Once | INTRAVENOUS | Status: AC
  Administered 2015-12-19: 10:00:00 via INTRAVENOUS

## 2015-12-19 MED ORDER — HEPARIN SOD (PORK) LOCK FLUSH 100 UNIT/ML IV SOLN
500.0000 [IU] | INTRAVENOUS | Status: AC | PRN
  Administered 2015-12-19: 500 [IU]
  Filled 2015-12-19: qty 5

## 2015-12-19 MED ORDER — ONDANSETRON HCL 4 MG/2ML IJ SOLN
INTRAMUSCULAR | Status: AC
  Filled 2015-12-19: qty 4

## 2015-12-19 NOTE — Patient Instructions (Addendum)
Dehydration, Adult Dehydration is when there is not enough fluid or water in your body. This happens when you lose more fluids than you take in. Dehydration can range from mild to very bad. It should be treated right away to keep it from getting very bad. Symptoms of mild dehydration may include:  Thirst.  Dry lips.  Slightly dry mouth.  Dry, warm skin.  Dizziness. Symptoms of moderate dehydration may include:  Very dry mouth.  Muscle cramps.  Dark pee (urine). Pee may be the color of tea.  Your body making less pee.  Your eyes making fewer tears.  Heartbeat that is uneven or faster than normal (palpitations).  Headache.  Light-headedness, especially when you stand up from sitting.  Fainting (syncope). Symptoms of very bad dehydration may include:  Changes in skin, such as:  Cold and clammy skin.  Blotchy (mottled) or pale skin.  Skin that does not quickly return to normal after being lightly pinched and let go (poor skin turgor).  Changes in body fluids, such as:  Feeling very thirsty.  Your eyes making fewer tears.  Not sweating when body temperature is high, such as in hot weather.  Your body making very little pee.  Changes in vital signs, such as:  Weak pulse.  Pulse that is more than 100 beats a minute when you are sitting still.  Fast breathing.  Low blood pressure.  Other changes, such as:  Sunken eyes.  Cold hands and feet.  Confusion.  Lack of energy (lethargy).  Trouble waking up from sleep.  Short-term weight loss.  Unconsciousness. Follow these instructions at home:  If told by your doctor, drink an ORS:  Make an ORS by using instructions on the package.  Start by drinking small amounts, about  cup (120 mL) every 5-10 minutes.  Slowly drink more until you have had the amount that your doctor said to have.  Drink enough clear fluid to keep your pee clear or pale yellow. If you were told to drink an ORS, finish the ORS  first, then start slowly drinking clear fluids. Drink fluids such as:  Water. Do not drink only water by itself. Doing that can make the salt (sodium) level in your body get too low (hyponatremia).  Ice chips.  Fruit juice that you have added water to (diluted).  Low-calorie sports drinks.  Avoid:  Alcohol.  Drinks that have a lot of sugar. These include high-calorie sports drinks, fruit juice that does not have water added, and soda.  Caffeine.  Foods that are greasy or have a lot of fat or sugar.  Take over-the-counter and prescription medicines only as told by your doctor.  Do not take salt tablets. Doing that can make the salt level in your body get too high (hypernatremia).  Eat foods that have minerals (electrolytes). Examples include bananas, oranges, potatoes, tomatoes, and spinach.  Keep all follow-up visits as told by your doctor. This is important. Contact a doctor if:  You have belly (abdominal) pain that:  Gets worse.  Stays in one area (localizes).  You have a rash.  You have a stiff neck.  You get angry or annoyed more easily than normal (irritability).  You are more sleepy than normal.  You have a harder time waking up than normal.  You feel:  Weak.  Dizzy.  Very thirsty.  You have peed (urinated) only a small amount of very dark pee during 6-8 hours. Get help right away if:  You have symptoms of  very bad dehydration.  You cannot drink fluids without throwing up (vomiting).  Your symptoms get worse with treatment.  You have a fever.  You have a very bad headache.  You are throwing up or having watery poop (diarrhea) and it:  Gets worse.  Does not go away.  You have blood or something green (bile) in your throw-up.  You have blood in your poop (stool). This may cause poop to look black and tarry.  You have not peed in 6-8 hours.  You pass out (faint).  Your heart rate when you are sitting still is more than 100 beats a  minute.  You have trouble breathing. This information is not intended to replace advice given to you by your health care provider. Make sure you discuss any questions you have with your health care provider. Document Released: 10/17/2008 Document Revised: 07/11/2015 Document Reviewed: 02/14/2015 Elsevier Interactive Patient Education  2017 [place].  Ondansetron injection What is this medicine? ONDANSETRON (on DAN se tron) is used to treat nausea and vomiting caused by chemotherapy. It is also used to prevent or treat nausea and vomiting after surgery. This medicine may be used for other purposes; ask your health care provider or pharmacist if you have questions. COMMON BRAND NAME(S): Zofran What should I tell my health care provider before I take this medicine? They need to know if you have any of these conditions: -heart disease -history of irregular heartbeat -liver disease -low levels of magnesium or potassium in the blood -an unusual or allergic reaction to ondansetron, granisetron, other medicines, foods, dyes, or preservatives -pregnant or trying to get pregnant -breast-feeding How should I use this medicine? This medicine is for infusion into a vein. It is given by a health care professional in a hospital or clinic setting. Talk to your pediatrician regarding the use of this medicine in children. Special care may be needed. Overdosage: If you think you have taken too much of this medicine contact a poison control center or emergency room at once. NOTE: This medicine is only for you. Do not share this medicine with others. What if I miss a dose? This does not apply. What may interact with this medicine? Do not take this medicine with any of the following medications: -apomorphine -certain medicines for fungal infections like fluconazole, itraconazole, ketoconazole, posaconazole,  voriconazole -cisapride -dofetilide -dronedarone -pimozide -thioridazine -ziprasidone This medicine may also interact with the following medications: -carbamazepine -certain medicines for depression, anxiety, or psychotic disturbances -fentanyl -linezolid -MAOIs like Carbex, Eldepryl, Marplan, Nardil, and Parnate -methylene blue (injected into a vein) -other medicines that prolong the QT interval (cause an abnormal heart rhythm) -phenytoin -rifampicin -tramadol This list may not describe all possible interactions. Give your health care provider a list of all the medicines, herbs, non-prescription drugs, or dietary supplements you use. Also tell them if you smoke, drink alcohol, or use illegal drugs. Some items may interact with your medicine. What should I watch for while using this medicine? Your condition will be monitored carefully while you are receiving this medicine. What side effects may I notice from receiving this medicine? Side effects that you should report to your doctor or health care professional as soon as possible: -allergic reactions like skin rash, itching or hives, swelling of the face, lips, or tongue -breathing problems -confusion -dizziness -fast or irregular heartbeat -feeling faint or lightheaded, falls -fever and chills -loss of balance or coordination -seizures -sweating -swelling of the hands and feet -tightness in the chest -tremors -unusually  weak or tired Side effects that usually do not require medical attention (report to your doctor or health care professional if they continue or are bothersome): -constipation or diarrhea -headache This list may not describe all possible side effects. Call your doctor for medical advice about side effects. You may report side effects to FDA at 1-800-FDA-1088. Where should I keep my medicine? This drug is given in a hospital or clinic and will not be stored at home. NOTE: This sheet is a summary. It may not  cover all possible information. If you have questions about this medicine, talk to your doctor, pharmacist, or health care provider.  2017 Elsevier/Gold Standard (2012-09-27 16:18:28)

## 2015-12-23 ENCOUNTER — Encounter: Payer: 59 | Admitting: Physical Therapy

## 2015-12-23 ENCOUNTER — Ambulatory Visit: Payer: 59

## 2015-12-30 ENCOUNTER — Encounter: Payer: 59 | Admitting: Physical Therapy

## 2015-12-30 NOTE — Progress Notes (Signed)
Consult Note: Gyn-Onc  Consult was requested by Dr. Donne Hazel for the evaluation of Melanie Barber 50 y.o. female  CC:  Chief Complaint  Patient presents with  . hormone receptor positive breast cancer    consideration for oophorectomy    Assessment/Plan:  Melanie Barber  is a 50 y.o.  year old woman with a personal history of premenopausal right sided ER/PR positive breast cancer. Recommendation is for oophorectomy for surgical castration to reduce risk for recurrence.  I discussed that this is a reasonable option and could be accomplished through a minimally invasive fashion.  I would recommend performing robotic assisted BSO in late May, 2018 after she has completed primary therapy for her breast cancer. We will see her back in early May to discuss further.  I discussed the role of hysterectomy in her surgery. She has a grandmother with a history of uterine cancer in her 14's/70's, but no other family history for uterine/colon cancer, no deleterious mutations identified on genetic testing. I discussed that hysterectomy increases surgical risk and recovery beyond BSO alone. She is overall at low surgical risk and this could be accomplished if she desires, however, I discussed there is likely limited to no added benefit to hysterectomy.   HPI: Melanie Barber is a 50 year old woman who is seen in consultation at the request of Dr Donne Hazel for surgical castration in the setting of right ER/PR receptor positive breast cancer (s/p bilateral mastectomies (most recently right in September 2017) followed by adjuvant chemotherapy. She is currently receiving abraxane chemotherapy with Dr Sonny Dandy, for a planned additional 3 months, followed by chest wall radiation. She will then have implants placed 6 months after completing radiation.  She met with Dr Terrall Laity at [facility] for consultation and he recommended surgical castration so that she could avoid tamoxifen and lupron.   She underwent  genetic testing which did not identify known deleterious mutations that predispose to breast/ovarian/uterine cancers.  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 She is healthy with no prior abdominal surgical history and has had one prior SVD.   Current Meds:  Outpatient Encounter Prescriptions as of 12/31/2015  Medication Sig  . lidocaine-prilocaine (EMLA) cream Apply to affected area once  . LORazepam (ATIVAN) 0.5 MG tablet Take by mouth.  . methocarbamol (ROBAXIN) 500 MG tablet Take 500 mg by mouth.  . ondansetron (ZOFRAN) 8 MG tablet Take by mouth.  . oxyCODONE (OXY IR/ROXICODONE) 5 MG immediate release tablet Take by mouth.  . prochlorperazine (COMPAZINE) 10 MG tablet Take by mouth.  Marland Kitchen acetaminophen (TYLENOL) 500 MG tablet Take 1,000 mg by mouth.  . Atorvastatin Calcium (INVESTIGATIONAL ATORVASTATIN/PLACEBO) 40 MG tablet [facility] [phone] Take 1 tablet by mouth daily. Take 2 doses (these doses must be 12 hours apart) prior to first chemotherapy treatment. Then take 1 tablet daily by mouth with or without food.  Marland Kitchen ibuprofen (ADVIL,MOTRIN) 200 MG tablet Take 400 mg by mouth every 6 (six) hours as needed for mild pain.  Marland Kitchen levonorgestrel (MIRENA) 20 MCG/24HR IUD 1 each by Intrauterine route once.  . magic mouthwash w/lidocaine SOLN Take 5 mLs by mouth 4 (four) times daily.  . Multiple Vitamin (MULTIVITAMIN WITH MINERALS) TABS tablet Take 1 tablet by mouth daily.  . promethazine (PHENERGAN) 25 MG tablet Take 1 tablet (25 mg total) by mouth every 6 (six) hours as needed for nausea.  . valACYclovir (VALTREX) 1000 MG tablet Take 1 tablet (1,000 mg total) by mouth daily.  . [  DISCONTINUED] lidocaine-prilocaine (EMLA) cream Apply to affected area once  . [DISCONTINUED] LORazepam (ATIVAN) 0.5 MG tablet Take 1 tablet (0.5 mg total) by mouth every 6 (six) hours as needed (Nausea or vomiting). (Patient not taking: Reported on 11/04/2015)  . [DISCONTINUED] ondansetron (ZOFRAN) 8 MG tablet Take 1  tablet (8 mg total) by mouth 2 (two) times daily as needed. Start on the third day after chemotherapy.  . [DISCONTINUED] prochlorperazine (COMPAZINE) 10 MG tablet Take 1 tablet (10 mg total) by mouth every 6 (six) hours as needed (Nausea or vomiting).  . [DISCONTINUED] sodium chloride flush (NS) 0.9 % injection 10 mL    No facility-administered encounter medications on file as of 12/31/2015.     Allergy:  Allergies  Allergen Reactions  . Keflex [Cephalexin] Anaphylaxis  . Decadron [Dexamethasone] Anxiety    Social Hx:   Social History   Social History  . Marital status: Divorced    Spouse name: N/A  . Number of children: N/A  . Years of education: N/A   Occupational History  . Not on file.   Social History Main Topics  . Smoking status: Never Smoker  . Smokeless tobacco: Never Used  . Alcohol use Yes     Comment: socially  . Drug use: No  . Sexual activity: Yes    Birth control/ protection: IUD   Other Topics Concern  . Not on file   Social History Narrative  . No narrative on file    Past Surgical Hx:  Past Surgical History:  Procedure Laterality Date  . BREAST BIOPSY Right   . BREAST CYST ASPIRATION Right   . BREAST RECONSTRUCTION WITH PLACEMENT OF TISSUE EXPANDER AND FLEX HD (ACELLULAR HYDRATED DERMIS) Bilateral 10/01/2015  . BREAST RECONSTRUCTION WITH PLACEMENT OF TISSUE EXPANDER AND FLEX HD (ACELLULAR HYDRATED DERMIS) Bilateral 10/01/2015   Procedure: BREAST RECONSTRUCTION WITH PLACEMENT OF TISSUE EXPANDER AND CORTIVA;  Surgeon: Irene Limbo, MD;  Location: [facility];  Service: Plastics;  Laterality: Bilateral;  . [place]   growth removed  . MASTECTOMY Left 10/01/2015   NIPPLE SPARING  . MASTECTOMY COMPLETE / SIMPLE W/ SENTINEL NODE BIOPSY Right 10/01/2015   NIPPLE SPARING  . NASAL/SINUS ENDOSCOPY Bilateral 2005  . NIPPLE SPARING MASTECTOMY/SENTINAL LYMPH NODE BIOPSY/RECONSTRUCTION/PLACEMENT OF TISSUE EXPANDER Bilateral 10/01/2015   Procedure:  BILATERAL NIPPLE SPARING MASTECTOMY WITH RIGHT SENTINAL LYMPH NODE BIOPSY;  Surgeon: Rolm Bookbinder, MD;  Location: [facility];  Service: General;  Laterality: Bilateral;  . PORTACATH PLACEMENT Right 10/01/2015  . PORTACATH PLACEMENT Right 10/01/2015   Procedure: INSERTION PORT-A-CATH;  Surgeon: Rolm Bookbinder, MD;  Location: [place];  Service: General;  Laterality: Right;  . TOE SURGERY Left 1990   "bone spur on great toe"  . TONSILLECTOMY  1977  . ULTRASOUND GUIDANCE FOR VASCULAR ACCESS Right 10/01/2015   Procedure: ULTRASOUND GUIDANCE;  Surgeon: Rolm Bookbinder, MD;  Location: [place];  Service: General;  Laterality: Right;  . WISDOM TOOTH EXTRACTION      Past Medical Hx:  Past Medical History:  Diagnosis Date  . Arthritis    right knee (10/01/2015)  . Cancer of right breast ([place])   . Migraine    "once when I was going thru a divorce; called them cluster" (10/01/2015)  . PONV (postoperative nausea and vomiting)   . Sciatic leg pain    left leg    Past Gynecological History:  SVD x 1. No prior abnormal paps. Mirena since 2015. No LMP recorded. Patient has had an implant.  Family Hx:  Family History  Problem Relation Age of Onset  . Depression Mother   . Inflammatory bowel disease Mother   . Alcohol abuse Mother   . Bipolar disorder Mother   . Hypertension Son   . Other Brother     severe intellectual disabilities  . Arthritis Maternal Uncle     hx knee replacement surgeries  . Stroke Maternal Grandmother 86  . Breast cancer Maternal Grandmother     dx before menopause  . Uterine cancer Maternal Grandmother     dx late 56s  . Bipolar disorder Maternal Grandfather   . Stroke Paternal Grandmother 20  . Diabetes Paternal Grandmother     later in life  . Heart attack Paternal Grandfather     Review of Systems:  Constitutional  Feels well,    ENT Normal appearing ears and nares bilaterally Skin/Breast  No rash, sores, jaundice, itching, dryness Cardiovascular  No  chest pain, shortness of breath, or edema  Pulmonary  No cough or wheeze.  Gastro Intestinal  No nausea, vomitting, or diarrhoea. No bright red blood per rectum, no abdominal pain, change in bowel movement, or constipation.  Genito Urinary  No frequency, urgency, dysuria, no bleeding Musculo Skeletal  No myalgia, arthralgia, joint swelling or pain  Neurologic  No weakness, numbness, change in gait,  Psychology  No depression, anxiety, insomnia.   Vitals:  Blood pressure 131/71, pulse 91, temperature 98 F (36.7 C), temperature source Oral, resp. rate 19, SpO2 100 %.  Physical Exam: WD in NAD Neck  Supple NROM, without any enlargements.  Lymph Node Survey No cervical supraclavicular or inguinal adenopathy Cardiovascular  Pulse normal rate, regularity and rhythm. S1 and S2 normal.  Lungs  Clear to auscultation bilateraly, without wheezes/crackles/rhonchi. Good air movement.  Skin  No rash/lesions/breakdown  Psychiatry  Alert and oriented to person, place, and time  Abdomen  Normoactive bowel sounds, abdomen soft, non-tender and nonobese without evidence of hernia.  Back No CVA tenderness Genito Urinary  Vulva/vagina: Normal external female genitalia.  No lesions. No discharge or bleeding.  Bladder/urethra:  No lesions or masses, well supported bladder  Vagina: normal  Cervix: Normal appearing, no lesions.  Uterus:  Small, mobile, no parametrial involvement or nodularity.  Adnexa: no palpable masses. Rectal  denied  Extremities  No bilateral cyanosis, clubbing or edema.   Donaciano Eva, MD  12/31/2015, 2:08 PM

## 2015-12-31 ENCOUNTER — Ambulatory Visit (HOSPITAL_BASED_OUTPATIENT_CLINIC_OR_DEPARTMENT_OTHER): Payer: 59

## 2015-12-31 ENCOUNTER — Ambulatory Visit: Payer: 59 | Attending: Gynecologic Oncology | Admitting: Gynecologic Oncology

## 2015-12-31 ENCOUNTER — Encounter: Payer: Self-pay | Admitting: Gynecologic Oncology

## 2015-12-31 ENCOUNTER — Other Ambulatory Visit (HOSPITAL_BASED_OUTPATIENT_CLINIC_OR_DEPARTMENT_OTHER): Payer: 59

## 2015-12-31 ENCOUNTER — Other Ambulatory Visit: Payer: Self-pay | Admitting: Hematology

## 2015-12-31 ENCOUNTER — Other Ambulatory Visit: Payer: Self-pay | Admitting: *Deleted

## 2015-12-31 VITALS — BP 114/71 | HR 89 | Temp 98.6°F | Resp 20

## 2015-12-31 VITALS — BP 131/71 | HR 91 | Temp 98.0°F | Resp 19

## 2015-12-31 DIAGNOSIS — Z170 Estrogen receptor positive status [ER+]: Secondary | ICD-10-CM | POA: Diagnosis not present

## 2015-12-31 DIAGNOSIS — C50411 Malignant neoplasm of upper-outer quadrant of right female breast: Secondary | ICD-10-CM | POA: Diagnosis not present

## 2015-12-31 DIAGNOSIS — Z5111 Encounter for antineoplastic chemotherapy: Secondary | ICD-10-CM

## 2015-12-31 DIAGNOSIS — C50911 Malignant neoplasm of unspecified site of right female breast: Secondary | ICD-10-CM | POA: Diagnosis not present

## 2015-12-31 DIAGNOSIS — Z9013 Acquired absence of bilateral breasts and nipples: Secondary | ICD-10-CM | POA: Insufficient documentation

## 2015-12-31 LAB — COMPREHENSIVE METABOLIC PANEL
ALT: 13 U/L (ref 0–55)
AST: 17 U/L (ref 5–34)
Albumin: 3.9 g/dL (ref 3.5–5.0)
Alkaline Phosphatase: 92 U/L (ref 40–150)
Anion Gap: 8 mEq/L (ref 3–11)
BUN: 9.7 mg/dL (ref 7.0–26.0)
CALCIUM: 8.9 mg/dL (ref 8.4–10.4)
CHLORIDE: 108 meq/L (ref 98–109)
CO2: 25 mEq/L (ref 22–29)
CREATININE: 0.7 mg/dL (ref 0.6–1.1)
EGFR: >90 mL/min/{1.73_m2} (ref >90)
GLUCOSE: 92 mg/dL (ref 70–140)
POTASSIUM: 4.2 meq/L (ref 3.5–5.1)
SODIUM: 140 meq/L (ref 136–145)
Total Bilirubin: 0.38 mg/dL (ref 0.20–1.20)
Total Protein: 6.7 g/dL (ref 6.4–8.3)

## 2015-12-31 LAB — CBC WITH DIFFERENTIAL/PLATELET
BASO%: 0.3 % (ref 0.0–2.0)
Basophils Absolute: 0 10*3/uL (ref 0.0–0.1)
EOS%: 0.3 % (ref 0.0–7.0)
Eosinophils Absolute: 0 10*3/uL (ref 0.0–0.5)
HEMATOCRIT: 27.4 % — AB (ref 34.8–46.6)
HEMOGLOBIN: 9.1 g/dL — AB (ref 11.6–15.9)
LYMPH#: 0.8 10*3/uL — AB (ref 0.9–3.3)
LYMPH%: 8.4 % — ABNORMAL LOW (ref 14.0–49.7)
MCH: 30.6 pg (ref 25.1–34.0)
MCHC: 33.2 g/dL (ref 31.5–36.0)
MCV: 92.3 fL (ref 79.5–101.0)
MONO#: 0.8 10*3/uL (ref 0.1–0.9)
MONO%: 8.3 % (ref 0.0–14.0)
NEUT#: 7.5 10*3/uL — ABNORMAL HIGH (ref 1.5–6.5)
NEUT%: 82.7 % — ABNORMAL HIGH (ref 38.4–76.8)
Platelets: 136 10*3/uL — ABNORMAL LOW (ref 145–400)
RBC: 2.97 10*6/uL — ABNORMAL LOW (ref 3.70–5.45)
RDW: 17.1 % — AB (ref 11.2–14.5)
WBC: 9.1 10*3/uL (ref 3.9–10.3)

## 2015-12-31 MED ORDER — SODIUM CHLORIDE 0.9 % IV SOLN
Freq: Once | INTRAVENOUS | Status: AC
  Administered 2015-12-31: 11:00:00 via INTRAVENOUS

## 2015-12-31 MED ORDER — HEPARIN SOD (PORK) LOCK FLUSH 100 UNIT/ML IV SOLN
500.0000 [IU] | Freq: Once | INTRAVENOUS | Status: AC | PRN
  Administered 2015-12-31: 500 [IU]
  Filled 2015-12-31: qty 5

## 2015-12-31 MED ORDER — PROCHLORPERAZINE MALEATE 10 MG PO TABS
ORAL_TABLET | ORAL | Status: AC
  Filled 2015-12-31: qty 1

## 2015-12-31 MED ORDER — PACLITAXEL PROTEIN-BOUND CHEMO INJECTION 100 MG
80.0000 mg/m2 | Freq: Once | INTRAVENOUS | Status: AC
  Administered 2015-12-31: 150 mg via INTRAVENOUS
  Filled 2015-12-31: qty 30

## 2015-12-31 MED ORDER — SODIUM CHLORIDE 0.9% FLUSH
10.0000 mL | INTRAVENOUS | Status: DC | PRN
  Administered 2015-12-31: 10 mL
  Filled 2015-12-31: qty 10

## 2015-12-31 MED ORDER — PROCHLORPERAZINE MALEATE 10 MG PO TABS
10.0000 mg | ORAL_TABLET | Freq: Once | ORAL | Status: AC
  Administered 2015-12-31: 10 mg via ORAL

## 2015-12-31 NOTE — Patient Instructions (Signed)
[facility] Discharge Instructions for Patients Receiving Chemotherapy  Today you received the following chemotherapy agents Abraxane  To help prevent nausea and vomiting after your treatment, we encourage you to take your nausea medication    If you develop nausea and vomiting that is not controlled by your nausea medication, call the clinic.   BELOW ARE SYMPTOMS THAT SHOULD BE REPORTED IMMEDIATELY:  *FEVER GREATER THAN 100.5 F  *CHILLS WITH OR WITHOUT FEVER  NAUSEA AND VOMITING THAT IS NOT CONTROLLED WITH YOUR NAUSEA MEDICATION  *UNUSUAL SHORTNESS OF BREATH  *UNUSUAL BRUISING OR BLEEDING  TENDERNESS IN MOUTH AND THROAT WITH OR WITHOUT PRESENCE OF ULCERS  *URINARY PROBLEMS  *BOWEL PROBLEMS  UNUSUAL RASH Items with * indicate a potential emergency and should be followed up as soon as possible.  Feel free to call the clinic you have any questions or concerns. The clinic phone number is [phone].  Please show the [place] at check-in to the Emergency Department and triage nurse.  Nanoparticle Albumin-Bound Paclitaxel injection What is this medicine? NANOPARTICLE ALBUMIN-BOUND PACLITAXEL (Na no PAHR ti kuhl al BYOO muhn-bound PAK li TAX el) is a chemotherapy drug. It targets fast dividing cells, like cancer cells, and causes these cells to die. This medicine is used to treat advanced breast cancer and advanced lung cancer. This medicine may be used for other purposes; ask your health care provider or pharmacist if you have questions. COMMON BRAND NAME(S): Abraxane What should I tell my health care provider before I take this medicine? They need to know if you have any of these conditions: -kidney disease -liver disease -low blood counts, like low platelets, red blood cells, or white blood cells -recent or ongoing radiation therapy -an unusual or allergic reaction to paclitaxel, albumin, other chemotherapy, other medicines, foods, dyes, or  preservatives -pregnant or trying to get pregnant -breast-feeding How should I use this medicine? This drug is given as an infusion into a vein. It is administered in a hospital or clinic by a specially trained health care professional. Talk to your pediatrician regarding the use of this medicine in children. Special care may be needed. Overdosage: If you think you have taken too much of this medicine contact a poison control center or emergency room at once. NOTE: This medicine is only for you. Do not share this medicine with others. What if I miss a dose? It is important not to miss your dose. Call your doctor or health care professional if you are unable to keep an appointment. What may interact with this medicine? -cyclosporine -diazepam -ketoconazole -medicines to increase blood counts like filgrastim, pegfilgrastim, sargramostim -other chemotherapy drugs like cisplatin, doxorubicin, epirubicin, etoposide, teniposide, vincristine -quinidine -testosterone -vaccines -verapamil Talk to your doctor or health care professional before taking any of these medicines: -acetaminophen -aspirin -ibuprofen -ketoprofen -naproxen This list may not describe all possible interactions. Give your health care provider a list of all the medicines, herbs, non-prescription drugs, or dietary supplements you use. Also tell them if you smoke, drink alcohol, or use illegal drugs. Some items may interact with your medicine. What should I watch for while using this medicine? Your condition will be monitored carefully while you are receiving this medicine. You will need important blood work done while you are taking this medicine. This medicine can cause serious allergic reactions. If you experience allergic reactions like skin rash, itching or hives, swelling of the face, lips, or tongue, tell your doctor or health care professional right away.  In some cases, you may be given additional medicines to help with  side effects. Follow all directions for their use. This drug may make you feel generally unwell. This is not uncommon, as chemotherapy can affect healthy cells as well as cancer cells. Report any side effects. Continue your course of treatment even though you feel ill unless your doctor tells you to stop. Call your doctor or health care professional for advice if you get a fever, chills or sore throat, or other symptoms of a cold or flu. Do not treat yourself. This drug decreases your body's ability to fight infections. Try to avoid being around people who are sick. This medicine may increase your risk to bruise or bleed. Call your doctor or health care professional if you notice any unusual bleeding. Be careful brushing and flossing your teeth or using a toothpick because you may get an infection or bleed more easily. If you have any dental work done, tell your dentist you are receiving this medicine. Avoid taking products that contain aspirin, acetaminophen, ibuprofen, naproxen, or ketoprofen unless instructed by your doctor. These medicines may hide a fever. Do not become pregnant while taking this medicine. Women should inform their doctor if they wish to become pregnant or think they might be pregnant. There is a potential for serious side effects to an unborn child. Talk to your health care professional or pharmacist for more information. Do not breast-feed an infant while taking this medicine. Men are advised not to father a child while receiving this medicine. What side effects may I notice from receiving this medicine? Side effects that you should report to your doctor or health care professional as soon as possible: -allergic reactions like skin rash, itching or hives, swelling of the face, lips, or tongue -low blood counts - This drug may decrease the number of white blood cells, red blood cells and platelets. You may be at increased risk for infections and bleeding. -signs of infection -  fever or chills, cough, sore throat, pain or difficulty passing urine -signs of decreased platelets or bleeding - bruising, pinpoint red spots on the skin, black, tarry stools, nosebleeds -signs of decreased red blood cells - unusually weak or tired, fainting spells, lightheadedness -breathing problems -changes in vision -chest pain -high or low blood pressure -mouth sores -nausea and vomiting -pain, swelling, redness or irritation at the injection site -pain, tingling, numbness in the hands or feet -slow or irregular heartbeat -swelling of the ankle, feet, hands Side effects that usually do not require medical attention (report to your doctor or health care professional if they continue or are bothersome): -aches, pains -changes in the color of fingernails -diarrhea -hair loss -loss of appetite This list may not describe all possible side effects. Call your doctor for medical advice about side effects. You may report side effects to FDA at 1-800-FDA-1088. Where should I keep my medicine? This drug is given in a hospital or clinic and will not be stored at home. NOTE: This sheet is a summary. It may not cover all possible information. If you have questions about this medicine, talk to your doctor, pharmacist, or health care provider.  2017 [employer]vier/Gold Standard (2014-10-23 10:05:20)

## 2016-01-06 ENCOUNTER — Other Ambulatory Visit: Payer: Self-pay | Admitting: Emergency Medicine

## 2016-01-06 ENCOUNTER — Encounter: Payer: 59 | Admitting: Physical Therapy

## 2016-01-06 DIAGNOSIS — C50411 Malignant neoplasm of upper-outer quadrant of right female breast: Secondary | ICD-10-CM

## 2016-01-06 DIAGNOSIS — Z170 Estrogen receptor positive status [ER+]: Principal | ICD-10-CM

## 2016-01-06 NOTE — Progress Notes (Signed)
Patient Care Team: No Pcp Per Patient as PCP - General (General Practice)  DIAGNOSIS:  Encounter Diagnosis  Name Primary?  . Malignant neoplasm of upper-outer quadrant of right breast in female, estrogen receptor positive ([place])     SUMMARY OF ONCOLOGIC HISTORY:   Breast cancer of upper-outer quadrant of right female breast ([place])   09/02/2015 Mammogram    Right breast UOQ: 2 masses 3 cm apart, U/S they measured 3.4 cm, larger mass at 1.6 cm, in addition, 1 cm mass at 9:00 and 0.9 cm mass at 8:30( could be intramammary LN); right axillary LN 11 mm; microcalcs 10 cm span       09/09/2015 Initial Diagnosis    Right breast biopsy OUQ: DCIS with calcs and necrosis ER 95%, PR 80%; 9:30 position 5cmfn: IDC with DCIS ER 95%, PR 65%, Ki-67 20%,; right biopsy 8:30 position 3cmfn ER 95%, PR 5%, Ki-67 10%: intramammary LN with IDC      09/12/2015 Breast MRI    Multicentric right breast cancer cluster of enhancing masses 4.1 x 2.2 x 2.6 cm, LOQ: 1.1 cm enhancing mass previously biopsied, subcutaneous low right breast 2 cm mass not biopsied, multiple other small enhancing masses, 1 cm right axillary lymph node      10/01/2015 Surgery    Right mastectomy: IDC grade 2, multifocal largest 2.5 cm, with high-grade DCIS, broadly present at  anterior margin and inferior margin 2/5 LN positive, LVI Present,  Left mastectomy: ALH, ER 95%, PR 5-65%, HER-2 negative, Ki-67 10-20%, T2 N1 a stage IIB      11/05/2015 -  Chemotherapy    Adjuvant chemotherapy with dose dense Adriamycin and Cytoxan 4 followed by Abraxane weekly 12       CHIEF COMPLIANT: Cycle 2 Abraxane  INTERVAL HISTORY: Melanie Barber is a 45 year with above-mentioned history of right breast cancer treated with mastectomy and is currently on adjuvant chemotherapy. Today is cycle 2 Abraxane. She tolerated cycle 1 fairly well. She had anticipated 3 nausea coming in today  REVIEW OF SYSTEMS:   Constitutional: Denies fevers, chills or abnormal  weight loss Eyes: Denies blurriness of vision Ears, nose, mouth, throat, and face: Denies mucositis or sore throat Respiratory: Denies cough, dyspnea or wheezes Cardiovascular: Denies palpitation, chest discomfort Gastrointestinal:  Denies nausea, heartburn or change in bowel habits Skin: Denies abnormal skin rashes Lymphatics: Denies new lymphadenopathy or easy bruising Neurological:Denies numbness, tingling or new weaknesses Behavioral/Psych: Mood is stable, no new changes  Extremities: No lower extremity edema  All other systems were reviewed with the patient and are negative.  I have reviewed the past medical history, past surgical history, social history and family history with the patient and they are unchanged from previous note.  ALLERGIES:  is allergic to keflex [cephalexin] and decadron [dexamethasone].  MEDICATIONS:  Current Outpatient Prescriptions  Medication Sig Dispense Refill  . acetaminophen (TYLENOL) 500 MG tablet Take 1,000 mg by mouth.    . Atorvastatin Calcium (INVESTIGATIONAL ATORVASTATIN/PLACEBO) 40 MG tablet [facility] [phone] Take 1 tablet by mouth daily. Take 2 doses (these doses must be 12 hours apart) prior to first chemotherapy treatment. Then take 1 tablet daily by mouth with or without food. 180 tablet 0  . ibuprofen (ADVIL,MOTRIN) 200 MG tablet Take 400 mg by mouth every 6 (six) hours as needed for mild pain.    Marland Kitchen levonorgestrel (MIRENA) 20 MCG/24HR IUD 1 each by Intrauterine route once.    Marland Kitchen LORazepam (ATIVAN) 0.5 MG tablet Take by  mouth.    . Multiple Vitamin (MULTIVITAMIN WITH MINERALS) TABS tablet Take 1 tablet by mouth daily.    . ondansetron (ZOFRAN) 8 MG tablet Take by mouth.    . oxyCODONE (OXY IR/ROXICODONE) 5 MG immediate release tablet Take by mouth.    . prochlorperazine (COMPAZINE) 10 MG tablet Take by mouth.    . promethazine (PHENERGAN) 25 MG tablet Take 1 tablet (25 mg total) by mouth every 6 (six) hours as needed for nausea. 30 tablet 3    No current facility-administered medications for this visit.     PHYSICAL EXAMINATION: ECOG PERFORMANCE STATUS: 1  Vitals:   01/07/16 0916  BP: 112/71  Pulse: (!) 104  Resp: 18  Temp: 97.9 F (36.6 C)   Filed Weights   01/07/16 0916  Weight: 184 lb 14.4 oz (83.9 kg)    GENERAL:alert, no distress and comfortable SKIN: skin color, texture, turgor are normal, no rashes or significant lesions EYES: normal, Conjunctiva are pink and non-injected, sclera clear OROPHARYNX:no exudate, no erythema and lips, buccal mucosa, and tongue normal  NECK: supple, thyroid normal size, non-tender, without nodularity LYMPH:  no palpable lymphadenopathy in the cervical, axillary or inguinal LUNGS: clear to auscultation and percussion with normal breathing effort HEART: regular rate & rhythm and no murmurs and no lower extremity edema ABDOMEN:abdomen soft, non-tender and normal bowel sounds MUSCULOSKELETAL:no cyanosis of digits and no clubbing  NEURO: alert & oriented x 3 with fluent speech, no focal motor/sensory deficits EXTREMITIES: No lower extremity edema  LABORATORY DATA:  I have reviewed the data as listed   Chemistry      Component Value Date/Time   NA 140 01/07/2016 0852   K 4.0 01/07/2016 0852   CL 105 09/26/2015 1330   CO2 26 01/07/2016 0852   BUN 13.1 01/07/2016 0852   CREATININE 0.7 01/07/2016 0852      Component Value Date/Time   CALCIUM 9.3 01/07/2016 0852   ALKPHOS 82 01/07/2016 0852   AST 20 01/07/2016 0852   ALT 20 01/07/2016 0852   BILITOT 0.54 01/07/2016 0852       Lab Results  Component Value Date   WBC 3.5 (L) 01/07/2016   HGB 8.6 (L) 01/07/2016   HCT 25.5 (L) 01/07/2016   MCV 92.1 01/07/2016   PLT 220 01/07/2016   NEUTROABS 2.7 01/07/2016    ASSESSMENT & PLAN:  Breast cancer of upper-outer quadrant of right female breast ([place]) Bilateral mastectomies 10/01/2015 With reconstruction Right mastectomy: IDC grade 2, multifocal largest 2.5 cm, with  high-grade DCIS, broadly present at anterior margin and inferior margin 2/5 LN positive, LVI Present,  Left mastectomy: ALH, ER 95%, PR 5-65%, HER-2 negative, Ki-67 10-20%,  Pathologic stage: T2 N1 a stage IIB Mammaprint: High risk CT-CAP and bone scan: No evidence of metastatic disease ----------------------------------------------------------------------------------------------------------------------------------------------------------------- Treatment plan: 1. Because of lymph node involvement I discussed the risks and benefits of doing axillary lymph node dissection. With the advent of chemotherapy and adjuvant radiation, axillary lymph node dissection has not shown to have substantial benefit. 2. Adjuvant chemotherapy with dose dense Adriamycin and Cytoxan 4 followed by Abraxane weekly 12 ( patient is intolerant to steroids and hence we cannot use paclitaxel) 3. Followed by adjuvant radiation therapy 4. Followed by adjuvant antiestrogen therapy with tamoxifen 10 years  PREVENT trial: [id number]  ------------------------------------------------------------------------------------------------------------------------------- Current treatment: Completed 4 cycles of dose dense Adriamycin Cytoxan. Today is cycle 2 Abraxane  Chemo Toxicities: 1. Intermittent nausea for which she takes Zofran and Compazine around-the-clock 2.  constipation: Take stool softeners 3. Alopecia 4. Mouth sore that was treated with acyclovir: Resolved 5. Chemotherapy-induced anemia: Hemoglobin down to 8.6. We are watching and monitoring it. This explains her fatigue and shortness of breath to exertion.  RTC in 2 weeks for cycle 4 Abraxane  No orders of the defined types were placed in this encounter.  The patient has a good understanding of the overall plan. she agrees with it. she will call with any problems that may develop before the next visit here.   Rulon Eisenmenger, MD 01/07/16

## 2016-01-06 NOTE — Assessment & Plan Note (Addendum)
Bilateral mastectomies 10/01/2015 With reconstruction Right mastectomy: IDC grade 2, multifocal largest 2.5 cm, with high-grade DCIS, broadly present at anterior margin and inferior margin 2/5 LN positive, LVI Present,  Left mastectomy: ALH, ER 95%, PR 5-65%, HER-2 negative, Ki-67 10-20%,  Pathologic stage: T2 N1 a stage IIB Mammaprint: High risk CT-CAP and bone scan: No evidence of metastatic disease ----------------------------------------------------------------------------------------------------------------------------------------------------------------- Treatment plan: 1. Because of lymph node involvement I discussed the risks and benefits of doing axillary lymph node dissection. With the advent of chemotherapy and adjuvant radiation, axillary lymph node dissection has not shown to have substantial benefit. 2. Adjuvant chemotherapy with dose dense Adriamycin and Cytoxan 4 followed by Abraxane weekly 12 ( patient is intolerant to steroids and hence we cannot use paclitaxel) 3. Followed by adjuvant radiation therapy 4. Followed by adjuvant antiestrogen therapy with tamoxifen 10 years  PREVENT trial: [id number]  ------------------------------------------------------------------------------------------------------------------------------- Current treatment: Completed 4 cycles of dose dense Adriamycin Cytoxan. Today is cycle 2 Abraxane  Chemo Toxicities: 1. Intermittent nausea for which she takes Zofran and Compazine around-the-clock 2. constipation: Take stool softeners 3. Alopecia 4. Mouth sore that was treated with acyclovir: Resolved  RTC in 1 weeks for cycle 2 Abraxane

## 2016-01-07 ENCOUNTER — Ambulatory Visit (HOSPITAL_BASED_OUTPATIENT_CLINIC_OR_DEPARTMENT_OTHER): Payer: 59

## 2016-01-07 ENCOUNTER — Encounter: Payer: Self-pay | Admitting: Hematology and Oncology

## 2016-01-07 ENCOUNTER — Ambulatory Visit (HOSPITAL_BASED_OUTPATIENT_CLINIC_OR_DEPARTMENT_OTHER): Payer: 59 | Admitting: Hematology and Oncology

## 2016-01-07 ENCOUNTER — Encounter: Payer: Self-pay | Admitting: *Deleted

## 2016-01-07 ENCOUNTER — Other Ambulatory Visit (HOSPITAL_BASED_OUTPATIENT_CLINIC_OR_DEPARTMENT_OTHER): Payer: 59

## 2016-01-07 VITALS — HR 84

## 2016-01-07 DIAGNOSIS — Z170 Estrogen receptor positive status [ER+]: Principal | ICD-10-CM

## 2016-01-07 DIAGNOSIS — C50411 Malignant neoplasm of upper-outer quadrant of right female breast: Secondary | ICD-10-CM | POA: Diagnosis not present

## 2016-01-07 DIAGNOSIS — L658 Other specified nonscarring hair loss: Secondary | ICD-10-CM

## 2016-01-07 DIAGNOSIS — R110 Nausea: Secondary | ICD-10-CM | POA: Diagnosis not present

## 2016-01-07 DIAGNOSIS — D6481 Anemia due to antineoplastic chemotherapy: Secondary | ICD-10-CM

## 2016-01-07 DIAGNOSIS — Z5111 Encounter for antineoplastic chemotherapy: Secondary | ICD-10-CM

## 2016-01-07 DIAGNOSIS — K5900 Constipation, unspecified: Secondary | ICD-10-CM | POA: Diagnosis not present

## 2016-01-07 LAB — COMPREHENSIVE METABOLIC PANEL
ALT: 20 U/L (ref 0–55)
ANION GAP: 9 meq/L (ref 3–11)
AST: 20 U/L (ref 5–34)
Albumin: 4 g/dL (ref 3.5–5.0)
Alkaline Phosphatase: 82 U/L (ref 40–150)
BILIRUBIN TOTAL: 0.54 mg/dL (ref 0.20–1.20)
BUN: 13.1 mg/dL (ref 7.0–26.0)
CALCIUM: 9.3 mg/dL (ref 8.4–10.4)
CO2: 26 mEq/L (ref 22–29)
CREATININE: 0.7 mg/dL (ref 0.6–1.1)
Chloride: 105 mEq/L (ref 98–109)
EGFR: >90 mL/min/{1.73_m2} (ref >90)
Glucose: 90 mg/dl (ref 70–140)
Potassium: 4 mEq/L (ref 3.5–5.1)
Sodium: 140 mEq/L (ref 136–145)
TOTAL PROTEIN: 6.8 g/dL (ref 6.4–8.3)

## 2016-01-07 LAB — CBC WITH DIFFERENTIAL/PLATELET
BASO%: 0.9 % (ref 0.0–2.0)
BASOS ABS: 0 10*3/uL (ref 0.0–0.1)
EOS%: 0.9 % (ref 0.0–7.0)
Eosinophils Absolute: 0 10*3/uL (ref 0.0–0.5)
HEMATOCRIT: 25.5 % — AB (ref 34.8–46.6)
HGB: 8.6 g/dL — ABNORMAL LOW (ref 11.6–15.9)
LYMPH%: 13.8 % — AB (ref 14.0–49.7)
MCH: 31 pg (ref 25.1–34.0)
MCHC: 33.7 g/dL (ref 31.5–36.0)
MCV: 92.1 fL (ref 79.5–101.0)
MONO#: 0.3 10*3/uL (ref 0.1–0.9)
MONO%: 8.3 % (ref 0.0–14.0)
NEUT%: 76.1 % (ref 38.4–76.8)
NEUTROS ABS: 2.7 10*3/uL (ref 1.5–6.5)
PLATELETS: 220 10*3/uL (ref 145–400)
RBC: 2.77 10*6/uL — ABNORMAL LOW (ref 3.70–5.45)
RDW: 17.8 % — AB (ref 11.2–14.5)
WBC: 3.5 10*3/uL — AB (ref 3.9–10.3)
lymph#: 0.5 10*3/uL — ABNORMAL LOW (ref 0.9–3.3)

## 2016-01-07 MED ORDER — HEPARIN SOD (PORK) LOCK FLUSH 100 UNIT/ML IV SOLN
500.0000 [IU] | Freq: Once | INTRAVENOUS | Status: AC | PRN
  Administered 2016-01-07: 500 [IU]
  Filled 2016-01-07: qty 5

## 2016-01-07 MED ORDER — SODIUM CHLORIDE 0.9 % IV SOLN
Freq: Once | INTRAVENOUS | Status: AC
  Administered 2016-01-07: 10:00:00 via INTRAVENOUS

## 2016-01-07 MED ORDER — SODIUM CHLORIDE 0.9% FLUSH
10.0000 mL | INTRAVENOUS | Status: DC | PRN
  Administered 2016-01-07: 10 mL
  Filled 2016-01-07: qty 10

## 2016-01-07 MED ORDER — PACLITAXEL PROTEIN-BOUND CHEMO INJECTION 100 MG
80.0000 mg/m2 | Freq: Once | INTRAVENOUS | Status: AC
Start: 2016-01-07 — End: 2016-01-07
  Administered 2016-01-07: 150 mg via INTRAVENOUS
  Filled 2016-01-07: qty 30

## 2016-01-07 MED ORDER — PROCHLORPERAZINE MALEATE 10 MG PO TABS: 10.0000 mg | ORAL_TABLET | Freq: Once | ORAL | Status: DC

## 2016-01-07 NOTE — Patient Instructions (Addendum)
[facility] Discharge Instructions for Patients Receiving Chemotherapy  Today you received the following chemotherapy agents Abraxane To help prevent nausea and vomiting after your treatment, we encourage you to take your nausea medication as prescribed.   If you develop nausea and vomiting that is not controlled by your nausea medication, call the clinic.   BELOW ARE SYMPTOMS THAT SHOULD BE REPORTED IMMEDIATELY:  *FEVER GREATER THAN 100.5 F  *CHILLS WITH OR WITHOUT FEVER  NAUSEA AND VOMITING THAT IS NOT CONTROLLED WITH YOUR NAUSEA MEDICATION  *UNUSUAL SHORTNESS OF BREATH  *UNUSUAL BRUISING OR BLEEDING  TENDERNESS IN MOUTH AND THROAT WITH OR WITHOUT PRESENCE OF ULCERS  *URINARY PROBLEMS  *BOWEL PROBLEMS  UNUSUAL RASH Items with * indicate a potential emergency and should be followed up as soon as possible.  Feel free to call the clinic you have any questions or concerns. The clinic phone number is [phone].  Please show the CHEMO ALERT CARD at check-in to the Emergency Department and triage nurse.   

## 2016-01-13 ENCOUNTER — Telehealth: Payer: Self-pay | Admitting: *Deleted

## 2016-01-13 ENCOUNTER — Ambulatory Visit: Payer: 59 | Attending: Plastic Surgery | Admitting: Physical Therapy

## 2016-01-13 DIAGNOSIS — M25611 Stiffness of right shoulder, not elsewhere classified: Secondary | ICD-10-CM | POA: Diagnosis not present

## 2016-01-13 DIAGNOSIS — M25612 Stiffness of left shoulder, not elsewhere classified: Secondary | ICD-10-CM | POA: Insufficient documentation

## 2016-01-13 DIAGNOSIS — M6281 Muscle weakness (generalized): Secondary | ICD-10-CM | POA: Insufficient documentation

## 2016-01-14 ENCOUNTER — Other Ambulatory Visit: Payer: Self-pay | Admitting: *Deleted

## 2016-01-14 ENCOUNTER — Ambulatory Visit (HOSPITAL_BASED_OUTPATIENT_CLINIC_OR_DEPARTMENT_OTHER): Payer: 59

## 2016-01-14 ENCOUNTER — Other Ambulatory Visit (HOSPITAL_BASED_OUTPATIENT_CLINIC_OR_DEPARTMENT_OTHER): Payer: 59

## 2016-01-14 VITALS — BP 107/79 | HR 92 | Temp 99.1°F | Resp 16

## 2016-01-14 DIAGNOSIS — C50411 Malignant neoplasm of upper-outer quadrant of right female breast: Secondary | ICD-10-CM

## 2016-01-14 DIAGNOSIS — Z170 Estrogen receptor positive status [ER+]: Principal | ICD-10-CM

## 2016-01-14 DIAGNOSIS — Z5111 Encounter for antineoplastic chemotherapy: Secondary | ICD-10-CM | POA: Diagnosis not present

## 2016-01-14 LAB — COMPREHENSIVE METABOLIC PANEL
ALT: 30 U/L (ref 0–55)
ANION GAP: 8 meq/L (ref 3–11)
AST: 26 U/L (ref 5–34)
Albumin: 3.6 g/dL (ref 3.5–5.0)
Alkaline Phosphatase: 77 U/L (ref 40–150)
BUN: 9 mg/dL (ref 7.0–26.0)
CHLORIDE: 109 meq/L (ref 98–109)
CO2: 26 meq/L (ref 22–29)
CREATININE: 0.7 mg/dL (ref 0.6–1.1)
Calcium: 9.1 mg/dL (ref 8.4–10.4)
EGFR: >90 mL/min/{1.73_m2} (ref >90)
Glucose: 84 mg/dl (ref 70–140)
Potassium: 3.9 mEq/L (ref 3.5–5.1)
Sodium: 142 mEq/L (ref 136–145)
Total Bilirubin: 0.6 mg/dL (ref 0.20–1.20)
Total Protein: 6.2 g/dL — ABNORMAL LOW (ref 6.4–8.3)

## 2016-01-14 LAB — CBC WITH DIFFERENTIAL/PLATELET
BASO%: 2.9 % — AB (ref 0.0–2.0)
Basophils Absolute: 0.1 10*3/uL (ref 0.0–0.1)
EOS%: 2.6 % (ref 0.0–7.0)
Eosinophils Absolute: 0.1 10*3/uL (ref 0.0–0.5)
HCT: 24.3 % — ABNORMAL LOW (ref 34.8–46.6)
HGB: 8.3 g/dL — ABNORMAL LOW (ref 11.6–15.9)
LYMPH%: 16.8 % (ref 14.0–49.7)
MCH: 32.4 pg (ref 25.1–34.0)
MCHC: 34.2 g/dL (ref 31.5–36.0)
MCV: 94.8 fL (ref 79.5–101.0)
MONO#: 0.2 10*3/uL (ref 0.1–0.9)
MONO%: 8.9 % (ref 0.0–14.0)
NEUT#: 1.5 10*3/uL (ref 1.5–6.5)
NEUT%: 68.8 % (ref 38.4–76.8)
Platelets: 197 10*3/uL (ref 145–400)
RBC: 2.56 10*6/uL — AB (ref 3.70–5.45)
RDW: 20.7 % — ABNORMAL HIGH (ref 11.2–14.5)
WBC: 2.2 10*3/uL — ABNORMAL LOW (ref 3.9–10.3)
lymph#: 0.4 10*3/uL — ABNORMAL LOW (ref 0.9–3.3)

## 2016-01-14 MED ORDER — SODIUM CHLORIDE 0.9 % IV SOLN
Freq: Once | INTRAVENOUS | Status: AC
  Administered 2016-01-14: 10:00:00 via INTRAVENOUS

## 2016-01-14 MED ORDER — SODIUM CHLORIDE 0.9% FLUSH
10.0000 mL | INTRAVENOUS | Status: DC | PRN
  Administered 2016-01-14: 10 mL
  Filled 2016-01-14: qty 10

## 2016-01-14 MED ORDER — PROCHLORPERAZINE MALEATE 10 MG PO TABS
ORAL_TABLET | ORAL | Status: AC
  Filled 2016-01-14: qty 1

## 2016-01-14 MED ORDER — HEPARIN SOD (PORK) LOCK FLUSH 100 UNIT/ML IV SOLN
500.0000 [IU] | Freq: Once | INTRAVENOUS | Status: AC | PRN
  Administered 2016-01-14: 500 [IU]
  Filled 2016-01-14: qty 5

## 2016-01-14 MED ORDER — PACLITAXEL PROTEIN-BOUND CHEMO INJECTION 100 MG
80.0000 mg/m2 | Freq: Once | INTRAVENOUS | Status: AC
  Administered 2016-01-14: 150 mg via INTRAVENOUS
  Filled 2016-01-14: qty 30

## 2016-01-14 MED ORDER — PROCHLORPERAZINE MALEATE 10 MG PO TABS
10.0000 mg | ORAL_TABLET | Freq: Once | ORAL | Status: AC
  Administered 2016-01-14: 10 mg via ORAL

## 2016-01-14 NOTE — Therapy (Addendum)
[place], [place], [address] Phone: [phone]   Fax:  [phone]  Physical Therapy Treatment  Patient Details  Name: Melanie Barber MRN: [id number] Date of Birth: November 01, 1965 Referring Provider: Thimmappa   Encounter Date: 01/13/2016      PT End of Session - 01/14/16 6962    Visit Number 8   Number of Visits 13   Date for PT Re-Evaluation 04/08/15   PT Start Time 9528   PT Stop Time 1425   PT Time Calculation (min) 40 min   Activity Tolerance Patient tolerated treatment well   Behavior During Therapy [facility] for tasks assessed/performed      Past Medical History:  Diagnosis Date  . Arthritis    right knee (10/01/2015)  . Cancer of right breast ([place])   . Migraine    "once when I was going thru a divorce; called them cluster" (10/01/2015)  . PONV (postoperative nausea and vomiting)   . Sciatic leg pain    left leg    Past Surgical History:  Procedure Laterality Date  . BREAST BIOPSY Right   . BREAST CYST ASPIRATION Right   . BREAST RECONSTRUCTION WITH PLACEMENT OF TISSUE EXPANDER AND FLEX HD (ACELLULAR HYDRATED DERMIS) Bilateral 10/01/2015  . BREAST RECONSTRUCTION WITH PLACEMENT OF TISSUE EXPANDER AND FLEX HD (ACELLULAR HYDRATED DERMIS) Bilateral 10/01/2015   Procedure: BREAST RECONSTRUCTION WITH PLACEMENT OF TISSUE EXPANDER AND CORTIVA;  Surgeon: Irene Limbo, MD;  Location: [address];  Service: Plastics;  Laterality: Bilateral;  . [place]   growth removed  . MASTECTOMY Left 10/01/2015   NIPPLE SPARING  . MASTECTOMY COMPLETE / SIMPLE W/ SENTINEL NODE BIOPSY Right 10/01/2015   NIPPLE SPARING  . NASAL/SINUS ENDOSCOPY Bilateral 2005  . NIPPLE SPARING MASTECTOMY/SENTINAL LYMPH NODE BIOPSY/RECONSTRUCTION/PLACEMENT OF TISSUE EXPANDER Bilateral 10/01/2015   Procedure: BILATERAL NIPPLE SPARING MASTECTOMY WITH RIGHT SENTINAL LYMPH NODE BIOPSY;  Surgeon: Rolm Bookbinder, MD;  Location:  [place];  Service: General;  Laterality: Bilateral;  . PORTACATH PLACEMENT Right 10/01/2015  . PORTACATH PLACEMENT Right 10/01/2015   Procedure: INSERTION PORT-A-CATH;  Surgeon: Rolm Bookbinder, MD;  Location: [place];  Service: General;  Laterality: Right;  . TOE SURGERY Left 1990   "bone spur on great toe"  . TONSILLECTOMY  1977  . ULTRASOUND GUIDANCE FOR VASCULAR ACCESS Right 10/01/2015   Procedure: ULTRASOUND GUIDANCE;  Surgeon: Rolm Bookbinder, MD;  Location: [place];  Service: General;  Laterality: Right;  . WISDOM TOOTH EXTRACTION      There were no vitals filed for this visit.      Subjective Assessment - 01/13/16 1348    Subjective Pt had a rough time with last chemo and is now on a different drug that she hopes will be better.  Pt has had decreased hemoglobin, but continues to be exercising through it.  She is doing more now that she feels better and is doing weights for strengthening as well as walking for cardio work.    Pertinent History Right mastectomy: IDC grade 2, multifocal largest 2.5 cm, with high-grade DCIS, broadly present at  anterior margin and inferior margin 2/5 LN positive, LVI Present,  Left mastectomy: ALH, ER 95%, PR 5-65%, HER-2 negative, Ki-67 10-20%, T2 N1 a stage IIB, patient to begin chemotherapy 11/05/15 then radiation once chemo is completed in approx 5 months   Patient Stated Goals improve ROM   Currently in Pain? Yes  I'm sore from exercises    Pain Score 5  Pain Location --  diffuse at legs and chest from exercise   Pain Descriptors / Indicators Sore            [facility] PT Assessment - 01/14/16 0001      Assessment   Medical Diagnosis right breast cancer   Referring Provider Thimmappa    Onset Date/Surgical Date 10/01/15   Hand Dominance Right     AROM   Right Shoulder Flexion 165 Degrees   Right Shoulder ABduction 165 Degrees   Left Shoulder Flexion 165 Degrees   Left Shoulder ABduction 170 Degrees                      [facility] Adult PT Treatment/Exercise - 01/14/16 0001      Self-Care   Self-Care Other Self-Care Comments   Other Self-Care Comments  dicussed at length pt exercise routines, reinforced slow progression and suggested more attention to core strengthening      Lumbar Exercises: Supine   Other Supine Lumbar Exercises on foam roller, unilateral, then bilateral arm elevation and circles in opposite direction with core engaged    Other Supine Lumbar Exercises on foam roller, unilateral knee to chest with core engaged, the opposite arm and leg raise      Shoulder Exercises: Standing   Other Standing Exercises reviewed standing theraband exercises                 PT Education - 01/14/16 0837    Education provided Yes   Education Details core strengthening on  a foam roller    Person(s) Educated Patient   Methods Explanation;Demonstration   Comprehension Verbalized understanding;Returned demonstration                [facility] Goals - 01/14/16 0845      CC Long Term Goal  #1   Title Patient to independently verbalize lymphedema risk reduction practices   Status Achieved     CC Long Term Goal  #2   Title Patient to be independent in a home exercise program for strengthening and stretching   Baseline needs occasional upgrade    Time 12   Period Weeks   Status On-going     CC Long Term Goal  #3   Title Patient to demonstrate 170 degrees of right shoulder flexion to allow her to reach items overhead   Baseline 126, 160 degrees 11//14/2017, 165 on 12/02/2015, 165 on 01/13/2016   Status Partially Met     CC Long Term Goal  #4   Title Patient to demonstrate 165 degrees of right shoulder abduction to allow her to reach items out to sides   Baseline 100 at eval, 163 degees on 11/18/2015, 165 on 12/02/2015; 162 on 12/09/15, 170 on 01/13/2016    Status Achieved     CC Long Term Goal  #5   Title Patient to demonstrate 65 degrees of right shoulder internal rotation to allow  her to return to prior level of function   Status Achieved            Plan - 01/14/16 0838    Clinical Impression Statement Pt continues to exercise through her treatment and appears to be making good decisions to try to keep walking as she can and to add strengthening when she feels up to it.  She recognized she still has long course in her cancer treatment and wants to use exercise to keep her body strong and preserve cardiac function.  She wants to  keep checking in with PT periodically as she progresses to make sure she is doing what she should and for more ideas to upgrade her program.. so will send renewal for 12 weeks planning to only see pt once every 4-6 weeks    Rehab Potential Good   Clinical Impairments Affecting Rehab Potential in chemo   PT Frequency 1x / week   PT Duration --  4-6 weeks    PT Treatment/Interventions Passive range of motion;Taping;ADLs/Self Care Home Management;Therapeutic exercise;Patient/family education;Manual lymph drainage;Manual techniques;Orthotic Fit/Training   PT Next Visit Plan reassess    Consulted and Agree with Plan of Care Patient      Patient will benefit from skilled therapeutic intervention in order to improve the following deficits and impairments:  Increased edema, Decreased knowledge of precautions, Decreased range of motion, Decreased strength, Impaired UE functional use, Pain  Visit Diagnosis: Stiffness of right shoulder, not elsewhere classified - Plan: PT plan of care cert/re-cert  Stiffness of left shoulder, not elsewhere classified - Plan: PT plan of care cert/re-cert  Muscle weakness (generalized) - Plan: PT plan of care cert/re-cert     Problem List Patient Active Problem List   Diagnosis Date Noted  . Genetic testing 11/17/2015  . Malignant neoplasm of upper-outer quadrant of right breast in female, estrogen receptor positive ([place]) 10/09/2015  . Breast cancer of upper-outer quadrant of right female breast ([place])  09/12/2015   Donato Heinz. Owens Shark PT  Norwood Levo 01/14/2016, 8:54 AM  [place], [place], [address] Phone: [phone]   Fax:  [phone]  Name: Melanie Barber MRN: [id number] Date of Birth: 06/06/1965   PHYSICAL THERAPY DISCHARGE SUMMARY  Visits from Start of Care: 8  Current functional level related to goals / functional outcomes: unknown   Remaining deficits: unknown   Education / Equipment: As above  Plan: Patient agrees to discharge.  Patient goals were partially met. Patient is being discharged due to not returning since the last visit.  ?????    Maudry Diego, PT 06/01/17 10:41 AM

## 2016-01-14 NOTE — Patient Instructions (Signed)
[facility] Discharge Instructions for Patients Receiving Chemotherapy  Today you received the following chemotherapy agents: Abraxane   To help prevent nausea and vomiting after your treatment, we encourage you to take your nausea medication as directed.    If you develop nausea and vomiting that is not controlled by your nausea medication, call the clinic.   BELOW ARE SYMPTOMS THAT SHOULD BE REPORTED IMMEDIATELY:  *FEVER GREATER THAN 100.5 F  *CHILLS WITH OR WITHOUT FEVER  NAUSEA AND VOMITING THAT IS NOT CONTROLLED WITH YOUR NAUSEA MEDICATION  *UNUSUAL SHORTNESS OF BREATH  *UNUSUAL BRUISING OR BLEEDING  TENDERNESS IN MOUTH AND THROAT WITH OR WITHOUT PRESENCE OF ULCERS  *URINARY PROBLEMS  *BOWEL PROBLEMS  UNUSUAL RASH Items with * indicate a potential emergency and should be followed up as soon as possible.  Feel free to call the clinic you have any questions or concerns. The clinic phone number is [phone].  Please show the CHEMO ALERT CARD at check-in to the Emergency Department and triage nurse.   

## 2016-01-15 NOTE — Telephone Encounter (Signed)
Returned call to pt lmovm.

## 2016-01-20 ENCOUNTER — Ambulatory Visit: Payer: 59 | Admitting: Physical Therapy

## 2016-01-20 NOTE — Assessment & Plan Note (Signed)
Bilateral mastectomies 10/01/2015 With reconstruction Right mastectomy: IDC grade 2, multifocal largest 2.5 cm, with high-grade DCIS, broadly present at anterior margin and inferior margin 2/5 LN positive, LVI Present,  Left mastectomy: ALH, ER 95%, PR 5-65%, HER-2 negative, Ki-67 10-20%,  Pathologic stage: T2 N1 a stage IIB Mammaprint: High risk CT-CAP and bone scan: No evidence of metastatic disease ----------------------------------------------------------------------------------------------------------------------------------------------------------------- Treatment plan: 1. Because of lymph node involvement I discussed the risks and benefits of doing axillary lymph node dissection. With the advent of chemotherapy and adjuvant radiation, axillary lymph node dissection has not shown to have substantial benefit. 2. Adjuvant chemotherapy with dose dense Adriamycin and Cytoxan 4 followed by Abraxane weekly 12 ( patient is intolerant to steroids and hence we cannot use paclitaxel) 3. Followed by adjuvant radiation therapy 4. Followed by adjuvant antiestrogen therapy with tamoxifen 10 years  PREVENT trial: [id number]  ------------------------------------------------------------------------------------------------------------------------------- Current treatment: Completed 4 cycles of dose dense Adriamycin Cytoxan. Today is cycle 4 Abraxane  Chemo Toxicities: 1. Intermittent nausea for which she takes Zofran and Compazine around-the-clock 2. constipation: Take stool softeners 3. Alopecia 4. Mouthsore that was treated with acyclovir: Resolved 5. Chemotherapy-induced anemia: Hemoglobin down to 8.6. We are watching and monitoring it. This explains her fatigue and shortness of breath to exertion.  RTC in 2 weeks for cycle 6 Abraxane

## 2016-01-21 ENCOUNTER — Other Ambulatory Visit: Payer: Self-pay | Admitting: Hematology

## 2016-01-21 ENCOUNTER — Ambulatory Visit (HOSPITAL_BASED_OUTPATIENT_CLINIC_OR_DEPARTMENT_OTHER): Payer: 59 | Admitting: Hematology and Oncology

## 2016-01-21 ENCOUNTER — Other Ambulatory Visit (HOSPITAL_BASED_OUTPATIENT_CLINIC_OR_DEPARTMENT_OTHER): Payer: 59

## 2016-01-21 ENCOUNTER — Ambulatory Visit (HOSPITAL_BASED_OUTPATIENT_CLINIC_OR_DEPARTMENT_OTHER): Payer: 59

## 2016-01-21 ENCOUNTER — Other Ambulatory Visit: Payer: Self-pay | Admitting: Hematology and Oncology

## 2016-01-21 ENCOUNTER — Other Ambulatory Visit: Payer: Self-pay | Admitting: Emergency Medicine

## 2016-01-21 ENCOUNTER — Encounter: Payer: Self-pay | Admitting: Hematology and Oncology

## 2016-01-21 DIAGNOSIS — C50411 Malignant neoplasm of upper-outer quadrant of right female breast: Secondary | ICD-10-CM

## 2016-01-21 DIAGNOSIS — Z5111 Encounter for antineoplastic chemotherapy: Secondary | ICD-10-CM | POA: Diagnosis not present

## 2016-01-21 DIAGNOSIS — L658 Other specified nonscarring hair loss: Secondary | ICD-10-CM

## 2016-01-21 DIAGNOSIS — R110 Nausea: Secondary | ICD-10-CM | POA: Diagnosis not present

## 2016-01-21 DIAGNOSIS — K5900 Constipation, unspecified: Secondary | ICD-10-CM

## 2016-01-21 DIAGNOSIS — D701 Agranulocytosis secondary to cancer chemotherapy: Secondary | ICD-10-CM

## 2016-01-21 DIAGNOSIS — Z170 Estrogen receptor positive status [ER+]: Secondary | ICD-10-CM

## 2016-01-21 DIAGNOSIS — D6481 Anemia due to antineoplastic chemotherapy: Secondary | ICD-10-CM

## 2016-01-21 LAB — CBC WITH DIFFERENTIAL/PLATELET
BASO%: 0.9 % (ref 0.0–2.0)
Basophils Absolute: 0 10*3/uL (ref 0.0–0.1)
EOS%: 3.9 % (ref 0.0–7.0)
Eosinophils Absolute: 0.1 10*3/uL (ref 0.0–0.5)
HEMATOCRIT: 24.5 % — AB (ref 34.8–46.6)
HEMOGLOBIN: 8.3 g/dL — AB (ref 11.6–15.9)
LYMPH#: 0.5 10*3/uL — AB (ref 0.9–3.3)
LYMPH%: 23.4 % (ref 14.0–49.7)
MCH: 32.3 pg (ref 25.1–34.0)
MCHC: 33.9 g/dL (ref 31.5–36.0)
MCV: 95.3 fL (ref 79.5–101.0)
MONO#: 0.3 10*3/uL (ref 0.1–0.9)
MONO%: 11.7 % (ref 0.0–14.0)
NEUT%: 60.1 % (ref 38.4–76.8)
NEUTROS ABS: 1.4 10*3/uL — AB (ref 1.5–6.5)
PLATELETS: 177 10*3/uL (ref 145–400)
RBC: 2.57 10*6/uL — ABNORMAL LOW (ref 3.70–5.45)
RDW: 19.1 % — ABNORMAL HIGH (ref 11.2–14.5)
WBC: 2.3 10*3/uL — ABNORMAL LOW (ref 3.9–10.3)

## 2016-01-21 LAB — COMPREHENSIVE METABOLIC PANEL
ALBUMIN: 3.8 g/dL (ref 3.5–5.0)
ALK PHOS: 74 U/L (ref 40–150)
ALT: 37 U/L (ref 0–55)
ANION GAP: 8 meq/L (ref 3–11)
AST: 30 U/L (ref 5–34)
BILIRUBIN TOTAL: 0.7 mg/dL (ref 0.20–1.20)
BUN: 14.1 mg/dL (ref 7.0–26.0)
CALCIUM: 9.2 mg/dL (ref 8.4–10.4)
CO2: 26 mEq/L (ref 22–29)
CREATININE: 0.7 mg/dL (ref 0.6–1.1)
Chloride: 108 mEq/L (ref 98–109)
EGFR: >90 mL/min/{1.73_m2} (ref >90)
Glucose: 91 mg/dl (ref 70–140)
Potassium: 4.1 mEq/L (ref 3.5–5.1)
Sodium: 141 mEq/L (ref 136–145)
TOTAL PROTEIN: 6.2 g/dL — AB (ref 6.4–8.3)

## 2016-01-21 MED ORDER — HEPARIN SOD (PORK) LOCK FLUSH 100 UNIT/ML IV SOLN
500.0000 [IU] | Freq: Once | INTRAVENOUS | Status: AC | PRN
  Administered 2016-01-21: 500 [IU]
  Filled 2016-01-21: qty 5

## 2016-01-21 MED ORDER — SODIUM CHLORIDE 0.9 % IV SOLN
Freq: Once | INTRAVENOUS | Status: AC
  Administered 2016-01-21: 10:00:00 via INTRAVENOUS

## 2016-01-21 MED ORDER — PACLITAXEL PROTEIN-BOUND CHEMO INJECTION 100 MG
65.0000 mg/m2 | Freq: Once | INTRAVENOUS | Status: AC
Start: 2016-01-21 — End: 2016-01-21
  Administered 2016-01-21: 125 mg via INTRAVENOUS
  Filled 2016-01-21: qty 25

## 2016-01-21 MED ORDER — PROCHLORPERAZINE MALEATE 10 MG PO TABS: 10.0000 mg | ORAL_TABLET | Freq: Once | ORAL | Status: DC

## 2016-01-21 MED ORDER — SODIUM CHLORIDE 0.9% FLUSH
10.0000 mL | INTRAVENOUS | Status: DC | PRN
  Administered 2016-01-21: 10 mL
  Filled 2016-01-21: qty 10

## 2016-01-21 NOTE — Patient Instructions (Signed)
[facility] Discharge Instructions for Patients Receiving Chemotherapy  Today you received the following chemotherapy agents: Abraxane   To help prevent nausea and vomiting after your treatment, we encourage you to take your nausea medication as directed.    If you develop nausea and vomiting that is not controlled by your nausea medication, call the clinic.   BELOW ARE SYMPTOMS THAT SHOULD BE REPORTED IMMEDIATELY:  *FEVER GREATER THAN 100.5 F  *CHILLS WITH OR WITHOUT FEVER  NAUSEA AND VOMITING THAT IS NOT CONTROLLED WITH YOUR NAUSEA MEDICATION  *UNUSUAL SHORTNESS OF BREATH  *UNUSUAL BRUISING OR BLEEDING  TENDERNESS IN MOUTH AND THROAT WITH OR WITHOUT PRESENCE OF ULCERS  *URINARY PROBLEMS  *BOWEL PROBLEMS  UNUSUAL RASH Items with * indicate a potential emergency and should be followed up as soon as possible.  Feel free to call the clinic you have any questions or concerns. The clinic phone number is [phone].  Please show the CHEMO ALERT CARD at check-in to the Emergency Department and triage nurse.   

## 2016-01-21 NOTE — Progress Notes (Signed)
Patient Care Team: No Pcp Per Patient as PCP - General (General Practice)  DIAGNOSIS:  Encounter Diagnosis  Name Primary?  . Malignant neoplasm of upper-outer quadrant of right breast in female, estrogen receptor positive ([place])     SUMMARY OF ONCOLOGIC HISTORY:   Breast cancer of upper-outer quadrant of right female breast ([place])   09/02/2015 Mammogram    Right breast UOQ: 2 masses 3 cm apart, U/S they measured 3.4 cm, larger mass at 1.6 cm, in addition, 1 cm mass at 9:00 and 0.9 cm mass at 8:30( could be intramammary LN); right axillary LN 11 mm; microcalcs 10 cm span       09/09/2015 Initial Diagnosis    Right breast biopsy OUQ: DCIS with calcs and necrosis ER 95%, PR 80%; 9:30 position 5cmfn: IDC with DCIS ER 95%, PR 65%, Ki-67 20%,; right biopsy 8:30 position 3cmfn ER 95%, PR 5%, Ki-67 10%: intramammary LN with IDC      09/12/2015 Breast MRI    Multicentric right breast cancer cluster of enhancing masses 4.1 x 2.2 x 2.6 cm, LOQ: 1.1 cm enhancing mass previously biopsied, subcutaneous low right breast 2 cm mass not biopsied, multiple other small enhancing masses, 1 cm right axillary lymph node      10/01/2015 Surgery    Right mastectomy: IDC grade 2, multifocal largest 2.5 cm, with high-grade DCIS, broadly present at  anterior margin and inferior margin 2/5 LN positive, LVI Present,  Left mastectomy: ALH, ER 95%, PR 5-65%, HER-2 negative, Ki-67 10-20%, T2 N1 a stage IIB      11/05/2015 -  Chemotherapy    Adjuvant chemotherapy with dose dense Adriamycin and Cytoxan 4 followed by Abraxane weekly 12       CHIEF COMPLIANT: cycle 4 Abraxane  INTERVAL HISTORY: Melanie Barber is a 51 year old with above-mentioned history of right breast cancer treated with mastectomy and is currently on adjuvant chemotherapy. Today is cycle 4 of Abraxane. She has been anemic and we're watching her counts closely. She denies any nausea or vomiting today. Constipation has improved.  REVIEW OF  SYSTEMS:   Constitutional: Denies fevers, chills or abnormal weight loss Eyes: Denies blurriness of vision Ears, nose, mouth, throat, and face: Denies mucositis or sore throat Respiratory: Denies cough, dyspnea or wheezes Cardiovascular: Denies palpitation, chest discomfort Gastrointestinal:  Denies nausea, heartburn or change in bowel habits Skin: Denies abnormal skin rashes Lymphatics: Denies new lymphadenopathy or easy bruising Neurological:Denies numbness, tingling or new weaknesses Behavioral/Psych: Mood is stable, no new changes  Extremities: No lower extremity edema Breast:  denies any pain or lumps or nodules in either breasts All other systems were reviewed with the patient and are negative.  I have reviewed the past medical history, past surgical history, social history and family history with the patient and they are unchanged from previous note.  ALLERGIES:  is allergic to keflex [cephalexin] and decadron [dexamethasone].  MEDICATIONS:  Current Outpatient Prescriptions  Medication Sig Dispense Refill  . acetaminophen (TYLENOL) 500 MG tablet Take 1,000 mg by mouth.    . Atorvastatin Calcium (INVESTIGATIONAL ATORVASTATIN/PLACEBO) 40 MG tablet [facility] [phone] Take 1 tablet by mouth daily. Take 2 doses (these doses must be 12 hours apart) prior to first chemotherapy treatment. Then take 1 tablet daily by mouth with or without food. 180 tablet 0  . ibuprofen (ADVIL,MOTRIN) 200 MG tablet Take 400 mg by mouth every 6 (six) hours as needed for mild pain.    Marland Kitchen levonorgestrel (MIRENA) 20 MCG/24HR IUD  1 each by Intrauterine route once.    Marland Kitchen LORazepam (ATIVAN) 0.5 MG tablet Take by mouth.    . Multiple Vitamin (MULTIVITAMIN WITH MINERALS) TABS tablet Take 1 tablet by mouth daily.    . ondansetron (ZOFRAN) 8 MG tablet Take by mouth.    . oxyCODONE (OXY IR/ROXICODONE) 5 MG immediate release tablet Take by mouth.    . prochlorperazine (COMPAZINE) 10 MG tablet Take by mouth.    .  promethazine (PHENERGAN) 25 MG tablet Take 1 tablet (25 mg total) by mouth every 6 (six) hours as needed for nausea. 30 tablet 3   No current facility-administered medications for this visit.     PHYSICAL EXAMINATION: ECOG PERFORMANCE STATUS: 1 - Symptomatic but completely ambulatory  Vitals:   01/21/16 0907  BP: (!) 107/55  Pulse: 94  Resp: 17  Temp: 97.8 F (36.6 C)   Filed Weights   01/21/16 0907  Weight: 188 lb 12.8 oz (85.6 kg)    GENERAL:alert, no distress and comfortable SKIN: skin color, texture, turgor are normal, no rashes or significant lesions EYES: normal, Conjunctiva are pink and non-injected, sclera clear OROPHARYNX:no exudate, no erythema and lips, buccal mucosa, and tongue normal  NECK: supple, thyroid normal size, non-tender, without nodularity LYMPH:  no palpable lymphadenopathy in the cervical, axillary or inguinal LUNGS: clear to auscultation and percussion with normal breathing effort HEART: regular rate & rhythm and no murmurs and no lower extremity edema ABDOMEN:abdomen soft, non-tender and normal bowel sounds MUSCULOSKELETAL:no cyanosis of digits and no clubbing  NEURO: alert & oriented x 3 with fluent speech, no focal motor/sensory deficits EXTREMITIES: No lower extremity edema  LABORATORY DATA:  I have reviewed the data as listed   Chemistry      Component Value Date/Time   NA 142 01/14/2016 0919   K 3.9 01/14/2016 0919   CL 105 09/26/2015 1330   CO2 26 01/14/2016 0919   BUN 9.0 01/14/2016 0919   CREATININE 0.7 01/14/2016 0919      Component Value Date/Time   CALCIUM 9.1 01/14/2016 0919   ALKPHOS 77 01/14/2016 0919   AST 26 01/14/2016 0919   ALT 30 01/14/2016 0919   BILITOT 0.60 01/14/2016 0919       Lab Results  Component Value Date   WBC 2.3 (L) 01/21/2016   HGB 8.3 (L) 01/21/2016   HCT 24.5 (L) 01/21/2016   MCV 95.3 01/21/2016   PLT 177 01/21/2016   NEUTROABS 1.4 (L) 01/21/2016    ASSESSMENT & PLAN:  Breast cancer of  upper-outer quadrant of right female breast ([place]) Bilateral mastectomies 10/01/2015 With reconstruction Right mastectomy: IDC grade 2, multifocal largest 2.5 cm, with high-grade DCIS, broadly present at anterior margin and inferior margin 2/5 LN positive, LVI Present,  Left mastectomy: ALH, ER 95%, PR 5-65%, HER-2 negative, Ki-67 10-20%,  Pathologic stage: T2 N1 a stage IIB Mammaprint: High risk CT-CAP and bone scan: No evidence of metastatic disease ----------------------------------------------------------------------------------------------------------------------------------------------------------------- Treatment plan: 1. Because of lymph node involvement I discussed the risks and benefits of doing axillary lymph node dissection. With the advent of chemotherapy and adjuvant radiation, axillary lymph node dissection has not shown to have substantial benefit. 2. Adjuvant chemotherapy with dose dense Adriamycin and Cytoxan 4 followed by Abraxane weekly 12 ( patient is intolerant to steroids and hence we cannot use paclitaxel) 3. Followed by adjuvant radiation therapy 4. Followed by adjuvant antiestrogen therapy with tamoxifen 10 years  PREVENT trial: [id number]  ------------------------------------------------------------------------------------------------------------------------------- Current treatment: Completed 4 cycles of dose  dense Adriamycin Cytoxan. Today is cycle 4 Abraxane  Chemo Toxicities: 1. Intermittent nausea for which she takes Zofran and Compazine around-the-clock 2. constipation: Take stool softeners 3. Alopecia 4. Mouthsore that was treated with acyclovir: Resolved 5. Chemotherapy-induced anemia: Hemoglobin down to 8.6. We are watching and monitoring it. This explains her fatigue and shortness of breath to exertion. 6. Chemotherapy-induced neutropenia: I reduced Abraxane dose with cycle 4 to 65 mg per square  RTC in 2 weeks for cycle 6 Abraxane  I spent  25 minutes talking to the patient of which more than half was spent in counseling and coordination of care.  No orders of the defined types were placed in this encounter.  The patient has a good understanding of the overall plan. she agrees with it. she will call with any problems that may develop before the next visit here.   Rulon Eisenmenger, MD 01/21/16

## 2016-01-23 ENCOUNTER — Encounter: Payer: Self-pay | Admitting: Hematology and Oncology

## 2016-01-23 NOTE — Progress Notes (Signed)
Pt is approved w/ [employer] for Abraxane for the duration of 2018 for $10,000 w/ a 180 day look back period.  Emailed copies of approval letter to Wilhemina Bonito, Belinda Fisher and Elmyra Ricks in billing and gave a copy to HIM to be scan in pt's chart.

## 2016-01-27 ENCOUNTER — Encounter: Payer: 59 | Admitting: Physical Therapy

## 2016-01-28 ENCOUNTER — Other Ambulatory Visit: Payer: Self-pay

## 2016-01-28 ENCOUNTER — Other Ambulatory Visit (HOSPITAL_BASED_OUTPATIENT_CLINIC_OR_DEPARTMENT_OTHER): Payer: 59

## 2016-01-28 ENCOUNTER — Encounter: Payer: Self-pay | Admitting: *Deleted

## 2016-01-28 ENCOUNTER — Ambulatory Visit (HOSPITAL_BASED_OUTPATIENT_CLINIC_OR_DEPARTMENT_OTHER): Payer: 59

## 2016-01-28 ENCOUNTER — Other Ambulatory Visit: Payer: Self-pay | Admitting: Hematology

## 2016-01-28 VITALS — BP 111/68 | HR 89 | Temp 98.5°F | Resp 16

## 2016-01-28 DIAGNOSIS — C50411 Malignant neoplasm of upper-outer quadrant of right female breast: Secondary | ICD-10-CM

## 2016-01-28 DIAGNOSIS — Z170 Estrogen receptor positive status [ER+]: Principal | ICD-10-CM

## 2016-01-28 DIAGNOSIS — Z5111 Encounter for antineoplastic chemotherapy: Secondary | ICD-10-CM | POA: Diagnosis not present

## 2016-01-28 LAB — CBC WITH DIFFERENTIAL/PLATELET
BASO%: 1.6 % (ref 0.0–2.0)
BASOS ABS: 0 10*3/uL (ref 0.0–0.1)
EOS%: 2.3 % (ref 0.0–7.0)
Eosinophils Absolute: 0.1 10*3/uL (ref 0.0–0.5)
HCT: 25.4 % — ABNORMAL LOW (ref 34.8–46.6)
HGB: 8.8 g/dL — ABNORMAL LOW (ref 11.6–15.9)
LYMPH#: 0.4 10*3/uL — AB (ref 0.9–3.3)
LYMPH%: 18 % (ref 14.0–49.7)
MCH: 33.3 pg (ref 25.1–34.0)
MCHC: 34.5 g/dL (ref 31.5–36.0)
MCV: 96.5 fL (ref 79.5–101.0)
MONO#: 0.2 10*3/uL (ref 0.1–0.9)
MONO%: 10.5 % (ref 0.0–14.0)
NEUT#: 1.5 10*3/uL (ref 1.5–6.5)
NEUT%: 67.6 % (ref 38.4–76.8)
Platelets: 172 10*3/uL (ref 145–400)
RBC: 2.63 10*6/uL — AB (ref 3.70–5.45)
RDW: 20.5 % — AB (ref 11.2–14.5)
WBC: 2.3 10*3/uL — ABNORMAL LOW (ref 3.9–10.3)

## 2016-01-28 LAB — COMPREHENSIVE METABOLIC PANEL
ALT: 32 U/L (ref 0–55)
AST: 32 U/L (ref 5–34)
Albumin: 3.9 g/dL (ref 3.5–5.0)
Alkaline Phosphatase: 75 U/L (ref 40–150)
Anion Gap: 9 mEq/L (ref 3–11)
BUN: 13.4 mg/dL (ref 7.0–26.0)
CHLORIDE: 107 meq/L (ref 98–109)
CO2: 25 meq/L (ref 22–29)
Calcium: 9.2 mg/dL (ref 8.4–10.4)
Creatinine: 0.7 mg/dL (ref 0.6–1.1)
GLUCOSE: 88 mg/dL (ref 70–140)
POTASSIUM: 3.8 meq/L (ref 3.5–5.1)
SODIUM: 140 meq/L (ref 136–145)
TOTAL PROTEIN: 6.2 g/dL — AB (ref 6.4–8.3)
Total Bilirubin: 0.87 mg/dL (ref 0.20–1.20)

## 2016-01-28 MED ORDER — PACLITAXEL PROTEIN-BOUND CHEMO INJECTION 100 MG
65.0000 mg/m2 | Freq: Once | INTRAVENOUS | Status: AC
  Administered 2016-01-28: 125 mg via INTRAVENOUS
  Filled 2016-01-28: qty 25

## 2016-01-28 MED ORDER — HEPARIN SOD (PORK) LOCK FLUSH 100 UNIT/ML IV SOLN
500.0000 [IU] | Freq: Once | INTRAVENOUS | Status: AC | PRN
  Administered 2016-01-28: 500 [IU]
  Filled 2016-01-28: qty 5

## 2016-01-28 MED ORDER — SODIUM CHLORIDE 0.9% FLUSH
10.0000 mL | INTRAVENOUS | Status: DC | PRN
  Administered 2016-01-28: 10 mL
  Filled 2016-01-28: qty 10

## 2016-01-28 MED ORDER — PROCHLORPERAZINE MALEATE 10 MG PO TABS: 10.0000 mg | ORAL_TABLET | Freq: Once | ORAL | Status: DC

## 2016-01-28 MED ORDER — SODIUM CHLORIDE 0.9 % IV SOLN
Freq: Once | INTRAVENOUS | Status: AC
  Administered 2016-01-28: 10:00:00 via INTRAVENOUS

## 2016-01-28 NOTE — Patient Instructions (Signed)
[facility] Discharge Instructions for Patients Receiving Chemotherapy  Today you received the following chemotherapy agents Abraxane To help prevent nausea and vomiting after your treatment, we encourage you to take your nausea medication as prescribed.   If you develop nausea and vomiting that is not controlled by your nausea medication, call the clinic.   BELOW ARE SYMPTOMS THAT SHOULD BE REPORTED IMMEDIATELY:  *FEVER GREATER THAN 100.5 F  *CHILLS WITH OR WITHOUT FEVER  NAUSEA AND VOMITING THAT IS NOT CONTROLLED WITH YOUR NAUSEA MEDICATION  *UNUSUAL SHORTNESS OF BREATH  *UNUSUAL BRUISING OR BLEEDING  TENDERNESS IN MOUTH AND THROAT WITH OR WITHOUT PRESENCE OF ULCERS  *URINARY PROBLEMS  *BOWEL PROBLEMS  UNUSUAL RASH Items with * indicate a potential emergency and should be followed up as soon as possible.  Feel free to call the clinic you have any questions or concerns. The clinic phone number is [phone].  Please show the CHEMO ALERT CARD at check-in to the Emergency Department and triage nurse.   

## 2016-02-03 ENCOUNTER — Encounter: Payer: 59 | Admitting: Physical Therapy

## 2016-02-03 NOTE — Assessment & Plan Note (Signed)
Bilateral mastectomies 10/01/2015 With reconstruction Right mastectomy: IDC grade 2, multifocal largest 2.5 cm, with high-grade DCIS, broadly present at anterior margin and inferior margin 2/5 LN positive, LVI Present,  Left mastectomy: ALH, ER 95%, PR 5-65%, HER-2 negative, Ki-67 10-20%,  Pathologic stage: T2 N1 a stage IIB Mammaprint: High risk CT-CAP and bone scan: No evidence of metastatic disease ----------------------------------------------------------------------------------------------------------------------------------------------------------------- Treatment plan: 1. Because of lymph node involvement I discussed the risks and benefits of doing axillary lymph node dissection. With the advent of chemotherapy and adjuvant radiation, axillary lymph node dissection has not shown to have substantial benefit. 2. Adjuvant chemotherapy with dose dense Adriamycin and Cytoxan 4 followed by Abraxane weekly 12 ( patient is intolerant to steroids and hence we cannot use paclitaxel) 3. Followed by adjuvant radiation therapy 4. Followed by adjuvant antiestrogen therapy with tamoxifen 10 years  PREVENT trial: [id number]  ------------------------------------------------------------------------------------------------------------------------------- Current treatment: Completed 4 cycles ofdose dense Adriamycin Cytoxan. Today is cycle 6Abraxane  Chemo Toxicities: 1. Intermittent nausea for which she takes Zofran and Compazine around-the-clock 2. constipation: Take stool softeners 3. Alopecia 4. Mouthsore that was treated with acyclovir: Resolved 5. Chemotherapy-induced anemia: Hemoglobin down to 8.6. We are watching and monitoring it. This explains her fatigue and shortness of breath to exertion. 6. Chemotherapy-induced neutropenia: I reduced Abraxane dose with cycle 4 to 65 mg per square  RTC in 2weeks for cycle 8Abraxane

## 2016-02-04 ENCOUNTER — Encounter: Payer: Self-pay | Admitting: Hematology and Oncology

## 2016-02-04 ENCOUNTER — Ambulatory Visit (HOSPITAL_BASED_OUTPATIENT_CLINIC_OR_DEPARTMENT_OTHER): Payer: 59

## 2016-02-04 ENCOUNTER — Ambulatory Visit (HOSPITAL_BASED_OUTPATIENT_CLINIC_OR_DEPARTMENT_OTHER): Payer: 59 | Admitting: Hematology and Oncology

## 2016-02-04 ENCOUNTER — Other Ambulatory Visit (HOSPITAL_BASED_OUTPATIENT_CLINIC_OR_DEPARTMENT_OTHER): Payer: 59

## 2016-02-04 DIAGNOSIS — Z170 Estrogen receptor positive status [ER+]: Principal | ICD-10-CM

## 2016-02-04 DIAGNOSIS — Z9013 Acquired absence of bilateral breasts and nipples: Secondary | ICD-10-CM | POA: Diagnosis not present

## 2016-02-04 DIAGNOSIS — D6481 Anemia due to antineoplastic chemotherapy: Secondary | ICD-10-CM

## 2016-02-04 DIAGNOSIS — C50411 Malignant neoplasm of upper-outer quadrant of right female breast: Secondary | ICD-10-CM

## 2016-02-04 DIAGNOSIS — Z5111 Encounter for antineoplastic chemotherapy: Secondary | ICD-10-CM

## 2016-02-04 LAB — CBC WITH DIFFERENTIAL/PLATELET
BASO%: 1.7 % (ref 0.0–2.0)
Basophils Absolute: 0 10*3/uL (ref 0.0–0.1)
EOS%: 1.9 % (ref 0.0–7.0)
Eosinophils Absolute: 0 10*3/uL (ref 0.0–0.5)
HCT: 27.7 % — ABNORMAL LOW (ref 34.8–46.6)
HGB: 9.5 g/dL — ABNORMAL LOW (ref 11.6–15.9)
LYMPH%: 16.5 % (ref 14.0–49.7)
MCH: 33.7 pg (ref 25.1–34.0)
MCHC: 34.3 g/dL (ref 31.5–36.0)
MCV: 98.3 fL (ref 79.5–101.0)
MONO#: 0.2 10*3/uL (ref 0.1–0.9)
MONO%: 8.5 % (ref 0.0–14.0)
NEUT#: 1.5 10*3/uL (ref 1.5–6.5)
NEUT%: 71.4 % (ref 38.4–76.8)
Platelets: 170 10*3/uL (ref 145–400)
RBC: 2.81 10*6/uL — AB (ref 3.70–5.45)
RDW: 19 % — ABNORMAL HIGH (ref 11.2–14.5)
WBC: 2.1 10*3/uL — ABNORMAL LOW (ref 3.9–10.3)
lymph#: 0.3 10*3/uL — ABNORMAL LOW (ref 0.9–3.3)

## 2016-02-04 LAB — COMPREHENSIVE METABOLIC PANEL
ALT: 37 U/L (ref 0–55)
ANION GAP: 8 meq/L (ref 3–11)
AST: 34 U/L (ref 5–34)
Albumin: 3.9 g/dL (ref 3.5–5.0)
Alkaline Phosphatase: 71 U/L (ref 40–150)
BUN: 17 mg/dL (ref 7.0–26.0)
CHLORIDE: 107 meq/L (ref 98–109)
CO2: 25 meq/L (ref 22–29)
Calcium: 9.4 mg/dL (ref 8.4–10.4)
Creatinine: 0.7 mg/dL (ref 0.6–1.1)
EGFR: >90 mL/min/{1.73_m2} (ref >90)
Glucose: 92 mg/dl (ref 70–140)
Potassium: 4.2 mEq/L (ref 3.5–5.1)
SODIUM: 140 meq/L (ref 136–145)
Total Bilirubin: 0.73 mg/dL (ref 0.20–1.20)
Total Protein: 6.4 g/dL (ref 6.4–8.3)

## 2016-02-04 MED ORDER — SODIUM CHLORIDE 0.9% FLUSH
10.0000 mL | INTRAVENOUS | Status: DC | PRN
  Administered 2016-02-04: 10 mL
  Filled 2016-02-04: qty 10

## 2016-02-04 MED ORDER — PROCHLORPERAZINE MALEATE 10 MG PO TABS: 10.0000 mg | ORAL_TABLET | Freq: Once | ORAL | Status: DC

## 2016-02-04 MED ORDER — SODIUM CHLORIDE 0.9 % IV SOLN
Freq: Once | INTRAVENOUS | Status: AC
  Administered 2016-02-04: 12:00:00 via INTRAVENOUS

## 2016-02-04 MED ORDER — HEPARIN SOD (PORK) LOCK FLUSH 100 UNIT/ML IV SOLN
500.0000 [IU] | Freq: Once | INTRAVENOUS | Status: AC | PRN
  Administered 2016-02-04: 500 [IU]
  Filled 2016-02-04: qty 5

## 2016-02-04 MED ORDER — PACLITAXEL PROTEIN-BOUND CHEMO INJECTION 100 MG
65.0000 mg/m2 | Freq: Once | INTRAVENOUS | Status: AC
  Administered 2016-02-04: 125 mg via INTRAVENOUS
  Filled 2016-02-04: qty 25

## 2016-02-04 MED ORDER — SODIUM CHLORIDE 0.9 % IV SOLN
Freq: Once | INTRAVENOUS | Status: AC
  Administered 2016-02-04: 10:00:00 via INTRAVENOUS

## 2016-02-04 NOTE — Progress Notes (Signed)
Patient Care Team: No Pcp Per Patient as PCP - General (General Practice)  DIAGNOSIS:  Encounter Diagnosis  Name Primary?  . Malignant neoplasm of upper-outer quadrant of right breast in female, estrogen receptor positive (Melanie Barber)     SUMMARY OF ONCOLOGIC HISTORY:   Breast cancer of upper-outer quadrant of right female breast (Melanie Barber)   09/02/2015 Mammogram    Right breast UOQ: 2 masses 3 cm apart, U/S they measured 3.4 cm, larger mass at 1.6 cm, in addition, 1 cm mass at 9:00 and 0.9 cm mass at 8:30( could be intramammary LN); right axillary LN 11 mm; microcalcs 10 cm span       09/09/2015 Initial Diagnosis    Right breast biopsy OUQ: DCIS with calcs and necrosis ER 95%, PR 80%; 9:30 position 5cmfn: IDC with DCIS ER 95%, PR 65%, Ki-67 20%,; right biopsy 8:30 position 3cmfn ER 95%, PR 5%, Ki-67 10%: intramammary LN with IDC      09/12/2015 Breast MRI    Multicentric right breast cancer cluster of enhancing masses 4.1 x 2.2 x 2.6 cm, LOQ: 1.1 cm enhancing mass previously biopsied, subcutaneous low right breast 2 cm mass not biopsied, multiple other small enhancing masses, 1 cm right axillary lymph node      10/01/2015 Surgery    Right mastectomy: IDC grade 2, multifocal largest 2.5 cm, with high-grade DCIS, broadly present at  anterior margin and inferior margin 2/5 LN positive, LVI Present,  Left mastectomy: ALH, ER 95%, PR 5-65%, HER-2 negative, Ki-67 10-20%, T2 N1 a stage IIB      11/05/2015 -  Chemotherapy    Adjuvant chemotherapy with dose dense Adriamycin and Cytoxan 4 followed by Abraxane weekly 12       CHIEF COMPLIANT: Abraxane cycle 6  INTERVAL HISTORY: Melanie Barber is a 51 year old with above-mentioned history of right breast cancer treated with mastectomy and is here to receive cycle 6 of Abraxane. She is tolerating Abraxane fairly well. She does have fatigue after chemotherapy but she recovers very well from it. She has excellent energy level today. She denies any nausea  vomiting. Denies any neuropathy. She is exercising every day. Lifting weights as well.  REVIEW OF SYSTEMS:   Constitutional: Denies fevers, chills or abnormal weight loss Eyes: Denies blurriness of vision Ears, nose, mouth, throat, and face: Denies mucositis or sore throat Respiratory: Denies cough, dyspnea or wheezes Cardiovascular: Denies palpitation, chest discomfort Gastrointestinal:  Denies nausea, heartburn or change in bowel habits Skin: Denies abnormal skin rashes Lymphatics: Denies new lymphadenopathy or easy bruising Neurological:Denies numbness, tingling or new weaknesses Behavioral/Psych: Mood is stable, no new changes  Extremities: No lower extremity edema Breast:  denies any pain or lumps or nodules in either breasts All other systems were reviewed with the patient and are negative.  I have reviewed the past medical history, past surgical history, social history and family history with the patient and they are unchanged from previous note.  ALLERGIES:  is allergic to keflex [cephalexin] and decadron [dexamethasone].  MEDICATIONS:  Current Outpatient Prescriptions  Medication Sig Dispense Refill  . acetaminophen (TYLENOL) 500 MG tablet Take 1,000 mg by mouth.    . Atorvastatin Calcium (INVESTIGATIONAL ATORVASTATIN/PLACEBO) 40 MG tablet [facility] [phone] Take 1 tablet by mouth daily. Take 2 doses (these doses must be 12 hours apart) prior to first chemotherapy treatment. Then take 1 tablet daily by mouth with or without food. 180 tablet 0  . ibuprofen (ADVIL,MOTRIN) 200 MG tablet Take 400 mg by  mouth every 6 (six) hours as needed for mild pain.    Marland Kitchen levonorgestrel (MIRENA) 20 MCG/24HR IUD 1 each by Intrauterine route once.    Marland Kitchen LORazepam (ATIVAN) 0.5 MG tablet Take by mouth.    . Multiple Vitamin (MULTIVITAMIN WITH MINERALS) TABS tablet Take 1 tablet by mouth daily.    . ondansetron (ZOFRAN) 8 MG tablet Take by mouth.    . oxyCODONE (OXY IR/ROXICODONE) 5 MG immediate  release tablet Take by mouth.    . prochlorperazine (COMPAZINE) 10 MG tablet Take by mouth.    . promethazine (PHENERGAN) 25 MG tablet Take 1 tablet (25 mg total) by mouth every 6 (six) hours as needed for nausea. 30 tablet 3   No current facility-administered medications for this visit.     PHYSICAL EXAMINATION: ECOG PERFORMANCE STATUS: 1 - Symptomatic but completely ambulatory  Vitals:   02/04/16 0833  BP: 119/77  Pulse: 82  Resp: 18  Temp: 97.8 F (36.6 C)   Filed Weights   02/04/16 0833  Weight: 188 lb (85.3 kg)    GENERAL:alert, no distress and comfortable SKIN: skin color, texture, turgor are normal, no rashes or significant lesions EYES: normal, Conjunctiva are pink and non-injected, sclera clear OROPHARYNX:no exudate, no erythema and lips, buccal mucosa, and tongue normal  NECK: supple, thyroid normal size, non-tender, without nodularity LYMPH:  no palpable lymphadenopathy in the cervical, axillary or inguinal LUNGS: clear to auscultation and percussion with normal breathing effort HEART: regular rate & rhythm and no murmurs and no lower extremity edema ABDOMEN:abdomen soft, non-tender and normal bowel sounds MUSCULOSKELETAL:no cyanosis of digits and no clubbing  NEURO: alert & oriented x 3 with fluent speech, no focal motor/sensory deficits EXTREMITIES: No lower extremity edema  LABORATORY DATA:  I have reviewed the data as listed   Chemistry      Component Value Date/Time   NA 140 01/28/2016 0923   K 3.8 01/28/2016 0923   CL 105 09/26/2015 1330   CO2 25 01/28/2016 0923   BUN 13.4 01/28/2016 0923   CREATININE 0.7 01/28/2016 0923      Component Value Date/Time   CALCIUM 9.2 01/28/2016 0923   ALKPHOS 75 01/28/2016 0923   AST 32 01/28/2016 0923   ALT 32 01/28/2016 0923   BILITOT 0.87 01/28/2016 0923       Lab Results  Component Value Date   WBC 2.1 (L) 02/04/2016   HGB 9.5 (L) 02/04/2016   HCT 27.7 (L) 02/04/2016   MCV 98.3 02/04/2016   PLT 170  02/04/2016   NEUTROABS 1.5 02/04/2016    ASSESSMENT & PLAN:  Breast cancer of upper-outer quadrant of right female breast ([facility]) Bilateral mastectomies 10/01/2015 With reconstruction Right mastectomy: IDC grade 2, multifocal largest 2.5 cm, with high-grade DCIS, broadly present at anterior margin and inferior margin 2/5 LN positive, LVI Present,  Left mastectomy: ALH, ER 95%, PR 5-65%, HER-2 negative, Ki-67 10-20%,  Pathologic stage: T2 N1 a stage IIB Mammaprint: High risk CT-CAP and bone scan: No evidence of metastatic disease ----------------------------------------------------------------------------------------------------------------------------------------------------------------- Treatment plan: 1. Because of lymph node involvement I discussed the risks and benefits of doing axillary lymph node dissection. With the advent of chemotherapy and adjuvant radiation, axillary lymph node dissection has not shown to have substantial benefit. 2. Adjuvant chemotherapy with dose dense Adriamycin and Cytoxan 4 followed by Abraxane weekly 12 ( patient is intolerant to steroids and hence we cannot use paclitaxel) 3. Followed by adjuvant radiation therapy 4. Followed by adjuvant antiestrogen therapy with tamoxifen 10  years  PREVENT trial: [id number] [id number]  ----------------------------------------------------------------------------------------------------------------------------------------------------------------- Current treatment: Completed 4 cycles ofdose dense Adriamycin Cytoxan. Today is cycle 6Abraxane  Chemo Toxicities: 1. Intermittent nausea for which she takes Zofran and Compazine around-the-clock 2. constipation: Take stool softeners 3. Alopecia 4. Mouthsore that was treated with acyclovir: Resolved 5. Chemotherapy-induced anemia: Hemoglobin 9.5 today 6. Chemotherapy-induced neutropenia: I reduced Abraxane dose with cycle 4 to 65 mg per square  RTC in 2weeks for cycle  8Abraxane  I spent 25 minutes talking to the patient of which more than half was spent in counseling and coordination of care.  No orders of the defined types were placed in this encounter.  The patient has a good understanding of the overall plan. she agrees with it. she will call with any problems that may develop before the next visit here.   Rulon Eisenmenger, MD 02/04/16

## 2016-02-04 NOTE — Patient Instructions (Signed)
[facility] Discharge Instructions for Patients Receiving Chemotherapy  Today you received the following chemotherapy agents: Abraxane   To help prevent nausea and vomiting after your treatment, we encourage you to take your nausea medication as directed.    If you develop nausea and vomiting that is not controlled by your nausea medication, call the clinic.   BELOW ARE SYMPTOMS THAT SHOULD BE REPORTED IMMEDIATELY:  *FEVER GREATER THAN 100.5 F  *CHILLS WITH OR WITHOUT FEVER  NAUSEA AND VOMITING THAT IS NOT CONTROLLED WITH YOUR NAUSEA MEDICATION  *UNUSUAL SHORTNESS OF BREATH  *UNUSUAL BRUISING OR BLEEDING  TENDERNESS IN MOUTH AND THROAT WITH OR WITHOUT PRESENCE OF ULCERS  *URINARY PROBLEMS  *BOWEL PROBLEMS  UNUSUAL RASH Items with * indicate a potential emergency and should be followed up as soon as possible.  Feel free to call the clinic you have any questions or concerns. The clinic phone number is [phone].  Please show the CHEMO ALERT CARD at check-in to the Emergency Department and triage nurse.   

## 2016-02-09 ENCOUNTER — Telehealth: Payer: Self-pay | Admitting: *Deleted

## 2016-02-09 ENCOUNTER — Encounter: Payer: Self-pay | Admitting: *Deleted

## 2016-02-09 NOTE — Telephone Encounter (Signed)
02/09/16 at 1:33pm - The pt called the research nurse to inform her that her expanders will remain in until November 2018.  The pt said that her chemo will be completed in March 2018.  She said then she will proceed with radiation.  She said that she knew that she will not be able to have her 6 month cardiac MRI in April/May.   The research nurse stated that once her expanders are removed then Dr. Danny Lawless, study chair, will be notified to determine the timing of her cardiac MRI.  The pt verbalized understanding.  The pt said that she has requested an echo to be done after her chemo is completed since her cardiac MRI will be delayed.  The pt was reminded to return her completed January medication diary at her next office visit on Wednesday.  The pt verbalized understanding, and the pt thanked the nurse for her support.   The pt said that she was "just down" about how long her treatment has taken.  The pt said that she is eager to get back to her work and normal life.  The pt also had called Bary Castilla, breast navigator, about her treatment plans.   Brion Aliment RN, BSN, CCRP Clinical Research Nurse 02/09/2016 1:40 PM

## 2016-02-10 ENCOUNTER — Encounter: Payer: Self-pay | Admitting: Radiation Oncology

## 2016-02-11 ENCOUNTER — Ambulatory Visit (HOSPITAL_BASED_OUTPATIENT_CLINIC_OR_DEPARTMENT_OTHER): Payer: 59

## 2016-02-11 ENCOUNTER — Encounter: Payer: Self-pay | Admitting: *Deleted

## 2016-02-11 ENCOUNTER — Other Ambulatory Visit (HOSPITAL_BASED_OUTPATIENT_CLINIC_OR_DEPARTMENT_OTHER): Payer: 59

## 2016-02-11 VITALS — BP 117/72 | HR 90 | Temp 98.2°F | Resp 18

## 2016-02-11 DIAGNOSIS — Z170 Estrogen receptor positive status [ER+]: Principal | ICD-10-CM

## 2016-02-11 DIAGNOSIS — C50411 Malignant neoplasm of upper-outer quadrant of right female breast: Secondary | ICD-10-CM

## 2016-02-11 DIAGNOSIS — Z5111 Encounter for antineoplastic chemotherapy: Secondary | ICD-10-CM

## 2016-02-11 LAB — CBC WITH DIFFERENTIAL/PLATELET
BASO%: 0.7 % (ref 0.0–2.0)
BASOS ABS: 0 10*3/uL (ref 0.0–0.1)
EOS%: 1.8 % (ref 0.0–7.0)
Eosinophils Absolute: 0.1 10*3/uL (ref 0.0–0.5)
HCT: 29 % — ABNORMAL LOW (ref 34.8–46.6)
HGB: 9.3 g/dL — ABNORMAL LOW (ref 11.6–15.9)
LYMPH%: 18.2 % (ref 14.0–49.7)
MCH: 32.4 pg (ref 25.1–34.0)
MCHC: 32.1 g/dL (ref 31.5–36.0)
MCV: 101 fL (ref 79.5–101.0)
MONO#: 0.2 10*3/uL (ref 0.1–0.9)
MONO%: 8 % (ref 0.0–14.0)
NEUT#: 2 10*3/uL (ref 1.5–6.5)
NEUT%: 71.3 % (ref 38.4–76.8)
Platelets: 183 10*3/uL (ref 145–400)
RBC: 2.87 10*6/uL — ABNORMAL LOW (ref 3.70–5.45)
RDW: 16 % — ABNORMAL HIGH (ref 11.2–14.5)
WBC: 2.8 10*3/uL — ABNORMAL LOW (ref 3.9–10.3)
lymph#: 0.5 10*3/uL — ABNORMAL LOW (ref 0.9–3.3)

## 2016-02-11 LAB — COMPREHENSIVE METABOLIC PANEL
ALBUMIN: 4 g/dL (ref 3.5–5.0)
ALK PHOS: 76 U/L (ref 40–150)
ALT: 23 U/L (ref 0–55)
AST: 21 U/L (ref 5–34)
Anion Gap: 8 mEq/L (ref 3–11)
BUN: 12.5 mg/dL (ref 7.0–26.0)
CALCIUM: 9.2 mg/dL (ref 8.4–10.4)
CHLORIDE: 108 meq/L (ref 98–109)
CO2: 24 mEq/L (ref 22–29)
Creatinine: 0.6 mg/dL (ref 0.6–1.1)
EGFR: >90 mL/min/{1.73_m2} (ref >90)
GLUCOSE: 95 mg/dL (ref 70–140)
POTASSIUM: 4.3 meq/L (ref 3.5–5.1)
SODIUM: 141 meq/L (ref 136–145)
Total Bilirubin: 0.66 mg/dL (ref 0.20–1.20)
Total Protein: 6.5 g/dL (ref 6.4–8.3)

## 2016-02-11 MED ORDER — PACLITAXEL PROTEIN-BOUND CHEMO INJECTION 100 MG
65.0000 mg/m2 | Freq: Once | INTRAVENOUS | Status: AC
  Administered 2016-02-11: 125 mg via INTRAVENOUS
  Filled 2016-02-11: qty 25

## 2016-02-11 MED ORDER — SODIUM CHLORIDE 0.9% FLUSH
10.0000 mL | INTRAVENOUS | Status: DC | PRN
  Administered 2016-02-11: 10 mL
  Filled 2016-02-11: qty 10

## 2016-02-11 MED ORDER — PROCHLORPERAZINE MALEATE 10 MG PO TABS
10.0000 mg | ORAL_TABLET | Freq: Once | ORAL | Status: DC
Start: 2016-02-11 — End: 2016-02-11

## 2016-02-11 MED ORDER — SODIUM CHLORIDE 0.9 % IV SOLN
Freq: Once | INTRAVENOUS | Status: AC
  Administered 2016-02-11: 10:00:00 via INTRAVENOUS

## 2016-02-11 MED ORDER — HEPARIN SOD (PORK) LOCK FLUSH 100 UNIT/ML IV SOLN
500.0000 [IU] | Freq: Once | INTRAVENOUS | Status: AC | PRN
  Administered 2016-02-11: 500 [IU]
  Filled 2016-02-11: qty 5

## 2016-02-11 NOTE — Patient Instructions (Signed)
[facility] Discharge Instructions for Patients Receiving Chemotherapy  Today you received the following chemotherapy agents: Abraxane   To help prevent nausea and vomiting after your treatment, we encourage you to take your nausea medication as directed.    If you develop nausea and vomiting that is not controlled by your nausea medication, call the clinic.   BELOW ARE SYMPTOMS THAT SHOULD BE REPORTED IMMEDIATELY:  *FEVER GREATER THAN 100.5 F  *CHILLS WITH OR WITHOUT FEVER  NAUSEA AND VOMITING THAT IS NOT CONTROLLED WITH YOUR NAUSEA MEDICATION  *UNUSUAL SHORTNESS OF BREATH  *UNUSUAL BRUISING OR BLEEDING  TENDERNESS IN MOUTH AND THROAT WITH OR WITHOUT PRESENCE OF ULCERS  *URINARY PROBLEMS  *BOWEL PROBLEMS  UNUSUAL RASH Items with * indicate a potential emergency and should be followed up as soon as possible.  Feel free to call the clinic you have any questions or concerns. The clinic phone number is [phone].  Please show the CHEMO ALERT CARD at check-in to the Emergency Department and triage nurse.   

## 2016-02-13 DIAGNOSIS — G8929 Other chronic pain: Secondary | ICD-10-CM | POA: Diagnosis not present

## 2016-02-13 DIAGNOSIS — M25561 Pain in right knee: Secondary | ICD-10-CM | POA: Diagnosis not present

## 2016-02-17 ENCOUNTER — Ambulatory Visit: Payer: 59 | Admitting: Physical Therapy

## 2016-02-18 ENCOUNTER — Encounter: Payer: Self-pay | Admitting: *Deleted

## 2016-02-18 ENCOUNTER — Other Ambulatory Visit (HOSPITAL_BASED_OUTPATIENT_CLINIC_OR_DEPARTMENT_OTHER): Payer: 59

## 2016-02-18 ENCOUNTER — Encounter: Payer: Self-pay | Admitting: Hematology and Oncology

## 2016-02-18 ENCOUNTER — Ambulatory Visit (HOSPITAL_BASED_OUTPATIENT_CLINIC_OR_DEPARTMENT_OTHER): Payer: 59

## 2016-02-18 ENCOUNTER — Ambulatory Visit (HOSPITAL_BASED_OUTPATIENT_CLINIC_OR_DEPARTMENT_OTHER): Payer: 59 | Admitting: Hematology and Oncology

## 2016-02-18 DIAGNOSIS — Z170 Estrogen receptor positive status [ER+]: Secondary | ICD-10-CM

## 2016-02-18 DIAGNOSIS — D701 Agranulocytosis secondary to cancer chemotherapy: Secondary | ICD-10-CM | POA: Diagnosis not present

## 2016-02-18 DIAGNOSIS — C50411 Malignant neoplasm of upper-outer quadrant of right female breast: Secondary | ICD-10-CM | POA: Diagnosis not present

## 2016-02-18 DIAGNOSIS — D6481 Anemia due to antineoplastic chemotherapy: Secondary | ICD-10-CM

## 2016-02-18 DIAGNOSIS — Z5111 Encounter for antineoplastic chemotherapy: Secondary | ICD-10-CM

## 2016-02-18 DIAGNOSIS — R530 Neoplastic (malignant) related fatigue: Secondary | ICD-10-CM

## 2016-02-18 LAB — COMPREHENSIVE METABOLIC PANEL
ALBUMIN: 4 g/dL (ref 3.5–5.0)
ALK PHOS: 86 U/L (ref 40–150)
ALT: 22 U/L (ref 0–55)
AST: 22 U/L (ref 5–34)
Anion Gap: 9 mEq/L (ref 3–11)
BUN: 17.4 mg/dL (ref 7.0–26.0)
CHLORIDE: 106 meq/L (ref 98–109)
CO2: 26 mEq/L (ref 22–29)
Calcium: 9.4 mg/dL (ref 8.4–10.4)
Creatinine: 0.7 mg/dL (ref 0.6–1.1)
GLUCOSE: 92 mg/dL (ref 70–140)
POTASSIUM: 4.3 meq/L (ref 3.5–5.1)
SODIUM: 140 meq/L (ref 136–145)
Total Bilirubin: 0.61 mg/dL (ref 0.20–1.20)
Total Protein: 6.6 g/dL (ref 6.4–8.3)

## 2016-02-18 LAB — CBC WITH DIFFERENTIAL/PLATELET
BASO%: 0.4 % (ref 0.0–2.0)
Basophils Absolute: 0 10*3/uL (ref 0.0–0.1)
EOS ABS: 0 10*3/uL (ref 0.0–0.5)
EOS%: 1.1 % (ref 0.0–7.0)
HCT: 29.5 % — ABNORMAL LOW (ref 34.8–46.6)
HEMOGLOBIN: 9.7 g/dL — AB (ref 11.6–15.9)
LYMPH%: 17.7 % (ref 14.0–49.7)
MCH: 32.6 pg (ref 25.1–34.0)
MCHC: 32.9 g/dL (ref 31.5–36.0)
MCV: 99 fL (ref 79.5–101.0)
MONO#: 0.3 10*3/uL (ref 0.1–0.9)
MONO%: 9.8 % (ref 0.0–14.0)
NEUT#: 1.9 10*3/uL (ref 1.5–6.5)
NEUT%: 71 % (ref 38.4–76.8)
Platelets: 183 10*3/uL (ref 145–400)
RBC: 2.98 10*6/uL — ABNORMAL LOW (ref 3.70–5.45)
RDW: 15 % — AB (ref 11.2–14.5)
WBC: 2.7 10*3/uL — ABNORMAL LOW (ref 3.9–10.3)
lymph#: 0.5 10*3/uL — ABNORMAL LOW (ref 0.9–3.3)

## 2016-02-18 MED ORDER — PACLITAXEL PROTEIN-BOUND CHEMO INJECTION 100 MG
65.0000 mg/m2 | Freq: Once | INTRAVENOUS | Status: AC
  Administered 2016-02-18: 125 mg via INTRAVENOUS
  Filled 2016-02-18: qty 25

## 2016-02-18 MED ORDER — HEPARIN SOD (PORK) LOCK FLUSH 100 UNIT/ML IV SOLN
500.0000 [IU] | Freq: Once | INTRAVENOUS | Status: AC | PRN
  Administered 2016-02-18: 500 [IU]
  Filled 2016-02-18: qty 5

## 2016-02-18 MED ORDER — SODIUM CHLORIDE 0.9% FLUSH
10.0000 mL | INTRAVENOUS | Status: DC | PRN
  Administered 2016-02-18: 10 mL
  Filled 2016-02-18: qty 10

## 2016-02-18 MED ORDER — PROCHLORPERAZINE MALEATE 10 MG PO TABS: 10.0000 mg | ORAL_TABLET | Freq: Once | ORAL | Status: DC

## 2016-02-18 MED ORDER — SODIUM CHLORIDE 0.9 % IV SOLN
Freq: Once | INTRAVENOUS | Status: AC
  Administered 2016-02-18: 11:00:00 via INTRAVENOUS

## 2016-02-18 NOTE — Patient Instructions (Signed)
[facility] Discharge Instructions for Patients Receiving Chemotherapy  Today you received the following chemotherapy agents: Abraxane   To help prevent nausea and vomiting after your treatment, we encourage you to take your nausea medication as directed.    If you develop nausea and vomiting that is not controlled by your nausea medication, call the clinic.   BELOW ARE SYMPTOMS THAT SHOULD BE REPORTED IMMEDIATELY:  *FEVER GREATER THAN 100.5 F  *CHILLS WITH OR WITHOUT FEVER  NAUSEA AND VOMITING THAT IS NOT CONTROLLED WITH YOUR NAUSEA MEDICATION  *UNUSUAL SHORTNESS OF BREATH  *UNUSUAL BRUISING OR BLEEDING  TENDERNESS IN MOUTH AND THROAT WITH OR WITHOUT PRESENCE OF ULCERS  *URINARY PROBLEMS  *BOWEL PROBLEMS  UNUSUAL RASH Items with * indicate a potential emergency and should be followed up as soon as possible.  Feel free to call the clinic you have any questions or concerns. The clinic phone number is [phone].  Please show the CHEMO ALERT CARD at check-in to the Emergency Department and triage nurse.   

## 2016-02-18 NOTE — Progress Notes (Signed)
Patient Care Team: Provider Default, MD as PCP - General  DIAGNOSIS:  Encounter Diagnosis  Name Primary?  . Malignant neoplasm of upper-outer quadrant of right breast in female, estrogen receptor positive ([place])     SUMMARY OF ONCOLOGIC HISTORY:   Breast cancer of upper-outer quadrant of right female breast ([place])   09/02/2015 Mammogram    Right breast UOQ: 2 masses 3 cm apart, U/S they measured 3.4 cm, larger mass at 1.6 cm, in addition, 1 cm mass at 9:00 and 0.9 cm mass at 8:30( could be intramammary LN); right axillary LN 11 mm; microcalcs 10 cm span       09/09/2015 Initial Diagnosis    Right breast biopsy OUQ: DCIS with calcs and necrosis ER 95%, PR 80%; 9:30 position 5cmfn: IDC with DCIS ER 95%, PR 65%, Ki-67 20%,; right biopsy 8:30 position 3cmfn ER 95%, PR 5%, Ki-67 10%: intramammary LN with IDC      09/12/2015 Breast MRI    Multicentric right breast cancer cluster of enhancing masses 4.1 x 2.2 x 2.6 cm, LOQ: 1.1 cm enhancing mass previously biopsied, subcutaneous low right breast 2 cm mass not biopsied, multiple other small enhancing masses, 1 cm right axillary lymph node      10/01/2015 Surgery    Right mastectomy: IDC grade 2, multifocal largest 2.5 cm, with high-grade DCIS, broadly present at  anterior margin and inferior margin 2/5 LN positive, LVI Present,  Left mastectomy: ALH, ER 95%, PR 5-65%, HER-2 negative, Ki-67 10-20%, T2 N1 a stage IIB      11/05/2015 -  Chemotherapy    Adjuvant chemotherapy with dose dense Adriamycin and Cytoxan 4 followed by Abraxane weekly 12       CHIEF COMPLIANT: Cycle 8 of Abraxane  INTERVAL HISTORY: Melanie Barber is a 18 year with above-mentioned history Of right breast cancer underwent mastectomy and is currently on adjuvant chemotherapy. Today is cycle 8 of Abraxane. She is complaining of profound fatigue especially after working out 4 miles walking and lifting weights. This is bothering her significantly to the point that she is in  tears today.  REVIEW OF SYSTEMS:   Constitutional: Denies fevers, chills or abnormal weight loss Eyes: Denies blurriness of vision Ears, nose, mouth, throat, and face: Denies mucositis or sore throat Respiratory: Denies cough, dyspnea or wheezes Cardiovascular: Denies palpitation, chest discomfort Gastrointestinal:  Denies nausea, heartburn or change in bowel habits Skin: Denies abnormal skin rashes Lymphatics: Denies new lymphadenopathy or easy bruising Neurological:Denies numbness, tingling or new weaknesses Behavioral/Psych: Emotionally drained Extremities: No lower extremity edema Breast:  denies any pain or lumps or nodules in either breasts All other systems were reviewed with the patient and are negative.  I have reviewed the past medical history, past surgical history, social history and family history with the patient and they are unchanged from previous note.  ALLERGIES:  is allergic to keflex [cephalexin] and decadron [dexamethasone].  MEDICATIONS:  Current Outpatient Prescriptions  Medication Sig Dispense Refill  . acetaminophen (TYLENOL) 500 MG tablet Take 1,000 mg by mouth.    . Atorvastatin Calcium (INVESTIGATIONAL ATORVASTATIN/PLACEBO) 40 MG tablet [facility] [phone] Take 1 tablet by mouth daily. Take 2 doses (these doses must be 12 hours apart) prior to first chemotherapy treatment. Then take 1 tablet daily by mouth with or without food. 180 tablet 0  . ibuprofen (ADVIL,MOTRIN) 200 MG tablet Take 400 mg by mouth every 6 (six) hours as needed for mild pain.    Marland Kitchen levonorgestrel (MIRENA)  20 MCG/24HR IUD 1 each by Intrauterine route once.    Marland Kitchen LORazepam (ATIVAN) 0.5 MG tablet Take by mouth.    . Multiple Vitamin (MULTIVITAMIN WITH MINERALS) TABS tablet Take 1 tablet by mouth daily.    . ondansetron (ZOFRAN) 8 MG tablet Take by mouth.    . oxyCODONE (OXY IR/ROXICODONE) 5 MG immediate release tablet Take by mouth.    . prochlorperazine (COMPAZINE) 10 MG tablet Take by  mouth.    . promethazine (PHENERGAN) 25 MG tablet Take 1 tablet (25 mg total) by mouth every 6 (six) hours as needed for nausea. 30 tablet 3   No current facility-administered medications for this visit.    Facility-Administered Medications Ordered in Other Visits  Medication Dose Route Frequency Provider Last Rate Last Dose  . prochlorperazine (COMPAZINE) tablet 10 mg  10 mg Oral Once Serena Croissant, MD      . sodium chloride flush (NS) 0.9 % injection 10 mL  10 mL Intracatheter PRN Serena Croissant, MD   10 mL at 02/18/16 1216    PHYSICAL EXAMINATION: ECOG PERFORMANCE STATUS: 1 - Symptomatic but completely ambulatory  Vitals:   02/18/16 0950  BP: 125/74  Pulse: 85  Resp: 18  Temp: 98 F (36.7 C)   Filed Weights   02/18/16 0950  Weight: 185 lb 9.6 oz (84.2 kg)    GENERAL:alert, no distress and comfortable SKIN: skin color, texture, turgor are normal, no rashes or significant lesions EYES: normal, Conjunctiva are pink and non-injected, sclera clear OROPHARYNX:no exudate, no erythema and lips, buccal mucosa, and tongue normal  NECK: supple, thyroid normal size, non-tender, without nodularity LYMPH:  no palpable lymphadenopathy in the cervical, axillary or inguinal LUNGS: clear to auscultation and percussion with normal breathing effort HEART: regular rate & rhythm and no murmurs and no lower extremity edema ABDOMEN:abdomen soft, non-tender and normal bowel sounds MUSCULOSKELETAL:no cyanosis of digits and no clubbing  NEURO: alert & oriented x 3 with fluent speech, no focal motor/sensory deficits EXTREMITIES: No lower extremity edema   LABORATORY DATA:  I have reviewed the data as listed   Chemistry      Component Value Date/Time   NA 140 02/18/2016 0925   K 4.3 02/18/2016 0925   CL 105 09/26/2015 1330   CO2 26 02/18/2016 0925   BUN 17.4 02/18/2016 0925   CREATININE 0.7 02/18/2016 0925      Component Value Date/Time   CALCIUM 9.4 02/18/2016 0925   ALKPHOS 86 02/18/2016  0925   AST 22 02/18/2016 0925   ALT 22 02/18/2016 0925   BILITOT 0.61 02/18/2016 0925       Lab Results  Component Value Date   WBC 2.7 (L) 02/18/2016   HGB 9.7 (L) 02/18/2016   HCT 29.5 (L) 02/18/2016   MCV 99.0 02/18/2016   PLT 183 02/18/2016   NEUTROABS 1.9 02/18/2016    ASSESSMENT & PLAN:  Breast cancer of upper-outer quadrant of right female breast (H[facility]) Bilateral mastectomies 10/01/2015 With reconstruction Right mastectomy: IDC grade 2, multifocal largest 2.5 cm, with high-grade DCIS, broadly present at anterior margin and inferior margin 2/5 LN positive, LVI Present,  Left mastectomy: ALH, ER 95%, PR 5-65%, HER-2 negative, Ki-67 10-20%,  Pathologic stage: T2 N1 a stage IIB Mammaprint: High risk CT-CAP and bone scan: No evidence of metastatic disease ----------------------------------------------------------------------------------------------------------------------------------------------------------------- Treatment plan: 1. Because of lymph node involvement I discussed the risks and benefits of doing axillary lymph node dissection. With the advent of chemotherapy and adjuvant radiation, axillary lymph node  dissection has not shown to have substantial benefit. 2. Adjuvant chemotherapy with dose dense Adriamycin and Cytoxan 4 followed by Abraxane weekly 12 ( patient is intolerant to steroids and hence we cannot use paclitaxel) 3. Followed by adjuvant radiation therapy 4. Followed by adjuvant antiestrogen therapy with tamoxifen 10 years  PREVENT trial: [id number]  ----------------------------------------------------------------------------------------------------------------------------------------------------------------- Current treatment: Completed 4 cycles ofdose dense Adriamycin Cytoxan. Today is cycle 8Abraxane  Chemo Toxicities: 1. Intermittent nausea for which she takes Zofran and Compazine around-the-clock 2. constipation: Take stool softeners 3.  Alopecia 4. Mouthsore that was treated with acyclovir: Resolved 5. Chemotherapy-induced anemia: Hemoglobin 9.5 today 6. Chemotherapy-induced neutropenia: I reduced Abraxane dose with cycle 4 to 65 mg per square 7. Fatigue due to chemotherapy  RTC in 2weeks for cycle 10Abraxane   I spent 25 minutes talking to the patient of which more than half was spent in counseling and coordination of care.  No orders of the defined types were placed in this encounter.  The patient has a good understanding of the overall plan. she agrees with it. she will call with any problems that may develop before the next visit here.   Rulon Eisenmenger, MD 02/18/16

## 2016-02-18 NOTE — Assessment & Plan Note (Signed)
Bilateral mastectomies 10/01/2015 With reconstruction Right mastectomy: IDC grade 2, multifocal largest 2.5 cm, with high-grade DCIS, broadly present at anterior margin and inferior margin 2/5 LN positive, LVI Present,  Left mastectomy: ALH, ER 95%, PR 5-65%, HER-2 negative, Ki-67 10-20%,  Pathologic stage: T2 N1 a stage IIB Mammaprint: High risk CT-CAP and bone scan: No evidence of metastatic disease ----------------------------------------------------------------------------------------------------------------------------------------------------------------- Treatment plan: 1. Because of lymph node involvement I discussed the risks and benefits of doing axillary lymph node dissection. With the advent of chemotherapy and adjuvant radiation, axillary lymph node dissection has not shown to have substantial benefit. 2. Adjuvant chemotherapy with dose dense Adriamycin and Cytoxan 4 followed by Abraxane weekly 12 ( patient is intolerant to steroids and hence we cannot use paclitaxel) 3. Followed by adjuvant radiation therapy 4. Followed by adjuvant antiestrogen therapy with tamoxifen 10 years  PREVENT trial: [id number]  ----------------------------------------------------------------------------------------------------------------------------------------------------------------- Current treatment: Completed 4 cycles ofdose dense Adriamycin Cytoxan. Today is cycle 8Abraxane  Chemo Toxicities: 1. Intermittent nausea for which she takes Zofran and Compazine around-the-clock 2. constipation: Take stool softeners 3. Alopecia 4. Mouthsore that was treated with acyclovir: Resolved 5. Chemotherapy-induced anemia: Hemoglobin 9.5 today 6. Chemotherapy-induced neutropenia: I reduced Abraxane dose with cycle 4 to 65 mg per square  RTC in 2weeks for cycle 10Abraxane

## 2016-02-24 ENCOUNTER — Other Ambulatory Visit: Payer: Self-pay

## 2016-02-24 DIAGNOSIS — C50411 Malignant neoplasm of upper-outer quadrant of right female breast: Secondary | ICD-10-CM

## 2016-02-24 DIAGNOSIS — Z170 Estrogen receptor positive status [ER+]: Principal | ICD-10-CM

## 2016-02-25 ENCOUNTER — Other Ambulatory Visit (HOSPITAL_BASED_OUTPATIENT_CLINIC_OR_DEPARTMENT_OTHER): Payer: 59

## 2016-02-25 ENCOUNTER — Ambulatory Visit (HOSPITAL_BASED_OUTPATIENT_CLINIC_OR_DEPARTMENT_OTHER): Payer: 59

## 2016-02-25 VITALS — BP 114/65 | HR 84 | Temp 98.2°F | Resp 18

## 2016-02-25 DIAGNOSIS — C50411 Malignant neoplasm of upper-outer quadrant of right female breast: Secondary | ICD-10-CM

## 2016-02-25 DIAGNOSIS — Z170 Estrogen receptor positive status [ER+]: Principal | ICD-10-CM

## 2016-02-25 DIAGNOSIS — Z9013 Acquired absence of bilateral breasts and nipples: Secondary | ICD-10-CM | POA: Diagnosis not present

## 2016-02-25 DIAGNOSIS — Z5111 Encounter for antineoplastic chemotherapy: Secondary | ICD-10-CM | POA: Diagnosis not present

## 2016-02-25 LAB — COMPREHENSIVE METABOLIC PANEL
ALBUMIN: 4 g/dL (ref 3.5–5.0)
ALK PHOS: 83 U/L (ref 40–150)
ALT: 17 U/L (ref 0–55)
AST: 19 U/L (ref 5–34)
Anion Gap: 7 mEq/L (ref 3–11)
BUN: 11.6 mg/dL (ref 7.0–26.0)
CALCIUM: 9.2 mg/dL (ref 8.4–10.4)
CHLORIDE: 107 meq/L (ref 98–109)
CO2: 26 mEq/L (ref 22–29)
Creatinine: 0.7 mg/dL (ref 0.6–1.1)
GLUCOSE: 87 mg/dL (ref 70–140)
POTASSIUM: 4.1 meq/L (ref 3.5–5.1)
SODIUM: 140 meq/L (ref 136–145)
Total Bilirubin: 0.76 mg/dL (ref 0.20–1.20)
Total Protein: 6.7 g/dL (ref 6.4–8.3)

## 2016-02-25 LAB — CBC WITH DIFFERENTIAL/PLATELET
BASO%: 0.9 % (ref 0.0–2.0)
BASOS ABS: 0 10*3/uL (ref 0.0–0.1)
EOS ABS: 0 10*3/uL (ref 0.0–0.5)
EOS%: 1.3 % (ref 0.0–7.0)
HCT: 29.8 % — ABNORMAL LOW (ref 34.8–46.6)
HEMOGLOBIN: 10.1 g/dL — AB (ref 11.6–15.9)
LYMPH%: 23 % (ref 14.0–49.7)
MCH: 33.1 pg (ref 25.1–34.0)
MCHC: 33.9 g/dL (ref 31.5–36.0)
MCV: 97.7 fL (ref 79.5–101.0)
MONO#: 0.2 10*3/uL (ref 0.1–0.9)
MONO%: 8.1 % (ref 0.0–14.0)
NEUT#: 1.6 10*3/uL (ref 1.5–6.5)
NEUT%: 66.7 % (ref 38.4–76.8)
Platelets: 192 10*3/uL (ref 145–400)
RBC: 3.05 10*6/uL — ABNORMAL LOW (ref 3.70–5.45)
RDW: 14.4 % (ref 11.2–14.5)
WBC: 2.4 10*3/uL — ABNORMAL LOW (ref 3.9–10.3)
lymph#: 0.5 10*3/uL — ABNORMAL LOW (ref 0.9–3.3)

## 2016-02-25 MED ORDER — SODIUM CHLORIDE 0.9 % IV SOLN
Freq: Once | INTRAVENOUS | Status: AC
  Administered 2016-02-25: 11:00:00 via INTRAVENOUS

## 2016-02-25 MED ORDER — SODIUM CHLORIDE 0.9% FLUSH
10.0000 mL | INTRAVENOUS | Status: DC | PRN
  Administered 2016-02-25: 10 mL
  Filled 2016-02-25: qty 10

## 2016-02-25 MED ORDER — PROCHLORPERAZINE MALEATE 10 MG PO TABS: 10.0000 mg | ORAL_TABLET | Freq: Once | ORAL | Status: DC

## 2016-02-25 MED ORDER — HEPARIN SOD (PORK) LOCK FLUSH 100 UNIT/ML IV SOLN
500.0000 [IU] | Freq: Once | INTRAVENOUS | Status: AC | PRN
  Administered 2016-02-25: 500 [IU]
  Filled 2016-02-25: qty 5

## 2016-02-25 MED ORDER — PACLITAXEL PROTEIN-BOUND CHEMO INJECTION 100 MG
65.0000 mg/m2 | Freq: Once | INTRAVENOUS | Status: AC
  Administered 2016-02-25: 125 mg via INTRAVENOUS
  Filled 2016-02-25: qty 25

## 2016-02-25 NOTE — Patient Instructions (Signed)
[facility] Discharge Instructions for Patients Receiving Chemotherapy  Today you received the following chemotherapy agents: Abraxane   To help prevent nausea and vomiting after your treatment, we encourage you to take your nausea medication as directed.    If you develop nausea and vomiting that is not controlled by your nausea medication, call the clinic.   BELOW ARE SYMPTOMS THAT SHOULD BE REPORTED IMMEDIATELY:  *FEVER GREATER THAN 100.5 F  *CHILLS WITH OR WITHOUT FEVER  NAUSEA AND VOMITING THAT IS NOT CONTROLLED WITH YOUR NAUSEA MEDICATION  *UNUSUAL SHORTNESS OF BREATH  *UNUSUAL BRUISING OR BLEEDING  TENDERNESS IN MOUTH AND THROAT WITH OR WITHOUT PRESENCE OF ULCERS  *URINARY PROBLEMS  *BOWEL PROBLEMS  UNUSUAL RASH Items with * indicate a potential emergency and should be followed up as soon as possible.  Feel free to call the clinic you have any questions or concerns. The clinic phone number is [phone].  Please show the CHEMO ALERT CARD at check-in to the Emergency Department and triage nurse.   

## 2016-02-26 ENCOUNTER — Encounter (HOSPITAL_COMMUNITY): Payer: Self-pay | Admitting: Internal Medicine

## 2016-02-26 ENCOUNTER — Ambulatory Visit (HOSPITAL_COMMUNITY)
Admission: RE | Admit: 2016-02-26 | Discharge: 2016-02-26 | Disposition: A | Discharge status: Home / Self Care | Payer: 59 | Source: Ambulatory Visit | Attending: Internal Medicine | Admitting: Internal Medicine

## 2016-02-26 ENCOUNTER — Ambulatory Visit (HOSPITAL_BASED_OUTPATIENT_CLINIC_OR_DEPARTMENT_OTHER)
Admission: RE | Admit: 2016-02-26 | Discharge: 2016-02-26 | Disposition: A | Discharge status: Home / Self Care | Payer: 59 | Source: Ambulatory Visit | Attending: Internal Medicine | Admitting: Internal Medicine

## 2016-02-26 VITALS — BP 118/76 | HR 76 | Wt 183.2 lb

## 2016-02-26 DIAGNOSIS — C50411 Malignant neoplasm of upper-outer quadrant of right female breast: Secondary | ICD-10-CM

## 2016-02-26 DIAGNOSIS — Z170 Estrogen receptor positive status [ER+]: Secondary | ICD-10-CM

## 2016-02-26 DIAGNOSIS — Z79899 Other long term (current) drug therapy: Secondary | ICD-10-CM | POA: Insufficient documentation

## 2016-02-26 DIAGNOSIS — Z9221 Personal history of antineoplastic chemotherapy: Secondary | ICD-10-CM | POA: Insufficient documentation

## 2016-02-26 LAB — ECHOCARDIOGRAM COMPLETE
AVLVOTPG: 5 mmHg
CHL CUP DOP CALC LVOT VTI: 23.8 cm
E decel time: 317 msec
EERAT: 6.29
FS: 31 % (ref 28–44)
IV/PV OW: 0.77
LA diam end sys: 38 mm
LA diam index: 1.94 cm/m2
LA vol index: 28.4 mL/m2
LA vol: 55.7 mL
LASIZE: 38 mm
LAVOLA4C: 54 mL
LV PW d: 11.8 mm — AB (ref 0.6–1.1)
LV e' LATERAL: 14.5 cm/s
LVEEAVG: 6.29
LVEEMED: 6.29
LVOT SV: 82 mL
LVOT area: 3.46 cm2
LVOT diameter: 21 mm
LVOT peak vel: 115 cm/s
MV Dec: 317
MVPG: 3 mmHg
MVPKAVEL: 61.6 m/s
MVPKEVEL: 91.2 m/s
RV LATERAL S' VELOCITY: 12 cm/s
RV TAPSE: 24.4 mm
RV sys press: 27 mmHg
Reg peak vel: 246 cm/s
TDI e' lateral: 14.5
TDI e' medial: 9.57
TRMAXVEL: 246 cm/s

## 2016-02-26 NOTE — Patient Instructions (Signed)
Follow up with Echo in 6 Months, we will call you to schedule these.

## 2016-02-26 NOTE — Progress Notes (Signed)
  Echocardiogram 2D Echocardiogram has been performed.  Johny Chess 02/26/2016, 10:05 AM

## 2016-02-26 NOTE — Progress Notes (Signed)
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 Lindi Adie for enrollment into cardio-oncology clinic.     Breast cancer of upper-outer quadrant of right female breast ([place])   09/02/2015 Mammogram    Right breast UOQ: 2 masses 3 cm apart, U/S they measured 3.4 cm, larger mass at 1.6 cm, in addition, 1 cm mass at 9:00 and 0.9 cm mass at 8:30( could be intramammary LN); right axillary LN 11 mm; microcalcs 10 cm span       09/09/2015 Initial Diagnosis    Right breast biopsy OUQ: DCIS with calcs and necrosis ER 95%, PR 80%; 9:30 position 5cmfn: IDC with DCIS ER 95%, PR 65%, Ki-67 20%,; right biopsy 8:30 position 3cmfn ER 95%, PR 5%, Ki-67 10%: intramammary LN with IDC      09/12/2015 Breast MRI    Multicentric right breast cancer cluster of enhancing masses 4.1 x 2.2 x 2.6 cm, LOQ: 1.1 cm enhancing mass previously biopsied, subcutaneous low right breast 2 cm mass not biopsied, multiple other small enhancing masses, 1 cm right axillary lymph node      10/01/2015 Surgery    Right mastectomy: IDC grade 2, multifocal largest 2.5 cm, with high-grade DCIS, broadly present at  anterior margin and inferior margin 2/5 LN positive, LVI Present,  Left mastectomy: ALH, ER 95%, PR 5-65%, HER-2 negative, Ki-67 10-20%, T2 N1 a stage IIB      11/05/2015 -  Chemotherapy    Adjuvant chemotherapy with dose dense Adriamycin and Cytoxan 4 followed by Abraxane weekly 12        Denies any h/o known CV disease. Diagnosed in 9/17 with right breast CA.  Underwent bilateral mastectomies with reconstruction. Has completed adjuvant chemotherapy with dose dense Adriamycin and Cytoxan 4 followed by Abraxane weekly  12 at end of December. Now on abraxane. Will start XRT in March. Exercising vigorously. No HF symptoms. Enrolled in prevent trial (Lipitor to prevent chemo  toxicity)  ECHO 02/26/16: EF 60% lateral s' 12.7 GLS 21.7% (reviewed personally)     Review of Systems: [y] = yes, '[ ]'$  = no   General: Weight gain '[ ]'$ ; Weight loss '[ ]'$ ; Anorexia '[ ]'$ ; Fatigue '[ ]'$ ; Fever '[ ]'$ ; Chills '[ ]'$ ; Weakness '[ ]'$   Cardiac: Chest pain/pressure '[ ]'$ ; Resting SOB '[ ]'$ ; Exertional SOB '[ ]'$ ; Orthopnea '[ ]'$ ; Pedal Edema '[ ]'$ ; Palpitations '[ ]'$ ; Syncope '[ ]'$ ; Presyncope '[ ]'$ ; Paroxysmal nocturnal dyspnea'[ ]'$   Pulmonary: Cough '[ ]'$ ; Wheezing'[ ]'$ ; Hemoptysis'[ ]'$ ; Sputum '[ ]'$ ; Snoring '[ ]'$   GI: Vomiting'[ ]'$ ; Dysphagia'[ ]'$ ; Melena'[ ]'$ ; Hematochezia '[ ]'$ ; Heartburn'[ ]'$ ; Abdominal pain '[ ]'$ ; Constipation '[ ]'$ ; Diarrhea '[ ]'$ ; BRBPR '[ ]'$   GU: Hematuria'[ ]'$ ; Dysuria '[ ]'$ ; Nocturia'[ ]'$   Vascular: Pain in legs with walking '[ ]'$ ; Pain in feet with lying flat '[ ]'$ ; Non-healing sores '[ ]'$ ; Stroke '[ ]'$ ; TIA '[ ]'$ ; Slurred speech '[ ]'$ ;  Neuro: Headaches'[ ]'$ ; Vertigo'[ ]'$ ; Seizures'[ ]'$ ; Paresthesias'[ ]'$ ;Blurred vision '[ ]'$ ; Diplopia '[ ]'$ ; Vision changes '[ ]'$   Ortho/Skin: Arthritis '[ ]'$ ; Joint pain '[ ]'$ ; Muscle pain '[ ]'$ ; Joint swelling '[ ]'$ ; Back Pain '[ ]'$ ; Rash '[ ]'$   Psych: Depression'[ ]'$ ; Anxiety'[ ]'$   Heme: Bleeding problems '[ ]'$ ; Clotting disorders '[ ]'$ ; Anemia '[ ]'$   Endocrine: Diabetes '[ ]'$ ; Thyroid dysfunction'[ ]'$    Past Medical History:  Diagnosis Date  . Arthritis    right knee (10/01/2015)  . Cancer of right breast ([place])   . Migraine    "once when I was going thru a divorce; called them cluster" (10/01/2015)  . PONV (postoperative nausea and vomiting)   . Sciatic leg pain    left leg    Current Outpatient Prescriptions  Medication Sig Dispense Refill  . acetaminophen (TYLENOL) 500 MG tablet Take 1,000 mg by mouth.    . Atorvastatin Calcium (INVESTIGATIONAL ATORVASTATIN/PLACEBO) 40 MG tablet [facility] [phone] Take 1 tablet by mouth daily. Take 2 doses (these doses must be 12 hours apart) prior to first chemotherapy treatment. Then take 1 tablet daily by mouth with or without food. 180 tablet 0  . ibuprofen (ADVIL,MOTRIN) 200 MG  tablet Take 400 mg by mouth every 6 (six) hours as needed for mild pain.    Marland Kitchen levonorgestrel (MIRENA) 20 MCG/24HR IUD 1 each by Intrauterine route once.    . Multiple Vitamin (MULTIVITAMIN WITH MINERALS) TABS tablet Take 1 tablet by mouth daily.    . ondansetron (ZOFRAN) 8 MG tablet Take by mouth.    . prochlorperazine (COMPAZINE) 10 MG tablet Take by mouth.    . promethazine (PHENERGAN) 25 MG tablet Take 1 tablet (25 mg total) by mouth every 6 (six) hours as needed for nausea. 30 tablet 3  . LORazepam (ATIVAN) 0.5 MG tablet Take by mouth.     No current facility-administered medications for this encounter.     Allergies  Allergen Reactions  . Keflex [Cephalexin] Anaphylaxis  . Decadron [Dexamethasone] Anxiety      Social History   Social History  . Marital status: Divorced    Spouse name: N/A  . Number of children: N/A  . Years of education: N/A   Occupational History  . Not on file.   Social History Main Topics  . Smoking status: Never Smoker  . Smokeless tobacco: Never Used  . Alcohol use Yes     Comment: socially  . Drug use: No  . Sexual activity: Yes    Birth control/ protection: IUD   Other Topics Concern  . Not on file   Social History Narrative  . No narrative on file      Family History  Problem Relation Age of Onset  . Depression Mother   . Inflammatory bowel disease Mother   . Alcohol abuse Mother   . Bipolar disorder Mother   . Hypertension Son   . Other Brother     severe intellectual disabilities  . Arthritis Maternal Uncle     hx knee replacement surgeries  . Stroke Maternal Grandmother 86  . Breast cancer Maternal Grandmother     dx before menopause  . Uterine cancer Maternal Grandmother     dx late 44s  . Bipolar disorder Maternal Grandfather   . Stroke Paternal Grandmother 25  . Diabetes Paternal Grandmother     later in life  . Heart attack Paternal Grandfather     Vitals:   02/26/16 0955  BP: 118/76  Pulse: 76  SpO2: 100%    Weight: 183 lb 4 oz (83.1 kg)    PHYSICAL EXAM: General:  Well appearing. No respiratory difficulty HEENT: normal Neck: supple. no JVD. R port-a-cath Carotids 2+ bilat; no bruits. No lymphadenopathy or thryomegaly appreciated. Cor: PMI nondisplaced. Regular rate & rhythm. No rubs, gallops or murmurs. Lungs: clear Abdomen: soft, nontender, nondistended. No hepatosplenomegaly. No bruits or masses. Good bowel sounds. Extremities: no cyanosis,  clubbing, rash, edema Neuro: alert & oriented x 3, cranial nerves grossly intact. moves all 4 extremities w/o difficulty. Affect pleasant.   ASSESSMENT & PLAN: 1. R breast CA (ER/PR+) --has completed neoadjuvant chemo including adriamycin. I reviewed echo personally. EF and Doppler parameters normal. No HF on exam. Will see back in 6 months for repeat echo to ensure stability. I explained incidence of Adriamycin cardiotoxicity in detail include small possibility of very delayed toxicity.   Knight Oelkers,MD 9:57 PM

## 2016-03-03 ENCOUNTER — Encounter: Payer: Self-pay | Admitting: *Deleted

## 2016-03-03 ENCOUNTER — Other Ambulatory Visit (HOSPITAL_BASED_OUTPATIENT_CLINIC_OR_DEPARTMENT_OTHER): Payer: 59

## 2016-03-03 ENCOUNTER — Ambulatory Visit (HOSPITAL_BASED_OUTPATIENT_CLINIC_OR_DEPARTMENT_OTHER): Payer: 59 | Admitting: Hematology and Oncology

## 2016-03-03 ENCOUNTER — Ambulatory Visit (HOSPITAL_BASED_OUTPATIENT_CLINIC_OR_DEPARTMENT_OTHER): Payer: 59

## 2016-03-03 DIAGNOSIS — R530 Neoplastic (malignant) related fatigue: Secondary | ICD-10-CM | POA: Diagnosis not present

## 2016-03-03 DIAGNOSIS — Z5111 Encounter for antineoplastic chemotherapy: Secondary | ICD-10-CM | POA: Diagnosis not present

## 2016-03-03 DIAGNOSIS — Z170 Estrogen receptor positive status [ER+]: Secondary | ICD-10-CM

## 2016-03-03 DIAGNOSIS — C50411 Malignant neoplasm of upper-outer quadrant of right female breast: Secondary | ICD-10-CM | POA: Diagnosis not present

## 2016-03-03 DIAGNOSIS — Z9013 Acquired absence of bilateral breasts and nipples: Secondary | ICD-10-CM

## 2016-03-03 LAB — CBC WITH DIFFERENTIAL/PLATELET
BASO%: 0.4 % (ref 0.0–2.0)
Basophils Absolute: 0 10*3/uL (ref 0.0–0.1)
EOS ABS: 0 10*3/uL (ref 0.0–0.5)
EOS%: 1.1 % (ref 0.0–7.0)
HCT: 31.3 % — ABNORMAL LOW (ref 34.8–46.6)
HGB: 10.5 g/dL — ABNORMAL LOW (ref 11.6–15.9)
LYMPH%: 18.1 % (ref 14.0–49.7)
MCH: 32.9 pg (ref 25.1–34.0)
MCHC: 33.5 g/dL (ref 31.5–36.0)
MCV: 98.1 fL (ref 79.5–101.0)
MONO#: 0.2 10*3/uL (ref 0.1–0.9)
MONO%: 7.8 % (ref 0.0–14.0)
NEUT#: 2 10*3/uL (ref 1.5–6.5)
NEUT%: 72.6 % (ref 38.4–76.8)
PLATELETS: 182 10*3/uL (ref 145–400)
RBC: 3.19 10*6/uL — AB (ref 3.70–5.45)
RDW: 14.2 % (ref 11.2–14.5)
WBC: 2.7 10*3/uL — ABNORMAL LOW (ref 3.9–10.3)
lymph#: 0.5 10*3/uL — ABNORMAL LOW (ref 0.9–3.3)

## 2016-03-03 LAB — COMPREHENSIVE METABOLIC PANEL
ALT: 23 U/L (ref 0–55)
ANION GAP: 7 meq/L (ref 3–11)
AST: 21 U/L (ref 5–34)
Albumin: 4.2 g/dL (ref 3.5–5.0)
Alkaline Phosphatase: 81 U/L (ref 40–150)
BILIRUBIN TOTAL: 0.72 mg/dL (ref 0.20–1.20)
BUN: 14 mg/dL (ref 7.0–26.0)
CHLORIDE: 106 meq/L (ref 98–109)
CO2: 27 meq/L (ref 22–29)
Calcium: 9.5 mg/dL (ref 8.4–10.4)
Creatinine: 0.7 mg/dL (ref 0.6–1.1)
Glucose: 91 mg/dl (ref 70–140)
POTASSIUM: 4.3 meq/L (ref 3.5–5.1)
Sodium: 140 mEq/L (ref 136–145)
TOTAL PROTEIN: 6.8 g/dL (ref 6.4–8.3)

## 2016-03-03 MED ORDER — HEPARIN SOD (PORK) LOCK FLUSH 100 UNIT/ML IV SOLN
500.0000 [IU] | Freq: Once | INTRAVENOUS | Status: AC | PRN
  Administered 2016-03-03: 500 [IU]
  Filled 2016-03-03: qty 5

## 2016-03-03 MED ORDER — PROCHLORPERAZINE MALEATE 10 MG PO TABS: 10.0000 mg | ORAL_TABLET | Freq: Once | ORAL | Status: DC

## 2016-03-03 MED ORDER — SODIUM CHLORIDE 0.9% FLUSH
10.0000 mL | INTRAVENOUS | Status: DC | PRN
  Administered 2016-03-03: 10 mL
  Filled 2016-03-03: qty 10

## 2016-03-03 MED ORDER — PACLITAXEL PROTEIN-BOUND CHEMO INJECTION 100 MG
65.0000 mg/m2 | Freq: Once | INTRAVENOUS | Status: AC
  Administered 2016-03-03: 125 mg via INTRAVENOUS
  Filled 2016-03-03: qty 25

## 2016-03-03 MED ORDER — SODIUM CHLORIDE 0.9 % IV SOLN
Freq: Once | INTRAVENOUS | Status: AC
  Administered 2016-03-03: 10:00:00 via INTRAVENOUS

## 2016-03-03 NOTE — Patient Instructions (Signed)
[facility] Discharge Instructions for Patients Receiving Chemotherapy  Today you received the following chemotherapy agents Abraxane To help prevent nausea and vomiting after your treatment, we encourage you to take your nausea medication as prescribed.   If you develop nausea and vomiting that is not controlled by your nausea medication, call the clinic.   BELOW ARE SYMPTOMS THAT SHOULD BE REPORTED IMMEDIATELY:  *FEVER GREATER THAN 100.5 F  *CHILLS WITH OR WITHOUT FEVER  NAUSEA AND VOMITING THAT IS NOT CONTROLLED WITH YOUR NAUSEA MEDICATION  *UNUSUAL SHORTNESS OF BREATH  *UNUSUAL BRUISING OR BLEEDING  TENDERNESS IN MOUTH AND THROAT WITH OR WITHOUT PRESENCE OF ULCERS  *URINARY PROBLEMS  *BOWEL PROBLEMS  UNUSUAL RASH Items with * indicate a potential emergency and should be followed up as soon as possible.  Feel free to call the clinic you have any questions or concerns. The clinic phone number is [phone].  Please show the CHEMO ALERT CARD at check-in to the Emergency Department and triage nurse.   

## 2016-03-03 NOTE — Progress Notes (Signed)
Patient Care Team: Provider Default, MD as PCP - General  DIAGNOSIS:  Encounter Diagnosis  Name Primary?  . Malignant neoplasm of upper-outer quadrant of right breast in female, estrogen receptor positive ([place])     SUMMARY OF ONCOLOGIC HISTORY:   Breast cancer of upper-outer quadrant of right female breast ([place])   09/02/2015 Mammogram    Right breast UOQ: 2 masses 3 cm apart, U/S they measured 3.4 cm, larger mass at 1.6 cm, in addition, 1 cm mass at 9:00 and 0.9 cm mass at 8:30( could be intramammary LN); right axillary LN 11 mm; microcalcs 10 cm span       09/09/2015 Initial Diagnosis    Right breast biopsy OUQ: DCIS with calcs and necrosis ER 95%, PR 80%; 9:30 position 5cmfn: IDC with DCIS ER 95%, PR 65%, Ki-67 20%,; right biopsy 8:30 position 3cmfn ER 95%, PR 5%, Ki-67 10%: intramammary LN with IDC      09/12/2015 Breast MRI    Multicentric right breast cancer cluster of enhancing masses 4.1 x 2.2 x 2.6 cm, LOQ: 1.1 cm enhancing mass previously biopsied, subcutaneous low right breast 2 cm mass not biopsied, multiple other small enhancing masses, 1 cm right axillary lymph node      10/01/2015 Surgery    Right mastectomy: IDC grade 2, multifocal largest 2.5 cm, with high-grade DCIS, broadly present at  anterior margin and inferior margin 2/5 LN positive, LVI Present,  Left mastectomy: ALH, ER 95%, PR 5-65%, HER-2 negative, Ki-67 10-20%, T2 N1 a stage IIB      11/05/2015 -  Chemotherapy    Adjuvant chemotherapy with dose dense Adriamycin and Cytoxan 4 followed by Abraxane weekly 12       CHIEF COMPLIANT: Cycle 10 Abraxane  INTERVAL HISTORY: Melanie Barber is a 51 year old with above-mentioned history of right breast cancer on her mastectomy and is currently on adjuvant chemotherapy. She is today receiving cycle 10 of Abraxane. Overall she continues to have increased fatigue. This is continued to be her biggest problem. She denies any neuropathy. Denies any nausea vomiting. She is  used to being very active and she is feeling tired doing much less activity than previously. This has been taxing her emotionally.  REVIEW OF SYSTEMS:  Constitutional: Denies fevers, chills or abnormal weight loss Eyes: Denies blurriness of vision Ears, nose, mouth, throat, and face: Denies mucositis or sore throat Respiratory: Denies cough, dyspnea or wheezes Cardiovascular: Denies palpitation, chest discomfort Gastrointestinal:  Denies nausea, heartburn or change in bowel habits Skin: Denies abnormal skin rashes Lymphatics: Denies new lymphadenopathy or easy bruising Neurological:Denies numbness, tingling or new weaknesses Behavioral/Psych: Mood is stable, no new changes  Extremities: No lower extremity edema Breast:  denies any pain or lumps or nodules in either breasts All other systems were reviewed with the patient and are negative.  I have reviewed the past medical history, past surgical history, social history and family history with the patient and they are unchanged from previous note.  ALLERGIES:  is allergic to keflex [cephalexin] and decadron [dexamethasone].  MEDICATIONS:  Current Outpatient Prescriptions  Medication Sig Dispense Refill  . acetaminophen (TYLENOL) 500 MG tablet Take 1,000 mg by mouth.    . Atorvastatin Calcium (INVESTIGATIONAL ATORVASTATIN/PLACEBO) 40 MG tablet [facility] [phone] Take 1 tablet by mouth daily. Take 2 doses (these doses must be 12 hours apart) prior to first chemotherapy treatment. Then take 1 tablet daily by mouth with or without food. 180 tablet 0  . ibuprofen (ADVIL,MOTRIN) 200  MG tablet Take 400 mg by mouth every 6 (six) hours as needed for mild pain.    Marland Kitchen levonorgestrel (MIRENA) 20 MCG/24HR IUD 1 each by Intrauterine route once.    Marland Kitchen LORazepam (ATIVAN) 0.5 MG tablet Take by mouth.    . Multiple Vitamin (MULTIVITAMIN WITH MINERALS) TABS tablet Take 1 tablet by mouth daily.    . ondansetron (ZOFRAN) 8 MG tablet Take by mouth.    .  prochlorperazine (COMPAZINE) 10 MG tablet Take by mouth.    . promethazine (PHENERGAN) 25 MG tablet Take 1 tablet (25 mg total) by mouth every 6 (six) hours as needed for nausea. 30 tablet 3   No current facility-administered medications for this visit.    Facility-Administered Medications Ordered in Other Visits  Medication Dose Route Frequency Provider Last Rate Last Dose  . 0.9 %  sodium chloride infusion   Intravenous Once Nicholas Lose, MD      . heparin lock flush 100 unit/mL  500 Units Intracatheter Once PRN Nicholas Lose, MD      . PACLitaxel-protein bound (ABRAXANE) chemo infusion 125 mg  65 mg/m2 (Treatment Plan Recorded) Intravenous Once Nicholas Lose, MD      . prochlorperazine (COMPAZINE) tablet 10 mg  10 mg Oral Once Nicholas Lose, MD      . sodium chloride flush (NS) 0.9 % injection 10 mL  10 mL Intracatheter PRN Nicholas Lose, MD        PHYSICAL EXAMINATION: ECOG PERFORMANCE STATUS: 1 - Symptomatic but completely ambulatory  Vitals:   03/03/16 0906  BP: 105/68  Pulse: 81  Resp: 18  Temp: 98.1 F (36.7 C)   Filed Weights   03/03/16 0906  Weight: 182 lb 12.8 oz (82.9 kg)    GENERAL:alert, no distress and comfortable SKIN: skin color, texture, turgor are normal, no rashes or significant lesions EYES: normal, Conjunctiva are pink and non-injected, sclera clear OROPHARYNX:no exudate, no erythema and lips, buccal mucosa, and tongue normal  NECK: supple, thyroid normal size, non-tender, without nodularity LYMPH:  no palpable lymphadenopathy in the cervical, axillary or inguinal LUNGS: clear to auscultation and percussion with normal breathing effort HEART: regular rate & rhythm and no murmurs and no lower extremity edema ABDOMEN:abdomen soft, non-tender and normal bowel sounds MUSCULOSKELETAL:no cyanosis of digits and no clubbing  NEURO: alert & oriented x 3 with fluent speech, no focal motor/sensory deficits EXTREMITIES: No lower extremity edema   LABORATORY DATA:  I  have reviewed the data as listed   Chemistry      Component Value Date/Time   NA 140 03/03/2016 0847   K 4.3 03/03/2016 0847   CL 105 09/26/2015 1330   CO2 27 03/03/2016 0847   BUN 14.0 03/03/2016 0847   CREATININE 0.7 03/03/2016 0847      Component Value Date/Time   CALCIUM 9.5 03/03/2016 0847   ALKPHOS 81 03/03/2016 0847   AST 21 03/03/2016 0847   ALT 23 03/03/2016 0847   BILITOT 0.72 03/03/2016 0847       Lab Results  Component Value Date   WBC 2.7 (L) 03/03/2016   HGB 10.5 (L) 03/03/2016   HCT 31.3 (L) 03/03/2016   MCV 98.1 03/03/2016   PLT 182 03/03/2016   NEUTROABS 2.0 03/03/2016    ASSESSMENT & PLAN:  Breast cancer of upper-outer quadrant of right female breast ([place]) Bilateral mastectomies 10/01/2015 With reconstruction Right mastectomy: IDC grade 2, multifocal largest 2.5 cm, with high-grade DCIS, broadly present at anterior margin and inferior margin 2/5 LN  positive, LVI Present,  Left mastectomy: ALH, ER 95%, PR 5-65%, HER-2 negative, Ki-67 10-20%,  Pathologic stage: T2 N1 a stage IIB Mammaprint: High risk CT-CAP and bone scan: No evidence of metastatic disease ----------------------------------------------------------------------------------------------------------------------------------------------------------------- Treatment plan: 1. Because of lymph node involvement I discussed the risks and benefits of doing axillary lymph node dissection. With the advent of chemotherapy and adjuvant radiation, axillary lymph node dissection has not shown to have substantial benefit. 2. Adjuvant chemotherapy with dose dense Adriamycin and Cytoxan 4 followed by Abraxane weekly 12 ( patient is intolerant to steroids and hence we cannot use paclitaxel) 3. Followed by adjuvant radiation therapy 4. Followed by adjuvant antiestrogen therapy with tamoxifen 10 years  PREVENT trial: [id number]    ----------------------------------------------------------------------------------------------------------------------------------------------------------------- Current treatment: Completed 4 cycles ofdose dense Adriamycin Cytoxan. Today is cycle 10Abraxane  Chemo Toxicities: 1. Intermittent nausea for which she takes Zofran and Compazine around-the-clock 2. constipation: Take stool softeners 3. Alopecia 4. Mouthsore that was treated with acyclovir: Resolved 5. Chemotherapy-induced anemia: Hemoglobin 9.5 today 6. Chemotherapy-induced neutropenia: I reduced Abraxane dose with cycle 4 to 65 mg per square 7. Fatigue due to chemotherapy: Patient is having emotional issues that she is not able to be as physically active as previously. But she still walks 4 miles and works out most days except one day a week when she feels very tired.  Today we briefly talked about the future treatment plan. After the radiation she will likely undergo adjuvant antiestrogen therapy. We may start her on tamoxifen but after she has salpingo-oophorectomy and hysterectomy, we may change her to aromatase inhibitor therapy with letrozole 2.5 mg daily. She would also be eligible to participate in clinical trial.[name]   RTC in 2weeks for cycle 12Abraxane  I spent 25 minutes talking to the patient of which more than half was spent in counseling and coordination of care.  No orders of the defined types were placed in this encounter.  The patient has a good understanding of the overall plan. she agrees with it. she will call with any problems that may develop before the next visit here.   Rulon Eisenmenger, MD 03/03/16

## 2016-03-03 NOTE — Assessment & Plan Note (Signed)
Bilateral mastectomies 10/01/2015 With reconstruction Right mastectomy: IDC grade 2, multifocal largest 2.5 cm, with high-grade DCIS, broadly present at anterior margin and inferior margin 2/5 LN positive, LVI Present,  Left mastectomy: ALH, ER 95%, PR 5-65%, HER-2 negative, Ki-67 10-20%,  Pathologic stage: T2 N1 a stage IIB Mammaprint: High risk CT-CAP and bone scan: No evidence of metastatic disease ----------------------------------------------------------------------------------------------------------------------------------------------------------------- Treatment plan: 1. Because of lymph node involvement I discussed the risks and benefits of doing axillary lymph node dissection. With the advent of chemotherapy and adjuvant radiation, axillary lymph node dissection has not shown to have substantial benefit. 2. Adjuvant chemotherapy with dose dense Adriamycin and Cytoxan 4 followed by Abraxane weekly 12 ( patient is intolerant to steroids and hence we cannot use paclitaxel) 3. Followed by adjuvant radiation therapy 4. Followed by adjuvant antiestrogen therapy with tamoxifen 10 years  PREVENT trial: [id number]  ----------------------------------------------------------------------------------------------------------------------------------------------------------------- Current treatment: Completed 4 cycles ofdose dense Adriamycin Cytoxan. Today is cycle 10Abraxane  Chemo Toxicities: 1. Intermittent nausea for which she takes Zofran and Compazine around-the-clock 2. constipation: Take stool softeners 3. Alopecia 4. Mouthsore that was treated with acyclovir: Resolved 5. Chemotherapy-induced anemia: Hemoglobin 9.5 today 6. Chemotherapy-induced neutropenia: I reduced Abraxane dose with cycle 4 to 65 mg per square 7. Fatigue due to chemotherapy  RTC in 2weeks for cycle 12Abraxane

## 2016-03-10 ENCOUNTER — Encounter: Payer: Self-pay | Admitting: *Deleted

## 2016-03-10 ENCOUNTER — Other Ambulatory Visit (HOSPITAL_BASED_OUTPATIENT_CLINIC_OR_DEPARTMENT_OTHER): Payer: 59

## 2016-03-10 ENCOUNTER — Ambulatory Visit (HOSPITAL_BASED_OUTPATIENT_CLINIC_OR_DEPARTMENT_OTHER): Payer: 59

## 2016-03-10 VITALS — BP 114/77 | HR 84 | Temp 98.2°F | Resp 18

## 2016-03-10 DIAGNOSIS — Z170 Estrogen receptor positive status [ER+]: Principal | ICD-10-CM

## 2016-03-10 DIAGNOSIS — C50411 Malignant neoplasm of upper-outer quadrant of right female breast: Secondary | ICD-10-CM

## 2016-03-10 DIAGNOSIS — Z5111 Encounter for antineoplastic chemotherapy: Secondary | ICD-10-CM

## 2016-03-10 LAB — COMPREHENSIVE METABOLIC PANEL
ALBUMIN: 4.1 g/dL (ref 3.5–5.0)
ALK PHOS: 84 U/L (ref 40–150)
ALT: 20 U/L (ref 0–55)
AST: 18 U/L (ref 5–34)
Anion Gap: 7 mEq/L (ref 3–11)
BUN: 15.8 mg/dL (ref 7.0–26.0)
CO2: 27 meq/L (ref 22–29)
Calcium: 9.2 mg/dL (ref 8.4–10.4)
Chloride: 107 mEq/L (ref 98–109)
Creatinine: 0.7 mg/dL (ref 0.6–1.1)
GLUCOSE: 95 mg/dL (ref 70–140)
POTASSIUM: 4.2 meq/L (ref 3.5–5.1)
SODIUM: 140 meq/L (ref 136–145)
Total Bilirubin: 0.61 mg/dL (ref 0.20–1.20)
Total Protein: 6.6 g/dL (ref 6.4–8.3)

## 2016-03-10 LAB — CBC WITH DIFFERENTIAL/PLATELET
BASO%: 0.6 % (ref 0.0–2.0)
BASOS ABS: 0 10*3/uL (ref 0.0–0.1)
EOS%: 0.9 % (ref 0.0–7.0)
Eosinophils Absolute: 0 10*3/uL (ref 0.0–0.5)
HCT: 31.9 % — ABNORMAL LOW (ref 34.8–46.6)
HEMOGLOBIN: 10.5 g/dL — AB (ref 11.6–15.9)
LYMPH%: 15.1 % (ref 14.0–49.7)
MCH: 32.4 pg (ref 25.1–34.0)
MCHC: 32.9 g/dL (ref 31.5–36.0)
MCV: 98.5 fL (ref 79.5–101.0)
MONO#: 0.2 10*3/uL (ref 0.1–0.9)
MONO%: 6 % (ref 0.0–14.0)
NEUT%: 77.4 % — ABNORMAL HIGH (ref 38.4–76.8)
NEUTROS ABS: 2.7 10*3/uL (ref 1.5–6.5)
Platelets: 169 10*3/uL (ref 145–400)
RBC: 3.24 10*6/uL — AB (ref 3.70–5.45)
RDW: 14.3 % (ref 11.2–14.5)
WBC: 3.5 10*3/uL — AB (ref 3.9–10.3)
lymph#: 0.5 10*3/uL — ABNORMAL LOW (ref 0.9–3.3)

## 2016-03-10 MED ORDER — PROCHLORPERAZINE MALEATE 10 MG PO TABS
ORAL_TABLET | ORAL | Status: AC
  Filled 2016-03-10: qty 1

## 2016-03-10 MED ORDER — PROCHLORPERAZINE MALEATE 10 MG PO TABS: 10.0000 mg | ORAL_TABLET | Freq: Once | ORAL | Status: DC

## 2016-03-10 MED ORDER — SODIUM CHLORIDE 0.9% FLUSH
10.0000 mL | INTRAVENOUS | Status: DC | PRN
  Administered 2016-03-10: 10 mL
  Filled 2016-03-10: qty 10

## 2016-03-10 MED ORDER — HEPARIN SOD (PORK) LOCK FLUSH 100 UNIT/ML IV SOLN
500.0000 [IU] | Freq: Once | INTRAVENOUS | Status: AC | PRN
Start: 2016-03-10 — End: 2016-03-10
  Administered 2016-03-10: 500 [IU]
  Filled 2016-03-10: qty 5

## 2016-03-10 MED ORDER — PACLITAXEL PROTEIN-BOUND CHEMO INJECTION 100 MG
65.0000 mg/m2 | Freq: Once | INTRAVENOUS | Status: AC
Start: 2016-03-10 — End: 2016-03-10
  Administered 2016-03-10: 125 mg via INTRAVENOUS
  Filled 2016-03-10: qty 25

## 2016-03-10 MED ORDER — SODIUM CHLORIDE 0.9 % IV SOLN
Freq: Once | INTRAVENOUS | Status: AC
  Administered 2016-03-10: 10:00:00 via INTRAVENOUS

## 2016-03-10 NOTE — Patient Instructions (Signed)
[facility] Discharge Instructions for Patients Receiving Chemotherapy  Today you received the following chemotherapy agents Abraxane To help prevent nausea and vomiting after your treatment, we encourage you to take your nausea medication as prescribed.   If you develop nausea and vomiting that is not controlled by your nausea medication, call the clinic.   BELOW ARE SYMPTOMS THAT SHOULD BE REPORTED IMMEDIATELY:  *FEVER GREATER THAN 100.5 F  *CHILLS WITH OR WITHOUT FEVER  NAUSEA AND VOMITING THAT IS NOT CONTROLLED WITH YOUR NAUSEA MEDICATION  *UNUSUAL SHORTNESS OF BREATH  *UNUSUAL BRUISING OR BLEEDING  TENDERNESS IN MOUTH AND THROAT WITH OR WITHOUT PRESENCE OF ULCERS  *URINARY PROBLEMS  *BOWEL PROBLEMS  UNUSUAL RASH Items with * indicate a potential emergency and should be followed up as soon as possible.  Feel free to call the clinic you have any questions or concerns. The clinic phone number is [phone].  Please show the CHEMO ALERT CARD at check-in to the Emergency Department and triage nurse.   

## 2016-03-12 DIAGNOSIS — M1711 Unilateral primary osteoarthritis, right knee: Secondary | ICD-10-CM | POA: Diagnosis not present

## 2016-03-17 ENCOUNTER — Encounter: Payer: Self-pay | Admitting: *Deleted

## 2016-03-17 ENCOUNTER — Ambulatory Visit (HOSPITAL_BASED_OUTPATIENT_CLINIC_OR_DEPARTMENT_OTHER): Payer: 59

## 2016-03-17 ENCOUNTER — Ambulatory Visit: Payer: 59

## 2016-03-17 ENCOUNTER — Ambulatory Visit (HOSPITAL_BASED_OUTPATIENT_CLINIC_OR_DEPARTMENT_OTHER): Payer: 59 | Admitting: Hematology and Oncology

## 2016-03-17 ENCOUNTER — Other Ambulatory Visit (HOSPITAL_BASED_OUTPATIENT_CLINIC_OR_DEPARTMENT_OTHER): Payer: 59

## 2016-03-17 ENCOUNTER — Encounter: Payer: Self-pay | Admitting: Hematology and Oncology

## 2016-03-17 DIAGNOSIS — C50411 Malignant neoplasm of upper-outer quadrant of right female breast: Secondary | ICD-10-CM

## 2016-03-17 DIAGNOSIS — R530 Neoplastic (malignant) related fatigue: Secondary | ICD-10-CM

## 2016-03-17 DIAGNOSIS — Z5111 Encounter for antineoplastic chemotherapy: Secondary | ICD-10-CM

## 2016-03-17 DIAGNOSIS — Z9013 Acquired absence of bilateral breasts and nipples: Secondary | ICD-10-CM | POA: Diagnosis not present

## 2016-03-17 DIAGNOSIS — Z170 Estrogen receptor positive status [ER+]: Principal | ICD-10-CM

## 2016-03-17 LAB — COMPREHENSIVE METABOLIC PANEL
ALBUMIN: 4.1 g/dL (ref 3.5–5.0)
ALK PHOS: 97 U/L (ref 40–150)
ALT: 20 U/L (ref 0–55)
AST: 19 U/L (ref 5–34)
Anion Gap: 10 mEq/L (ref 3–11)
BUN: 14.7 mg/dL (ref 7.0–26.0)
CALCIUM: 9.6 mg/dL (ref 8.4–10.4)
CO2: 26 mEq/L (ref 22–29)
CREATININE: 0.7 mg/dL (ref 0.6–1.1)
Chloride: 105 mEq/L (ref 98–109)
EGFR: >90 mL/min/{1.73_m2} (ref >90)
GLUCOSE: 111 mg/dL (ref 70–140)
POTASSIUM: 4.1 meq/L (ref 3.5–5.1)
SODIUM: 141 meq/L (ref 136–145)
Total Bilirubin: 0.87 mg/dL (ref 0.20–1.20)
Total Protein: 6.9 g/dL (ref 6.4–8.3)

## 2016-03-17 LAB — CBC WITH DIFFERENTIAL/PLATELET
BASO%: 0.9 % (ref 0.0–2.0)
Basophils Absolute: 0 10*3/uL (ref 0.0–0.1)
EOS%: 1.2 % (ref 0.0–7.0)
Eosinophils Absolute: 0.1 10*3/uL (ref 0.0–0.5)
HEMATOCRIT: 33.2 % — AB (ref 34.8–46.6)
HEMOGLOBIN: 11.3 g/dL — AB (ref 11.6–15.9)
LYMPH#: 0.6 10*3/uL — AB (ref 0.9–3.3)
LYMPH%: 12.3 % — ABNORMAL LOW (ref 14.0–49.7)
MCH: 33 pg (ref 25.1–34.0)
MCHC: 34.1 g/dL (ref 31.5–36.0)
MCV: 96.7 fL (ref 79.5–101.0)
MONO#: 0.4 10*3/uL (ref 0.1–0.9)
MONO%: 7.8 % (ref 0.0–14.0)
NEUT%: 77.8 % — ABNORMAL HIGH (ref 38.4–76.8)
NEUTROS ABS: 3.5 10*3/uL (ref 1.5–6.5)
Platelets: 181 10*3/uL (ref 145–400)
RBC: 3.44 10*6/uL — ABNORMAL LOW (ref 3.70–5.45)
RDW: 14.9 % — AB (ref 11.2–14.5)
WBC: 4.5 10*3/uL (ref 3.9–10.3)

## 2016-03-17 MED ORDER — PROCHLORPERAZINE MALEATE 10 MG PO TABS: 10.0000 mg | ORAL_TABLET | Freq: Once | ORAL | Status: DC

## 2016-03-17 MED ORDER — SODIUM CHLORIDE 0.9% FLUSH
10.0000 mL | INTRAVENOUS | Status: DC | PRN
  Administered 2016-03-17: 10 mL
  Filled 2016-03-17: qty 10

## 2016-03-17 MED ORDER — SODIUM CHLORIDE 0.9 % IV SOLN
Freq: Once | INTRAVENOUS | Status: AC
  Administered 2016-03-17: 10:00:00 via INTRAVENOUS

## 2016-03-17 MED ORDER — HEPARIN SOD (PORK) LOCK FLUSH 100 UNIT/ML IV SOLN
500.0000 [IU] | Freq: Once | INTRAVENOUS | Status: AC | PRN
  Administered 2016-03-17: 500 [IU]
  Filled 2016-03-17: qty 5

## 2016-03-17 MED ORDER — PACLITAXEL PROTEIN-BOUND CHEMO INJECTION 100 MG
65.0000 mg/m2 | Freq: Once | INTRAVENOUS | Status: AC
  Administered 2016-03-17: 125 mg via INTRAVENOUS
  Filled 2016-03-17: qty 25

## 2016-03-17 NOTE — Progress Notes (Signed)
Patient Care Team: Provider Default, MD as PCP - General  DIAGNOSIS:  Encounter Diagnosis  Name Primary?  . Malignant neoplasm of upper-outer quadrant of right breast in female, estrogen receptor positive ([place])     SUMMARY OF ONCOLOGIC HISTORY:   Breast cancer of upper-outer quadrant of right female breast ([place])   09/02/2015 Mammogram    Right breast UOQ: 2 masses 3 cm apart, U/S they measured 3.4 cm, larger mass at 1.6 cm, in addition, 1 cm mass at 9:00 and 0.9 cm mass at 8:30( could be intramammary LN); right axillary LN 11 mm; microcalcs 10 cm span       09/09/2015 Initial Diagnosis    Right breast biopsy OUQ: DCIS with calcs and necrosis ER 95%, PR 80%; 9:30 position 5cmfn: IDC with DCIS ER 95%, PR 65%, Ki-67 20%,; right biopsy 8:30 position 3cmfn ER 95%, PR 5%, Ki-67 10%: intramammary LN with IDC      09/12/2015 Breast MRI    Multicentric right breast cancer cluster of enhancing masses 4.1 x 2.2 x 2.6 cm, LOQ: 1.1 cm enhancing mass previously biopsied, subcutaneous low right breast 2 cm mass not biopsied, multiple other small enhancing masses, 1 cm right axillary lymph node      10/01/2015 Surgery    Right mastectomy: IDC grade 2, multifocal largest 2.5 cm, with high-grade DCIS, broadly present at  anterior margin and inferior margin 2/5 LN positive, LVI Present,  Left mastectomy: ALH, ER 95%, PR 5-65%, HER-2 negative, Ki-67 10-20%, T2 N1 a stage IIB      11/05/2015 -  Chemotherapy    Adjuvant chemotherapy with dose dense Adriamycin and Cytoxan 4 followed by Abraxane weekly 12       CHIEF COMPLIANT: Cycle 12 Abraxane  INTERVAL HISTORY: Melanie Barber is a 51 year old with above-mentioned history of multicentric right breast cancer underwent mastectomy and is currently on adjuvant chemotherapy. Today is cycle 12 Abraxane. She has tolerated Abraxane fairly well apart from fatigue. She is very excited to be completing chemotherapy today.  REVIEW OF SYSTEMS:   Constitutional:  Denies fevers, chills or abnormal weight loss Eyes: Denies blurriness of vision Ears, nose, mouth, throat, and face: Denies mucositis or sore throat Respiratory: Denies cough, dyspnea or wheezes Cardiovascular: Denies palpitation, chest discomfort Gastrointestinal:  Denies nausea, heartburn or change in bowel habits Skin: Denies abnormal skin rashes Lymphatics: Denies new lymphadenopathy or easy bruising Neurological:Denies numbness, tingling or new weaknesses Behavioral/Psych: Mood is stable, no new changes  Extremities: No lower extremity edema  All other systems were reviewed with the patient and are negative.  I have reviewed the past medical history, past surgical history, social history and family history with the patient and they are unchanged from previous note.  ALLERGIES:  is allergic to keflex [cephalexin] and decadron [dexamethasone].  MEDICATIONS:  Current Outpatient Prescriptions  Medication Sig Dispense Refill  . acetaminophen (TYLENOL) 500 MG tablet Take 1,000 mg by mouth.    . Atorvastatin Calcium (INVESTIGATIONAL ATORVASTATIN/PLACEBO) 40 MG tablet [facility] [phone] Take 1 tablet by mouth daily. Take 2 doses (these doses must be 12 hours apart) prior to first chemotherapy treatment. Then take 1 tablet daily by mouth with or without food. 180 tablet 0  . ibuprofen (ADVIL,MOTRIN) 200 MG tablet Take 400 mg by mouth every 6 (six) hours as needed for mild pain.    Marland Kitchen levonorgestrel (MIRENA) 20 MCG/24HR IUD 1 each by Intrauterine route once.    Marland Kitchen LORazepam (ATIVAN) 0.5 MG tablet Take by  mouth.    . Multiple Vitamin (MULTIVITAMIN WITH MINERALS) TABS tablet Take 1 tablet by mouth daily.    . ondansetron (ZOFRAN) 8 MG tablet Take by mouth.    . prochlorperazine (COMPAZINE) 10 MG tablet Take by mouth.    . promethazine (PHENERGAN) 25 MG tablet Take 1 tablet (25 mg total) by mouth every 6 (six) hours as needed for nausea. 30 tablet 3   No current facility-administered  medications for this visit.    Facility-Administered Medications Ordered in Other Visits  Medication Dose Route Frequency Provider Last Rate Last Dose  . heparin lock flush 100 unit/mL  500 Units Intracatheter Once PRN Nicholas Lose, MD      . PACLitaxel-protein bound (ABRAXANE) chemo infusion 125 mg  65 mg/m2 (Treatment Plan Recorded) Intravenous Once Nicholas Lose, MD      . prochlorperazine (COMPAZINE) tablet 10 mg  10 mg Oral Once Nicholas Lose, MD      . sodium chloride flush (NS) 0.9 % injection 10 mL  10 mL Intracatheter PRN Nicholas Lose, MD        PHYSICAL EXAMINATION: ECOG PERFORMANCE STATUS: 1 - Symptomatic but completely ambulatory  Vitals:   03/17/16 0913  BP: 118/72  Pulse: 79  Resp: 18  Temp: 97.7 F (36.5 C)   Filed Weights   03/17/16 0913  Weight: 182 lb 6.4 oz (82.7 kg)    GENERAL:alert, no distress and comfortable SKIN: skin color, texture, turgor are normal, no rashes or significant lesions EYES: normal, Conjunctiva are pink and non-injected, sclera clear OROPHARYNX:no exudate, no erythema and lips, buccal mucosa, and tongue normal  NECK: supple, thyroid normal size, non-tender, without nodularity LYMPH:  no palpable lymphadenopathy in the cervical, axillary or inguinal LUNGS: clear to auscultation and percussion with normal breathing effort HEART: regular rate & rhythm and no murmurs and no lower extremity edema ABDOMEN:abdomen soft, non-tender and normal bowel sounds MUSCULOSKELETAL:no cyanosis of digits and no clubbing  NEURO: alert & oriented x 3 with fluent speech, no focal motor/sensory deficits EXTREMITIES: No lower extremity edema LABORATORY DATA:  I have reviewed the data as listed   Chemistry      Component Value Date/Time   NA 141 03/17/2016 0849   K 4.1 03/17/2016 0849   CL 105 09/26/2015 1330   CO2 26 03/17/2016 0849   BUN 14.7 03/17/2016 0849   CREATININE 0.7 03/17/2016 0849      Component Value Date/Time   CALCIUM 9.6 03/17/2016 0849     ALKPHOS 97 03/17/2016 0849   AST 19 03/17/2016 0849   ALT 20 03/17/2016 0849   BILITOT 0.87 03/17/2016 0849       Lab Results  Component Value Date   WBC 4.5 03/17/2016   HGB 11.3 (L) 03/17/2016   HCT 33.2 (L) 03/17/2016   MCV 96.7 03/17/2016   PLT 181 03/17/2016   NEUTROABS 3.5 03/17/2016    ASSESSMENT & PLAN:  Breast cancer of upper-outer quadrant of right female breast ([place]) Bilateral mastectomies 10/01/2015 With reconstruction Right mastectomy: IDC grade 2, multifocal largest 2.5 cm, with high-grade DCIS, broadly present at anterior margin and inferior margin 2/5 LN positive, LVI Present,  Left mastectomy: ALH, ER 95%, PR 5-65%, HER-2 negative, Ki-67 10-20%,  Pathologic stage: T2 N1 a stage IIB Mammaprint: High risk CT-CAP and bone scan: No evidence of metastatic disease ----------------------------------------------------------------------------------------------------------------------------------------------------------------- Treatment plan: 1. Adjuvant chemotherapy with dose dense Adriamycin and Cytoxan 4 followed by Abraxane weekly 12 ( patient is intolerant to steroids and hence we cannot  use paclitaxel) started 11/05/2015 completed 03/17/2016 3. Followed by adjuvant radiation therapy 4. Followed by adjuvant antiestrogen therapy with tamoxifen 10 years  PREVENT trial: [id number]  ----------------------------------------------------------------------------------------------------------------------------------------------------------------- Current treatment: Completed 4 cycles ofdose dense Adriamycin Cytoxan. Today is cycle 12Abraxane  Chemo Toxicities: 1. Intermittent nausea for which she takes Zofran and Compazine around-the-clock 2. constipation: Take stool softeners 3. Alopecia 4. Mouthsore that was treated with acyclovir: Resolved 5. Chemotherapy-induced anemia: Hemoglobin 9.5 today 6. Chemotherapy-induced neutropenia: I reduced Abraxane dose  with cycle 4 to 65 mg per square 7. Fatigue due to chemotherapy  Treatment plan: Adjuvant radiation followed by adjuvant antiestrogen therapy. Patient plans to do hysterectomy and bilateral salpingo-oophorectomy in May. I will see the patient back in July to start her on aromatase inhibitor therapy    I spent 25 minutes talking to the patient of which more than half was spent in counseling and coordination of care.  No orders of the defined types were placed in this encounter.  The patient has a good understanding of the overall plan. she agrees with it. she will call with any problems that may develop before the next visit here.   Rulon Eisenmenger, MD 03/17/16

## 2016-03-17 NOTE — Assessment & Plan Note (Signed)
Bilateral mastectomies 10/01/2015 With reconstruction Right mastectomy: IDC grade 2, multifocal largest 2.5 cm, with high-grade DCIS, broadly present at anterior margin and inferior margin 2/5 LN positive, LVI Present,  Left mastectomy: ALH, ER 95%, PR 5-65%, HER-2 negative, Ki-67 10-20%,  Pathologic stage: T2 N1 a stage IIB Mammaprint: High risk CT-CAP and bone scan: No evidence of metastatic disease ----------------------------------------------------------------------------------------------------------------------------------------------------------------- Treatment plan: 1. Adjuvant chemotherapy with dose dense Adriamycin and Cytoxan 4 followed by Abraxane weekly 12 ( patient is intolerant to steroids and hence we cannot use paclitaxel) started 11/05/2015 completed 03/17/2016 3. Followed by adjuvant radiation therapy 4. Followed by adjuvant antiestrogen therapy with tamoxifen 10 years  PREVENT trial: [id number]  ----------------------------------------------------------------------------------------------------------------------------------------------------------------- Current treatment: Completed 4 cycles ofdose dense Adriamycin Cytoxan. Today is cycle 12Abraxane  Chemo Toxicities: 1. Intermittent nausea for which she takes Zofran and Compazine around-the-clock 2. constipation: Take stool softeners 3. Alopecia 4. Mouthsore that was treated with acyclovir: Resolved 5. Chemotherapy-induced anemia: Hemoglobin 9.5 today 6. Chemotherapy-induced neutropenia: I reduced Abraxane dose with cycle 4 to 65 mg per square 7. Fatigue due to chemotherapy  Treatment plan: Adjuvant radiation followed by adjuvant antiestrogen therapy. Return to clinic at the end of radiation to discuss starting antiestrogen treatment.

## 2016-03-17 NOTE — Patient Instructions (Signed)
[facility] Discharge Instructions for Patients Receiving Chemotherapy  Today you received the following chemotherapy agents: Abraxane   To help prevent nausea and vomiting after your treatment, we encourage you to take your nausea medication as directed.    If you develop nausea and vomiting that is not controlled by your nausea medication, call the clinic.   BELOW ARE SYMPTOMS THAT SHOULD BE REPORTED IMMEDIATELY:  *FEVER GREATER THAN 100.5 F  *CHILLS WITH OR WITHOUT FEVER  NAUSEA AND VOMITING THAT IS NOT CONTROLLED WITH YOUR NAUSEA MEDICATION  *UNUSUAL SHORTNESS OF BREATH  *UNUSUAL BRUISING OR BLEEDING  TENDERNESS IN MOUTH AND THROAT WITH OR WITHOUT PRESENCE OF ULCERS  *URINARY PROBLEMS  *BOWEL PROBLEMS  UNUSUAL RASH Items with * indicate a potential emergency and should be followed up as soon as possible.  Feel free to call the clinic you have any questions or concerns. The clinic phone number is [phone].  Please show the CHEMO ALERT CARD at check-in to the Emergency Department and triage nurse.   

## 2016-03-18 ENCOUNTER — Other Ambulatory Visit: Payer: Self-pay | Admitting: Emergency Medicine

## 2016-03-18 ENCOUNTER — Other Ambulatory Visit: Payer: Self-pay | Admitting: *Deleted

## 2016-03-18 MED ORDER — AZITHROMYCIN 250 MG PO TABS: ORAL_TABLET | ORAL | 0 refills | Status: DC

## 2016-03-18 MED ORDER — AZITHROMYCIN 1 G PO PACK: 1.0000 g | PACK | Freq: Once | ORAL | 0 refills | Status: DC

## 2016-03-18 MED ORDER — HYDROCOD POLST-CPM POLST ER 10-8 MG/5ML PO SUER
5.0000 mL | Freq: Two times a day (BID) | ORAL | 0 refills | Status: DC | PRN
Start: 2016-03-18 — End: 2016-03-19

## 2016-03-18 NOTE — Telephone Encounter (Signed)
Pt called with c/o cold and cough for greater than 1 week. Currently using "mucinex DM" without relief. Per Dr. Lindi Adie, z pak ordered and sent to pharmacy. Pt called with instructions.

## 2016-03-19 ENCOUNTER — Other Ambulatory Visit: Payer: Self-pay | Admitting: *Deleted

## 2016-03-19 MED ORDER — HYDROCOD POLST-CPM POLST ER 10-8 MG/5ML PO SUER: 5.0000 mL | Freq: Two times a day (BID) | ORAL | 0 refills | Status: DC | PRN

## 2016-03-19 MED FILL — HYDROCODONE-CHLORPHENIRAM S: 10-8 | 14 days supply | Qty: 140 | Fill #0

## 2016-03-24 ENCOUNTER — Other Ambulatory Visit: Payer: Self-pay | Admitting: General Surgery

## 2016-03-29 ENCOUNTER — Encounter (HOSPITAL_BASED_OUTPATIENT_CLINIC_OR_DEPARTMENT_OTHER): Payer: Self-pay | Admitting: *Deleted

## 2016-04-01 ENCOUNTER — Encounter (HOSPITAL_BASED_OUTPATIENT_CLINIC_OR_DEPARTMENT_OTHER): Payer: Self-pay | Admitting: Anesthesiology

## 2016-04-01 ENCOUNTER — Encounter (HOSPITAL_BASED_OUTPATIENT_CLINIC_OR_DEPARTMENT_OTHER): Payer: Self-pay

## 2016-04-01 ENCOUNTER — Ambulatory Visit (HOSPITAL_BASED_OUTPATIENT_CLINIC_OR_DEPARTMENT_OTHER)
Admission: RE | Admit: 2016-04-01 | Discharge: 2016-04-01 | Disposition: A | Discharge status: Home / Self Care | Payer: 59 | Source: Ambulatory Visit | Attending: General Surgery | Admitting: General Surgery

## 2016-04-01 ENCOUNTER — Encounter (HOSPITAL_BASED_OUTPATIENT_CLINIC_OR_DEPARTMENT_OTHER)
Admission: RE | Disposition: A | Discharge status: Home / Self Care | Payer: Self-pay | Source: Ambulatory Visit | Attending: General Surgery

## 2016-04-01 DIAGNOSIS — Z833 Family history of diabetes mellitus: Secondary | ICD-10-CM | POA: Diagnosis not present

## 2016-04-01 DIAGNOSIS — Z9889 Other specified postprocedural states: Secondary | ICD-10-CM | POA: Insufficient documentation

## 2016-04-01 DIAGNOSIS — Z810 Family history of intellectual disabilities: Secondary | ICD-10-CM | POA: Diagnosis not present

## 2016-04-01 DIAGNOSIS — Z9013 Acquired absence of bilateral breasts and nipples: Secondary | ICD-10-CM | POA: Diagnosis not present

## 2016-04-01 DIAGNOSIS — Z803 Family history of malignant neoplasm of breast: Secondary | ICD-10-CM | POA: Diagnosis not present

## 2016-04-01 DIAGNOSIS — Z888 Allergy status to other drugs, medicaments and biological substances status: Secondary | ICD-10-CM | POA: Diagnosis not present

## 2016-04-01 DIAGNOSIS — Z8261 Family history of arthritis: Secondary | ICD-10-CM | POA: Diagnosis not present

## 2016-04-01 DIAGNOSIS — Z853 Personal history of malignant neoplasm of breast: Secondary | ICD-10-CM | POA: Diagnosis not present

## 2016-04-01 DIAGNOSIS — Z8249 Family history of ischemic heart disease and other diseases of the circulatory system: Secondary | ICD-10-CM | POA: Diagnosis not present

## 2016-04-01 DIAGNOSIS — Z975 Presence of (intrauterine) contraceptive device: Secondary | ICD-10-CM | POA: Diagnosis not present

## 2016-04-01 DIAGNOSIS — Z881 Allergy status to other antibiotic agents status: Secondary | ICD-10-CM | POA: Diagnosis not present

## 2016-04-01 DIAGNOSIS — Z79899 Other long term (current) drug therapy: Secondary | ICD-10-CM | POA: Insufficient documentation

## 2016-04-01 DIAGNOSIS — Z811 Family history of alcohol abuse and dependence: Secondary | ICD-10-CM | POA: Diagnosis not present

## 2016-04-01 DIAGNOSIS — Z823 Family history of stroke: Secondary | ICD-10-CM | POA: Insufficient documentation

## 2016-04-01 DIAGNOSIS — Z818 Family history of other mental and behavioral disorders: Secondary | ICD-10-CM | POA: Diagnosis not present

## 2016-04-01 DIAGNOSIS — M1711 Unilateral primary osteoarthritis, right knee: Secondary | ICD-10-CM | POA: Diagnosis not present

## 2016-04-01 DIAGNOSIS — Z8059 Family history of malignant neoplasm of other urinary tract organ: Secondary | ICD-10-CM | POA: Insufficient documentation

## 2016-04-01 DIAGNOSIS — Z452 Encounter for adjustment and management of vascular access device: Secondary | ICD-10-CM | POA: Insufficient documentation

## 2016-04-01 HISTORY — PX: PORT-A-CATH REMOVAL: SHX5289

## 2016-04-01 SURGERY — MINOR REMOVAL PORT-A-CATH
Anesthesia: LOCAL | Site: Chest | Laterality: Right

## 2016-04-01 MED ORDER — BUPIVACAINE HCL (PF) 0.25 % IJ SOLN
INTRAMUSCULAR | Status: AC
  Filled 2016-04-01: qty 30

## 2016-04-01 MED ORDER — LIDOCAINE-EPINEPHRINE 0.5 %-1:200000 IJ SOLN
INTRAMUSCULAR | Status: AC
  Filled 2016-04-01: qty 1

## 2016-04-01 MED ORDER — LIDOCAINE-EPINEPHRINE 0.5 %-1:200000 IJ SOLN
INTRAMUSCULAR | Status: DC | PRN
  Administered 2016-04-01: 5 mL

## 2016-04-01 MED ORDER — CHLORHEXIDINE GLUCONATE CLOTH 2 % EX PADS: 6.0000 | MEDICATED_PAD | Freq: Once | CUTANEOUS | Status: DC

## 2016-04-01 MED ORDER — BUPIVACAINE HCL (PF) 0.25 % IJ SOLN
INTRAMUSCULAR | Status: DC | PRN
  Administered 2016-04-01: 5 mL

## 2016-04-01 SURGICAL SUPPLY — 28 items
BLADE SURG 15 STRL LF DISP TIS (BLADE) ×1 IMPLANT
BLADE SURG 15 STRL SS (BLADE) ×1
CHLORAPREP W/TINT 26ML (MISCELLANEOUS) ×2 IMPLANT
COVER BACK TABLE 60X90IN (DRAPES) ×2 IMPLANT
COVER MAYO STAND STRL (DRAPES) ×2 IMPLANT
DECANTER SPIKE VIAL GLASS SM (MISCELLANEOUS) IMPLANT
DERMABOND ADVANCED (GAUZE/BANDAGES/DRESSINGS) ×1
DERMABOND ADVANCED .7 DNX12 (GAUZE/BANDAGES/DRESSINGS) ×1 IMPLANT
DRAPE LAPAROTOMY 100X72 PEDS (DRAPES) ×2 IMPLANT
ELECT COATED BLADE 2.86 ST (ELECTRODE) ×2 IMPLANT
ELECT REM PT RETURN 9FT ADLT (ELECTROSURGICAL) ×2
ELECTRODE REM PT RTRN 9FT ADLT (ELECTROSURGICAL) ×1 IMPLANT
GLOVE BIO SURGEON STRL SZ 6.5 (GLOVE) ×4 IMPLANT
GLOVE BIO SURGEON STRL SZ7 (GLOVE) ×2 IMPLANT
GLOVE BIOGEL PI IND STRL 7.0 (GLOVE) ×2 IMPLANT
GLOVE BIOGEL PI IND STRL 7.5 (GLOVE) ×1 IMPLANT
GLOVE BIOGEL PI INDICATOR 7.0 (GLOVE) ×2
GLOVE BIOGEL PI INDICATOR 7.5 (GLOVE) ×1
GOWN STRL REUS W/ TWL LRG LVL3 (GOWN DISPOSABLE) ×3 IMPLANT
GOWN STRL REUS W/TWL LRG LVL3 (GOWN DISPOSABLE) ×3
NEEDLE HYPO 25X1 1.5 SAFETY (NEEDLE) ×2 IMPLANT
PACK BASIN DAY SURGERY FS (CUSTOM PROCEDURE TRAY) ×2 IMPLANT
PENCIL BUTTON HOLSTER BLD 10FT (ELECTRODE) ×2 IMPLANT
SUT MON AB 4-0 PC3 18 (SUTURE) ×2 IMPLANT
SUT VIC AB 3-0 SH 27 (SUTURE) ×1
SUT VIC AB 3-0 SH 27X BRD (SUTURE) ×1 IMPLANT
SYR CONTROL 10ML LL (SYRINGE) ×2 IMPLANT
TOWEL OR 17X24 6PK STRL BLUE (TOWEL DISPOSABLE) ×2 IMPLANT

## 2016-04-01 NOTE — H&P (Signed)
Melanie Barber is an 51 y.o. female.   Chief Complaint: breast cancer s/p treatment HPI: 65 yof completed breast cancer treatment for port removal  Past Medical History:  Diagnosis Date  . Arthritis    right knee (10/01/2015)  . Cancer of right breast ([place])   . Migraine    "once when I was going thru a divorce; called them cluster" (10/01/2015)  . PONV (postoperative nausea and vomiting)   . Sciatic leg pain    left leg    Past Surgical History:  Procedure Laterality Date  . BREAST BIOPSY Right   . BREAST CYST ASPIRATION Right   . BREAST RECONSTRUCTION WITH PLACEMENT OF TISSUE EXPANDER AND FLEX HD (ACELLULAR HYDRATED DERMIS) Bilateral 10/01/2015  . BREAST RECONSTRUCTION WITH PLACEMENT OF TISSUE EXPANDER AND FLEX HD (ACELLULAR HYDRATED DERMIS) Bilateral 10/01/2015   Procedure: BREAST RECONSTRUCTION WITH PLACEMENT OF TISSUE EXPANDER AND CORTIVA;  Surgeon: Irene Limbo, MD;  Location: [place];  Service: Plastics;  Laterality: Bilateral;  . [place]   growth removed  . MASTECTOMY Left 10/01/2015   NIPPLE SPARING  . MASTECTOMY COMPLETE / SIMPLE W/ SENTINEL NODE BIOPSY Right 10/01/2015   NIPPLE SPARING  . NASAL/SINUS ENDOSCOPY Bilateral 2005  . NIPPLE SPARING MASTECTOMY/SENTINAL LYMPH NODE BIOPSY/RECONSTRUCTION/PLACEMENT OF TISSUE EXPANDER Bilateral 10/01/2015   Procedure: BILATERAL NIPPLE SPARING MASTECTOMY WITH RIGHT SENTINAL LYMPH NODE BIOPSY;  Surgeon: Rolm Bookbinder, MD;  Location: [facility];  Service: General;  Laterality: Bilateral;  . PORTACATH PLACEMENT Right 10/01/2015  . PORTACATH PLACEMENT Right 10/01/2015   Procedure: INSERTION PORT-A-CATH;  Surgeon: Rolm Bookbinder, MD;  Location: [facility];  Service: General;  Laterality: Right;  . TOE SURGERY Left 1990   "bone spur on great toe"  . TONSILLECTOMY  1977  . ULTRASOUND GUIDANCE FOR VASCULAR ACCESS Right 10/01/2015   Procedure: ULTRASOUND GUIDANCE;  Surgeon: Rolm Bookbinder, MD;  Location: [place];  Service:  General;  Laterality: Right;  . WISDOM TOOTH EXTRACTION      Family History  Problem Relation Age of Onset  . Depression Mother   . Inflammatory bowel disease Mother   . Alcohol abuse Mother   . Bipolar disorder Mother   . Hypertension Son   . Other Brother     severe intellectual disabilities  . Arthritis Maternal Uncle     hx knee replacement surgeries  . Stroke Maternal Grandmother 86  . Breast cancer Maternal Grandmother     dx before menopause  . Uterine cancer Maternal Grandmother     dx late 9s  . Bipolar disorder Maternal Grandfather   . Stroke Paternal Grandmother 41  . Diabetes Paternal Grandmother     later in life  . Heart attack Paternal Grandfather    Social History:  reports that she has never smoked. She has never used smokeless tobacco. She reports that she drinks alcohol. She reports that she does not use drugs.  Allergies:  Allergies  Allergen Reactions  . Keflex [Cephalexin] Anaphylaxis  . Decadron [Dexamethasone] Anxiety    Medications Prior to Admission  Medication Sig Dispense Refill  . acetaminophen (TYLENOL) 500 MG tablet Take 1,000 mg by mouth.    . Atorvastatin Calcium (INVESTIGATIONAL ATORVASTATIN/PLACEBO) 40 MG tablet [facility] [phone] Take 1 tablet by mouth daily. Take 2 doses (these doses must be 12 hours apart) prior to first chemotherapy treatment. Then take 1 tablet daily by mouth with or without food. 180 tablet 0  . Biotin (BIOTIN 5000) 5 MG CAPS Take by mouth.    Marland Kitchen  ibuprofen (ADVIL,MOTRIN) 200 MG tablet Take 400 mg by mouth every 6 (six) hours as needed for mild pain.    Marland Kitchen levonorgestrel (MIRENA) 20 MCG/24HR IUD 1 each by Intrauterine route once.    . Multiple Vitamin (MULTIVITAMIN WITH MINERALS) TABS tablet Take 1 tablet by mouth daily.    . Probiotic Product (PRO-BIOTIC BLEND PO) Take by mouth.    . PSYLLIUM HUSK PO Take by mouth.    Marland Kitchen LORazepam (ATIVAN) 0.5 MG tablet Take by mouth.      No results found for this or any  previous visit (from the past 48 hour(s)). No results found.  Review of Systems  Constitutional: Negative for fever.  All other systems reviewed and are negative.   Blood pressure 108/60, pulse 79, temperature 97.9 F (36.6 C), temperature source Oral, resp. rate 18, height 5\' 7"  (1.702 m), weight 82.6 kg (182 lb), SpO2 99 %. Physical Exam  cv rrr Lungs clear Port in place Assessment/Plan Breast cancer Remove port   Erice Ahles, MD 04/01/2016, 7:16 AM

## 2016-04-01 NOTE — Discharge Instructions (Signed)
° ° °  PORT-A-CATH: POST OP INSTRUCTIONS  Always review your discharge instruction sheet given to you by the facility where your surgery was performed.   1. A prescription for pain medication may be given to you upon discharge. Take your pain medication as prescribed, if needed. If narcotic pain medicine is not needed, then you make take acetaminophen (Tylenol) or ibuprofen (Advil) as needed.  2. Take your usually prescribed medications unless otherwise directed. 3. If you need a refill on your pain medication, please contact our office. All narcotic pain medicine now requires a paper prescription.  Phoned in and fax refills are no longer allowed by law.  Prescriptions will not be filled after 5 pm or on weekends.  4. You should follow a light diet for the remainder of the day after your procedure. 5. Most patients will experience some mild swelling and/or bruising in the area of the incision. It may take several days to resolve. 6. It is common to experience some constipation if taking pain medication after surgery. Increasing fluid intake and taking a stool softener (such as Colace) will usually help or prevent this problem from occurring. A mild laxative (Milk of Magnesia or Miralax) should be taken according to package directions if there are no bowel movements after 48 hours.  7. Unless discharge instructions indicate otherwise, you may remove your bandages 48 hours after surgery, and you may shower at that time. You may have steri-strips (small white skin tapes) in place directly over the incision.  These strips should be left on the skin for 7-10 days.  If your surgeon used Dermabond (skin glue) on the incision, you may shower in 24 hours.  The glue will flake off over the next 2-3 weeks.  8. If your port is left accessed at the end of surgery (needle left in port), the dressing cannot get wet and should only by changed by a healthcare professional. When the port is no longer accessed (when the  needle has been removed), follow step 7.   9. ACTIVITIES:  Limit activity involving your arms for the next 72 hours. Do no strenuous exercise or activity for 1 week. You may drive when you are no longer taking prescription pain medication, you can comfortably wear a seatbelt, and you can maneuver your car. 10.You may need to see your doctor in the office for a follow-up appointment.  Please       check with your doctor.    WHEN TO CALL YOUR DOCTOR [phone]): 1. Fever over 101.0 2. Chills 3. Continued bleeding from incision 4. Increased redness and tenderness at the site 5. Shortness of breath, difficulty breathing   The clinic staff is available to answer your questions during regular business hours. Please dont hesitate to call and ask to speak to one of the nurses or medical assistants for clinical concerns. If you have a medical emergency, go to the nearest emergency room or call 911.  A surgeon from [facility] is always on call at the hospital.     For further information, please visit [email or web address]

## 2016-04-01 NOTE — Interval H&P Note (Signed)
History and Physical Interval Note:  04/01/2016 7:17 AM  Melanie Barber  has presented today for surgery, with the diagnosis of breast cancer  The various methods of treatment have been discussed with the patient and family. After consideration of risks, benefits and other options for treatment, the patient has consented to  Procedure(s): MINOR REMOVAL PORT-A-CATH (N/A) as a surgical intervention .  The patient's history has been reviewed, patient examined, no change in status, stable for surgery.  I have reviewed the patient's chart and labs.  Questions were answered to the patient's satisfaction.     Kelan Pritt

## 2016-04-01 NOTE — Op Note (Signed)
Preoperative diagnosis: breast cancer no longer needs venous access Postoperative diagnosis: same as above Procedure: port removal Surgeon Dr Serita Grammes Estimated blood loss: Minimal Drains: None Complications: None Anesthesia: Local Specimens: None Sponge count correct at completion Disposition to recovery stable  Indications: This is a 51 year old female who has completed treatment for breast cancer. She desired port removal. We discussed doing this under local anesthesia.  Procedure: After informed consent was obtained the patient was taken to the operating room. She was prepped and draped in the standard sterile surgical fashion. A surgical timeout was then performed.  I infiltrated a mixture of lidocaine and Marcaine in her prior incision. I then reentered this incision. The port and the line were removed in their entirety. I removed all the stitch material from the pocket. This was hemostatic. I then closed this with 3-0 Vicryl and 4-0 Monocryl. Glue  was placed. She tolerated this well.

## 2016-04-06 ENCOUNTER — Encounter (HOSPITAL_BASED_OUTPATIENT_CLINIC_OR_DEPARTMENT_OTHER): Payer: Self-pay | Admitting: General Surgery

## 2016-04-07 NOTE — Progress Notes (Signed)
Melanie Barber 51  y.o. woman with Malignant neoplasm of upper-outer quadrant of right breast in female, estrogen receptor positive follow up new.  Here to have more discussion on radiation after completing chemotherapy with dose dense Adriamycin and Cytoxan 4 followed by Abraxane weekly 12  started 11/05/2015 completed 03/17/2016  Location of Breast Cancer: Right Breast 9:30 o'clock position Upper Outer Quadrant  Histology per Pathology Report: Diagnosis: 09/09/2015: 1. Breast, right, needle core biopsy, upper outer anterior - DUCTAL CARCINOMA IN SITU WITH CALCIFICATIONS AND NECROSIS.- SEE MICROSCOPIC DESCRIPTION. 2. Breast, right, needle core biopsy, 9:30, 5cm from nipple - INVASIVE MAMMARY CARCINOMA.- DUCTAL CARCINOMA IN SITU. 3. Breast, right, needle core biopsy, 8:30, 3cm from nipple - INTRAMAMMARY LYMPH NODE WITH METASTATIC CARCINOMA.- SEPARATE FOCUS OF INVASIVE MAMMARY CARCINOMA.  Receptor Status: ER(95%+), PR (65%+), Her2-neu (neg  Ratio 1.70   ) Ki-67(20%)  Did patient present with symptoms (if so, please note symptoms) or was this found on screening mammography?: N/A  PROCEDURE: 10/01/2015:1. Bilateral breast reconstruction with tissue expander 2. Acellular dermis (Cortiva) for breast reconstruction 80 cm2  SURGEON:Brinda Thimmappa MD MBA,02-25-16 follow up,seen 10/09/15  Past/Anticipated interventions by surgeon, if any:-Diagnosis 10/01/2015: Dr. Rolm Bookbinder, MD` 1. Breast, simple mastectomy, Left - LOBULAR NEOPLASIA (ATYPICAL LOBULAR HYPERPLASIA). - FIBROCYSTIC CHANGES WITH ADENOSIS.  2. Nipple Biopsy, Left - BENIGN BREAST PARENCHYMA. - THERE IS NO EVIDENCE OF MALIGNANCY. 3. Breast, simple mastectomy, Right - INVASIVE DUCTAL CARCINOMA, GRADE II/III, MULTIPLE FOCI, THE LARGEST SPANS 2.5 CM. - DUCTAL CARCINOMA IN SITU WITH CALCIFICATIONS, HIGH GRADE.- INVASIVE CARCINOMA IS BROADLY PRESENT AT THE ANTERIOR MARGIN (UPPER CENTRAL PORTION OFSPECIMEN).- SEE ONCOLOGY  TABLE BELOW. 4. Nipple Biopsy, Right - INVASIVE DUCTAL CARCINOMA, BROADLY PRESENT AT THE INFERIOR MARGIN. 5. Lymph node, sentinel, biopsy, Right - THERE IS NO EVIDENCE OF CARCINOMA IN 1 OF 1 LYMPH NODE (0/1). 6. Lymph node, sentinel, biopsy, Right - METASTATIC CARCINOMA IN 1 OF 1 LYMPH NODE (1/1). 7. Lymph node, sentinel, biopsy, Right - THERE IS NO EVIDENCE OF CARCINOMA IN 1 OF 1 LYMPH NODE (0/1). 8. Lymph node, sentinel, biopsy, Right - THERE IS NO EVIDENCE OF CARCINOMA IN 1 OF 1 LYMPH NODE (0/1). 9. Lymph node, sentinel, biopsy, Right - METASTATIC CARCINOMA IN 1 OF 1 LYMPH NODE (1/1). Saw Dr. Donne Hazel 10/09/15 stated everything looking good per patient  Past/Anticipated interventions by medical oncology, if any: Chemotherapy :03-17-16 Dr. Lindi Adie  Adriamycin and Cytoxan 4 followed by Abraxane weekly 12  started 11/05/2015 completed 03/17/2016, radiation followed by adjuvant antiestrogen therapy with tamoxifen 10 year   Dr. Lindi Adie, MD, genetics testing,on Tamoxifen therapy 09/12/15  Until surgery, Saw Dr. Lindi Adie 10/09/15 started Provent trial   Lymphedema issues, if any:  None  Pain issues, if any:  Breasts, taking robaxin and tylenol for pain and spasms in breasts  SAFETY ISSUES: no  Prior radiation? NO  Pacemaker/ICD? NO  Possible current pregnancy? NO  Is the patient on methotrexate?  NO  Current Complaints / other details:  ICU RN Zacarias Pontes, menarche 39, G1 P1, Allergies: Keflex=Anaphylaxis   Skin status: Bilateral breast with normal skin color still has some soreness to the right side. Arm mobility:Able to raise right arm without discomfort. Appetite:Appetite is good eating three meals per day. Pain: None Fatigue: Energy level is much improved. Weight: Wt Readings from Last 3 Encounters:  03/29/16 182 lb (82.6 kg)  03/17/16 182 lb 6.4 oz (82.7 kg)  03/03/16 182 lb 12.8 oz (82.9 kg)  BP 112/72   Pulse 72  Temp 98.4 F (36.9 C) (Oral)   Resp 18   Ht '5\' 7"'$   (1.702 m)   Wt 183 lb (83 kg)   SpO2 99%   BMI 28.66 kg/m

## 2016-04-09 ENCOUNTER — Encounter: Payer: Self-pay | Admitting: *Deleted

## 2016-04-12 ENCOUNTER — Other Ambulatory Visit: Payer: Self-pay | Admitting: *Deleted

## 2016-04-12 ENCOUNTER — Ambulatory Visit: Payer: 59

## 2016-04-12 ENCOUNTER — Ambulatory Visit
Admission: RE | Admit: 2016-04-12 | Discharge: 2016-04-12 | Disposition: A | Discharge status: Home / Self Care | Payer: 59 | Source: Ambulatory Visit | Attending: Radiation Oncology | Admitting: Radiation Oncology

## 2016-04-12 ENCOUNTER — Encounter: Payer: Self-pay | Admitting: Radiation Oncology

## 2016-04-12 VITALS — BP 112/72 | HR 72 | Temp 98.4°F | Resp 18 | Ht 67.0 in | Wt 183.0 lb

## 2016-04-12 DIAGNOSIS — C50411 Malignant neoplasm of upper-outer quadrant of right female breast: Secondary | ICD-10-CM

## 2016-04-12 DIAGNOSIS — Z170 Estrogen receptor positive status [ER+]: Principal | ICD-10-CM

## 2016-04-12 DIAGNOSIS — Z9221 Personal history of antineoplastic chemotherapy: Secondary | ICD-10-CM | POA: Diagnosis not present

## 2016-04-12 DIAGNOSIS — Z9013 Acquired absence of bilateral breasts and nipples: Secondary | ICD-10-CM | POA: Diagnosis not present

## 2016-04-12 DIAGNOSIS — Z79899 Other long term (current) drug therapy: Secondary | ICD-10-CM | POA: Insufficient documentation

## 2016-04-13 NOTE — Progress Notes (Signed)
Radiation Oncology         [phone] ________________________________  Name: Melanie Barber MRN: [id number]  Date: 04/12/2016  DOB: Mar 07, 1965  GD:[id number], Provider, MD  Nicholas Lose, MD     REFERRING PHYSICIAN: Nicholas Lose, MD   DIAGNOSIS: The encounter diagnosis was Malignant neoplasm of upper-outer quadrant of right breast in female, estrogen receptor positive ([place]).   [id number] IDC right breast   HISTORY OF PRESENT ILLNESS:Melanie Barber is a 51 y.o. female with right breast cancer. Routine screening mammogram detected multiple abnormalities within the right breast. The patient underwent biopsy of a right breast mass as well as an apparent intramammary lymph node along with an area of microcalcifications which also were suspicious. The microcalcifications returned positive for ductal carcinoma in situ. The breast mass and intramammary lymph node were both positive for invasive ductal carcinoma. Receptor studies indicated that the invasive disease was ER positive, PR positive, and HER-2/neu negative. This represented grade 2 invasive ductal carcinoma. The patient then subsequently underwent a breast MRI scan which demonstrated multifocal involvement of the right breast. This primarily occupied the upper outer and lower outer quadrants. A abnormal appearing slightly sub-pectoral right axillary lymph node was also seen. No MRI evidence of malignancy within the left breast.   The patient decided to proceed with a bilateral mastectomy with reconstruction. This was completed on 10/01/2015. Left simple mastectomy did not reveal any evidence of malignancy.  Right mastectomy revealed an invasive ductal carcinoma, grade 2, with multiple foci. The largest focus measured 2.5 cm. High-grade ductal carcinoma in situ with calcifications were also present. Invasive carcinoma was felt to be broadly present at the anterior margin. A total of 5 sentinel lymph nodes were removed with 2 revealing  carcinoma. This therefore represented pathologically T2 N1 a M0 disease.  The patient has also completed chemotherapy with Dr. Lindi Adie in the adjuvant setting on 03/17/16. She comes today to discuss the role of adjuvant postmastectomy radiotherapy.    PREVIOUS RADIATION THERAPY: No   PAST MEDICAL HISTORY:  Past Medical History:  Diagnosis Date  . Arthritis    right knee (10/01/2015)  . Cancer of right breast ([place])   . Migraine    "once when I was going thru a divorce; called them cluster" (10/01/2015)  . PONV (postoperative nausea and vomiting)   . Sciatic leg pain    left leg    PAST SURGICAL HISTORY: Past Surgical History:  Procedure Laterality Date  . BREAST BIOPSY Right   . BREAST CYST ASPIRATION Right   . BREAST RECONSTRUCTION WITH PLACEMENT OF TISSUE EXPANDER AND FLEX HD (ACELLULAR HYDRATED DERMIS) Bilateral 10/01/2015  . BREAST RECONSTRUCTION WITH PLACEMENT OF TISSUE EXPANDER AND FLEX HD (ACELLULAR HYDRATED DERMIS) Bilateral 10/01/2015   Procedure: BREAST RECONSTRUCTION WITH PLACEMENT OF TISSUE EXPANDER AND CORTIVA;  Surgeon: Irene Limbo, MD;  Location: [place];  Service: Plastics;  Laterality: Bilateral;  . [place]   growth removed  . MASTECTOMY Left 10/01/2015   NIPPLE SPARING  . MASTECTOMY COMPLETE / SIMPLE W/ SENTINEL NODE BIOPSY Right 10/01/2015   NIPPLE SPARING  . NASAL/SINUS ENDOSCOPY Bilateral 2005  . NIPPLE SPARING MASTECTOMY/SENTINAL LYMPH NODE BIOPSY/RECONSTRUCTION/PLACEMENT OF TISSUE EXPANDER Bilateral 10/01/2015   Procedure: BILATERAL NIPPLE SPARING MASTECTOMY WITH RIGHT SENTINAL LYMPH NODE BIOPSY;  Surgeon: Rolm Bookbinder, MD;  Location: [place];  Service: General;  Laterality: Bilateral;  . PORT-A-CATH REMOVAL Right 04/01/2016   Procedure: MINOR REMOVAL PORT-A-CATH;  Surgeon: Rolm Bookbinder, MD;  Location: [place];  Service: General;  Laterality: Right;  . PORTACATH PLACEMENT Right 10/01/2015  . PORTACATH PLACEMENT Right  10/01/2015   Procedure: INSERTION PORT-A-CATH;  Surgeon: Emelia Loron, MD;  Location: [facility];  Service: General;  Laterality: Right;  . TOE SURGERY Left 1990   "bone spur on great toe"  . TONSILLECTOMY  1977  . ULTRASOUND GUIDANCE FOR VASCULAR ACCESS Right 10/01/2015   Procedure: ULTRASOUND GUIDANCE;  Surgeon: Emelia Loron, MD;  [facility];  Service: General;  Laterality: Right;  . WISDOM TOOTH EXTRACTION       FAMILY HISTORY:  Family History  Problem Relation Age of Onset  . Depression Mother   . Inflammatory bowel disease Mother   . Alcohol abuse Mother   . Bipolar disorder Mother   . Hypertension Son   . Other Brother     severe intellectual disabilities  . Arthritis Maternal Uncle     hx knee replacement surgeries  . Stroke Maternal Grandmother 100  . Breast cancer Maternal Grandmother     dx before menopause  . Uterine cancer Maternal Grandmother     dx late 64s  . Bipolar disorder Maternal Grandfather   . Stroke Paternal Grandmother 22  . Diabetes Paternal Grandmother     later in life  . Heart attack Paternal Grandfather      SOCIAL HISTORY:  reports that she has never smoked. She has never used smokeless tobacco. She reports that she drinks alcohol. She reports that she does not use drugs. The patient is in a relationship. 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    ALLERGIES: Keflex [cephalexin] and Decadron [dexamethasone]   MEDICATIONS:  Current Outpatient Prescriptions  Medication Sig Dispense Refill  . Atorvastatin Calcium (INVESTIGATIONAL ATORVASTATIN/PLACEBO) 40 MG tablet [facility] [id number] Take 1 tablet by mouth daily. Take 2 doses (these doses must be 12 hours apart) prior to first chemotherapy treatment. Then take 1 tablet daily by mouth with or without food. 180 tablet 0  . Biotin (BIOTIN 5000) 5 MG CAPS Take 5 mg by mouth daily.     Marland Kitchen ibuprofen (ADVIL,MOTRIN) 200 MG tablet Take 400 mg by mouth every 6 (six) hours as  needed for mild pain.    Marland Kitchen levonorgestrel (MIRENA) 20 MCG/24HR IUD 1 each by Intrauterine route once.    . Multiple Vitamin (MULTIVITAMIN WITH MINERALS) TABS tablet Take 1 tablet by mouth daily.    . Probiotic Product (PRO-BIOTIC BLEND PO) Take 1 tablet by mouth daily.     . PSYLLIUM HUSK PO Take 1 capsule by mouth daily.     Marland Kitchen acetaminophen (TYLENOL) 500 MG tablet Take 1,000 mg by mouth.    Marland Kitchen LORazepam (ATIVAN) 0.5 MG tablet Take by mouth.     No current facility-administered medications for this encounter.      REVIEW OF SYSTEMS:  On review of systems, the patient reports that she is doing well overall. She denies any chest pain, shortness of breath, cough, fevers, chills, night sweats, unintended weight changes. She denies any bowel or bladder disturbances, and denies abdominal pain, nausea or vomiting. She denies any new musculoskeletal or joint aches or pains, new skin lesions or concerns. A complete review of systems is obtained and is otherwise negative.     PHYSICAL EXAM:  height is 5\' 7"  (1.702 m) and weight is 183 lb (83 kg). Her oral temperature is 98.4 F (36.9 C). Her blood pressure is 112/72 and her pulse is 72. Her  respiration is 18 and oxygen saturation is 99%.   In general this is a well appearing caucasian female in no acute distress. She's alert and oriented x4 and appropriate throughout the examination. Cardiopulmonary assessment is negative for acute distress and she exhibits normal effort. Bilateral breasts are examined and reveal previous mastectomy scars with tissue expanders in place. There is no palpable fluid collection of either breast, and no cellulitic changes.   ECOG = 1  0 - Asymptomatic (Fully active, able to carry on all predisease activities without restriction)  1 - Symptomatic but completely ambulatory (Restricted in physically strenuous activity but ambulatory and able to carry out work of a light or sedentary nature. For example, light housework, office  work)  2 - Symptomatic, <50% in bed during the day (Ambulatory and capable of all self care but unable to carry out any work activities. Up and about more than 50% of waking hours)  3 - Symptomatic, >50% in bed, but not bedbound (Capable of only limited self-care, confined to bed or chair 50% or more of waking hours)  4 - Bedbound (Completely disabled. Cannot carry on any self-care. Totally confined to bed or chair)  5 - Death   Eustace Pen MM, Creech RH, Tormey DC, et al. [phone]). "Toxicity and response criteria of the [facility]". [place] Oncol. 5 (6): 649-55        LABORATORY DATA:  Lab Results  Component Value Date   WBC 4.5 03/17/2016   HGB 11.3 (L) 03/17/2016   HCT 33.2 (L) 03/17/2016   MCV 96.7 03/17/2016   PLT 181 03/17/2016   Lab Results  Component Value Date   NA 141 03/17/2016   K 4.1 03/17/2016   CL 105 09/26/2015   CO2 26 03/17/2016   Lab Results  Component Value Date   ALT 20 03/17/2016   AST 19 03/17/2016   ALKPHOS 97 03/17/2016   BILITOT 0.87 03/17/2016      RADIOGRAPHY: No results found.     IMPRESSION/PLAN: 1. Stage IIB, T2, N1a ER/PR positive invasive ductal carcinoma with DCIS of the right breast. The patient is doing well since completing chemotherapy about a month ago. She is ready to move forward with radiotherapy to the right breast and regional nodes. Dr. Lisbeth Renshaw reviews her final pathology as well has her surgical plans to move forward following radiotherapy with her remaining reconstruction procedure. We discussed the risks, benefits, short, and long term effects of radiotherapy, and the patient is interested in proceeding. Dr. Lisbeth Renshaw discusses the delivery and logistics of radiotherapy and would recommend a course of 6 1/2 weeks of treatment. We will see her back about 2 weeks after surgery to move forward with the simulation and planning process and anticipate starting radiotherapy about 4 weeks after surgery. She also  plans to initiate antiestrogen therapy following radiation.   In a visit lasting 30 minutes, greater than 50% of the time was spent face to face discussing pathology, surgical plans, and the goals of radiotherapy, and coordinating the patient's care.  The above documentation reflects my direct findings during this shared patient visit. Please see the separate note by Dr. Lisbeth Renshaw on this date for the remainder of the patient's plan of care.     Carola Rhine, PAC

## 2016-04-13 NOTE — Addendum Note (Signed)
Encounter addended by: Malena Edman, RN on: 04/13/2016 12:51 PM<BR>    Actions taken: Pend clinical note, Delete clinical note

## 2016-04-14 NOTE — Progress Notes (Signed)
  Radiation Oncology         [phone]) [phone] ________________________________  Name: Melanie Barber MRN: [id number]  Date: 04/12/2016  DOB: Mar 15, 1965  DIAGNOSIS:     ICD-9-CM ICD-10-CM   1. Malignant neoplasm of upper-outer quadrant of right breast in female, estrogen receptor positive ([place]) 174.4 C50.411    V86.0 Z17.0      SIMULATION AND TREATMENT PLANNING NOTE  The patient presented for simulation prior to beginning her course of radiation treatment for her diagnosis of right-sided breast cancer. The patient was placed in a supine position on a breast board. A customized vac-lock bag was also constructed and this complex treatment device will be used on a daily basis during her treatment. In this fashion, a CT scan was obtained through the chest area and an isocenter was placed near the chest wall at the upper aspect of the right chest.  The patient will be planned to receive a course of radiation initially to a dose of 50.4 gray. This will consist of a 4 field technique targeting the right chest wall as well as the supraclavicular region. Therefore 2 customized medial and lateral tangent fields have been created targeting the chest wall, and also 2 additional customized fields have been designed to treat the supraclavicular region both with a right supraclavicular field and a right posterior axillary boost field. A forward planning/reduced field technique will also be evaluated to determine if this significantly improves the dose homogeneity of the overall plan. Therefore, additional customized blocks/fields may be necessary.  This initial treatment will be accomplished at 1.8 gray per fraction.   The initial plan will consist of a 3-D conformal technique. The target volume/scar, heart and lungs have been contoured and dose volume histograms of each of these structures will be evaluated as part of the 3-D conformal treatment planning process.   It is anticipated that the patient will then  receive a 10 gray boost to the surgical scar. This will be accomplished at 2 gray per fraction. The final anticipated total dose therefore will correspond to 60.4 gray.    _______________________________   Jodelle Gross, MD, PhD

## 2016-04-14 NOTE — Progress Notes (Signed)
  Radiation Oncology         (336) [phone] ________________________________  Name: ARCADIA GORGAS MRN: [id number]  Date: 04/12/2016  DOB: 10/07/1965  Optical Surface Tracking Plan:  Since intensity modulated radiotherapy (IMRT) and 3D conformal radiation treatment methods are predicated on accurate and precise positioning for treatment, intrafraction motion monitoring is medically necessary to ensure accurate and safe treatment delivery.  The ability to quantify intrafraction motion without excessive ionizing radiation dose can only be performed with optical surface tracking. Accordingly, surface imaging offers the opportunity to obtain 3D measurements of patient position throughout IMRT and 3D treatments without excessive radiation exposure.  I am ordering optical surface tracking for this patient's upcoming course of radiotherapy. ________________________________  Kyung Rudd, MD 04/14/2016 7:48 PM    Reference:   Ursula Alert, J, et al. Surface imaging-based analysis of intrafraction motion for breast radiotherapy patients.Journal of [place], n. 6, nov. 2014. ISSN [id number].   Available at: <http://www.jacmp.org/index.php/jacmp/article/view/4957>.

## 2016-04-15 ENCOUNTER — Encounter: Payer: Self-pay | Admitting: Radiation Oncology

## 2016-04-15 NOTE — Progress Notes (Signed)
Paperwork (fmla) received 4/11, given to nurse 4/12

## 2016-04-16 DIAGNOSIS — Z79899 Other long term (current) drug therapy: Secondary | ICD-10-CM | POA: Diagnosis not present

## 2016-04-16 DIAGNOSIS — C50411 Malignant neoplasm of upper-outer quadrant of right female breast: Secondary | ICD-10-CM | POA: Diagnosis not present

## 2016-04-19 ENCOUNTER — Ambulatory Visit: Payer: 59 | Admitting: Radiation Oncology

## 2016-04-19 ENCOUNTER — Ambulatory Visit
Admission: RE | Admit: 2016-04-19 | Discharge: 2016-04-19 | Disposition: A | Discharge status: Home / Self Care | Payer: 59 | Source: Ambulatory Visit | Attending: General Surgery | Admitting: General Surgery

## 2016-04-19 ENCOUNTER — Other Ambulatory Visit: Payer: Self-pay | Admitting: General Surgery

## 2016-04-19 DIAGNOSIS — N644 Mastodynia: Secondary | ICD-10-CM

## 2016-04-19 DIAGNOSIS — N6489 Other specified disorders of breast: Secondary | ICD-10-CM | POA: Diagnosis not present

## 2016-04-20 ENCOUNTER — Ambulatory Visit: Payer: 59

## 2016-04-21 ENCOUNTER — Ambulatory Visit: Payer: 59

## 2016-04-22 ENCOUNTER — Encounter: Payer: Self-pay | Admitting: Radiation Oncology

## 2016-04-22 ENCOUNTER — Encounter: Payer: Self-pay | Admitting: *Deleted

## 2016-04-22 ENCOUNTER — Ambulatory Visit
Admission: RE | Admit: 2016-04-22 | Discharge: 2016-04-22 | Disposition: A | Discharge status: Home / Self Care | Payer: 59 | Source: Ambulatory Visit | Attending: Radiation Oncology | Admitting: Radiation Oncology

## 2016-04-22 DIAGNOSIS — Z79899 Other long term (current) drug therapy: Secondary | ICD-10-CM | POA: Diagnosis not present

## 2016-04-22 DIAGNOSIS — C50411 Malignant neoplasm of upper-outer quadrant of right female breast: Secondary | ICD-10-CM | POA: Diagnosis not present

## 2016-04-22 MED ORDER — RADIAPLEXRX EX GEL
Freq: Once | CUTANEOUS | Status: AC
  Administered 2016-04-22: 11:00:00 via TOPICAL

## 2016-04-22 MED ORDER — ALRA NON-METALLIC DEODORANT (RAD-ONC)
1.0000 "application " | Freq: Once | TOPICAL | Status: AC
  Administered 2016-04-22: 1 via TOPICAL

## 2016-04-22 NOTE — Progress Notes (Signed)
Pt education done,  Breast, radiaplex,alra deodorant, my business card, Radiation therapy and you book given, discussed side effects, skin irritation, sharp frequent pains in breast intermittent, swelling, tenderness, fatigue, apply radiaplex after  Radiation and at bedtime daily to breat area, alra after rad tx and prn, nothing 4 hours prior to treatments, increase protein and stay hydrated,drink plenty water , use electric shaver if shaving, luke warm bath or showers, pat dry, no ruubbing scrubbing or scratching breast, get plenty rest, sleep and exercise daily, teavh back given 11:22 AM

## 2016-04-22 NOTE — Progress Notes (Signed)
Paperwork faxed to [phone], conf received, copy given to patient 04/22/16

## 2016-04-23 ENCOUNTER — Encounter: Payer: Self-pay | Admitting: *Deleted

## 2016-04-23 ENCOUNTER — Ambulatory Visit
Admission: RE | Admit: 2016-04-23 | Discharge: 2016-04-23 | Disposition: A | Discharge status: Home / Self Care | Payer: 59 | Source: Ambulatory Visit | Attending: Radiation Oncology | Admitting: Radiation Oncology

## 2016-04-23 DIAGNOSIS — C50411 Malignant neoplasm of upper-outer quadrant of right female breast: Secondary | ICD-10-CM | POA: Diagnosis not present

## 2016-04-23 DIAGNOSIS — Z79899 Other long term (current) drug therapy: Secondary | ICD-10-CM | POA: Diagnosis not present

## 2016-04-26 ENCOUNTER — Ambulatory Visit
Admission: RE | Admit: 2016-04-26 | Discharge: 2016-04-26 | Disposition: A | Discharge status: Home / Self Care | Payer: 59 | Source: Ambulatory Visit | Attending: Radiation Oncology | Admitting: Radiation Oncology

## 2016-04-26 ENCOUNTER — Encounter: Payer: Self-pay | Admitting: Hematology and Oncology

## 2016-04-26 DIAGNOSIS — Z79899 Other long term (current) drug therapy: Secondary | ICD-10-CM | POA: Diagnosis not present

## 2016-04-26 DIAGNOSIS — C50411 Malignant neoplasm of upper-outer quadrant of right female breast: Secondary | ICD-10-CM | POA: Diagnosis not present

## 2016-04-27 ENCOUNTER — Other Ambulatory Visit (HOSPITAL_BASED_OUTPATIENT_CLINIC_OR_DEPARTMENT_OTHER): Payer: 59

## 2016-04-27 ENCOUNTER — Encounter: Payer: Self-pay | Admitting: *Deleted

## 2016-04-27 ENCOUNTER — Ambulatory Visit
Admission: RE | Admit: 2016-04-27 | Discharge: 2016-04-27 | Disposition: A | Discharge status: Home / Self Care | Payer: 59 | Source: Ambulatory Visit | Attending: Radiation Oncology | Admitting: Radiation Oncology

## 2016-04-27 ENCOUNTER — Encounter: Payer: 59 | Admitting: *Deleted

## 2016-04-27 DIAGNOSIS — C50411 Malignant neoplasm of upper-outer quadrant of right female breast: Secondary | ICD-10-CM

## 2016-04-27 DIAGNOSIS — Z170 Estrogen receptor positive status [ER+]: Principal | ICD-10-CM

## 2016-04-27 DIAGNOSIS — Z79899 Other long term (current) drug therapy: Secondary | ICD-10-CM | POA: Diagnosis not present

## 2016-04-27 LAB — CBC WITH DIFFERENTIAL/PLATELET
BASO%: 1 % (ref 0.0–2.0)
BASOS ABS: 0 10*3/uL (ref 0.0–0.1)
EOS%: 2 % (ref 0.0–7.0)
Eosinophils Absolute: 0.1 10*3/uL (ref 0.0–0.5)
HEMATOCRIT: 38.1 % (ref 34.8–46.6)
HGB: 12.8 g/dL (ref 11.6–15.9)
LYMPH#: 0.6 10*3/uL — AB (ref 0.9–3.3)
LYMPH%: 16.1 % (ref 14.0–49.7)
MCH: 31.1 pg (ref 25.1–34.0)
MCHC: 33.6 g/dL (ref 31.5–36.0)
MCV: 92.7 fL (ref 79.5–101.0)
MONO#: 0.3 10*3/uL (ref 0.1–0.9)
MONO%: 7.5 % (ref 0.0–14.0)
NEUT%: 73.4 % (ref 38.4–76.8)
NEUTROS ABS: 2.8 10*3/uL (ref 1.5–6.5)
Platelets: 174 10*3/uL (ref 145–400)
RBC: 4.1 10*6/uL (ref 3.70–5.45)
RDW: 14.2 % (ref 11.2–14.5)
WBC: 3.9 10*3/uL (ref 3.9–10.3)

## 2016-04-27 LAB — COMPREHENSIVE METABOLIC PANEL
ALBUMIN: 4.2 g/dL (ref 3.5–5.0)
ALK PHOS: 87 U/L (ref 40–150)
ALT: 22 U/L (ref 0–55)
AST: 24 U/L (ref 5–34)
Anion Gap: 10 mEq/L (ref 3–11)
BUN: 13.2 mg/dL (ref 7.0–26.0)
CALCIUM: 9.8 mg/dL (ref 8.4–10.4)
CO2: 25 mEq/L (ref 22–29)
CREATININE: 0.7 mg/dL (ref 0.6–1.1)
Chloride: 105 mEq/L (ref 98–109)
EGFR: >90 mL/min/{1.73_m2} (ref >90)
Glucose: 99 mg/dl (ref 70–140)
Potassium: 4 mEq/L (ref 3.5–5.1)
SODIUM: 140 meq/L (ref 136–145)
TOTAL PROTEIN: 7.1 g/dL (ref 6.4–8.3)
Total Bilirubin: 0.8 mg/dL (ref 0.20–1.20)

## 2016-04-27 LAB — RESEARCH LABS

## 2016-04-27 MED ORDER — INV-ATORVASTATIN/PLACEBO 40 MG TABS WAKE FOREST WF 98213: 1.0000 | ORAL_TABLET | Freq: Every day | ORAL | 0 refills | Status: DC

## 2016-04-27 NOTE — Progress Notes (Signed)
04/27/16 at 1:17pm - PREVENT 6 month study visit notes- The pt was into the cancer center this morning for her scheduled radiation treatment and for her 6 month [facility] study visit.  The pt's research labs were drawn.  The pt's vitals and ht/wt were obtained.  The pt met with Vernetta Honey, research assistant, and completed her 6 month neuro booklet.  The pt also completed her questionnaires.   The research nurse reviewed the questionnaires for completeness and accuracy.  The pt met with the research nurse for the nurse exam.  The pt specifically denied any peripheral edema.  Her waist measurement was 38 inches.  The pt returned her study drug bottle with 4 remaining pills inside.  The research nurse took the bottle to the pharmacy for the drug accountability check.  The pt confirmed that she took her study drug starting on 11/03/15 through 04/27/16 (177 days).  The research nurse completed the pt's 6 month pill count, and the pt was 99% compliant with her study drug.  The pt was dispensed her next bottle of study drug today.  The pt was told to take 1 pill daily with our without food.  The pt verbalized understanding.  The pt was also told to keep recording her daily doses on her medication calendars.  The pt was unable to have her 6 month cardiac MRI because she still has her tissue expanders in place.  The pt said that her tissue expanders may come out in the fall or in December.  The pt will keep the research nurse updated on the status of her tissue expanders. The pt said that she has tolerated her study drug well.  However, she has recently developed some mild, myalgia.  The pt does not want to stop her study drug. The research nurse informed Dr. Lindi Adie about the pt's mild myalgia.   The pt wants Dr. Lindi Adie to review her ALT, AST, and CK from the labs drawn today.  The pt knows to call Dr. Lindi Adie for any worsening myalgia.  The pt was told that it is acceptable with the study for her to stop the drug for any  potential related AE's.  Pt reports a good energy level.  ECOG=0.   Brion Aliment RN, BSN, CCRP Clinical Research Nurse 04/27/2016 2:02 PM

## 2016-04-28 ENCOUNTER — Ambulatory Visit
Admission: RE | Admit: 2016-04-28 | Discharge: 2016-04-28 | Disposition: A | Discharge status: Home / Self Care | Payer: 59 | Source: Ambulatory Visit | Attending: Radiation Oncology | Admitting: Radiation Oncology

## 2016-04-28 ENCOUNTER — Encounter: Payer: Self-pay | Admitting: *Deleted

## 2016-04-28 DIAGNOSIS — C50411 Malignant neoplasm of upper-outer quadrant of right female breast: Secondary | ICD-10-CM | POA: Diagnosis not present

## 2016-04-28 DIAGNOSIS — Z79899 Other long term (current) drug therapy: Secondary | ICD-10-CM | POA: Diagnosis not present

## 2016-04-28 NOTE — Progress Notes (Signed)
04/28/16 at 2:08pm - PREVENT 6 month follow up call to pt- The pt requested that the research nurse contact her regarding her lab values. The pt said that she has developed some mild, myalgia and wanted to know the results of her AST, ALT, and CK values.  The research nurse contacted [facility] and asked that they fax the results to the site.  The research nurse received her Comprehensive Metabolic Panel, Lipid Panel, TSH, Troponin, CK, and C-Reactive Protein.  The research nurse took the pt's lab values to Dr. Lindi Adie to review.  He stated that her lab values show no evidence of a muscle issue.  He felt that the pt should remain on her study drug for now.  The pt was informed if her myalgia worsens then she must notify Dr. Lindi Adie and stop the study drug to see if her myalgia improves off the study drug.  The pt verbalizes understanding. The pt thanked the nurse for informing her of the results and discussing them with Dr. Lindi Adie.  The pt said that she may have overdone her exercise regimen last week.   Brion Aliment RN, BSN, CCRP  Clinical Research Nurse 04/28/2016 2:15 PM

## 2016-04-29 ENCOUNTER — Ambulatory Visit
Admission: RE | Admit: 2016-04-29 | Discharge: 2016-04-29 | Disposition: A | Discharge status: Home / Self Care | Payer: 59 | Source: Ambulatory Visit | Attending: Radiation Oncology | Admitting: Radiation Oncology

## 2016-04-29 DIAGNOSIS — C50411 Malignant neoplasm of upper-outer quadrant of right female breast: Secondary | ICD-10-CM | POA: Diagnosis not present

## 2016-04-29 DIAGNOSIS — Z79899 Other long term (current) drug therapy: Secondary | ICD-10-CM | POA: Diagnosis not present

## 2016-04-30 ENCOUNTER — Ambulatory Visit
Admission: RE | Admit: 2016-04-30 | Discharge: 2016-04-30 | Disposition: A | Discharge status: Home / Self Care | Payer: 59 | Source: Ambulatory Visit | Attending: Radiation Oncology | Admitting: Radiation Oncology

## 2016-04-30 DIAGNOSIS — Z79899 Other long term (current) drug therapy: Secondary | ICD-10-CM | POA: Diagnosis not present

## 2016-04-30 DIAGNOSIS — C50411 Malignant neoplasm of upper-outer quadrant of right female breast: Secondary | ICD-10-CM | POA: Diagnosis not present

## 2016-04-30 MED ORDER — RADIAPLEXRX EX GEL
Freq: Once | CUTANEOUS | Status: AC
  Administered 2016-04-30: 13:00:00 via TOPICAL

## 2016-05-03 ENCOUNTER — Ambulatory Visit
Admission: RE | Admit: 2016-05-03 | Discharge: 2016-05-03 | Disposition: A | Discharge status: Home / Self Care | Payer: 59 | Source: Ambulatory Visit | Attending: Radiation Oncology | Admitting: Radiation Oncology

## 2016-05-03 DIAGNOSIS — C50411 Malignant neoplasm of upper-outer quadrant of right female breast: Secondary | ICD-10-CM | POA: Diagnosis not present

## 2016-05-03 DIAGNOSIS — Z79899 Other long term (current) drug therapy: Secondary | ICD-10-CM | POA: Diagnosis not present

## 2016-05-04 ENCOUNTER — Ambulatory Visit
Admission: RE | Admit: 2016-05-04 | Discharge: 2016-05-04 | Disposition: A | Discharge status: Home / Self Care | Payer: 59 | Source: Ambulatory Visit | Attending: Radiation Oncology | Admitting: Radiation Oncology

## 2016-05-04 DIAGNOSIS — Z79899 Other long term (current) drug therapy: Secondary | ICD-10-CM | POA: Diagnosis not present

## 2016-05-04 DIAGNOSIS — C50411 Malignant neoplasm of upper-outer quadrant of right female breast: Secondary | ICD-10-CM | POA: Diagnosis not present

## 2016-05-05 ENCOUNTER — Ambulatory Visit
Admission: RE | Admit: 2016-05-05 | Discharge: 2016-05-05 | Disposition: A | Discharge status: Home / Self Care | Payer: 59 | Source: Ambulatory Visit | Attending: Radiation Oncology | Admitting: Radiation Oncology

## 2016-05-05 DIAGNOSIS — C50411 Malignant neoplasm of upper-outer quadrant of right female breast: Secondary | ICD-10-CM | POA: Diagnosis not present

## 2016-05-05 DIAGNOSIS — Z79899 Other long term (current) drug therapy: Secondary | ICD-10-CM | POA: Diagnosis not present

## 2016-05-06 ENCOUNTER — Ambulatory Visit
Admission: RE | Admit: 2016-05-06 | Discharge: 2016-05-06 | Disposition: A | Discharge status: Home / Self Care | Payer: 59 | Source: Ambulatory Visit | Attending: Radiation Oncology | Admitting: Radiation Oncology

## 2016-05-06 DIAGNOSIS — C50411 Malignant neoplasm of upper-outer quadrant of right female breast: Secondary | ICD-10-CM | POA: Diagnosis not present

## 2016-05-06 DIAGNOSIS — Z79899 Other long term (current) drug therapy: Secondary | ICD-10-CM | POA: Diagnosis not present

## 2016-05-07 ENCOUNTER — Ambulatory Visit
Admission: RE | Admit: 2016-05-07 | Discharge: 2016-05-07 | Disposition: A | Discharge status: Home / Self Care | Payer: 59 | Source: Ambulatory Visit | Attending: Radiation Oncology | Admitting: Radiation Oncology

## 2016-05-07 ENCOUNTER — Telehealth: Payer: Self-pay | Admitting: Hematology and Oncology

## 2016-05-07 DIAGNOSIS — C50411 Malignant neoplasm of upper-outer quadrant of right female breast: Secondary | ICD-10-CM | POA: Diagnosis not present

## 2016-05-07 DIAGNOSIS — Z79899 Other long term (current) drug therapy: Secondary | ICD-10-CM | POA: Diagnosis not present

## 2016-05-07 NOTE — Telephone Encounter (Signed)
Faxed completed paperwork to [employer] on 04/20/16 fax [phone] and scanned copy of forms into Epic under media tab

## 2016-05-10 ENCOUNTER — Encounter: Payer: Self-pay | Admitting: Gynecologic Oncology

## 2016-05-10 ENCOUNTER — Ambulatory Visit
Admission: RE | Admit: 2016-05-10 | Discharge: 2016-05-10 | Disposition: A | Discharge status: Home / Self Care | Payer: 59 | Source: Ambulatory Visit | Attending: Radiation Oncology | Admitting: Radiation Oncology

## 2016-05-10 ENCOUNTER — Encounter: Payer: Self-pay | Admitting: *Deleted

## 2016-05-10 ENCOUNTER — Ambulatory Visit: Payer: 59 | Attending: Gynecologic Oncology | Admitting: Gynecologic Oncology

## 2016-05-10 VITALS — BP 112/60 | HR 60 | Temp 98.0°F | Resp 18 | Wt 177.4 lb

## 2016-05-10 DIAGNOSIS — Z9221 Personal history of antineoplastic chemotherapy: Secondary | ICD-10-CM | POA: Insufficient documentation

## 2016-05-10 DIAGNOSIS — C50911 Malignant neoplasm of unspecified site of right female breast: Secondary | ICD-10-CM

## 2016-05-10 DIAGNOSIS — C50411 Malignant neoplasm of upper-outer quadrant of right female breast: Secondary | ICD-10-CM | POA: Diagnosis not present

## 2016-05-10 DIAGNOSIS — Z791 Long term (current) use of non-steroidal anti-inflammatories (NSAID): Secondary | ICD-10-CM | POA: Insufficient documentation

## 2016-05-10 DIAGNOSIS — Z79899 Other long term (current) drug therapy: Secondary | ICD-10-CM | POA: Diagnosis not present

## 2016-05-10 DIAGNOSIS — Z9013 Acquired absence of bilateral breasts and nipples: Secondary | ICD-10-CM | POA: Insufficient documentation

## 2016-05-10 DIAGNOSIS — C50919 Malignant neoplasm of unspecified site of unspecified female breast: Secondary | ICD-10-CM | POA: Insufficient documentation

## 2016-05-10 DIAGNOSIS — Z170 Estrogen receptor positive status [ER+]: Secondary | ICD-10-CM | POA: Insufficient documentation

## 2016-05-10 NOTE — Patient Instructions (Signed)
Preparing for your Surgery  Plan for surgery on June 22, 2016 with Dr. Everitt Amber at [place] will be scheduled for a robotic assisted total hysterectomy, bilateral salpingo-oophorectomy.  Pre-operative Testing -You will receive a phone call from presurgical testing at [facility] to arrange for a pre-operative testing appointment before your surgery.  This appointment normally occurs one to two weeks before your scheduled surgery.   -Bring your insurance card, copy of an advanced directive if applicable, medication list  -At that visit, you will be asked to sign a consent for a possible blood transfusion in case a transfusion becomes necessary during surgery.  The need for a blood transfusion is rare but having consent is a necessary part of your care.     -You should not be taking blood thinners or aspirin at least ten days prior to surgery unless instructed by your surgeon.  Day Before Surgery at [place] will be asked to take in a light diet the day before surgery.  Avoid carbonated beverages.  You will be advised to have nothing to eat or drink after midnight the evening before.     Eat a light diet the day before surgery.  Examples including soups, broths, toast, yogurt, mashed potatoes.  Things to avoid include carbonated beverages (fizzy beverages), raw fruits and raw vegetables, or beans.    If your bowels are filled with gas, your surgeon will have difficulty visualizing your pelvic organs which increases your surgical risks.  Your role in recovery Your role is to become active as soon as directed by your doctor, while still giving yourself time to heal.  Rest when you feel tired. You will be asked to do the following in order to speed your recovery:  - Cough and breathe deeply. This helps toclear and expand your lungs and can prevent pneumonia. You may be given a spirometer to practice deep breathing. A staff member will show you how to use  the spirometer. - Do mild physical activity. Walking or moving your legs help your circulation and body functions return to normal. A staff member will help you when you try to walk and will provide you with simple exercises. Do not try to get up or walk alone the first time. - Actively manage your pain. Managing your pain lets you move in comfort. We will ask you to rate your pain on a scale of zero to 10. It is your responsibility to tell your doctor or nurse where and how much you hurt so your pain can be treated.  Special Considerations -If you are diabetic, you may be placed on insulin after surgery to have closer control over your blood sugars to promote healing and recovery.  This does not mean that you will be discharged on insulin.  If applicable, your oral antidiabetics will be resumed when you are tolerating a solid diet.  -Your final pathology results from surgery should be available by the Friday after surgery and the results will be relayed to you when available.   Blood Transfusion Information WHAT IS A BLOOD TRANSFUSION? A transfusion is the replacement of blood or some of its parts. Blood is made up of multiple cells which provide different functions.  Red blood cells carry oxygen and are used for blood loss replacement.  White blood cells fight against infection.  Platelets control bleeding.  Plasma helps clot blood.  Other blood products are available for specialized needs, such as hemophilia or other clotting disorders.  BEFORE THE TRANSFUSION  Who gives blood for transfusions?   You may be able to donate blood to be used at a later date on yourself (autologous donation).  Relatives can be asked to donate blood. This is generally not any safer than if you have received blood from a stranger. The same precautions are taken to ensure safety when a relative's blood is donated.  Healthy volunteers who are fully evaluated to make sure their blood is safe. This is blood  bank blood. Transfusion therapy is the safest it has ever been in the practice of medicine. Before blood is taken from a donor, a complete history is taken to make sure that person has no history of diseases nor engages in risky social behavior (examples are intravenous drug use or sexual activity with multiple partners). The donor's travel history is screened to minimize risk of transmitting infections, such as malaria. The donated blood is tested for signs of infectious diseases, such as HIV and hepatitis. The blood is then tested to be sure it is compatible with you in order to minimize the chance of a transfusion reaction. If you or a relative donates blood, this is often done in anticipation of surgery and is not appropriate for emergency situations. It takes many days to process the donated blood. RISKS AND COMPLICATIONS Although transfusion therapy is very safe and saves many lives, the main dangers of transfusion include:   Getting an infectious disease.  Developing a transfusion reaction. This is an allergic reaction to something in the blood you were given. Every precaution is taken to prevent this. The decision to have a blood transfusion has been considered carefully by your caregiver before blood is given. Blood is not given unless the benefits outweigh the risks.

## 2016-05-10 NOTE — Progress Notes (Signed)
Consult Note: Gyn-Onc  Consult was requested by Dr. Donne Hazel for the evaluation of DARENDA FIKE 51 y.o. female  CC:  Chief Complaint  Patient presents with  . Breast Cancer    Assessment/Plan:  Ms. EMAREE CHIU  is a 51 y.o.  year old woman with a personal history of premenopausal right sided ER/PR positive breast cancer. Recommendation is for oophorectomy for surgical castration to reduce risk for recurrence.  I discussed that this is a reasonable option and could be accomplished through a minimally invasive fashion.  I would recommend performing robotic assisted BSO with hysterectomy in mid June, 2018 after she has completed primary therapy for her breast cancer.    HPI: Shaine Mount is a 51 year old woman who is seen in consultation at the request of Dr Donne Hazel for surgical castration in the setting of right ER/PR receptor positive breast cancer (s/p bilateral mastectomies (most recently right in September 2017) followed by adjuvant chemotherapy. She is currently receiving abraxane chemotherapy with Dr Sonny Dandy, for a planned additional 3 months, followed by chest wall radiation. She will then have implants placed 6 months after completing radiation.  She met with Dr Terrall Laity at [facility] for consultation and he recommended surgical castration so that she could avoid tamoxifen and lupron.   She underwent genetic testing which did not identify known deleterious mutations that predispose to breast/ovarian/uterine cancers.  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 She is healthy with no prior abdominal surgical history and has had one prior SVD.  Interval Hx:  She has completed chemotherapy and is now receiving radiation.   Current Meds:  Outpatient Encounter Prescriptions as of 05/10/2016  Medication Sig  . acetaminophen (TYLENOL) 500 MG tablet Take 1,000 mg by mouth.  . Atorvastatin Calcium (INVESTIGATIONAL ATORVASTATIN/PLACEBO) 40 MG tablet [facility] [phone]  Take 1 tablet by mouth daily. Take 2 doses (these doses must be 12 hours apart) prior to first chemotherapy treatment. Then take 1 tablet daily by mouth with or without food.  . Atorvastatin Calcium (INVESTIGATIONAL ATORVASTATIN/PLACEBO) 40 MG tablet [facility] [phone] Take 1 tablet by mouth daily. Take 1 tablet daily with or without food.  . Biotin (BIOTIN 5000) 5 MG CAPS Take 5 mg by mouth daily.   . hyaluronate sodium (RADIAPLEXRX) GEL Apply 1 application topically 2 (two) times daily.  Marland Kitchen ibuprofen (ADVIL,MOTRIN) 200 MG tablet Take 400 mg by mouth every 6 (six) hours as needed for mild pain.  Marland Kitchen levonorgestrel (MIRENA) 20 MCG/24HR IUD 1 each by Intrauterine route once.  Marland Kitchen LORazepam (ATIVAN) 0.5 MG tablet Take by mouth.  . Multiple Vitamin (MULTIVITAMIN WITH MINERALS) TABS tablet Take 1 tablet by mouth daily.  . non-metallic deodorant Jethro Poling) MISC Apply 1 application topically daily.  . Probiotic Product (PRO-BIOTIC BLEND PO) Take 1 tablet by mouth daily.   . PSYLLIUM HUSK PO Take 1 capsule by mouth daily.    No facility-administered encounter medications on file as of 05/10/2016.     Allergy:  Allergies  Allergen Reactions  . Keflex [Cephalexin] Anaphylaxis  . Decadron [Dexamethasone] Anxiety    Social Hx:   Social History   Social History  . Marital status: Divorced    Spouse name: N/A  . Number of children: N/A  . Years of education: N/A   Occupational History  . Not on file.   Social History Main Topics  . Smoking status: Never Smoker  . Smokeless tobacco: Never Used  . Alcohol use  Yes     Comment: socially  . Drug use: No  . Sexual activity: Yes    Birth control/ protection: IUD   Other Topics Concern  . Not on file   Social History Narrative  . No narrative on file    Past Surgical Hx:  Past Surgical History:  Procedure Laterality Date  . BREAST BIOPSY Right   . BREAST CYST ASPIRATION Right   . BREAST RECONSTRUCTION WITH PLACEMENT OF TISSUE EXPANDER AND  FLEX HD (ACELLULAR HYDRATED DERMIS) Bilateral 10/01/2015  . BREAST RECONSTRUCTION WITH PLACEMENT OF TISSUE EXPANDER AND FLEX HD (ACELLULAR HYDRATED DERMIS) Bilateral 10/01/2015   Procedure: BREAST RECONSTRUCTION WITH PLACEMENT OF TISSUE EXPANDER AND CORTIVA;  Surgeon: Irene Limbo, MD;  Location: [place];  Service: Plastics;  Laterality: Bilateral;  . [place]   growth removed  . MASTECTOMY Left 10/01/2015   NIPPLE SPARING  . MASTECTOMY COMPLETE / SIMPLE W/ SENTINEL NODE BIOPSY Right 10/01/2015   NIPPLE SPARING  . NASAL/SINUS ENDOSCOPY Bilateral 2005  . NIPPLE SPARING MASTECTOMY/SENTINAL LYMPH NODE BIOPSY/RECONSTRUCTION/PLACEMENT OF TISSUE EXPANDER Bilateral 10/01/2015   Procedure: BILATERAL NIPPLE SPARING MASTECTOMY WITH RIGHT SENTINAL LYMPH NODE BIOPSY;  Surgeon: Rolm Bookbinder, MD;  Location: [place];  Service: General;  Laterality: Bilateral;  . PORT-A-CATH REMOVAL Right 04/01/2016   Procedure: MINOR REMOVAL PORT-A-CATH;  Surgeon: Rolm Bookbinder, MD;  Location: [place];  Service: General;  Laterality: Right;  . PORTACATH PLACEMENT Right 10/01/2015  . PORTACATH PLACEMENT Right 10/01/2015   Procedure: INSERTION PORT-A-CATH;  Surgeon: Rolm Bookbinder, MD;  Location: [place];  Service: General;  Laterality: Right;  . TOE SURGERY Left 1990   "bone spur on great toe"  . TONSILLECTOMY  1977  . ULTRASOUND GUIDANCE FOR VASCULAR ACCESS Right 10/01/2015   Procedure: ULTRASOUND GUIDANCE;  Surgeon: Rolm Bookbinder, MD;  Location: [place];  Service: General;  Laterality: Right;  . WISDOM TOOTH EXTRACTION      Past Medical Hx:  Past Medical History:  Diagnosis Date  . Arthritis    right knee (10/01/2015)  . Cancer of right breast ([place])   . Migraine    "once when I was going thru a divorce; called them cluster" (10/01/2015)  . PONV (postoperative nausea and vomiting)   . Sciatic leg pain    left leg    Past Gynecological History:  SVD x 1. No prior abnormal  paps. Mirena since 2015. No LMP recorded. Patient has had an implant.  Family Hx:  Family History  Problem Relation Age of Onset  . Depression Mother   . Inflammatory bowel disease Mother   . Alcohol abuse Mother   . Bipolar disorder Mother   . Hypertension Son   . Other Brother     severe intellectual disabilities  . Arthritis Maternal Uncle     hx knee replacement surgeries  . Stroke Maternal Grandmother 86  . Breast cancer Maternal Grandmother     dx before menopause  . Uterine cancer Maternal Grandmother     dx late 31s  . Bipolar disorder Maternal Grandfather   . Stroke Paternal Grandmother 35  . Diabetes Paternal Grandmother     later in life  . Heart attack Paternal Grandfather     Review of Systems:  Constitutional  Feels well,    ENT Normal appearing ears and nares bilaterally Skin/Breast  No rash, sores, jaundice, itching, dryness Cardiovascular  No chest pain, shortness of breath, or edema  Pulmonary  No cough or wheeze.  Gertie Fey  Intestinal  No nausea, vomitting, or diarrhoea. No bright red blood per rectum, no abdominal pain, change in bowel movement, or constipation.  Genito Urinary  No frequency, urgency, dysuria, no bleeding Musculo Skeletal  No myalgia, arthralgia, joint swelling or pain  Neurologic  No weakness, numbness, change in gait,  Psychology  No depression, anxiety, insomnia.   Vitals:  Blood pressure 112/60, pulse 60, temperature 98 F (36.7 C), temperature source Oral, resp. rate 18, weight 177 lb 6.4 oz (80.5 kg).  Physical Exam: WD in NAD Neck  Supple NROM, without any enlargements.  Lymph Node Survey No cervical supraclavicular or inguinal adenopathy Cardiovascular  Pulse normal rate, regularity and rhythm. S1 and S2 normal.  Lungs  Clear to auscultation bilateraly, without wheezes/crackles/rhonchi. Good air movement.  Skin  No rash/lesions/breakdown  Psychiatry  Alert and oriented to person, place, and time  Abdomen   Normoactive bowel sounds, abdomen soft, non-tender and nonobese without evidence of hernia.  Back No CVA tenderness Genito Urinary  Vulva/vagina: Normal external female genitalia.  No lesions. No discharge or bleeding.  Bladder/urethra:  No lesions or masses, well supported bladder  Vagina: normal  Cervix: Normal appearing, no lesions.  Uterus:  Small, mobile, no parametrial involvement or nodularity.  Adnexa: no palpable masses. Rectal  denied  Extremities  No bilateral cyanosis, clubbing or edema.   Donaciano Eva, MD  05/10/2016, 3:26 PM

## 2016-05-11 ENCOUNTER — Ambulatory Visit
Admission: RE | Admit: 2016-05-11 | Discharge: 2016-05-11 | Disposition: A | Discharge status: Home / Self Care | Payer: 59 | Source: Ambulatory Visit | Attending: Radiation Oncology | Admitting: Radiation Oncology

## 2016-05-11 DIAGNOSIS — Z79899 Other long term (current) drug therapy: Secondary | ICD-10-CM | POA: Diagnosis not present

## 2016-05-11 DIAGNOSIS — C50411 Malignant neoplasm of upper-outer quadrant of right female breast: Secondary | ICD-10-CM | POA: Diagnosis not present

## 2016-05-12 ENCOUNTER — Ambulatory Visit
Admission: RE | Admit: 2016-05-12 | Discharge: 2016-05-12 | Disposition: A | Discharge status: Home / Self Care | Payer: 59 | Source: Ambulatory Visit | Attending: Radiation Oncology | Admitting: Radiation Oncology

## 2016-05-12 DIAGNOSIS — C50411 Malignant neoplasm of upper-outer quadrant of right female breast: Secondary | ICD-10-CM | POA: Diagnosis not present

## 2016-05-12 DIAGNOSIS — Z79899 Other long term (current) drug therapy: Secondary | ICD-10-CM | POA: Diagnosis not present

## 2016-05-13 ENCOUNTER — Ambulatory Visit
Admission: RE | Admit: 2016-05-13 | Discharge: 2016-05-13 | Disposition: A | Discharge status: Home / Self Care | Payer: 59 | Source: Ambulatory Visit | Attending: Radiation Oncology | Admitting: Radiation Oncology

## 2016-05-13 DIAGNOSIS — C50411 Malignant neoplasm of upper-outer quadrant of right female breast: Secondary | ICD-10-CM | POA: Diagnosis not present

## 2016-05-13 DIAGNOSIS — Z79899 Other long term (current) drug therapy: Secondary | ICD-10-CM | POA: Diagnosis not present

## 2016-05-14 ENCOUNTER — Ambulatory Visit
Admission: RE | Admit: 2016-05-14 | Discharge: 2016-05-14 | Disposition: A | Discharge status: Home / Self Care | Payer: 59 | Source: Ambulatory Visit | Attending: Radiation Oncology | Admitting: Radiation Oncology

## 2016-05-14 ENCOUNTER — Encounter: Payer: Self-pay | Admitting: *Deleted

## 2016-05-14 DIAGNOSIS — C50411 Malignant neoplasm of upper-outer quadrant of right female breast: Secondary | ICD-10-CM | POA: Diagnosis not present

## 2016-05-14 DIAGNOSIS — Z79899 Other long term (current) drug therapy: Secondary | ICD-10-CM | POA: Diagnosis not present

## 2016-05-14 MED ORDER — SONAFINE EX EMUL
1.0000 "application " | Freq: Two times a day (BID) | CUTANEOUS | Status: DC
  Administered 2016-05-14: 1 via TOPICAL

## 2016-05-17 ENCOUNTER — Ambulatory Visit
Admission: RE | Admit: 2016-05-17 | Discharge: 2016-05-17 | Disposition: A | Discharge status: Home / Self Care | Payer: 59 | Source: Ambulatory Visit | Attending: Radiation Oncology | Admitting: Radiation Oncology

## 2016-05-17 DIAGNOSIS — Z79899 Other long term (current) drug therapy: Secondary | ICD-10-CM | POA: Diagnosis not present

## 2016-05-17 DIAGNOSIS — C50411 Malignant neoplasm of upper-outer quadrant of right female breast: Secondary | ICD-10-CM | POA: Diagnosis not present

## 2016-05-18 ENCOUNTER — Ambulatory Visit
Admission: RE | Admit: 2016-05-18 | Discharge: 2016-05-18 | Disposition: A | Discharge status: Home / Self Care | Payer: 59 | Source: Ambulatory Visit | Attending: Radiation Oncology | Admitting: Radiation Oncology

## 2016-05-18 DIAGNOSIS — Z79899 Other long term (current) drug therapy: Secondary | ICD-10-CM | POA: Diagnosis not present

## 2016-05-18 DIAGNOSIS — C50411 Malignant neoplasm of upper-outer quadrant of right female breast: Secondary | ICD-10-CM | POA: Diagnosis not present

## 2016-05-19 ENCOUNTER — Ambulatory Visit
Admission: RE | Admit: 2016-05-19 | Discharge: 2016-05-19 | Disposition: A | Discharge status: Home / Self Care | Payer: 59 | Source: Ambulatory Visit | Attending: Radiation Oncology | Admitting: Radiation Oncology

## 2016-05-19 DIAGNOSIS — C50411 Malignant neoplasm of upper-outer quadrant of right female breast: Secondary | ICD-10-CM | POA: Diagnosis not present

## 2016-05-19 DIAGNOSIS — M1711 Unilateral primary osteoarthritis, right knee: Secondary | ICD-10-CM | POA: Diagnosis not present

## 2016-05-19 DIAGNOSIS — Z79899 Other long term (current) drug therapy: Secondary | ICD-10-CM | POA: Diagnosis not present

## 2016-05-20 ENCOUNTER — Ambulatory Visit
Admission: RE | Admit: 2016-05-20 | Discharge: 2016-05-20 | Disposition: A | Discharge status: Home / Self Care | Payer: 59 | Source: Ambulatory Visit | Attending: Radiation Oncology | Admitting: Radiation Oncology

## 2016-05-20 DIAGNOSIS — C50411 Malignant neoplasm of upper-outer quadrant of right female breast: Secondary | ICD-10-CM | POA: Diagnosis not present

## 2016-05-20 DIAGNOSIS — Z79899 Other long term (current) drug therapy: Secondary | ICD-10-CM | POA: Diagnosis not present

## 2016-05-21 ENCOUNTER — Ambulatory Visit
Admission: RE | Admit: 2016-05-21 | Discharge: 2016-05-21 | Disposition: A | Discharge status: Home / Self Care | Payer: 59 | Source: Ambulatory Visit | Attending: Radiation Oncology | Admitting: Radiation Oncology

## 2016-05-21 DIAGNOSIS — C50411 Malignant neoplasm of upper-outer quadrant of right female breast: Secondary | ICD-10-CM | POA: Diagnosis not present

## 2016-05-21 DIAGNOSIS — Z79899 Other long term (current) drug therapy: Secondary | ICD-10-CM | POA: Diagnosis not present

## 2016-05-21 MED ORDER — RADIAPLEXRX EX GEL
Freq: Once | CUTANEOUS | Status: AC
  Administered 2016-05-21: 11:00:00 via TOPICAL

## 2016-05-24 ENCOUNTER — Ambulatory Visit: Payer: 59 | Admitting: Radiation Oncology

## 2016-05-24 ENCOUNTER — Ambulatory Visit
Admission: RE | Admit: 2016-05-24 | Discharge: 2016-05-24 | Disposition: A | Discharge status: Home / Self Care | Payer: 59 | Source: Ambulatory Visit | Attending: Radiation Oncology | Admitting: Radiation Oncology

## 2016-05-24 DIAGNOSIS — C50411 Malignant neoplasm of upper-outer quadrant of right female breast: Secondary | ICD-10-CM | POA: Diagnosis not present

## 2016-05-24 DIAGNOSIS — Z79899 Other long term (current) drug therapy: Secondary | ICD-10-CM | POA: Diagnosis not present

## 2016-05-25 ENCOUNTER — Ambulatory Visit
Admission: RE | Admit: 2016-05-25 | Discharge: 2016-05-25 | Disposition: A | Discharge status: Home / Self Care | Payer: 59 | Source: Ambulatory Visit | Attending: Radiation Oncology | Admitting: Radiation Oncology

## 2016-05-25 DIAGNOSIS — C50411 Malignant neoplasm of upper-outer quadrant of right female breast: Secondary | ICD-10-CM | POA: Diagnosis not present

## 2016-05-25 DIAGNOSIS — Z79899 Other long term (current) drug therapy: Secondary | ICD-10-CM | POA: Diagnosis not present

## 2016-05-26 ENCOUNTER — Ambulatory Visit
Admission: RE | Admit: 2016-05-26 | Discharge: 2016-05-26 | Disposition: A | Discharge status: Home / Self Care | Payer: 59 | Source: Ambulatory Visit | Attending: Radiation Oncology | Admitting: Radiation Oncology

## 2016-05-26 DIAGNOSIS — C50411 Malignant neoplasm of upper-outer quadrant of right female breast: Secondary | ICD-10-CM | POA: Diagnosis not present

## 2016-05-26 DIAGNOSIS — Z79899 Other long term (current) drug therapy: Secondary | ICD-10-CM | POA: Diagnosis not present

## 2016-05-27 ENCOUNTER — Ambulatory Visit
Admission: RE | Admit: 2016-05-27 | Discharge: 2016-05-27 | Disposition: A | Discharge status: Home / Self Care | Payer: 59 | Source: Ambulatory Visit | Attending: Radiation Oncology | Admitting: Radiation Oncology

## 2016-05-27 DIAGNOSIS — C50411 Malignant neoplasm of upper-outer quadrant of right female breast: Secondary | ICD-10-CM | POA: Diagnosis not present

## 2016-05-27 DIAGNOSIS — Z79899 Other long term (current) drug therapy: Secondary | ICD-10-CM | POA: Diagnosis not present

## 2016-05-28 ENCOUNTER — Ambulatory Visit
Admission: RE | Admit: 2016-05-28 | Discharge: 2016-05-28 | Disposition: A | Discharge status: Home / Self Care | Payer: 59 | Source: Ambulatory Visit | Attending: Radiation Oncology | Admitting: Radiation Oncology

## 2016-05-28 DIAGNOSIS — C50411 Malignant neoplasm of upper-outer quadrant of right female breast: Secondary | ICD-10-CM | POA: Diagnosis not present

## 2016-05-28 DIAGNOSIS — Z79899 Other long term (current) drug therapy: Secondary | ICD-10-CM | POA: Diagnosis not present

## 2016-06-01 ENCOUNTER — Ambulatory Visit
Admission: RE | Admit: 2016-06-01 | Discharge: 2016-06-01 | Disposition: A | Discharge status: Home / Self Care | Payer: 59 | Source: Ambulatory Visit | Attending: Radiation Oncology | Admitting: Radiation Oncology

## 2016-06-01 DIAGNOSIS — Z79899 Other long term (current) drug therapy: Secondary | ICD-10-CM | POA: Diagnosis not present

## 2016-06-01 DIAGNOSIS — C50411 Malignant neoplasm of upper-outer quadrant of right female breast: Secondary | ICD-10-CM | POA: Diagnosis not present

## 2016-06-02 ENCOUNTER — Ambulatory Visit
Admission: RE | Admit: 2016-06-02 | Discharge: 2016-06-02 | Disposition: A | Discharge status: Home / Self Care | Payer: 59 | Source: Ambulatory Visit | Attending: Radiation Oncology | Admitting: Radiation Oncology

## 2016-06-02 DIAGNOSIS — Z79899 Other long term (current) drug therapy: Secondary | ICD-10-CM | POA: Diagnosis not present

## 2016-06-02 DIAGNOSIS — C50411 Malignant neoplasm of upper-outer quadrant of right female breast: Secondary | ICD-10-CM | POA: Diagnosis not present

## 2016-06-03 ENCOUNTER — Ambulatory Visit
Admission: RE | Admit: 2016-06-03 | Discharge: 2016-06-03 | Disposition: A | Discharge status: Home / Self Care | Payer: 59 | Source: Ambulatory Visit | Attending: Radiation Oncology | Admitting: Radiation Oncology

## 2016-06-03 DIAGNOSIS — C50411 Malignant neoplasm of upper-outer quadrant of right female breast: Secondary | ICD-10-CM | POA: Diagnosis not present

## 2016-06-03 DIAGNOSIS — Z79899 Other long term (current) drug therapy: Secondary | ICD-10-CM | POA: Diagnosis not present

## 2016-06-03 NOTE — Patient Instructions (Addendum)
Melanie Barber  06/03/2016   Your procedure is scheduled on: 06-22-16  Report to [facility] Main  Entrance Take [place] Elevators to 3rd floor to [place] at 07:00 AM.    Call this number if you have problems the morning of surgery [phone]    Remember: ONLY 1 PERSON MAY GO WITH YOU TO SHORT STAY TO GET  READY MORNING OF YOUR SURGERY.  Do not eat food or drink liquids :After Midnight.     Take these medicines the morning of surgery with A SIP OF WATER: None                               You may not have any metal on your body including hair pins and              piercings  Do not wear jewelry, make-up, lotions, powders or perfumes, deodorant             Do not wear nail polish.  Do not shave  48 hours prior to surgery.                Do not bring valuables to the hospital. [place].  Contacts, dentures or bridgework may not be worn into surgery.  Leave suitcase in the car. After surgery it may be brought to your room.     [facility] - Preparing for Surgery Before surgery, you can play an important role.  Because skin is not sterile, your skin needs to be as free of germs as possible.  You can reduce the number of germs on your skin by washing with CHG (chlorahexidine gluconate) soap before surgery.  CHG is an antiseptic cleaner which kills germs and bonds with the skin to continue killing germs even after washing. Please DO NOT use if you have an allergy to CHG or antibacterial soaps.  If your skin becomes reddened/irritated stop using the CHG and inform your nurse when you arrive at Short Stay. Do not shave (including legs and underarms) for at least 48 hours prior to the first CHG shower.  You may shave your face/neck. Please follow these instructions carefully:  1.  Shower with CHG Soap the night before surgery and the  morning of Surgery.  2.  If you choose to wash your hair, wash your hair  first as usual with your  normal  shampoo.  3.  After you shampoo, rinse your hair and body thoroughly to remove the  shampoo.                           4.  Use CHG as you would any other liquid soap.  You can apply chg directly  to the skin and wash                       Gently with a scrungie or clean washcloth.  5.  Apply the CHG Soap to your body ONLY FROM THE NECK DOWN.   Do not use on face/ open  Wound or open sores. Avoid contact with eyes, ears mouth and genitals (private parts).                       Wash face,  Genitals (private parts) with your normal soap.             6.  Wash thoroughly, paying special attention to the area where your surgery  will be performed.  7.  Thoroughly rinse your body with warm water from the neck down.  8.  DO NOT shower/wash with your normal soap after using and rinsing off  the CHG Soap.                9.  Pat yourself dry with a clean towel.            10.  Wear clean pajamas.            11.  Place clean sheets on your bed the night of your first shower and do not  sleep with pets. Day of Surgery : Do not apply any lotions/deodorants the morning of surgery.  Please wear clean clothes to the hospital/surgery center.  FAILURE TO FOLLOW THESE INSTRUCTIONS MAY RESULT IN THE CANCELLATION OF YOUR SURGERY PATIENT SIGNATURE_________________________________  NURSE SIGNATURE__________________________________  ________________________________________________________________________   Melanie Barber  An incentive spirometer is a tool that can help keep your lungs clear and active. This tool measures how well you are filling your lungs with each breath. Taking long deep breaths may help reverse or decrease the chance of developing breathing (pulmonary) problems (especially infection) following:  A long period of time when you are unable to move or be active. BEFORE THE PROCEDURE   If the spirometer includes an indicator to  show your best effort, your nurse or respiratory therapist will set it to a desired goal.  If possible, sit up straight or lean slightly forward. Try not to slouch.  Hold the incentive spirometer in an upright position. INSTRUCTIONS FOR USE  1. Sit on the edge of your bed if possible, or sit up as far as you can in bed or on a chair. 2. Hold the incentive spirometer in an upright position. 3. Breathe out normally. 4. Place the mouthpiece in your mouth and seal your lips tightly around it. 5. Breathe in slowly and as deeply as possible, raising the piston or the ball toward the top of the column. 6. Hold your breath for 3-5 seconds or for as long as possible. Allow the piston or ball to fall to the bottom of the column. 7. Remove the mouthpiece from your mouth and breathe out normally. 8. Rest for a few seconds and repeat Steps 1 through 7 at least 10 times every 1-2 hours when you are awake. Take your time and take a few normal breaths between deep breaths. 9. The spirometer may include an indicator to show your best effort. Use the indicator as a goal to work toward during each repetition. 10. After each set of 10 deep breaths, practice coughing to be sure your lungs are clear. If you have an incision (the cut made at the time of surgery), support your incision when coughing by placing a pillow or rolled up towels firmly against it. Once you are able to get out of bed, walk around indoors and cough well. You may stop using the incentive spirometer when instructed by your caregiver.  RISKS AND COMPLICATIONS  Take your time so you do not get  dizzy or light-headed.  If you are in pain, you may need to take or ask for pain medication before doing incentive spirometry. It is harder to take a deep breath if you are having pain. AFTER USE  Rest and breathe slowly and easily.  It can be helpful to keep track of a log of your progress. Your caregiver can provide you with a simple table to help with  this. If you are using the spirometer at home, follow these instructions: Melanie Barber IF:   You are having difficultly using the spirometer.  You have trouble using the spirometer as often as instructed.  Your pain medication is not giving enough relief while using the spirometer.  You develop fever of 100.5 F (38.1 C) or higher. SEEK IMMEDIATE MEDICAL CARE IF:   You cough up bloody sputum that had not been present before.  You develop fever of 102 F (38.9 C) or greater.  You develop worsening pain at or near the incision site. MAKE SURE YOU:   Understand these instructions.  Will watch your condition.  Will get help right away if you are not doing well or get worse. Document Released: 05/03/2006 Document Revised: 03/15/2011 Document Reviewed: 07/04/2006 ExitCare Patient Information 2014 ExitCare, [place].   ________________________________________________________________________  WHAT IS A BLOOD TRANSFUSION? Blood Transfusion Information  A transfusion is the replacement of blood or some of its parts. Blood is made up of multiple cells which provide different functions.  Red blood cells carry oxygen and are used for blood loss replacement.  White blood cells fight against infection.  Platelets control bleeding.  Plasma helps clot blood.  Other blood products are available for specialized needs, such as hemophilia or other clotting disorders. BEFORE THE TRANSFUSION  Who gives blood for transfusions?   Healthy volunteers who are fully evaluated to make sure their blood is safe. This is blood bank blood. Transfusion therapy is the safest it has ever been in the practice of medicine. Before blood is taken from a donor, a complete history is taken to make sure that person has no history of diseases nor engages in risky social behavior (examples are intravenous drug use or sexual activity with multiple partners). The donor's travel history is screened to minimize  risk of transmitting infections, such as malaria. The donated blood is tested for signs of infectious diseases, such as HIV and hepatitis. The blood is then tested to be sure it is compatible with you in order to minimize the chance of a transfusion reaction. If you or a relative donates blood, this is often done in anticipation of surgery and is not appropriate for emergency situations. It takes many days to process the donated blood. RISKS AND COMPLICATIONS Although transfusion therapy is very safe and saves many lives, the main dangers of transfusion include:   Getting an infectious disease.  Developing a transfusion reaction. This is an allergic reaction to something in the blood you were given. Every precaution is taken to prevent this. The decision to have a blood transfusion has been considered carefully by your caregiver before blood is given. Blood is not given unless the benefits outweigh the risks. AFTER THE TRANSFUSION  Right after receiving a blood transfusion, you will usually feel much better and more energetic. This is especially true if your red blood cells have gotten low (anemic). The transfusion raises the level of the red blood cells which carry oxygen, and this usually causes an energy increase.  The nurse administering the transfusion will  monitor you carefully for complications. HOME CARE INSTRUCTIONS  No special instructions are needed after a transfusion. You may find your energy is better. Speak with your caregiver about any limitations on activity for underlying diseases you may have. SEEK MEDICAL CARE IF:   Your condition is not improving after your transfusion.  You develop redness or irritation at the intravenous (IV) site. SEEK IMMEDIATE MEDICAL CARE IF:  Any of the following symptoms occur over the next 12 hours:  Shaking chills.  You have a temperature by mouth above 102 F (38.9 C), not controlled by medicine.  Chest, back, or muscle pain.  People  around you feel you are not acting correctly or are confused.  Shortness of breath or difficulty breathing.  Dizziness and fainting.  You get a rash or develop hives.  You have a decrease in urine output.  Your urine turns a dark color or changes to pink, red, or brown. Any of the following symptoms occur over the next 10 days:  You have a temperature by mouth above 102 F (38.9 C), not controlled by medicine.  Shortness of breath.  Weakness after normal activity.  The white part of the eye turns yellow (jaundice).  You have a decrease in the amount of urine or are urinating less often.  Your urine turns a dark color or changes to pink, red, or brown. Document Released: 12/19/1999 Document Revised: 03/15/2011 Document Reviewed: 08/07/2007 ExitCare Patient Information 2014 ExitCare, [place].  _______________________________________________________________________Please read over the following fact sheets you were given: _____________________________________________________________________

## 2016-06-03 NOTE — Progress Notes (Signed)
02-26-16 (EPIC) ECHO 09-26-15 (EPIC) EKG 10-01-15 (EPIC)CXR

## 2016-06-04 ENCOUNTER — Ambulatory Visit
Admission: RE | Admit: 2016-06-04 | Discharge: 2016-06-04 | Disposition: A | Discharge status: Home / Self Care | Payer: 59 | Source: Ambulatory Visit | Attending: Radiation Oncology | Admitting: Radiation Oncology

## 2016-06-04 ENCOUNTER — Encounter (HOSPITAL_COMMUNITY): Payer: Self-pay

## 2016-06-04 ENCOUNTER — Encounter (HOSPITAL_COMMUNITY)
Admission: RE | Admit: 2016-06-04 | Discharge: 2016-06-04 | Disposition: A | Discharge status: Home / Self Care | Payer: 59 | Source: Ambulatory Visit | Attending: Gynecologic Oncology | Admitting: Gynecologic Oncology

## 2016-06-04 DIAGNOSIS — Z01818 Encounter for other preprocedural examination: Secondary | ICD-10-CM | POA: Insufficient documentation

## 2016-06-04 DIAGNOSIS — C50411 Malignant neoplasm of upper-outer quadrant of right female breast: Secondary | ICD-10-CM | POA: Diagnosis not present

## 2016-06-04 DIAGNOSIS — C50919 Malignant neoplasm of unspecified site of unspecified female breast: Secondary | ICD-10-CM | POA: Insufficient documentation

## 2016-06-04 DIAGNOSIS — Z170 Estrogen receptor positive status [ER+]: Secondary | ICD-10-CM | POA: Insufficient documentation

## 2016-06-04 DIAGNOSIS — Z79899 Other long term (current) drug therapy: Secondary | ICD-10-CM | POA: Diagnosis not present

## 2016-06-04 LAB — URINALYSIS, ROUTINE W REFLEX MICROSCOPIC
Bilirubin Urine: NEGATIVE
Glucose, UA: NEGATIVE mg/dL
Hgb urine dipstick: NEGATIVE
Ketones, ur: NEGATIVE mg/dL
Nitrite: NEGATIVE
PH: 5 (ref 5.0–8.0)
Protein, ur: NEGATIVE mg/dL
SPECIFIC GRAVITY, URINE: 1.018 (ref 1.005–1.030)

## 2016-06-04 LAB — CBC WITH DIFFERENTIAL/PLATELET
BASOS ABS: 0 10*3/uL (ref 0.0–0.1)
Basophils Relative: 1 %
Eosinophils Absolute: 0.1 10*3/uL (ref 0.0–0.7)
Eosinophils Relative: 2 %
HEMATOCRIT: 36.2 % (ref 36.0–46.0)
Hemoglobin: 12.3 g/dL (ref 12.0–15.0)
LYMPHS PCT: 16 %
Lymphs Abs: 0.6 10*3/uL — ABNORMAL LOW (ref 0.7–4.0)
MCH: 30.8 pg (ref 26.0–34.0)
MCHC: 34 g/dL (ref 30.0–36.0)
MCV: 90.5 fL (ref 78.0–100.0)
MONO ABS: 0.3 10*3/uL (ref 0.1–1.0)
Monocytes Relative: 7 %
NEUTROS ABS: 2.8 10*3/uL (ref 1.7–7.7)
Neutrophils Relative %: 74 %
Platelets: 139 10*3/uL — ABNORMAL LOW (ref 150–400)
RBC: 4 MIL/uL (ref 3.87–5.11)
RDW: 13.1 % (ref 11.5–15.5)
WBC: 3.7 10*3/uL — ABNORMAL LOW (ref 4.0–10.5)

## 2016-06-04 LAB — COMPREHENSIVE METABOLIC PANEL
ALT: 27 U/L (ref 14–54)
AST: 26 U/L (ref 15–41)
Albumin: 4.3 g/dL (ref 3.5–5.0)
Alkaline Phosphatase: 78 U/L (ref 38–126)
Anion gap: 6 (ref 5–15)
BILIRUBIN TOTAL: 1 mg/dL (ref 0.3–1.2)
BUN: 13 mg/dL (ref 6–20)
CALCIUM: 9.3 mg/dL (ref 8.9–10.3)
CO2: 28 mmol/L (ref 22–32)
CREATININE: 0.63 mg/dL (ref 0.44–1.00)
Chloride: 105 mmol/L (ref 101–111)
GFR calc Af Amer: >60 mL/min (ref >60)
Glucose, Bld: 88 mg/dL (ref 65–99)
POTASSIUM: 3.7 mmol/L (ref 3.5–5.1)
Sodium: 139 mmol/L (ref 135–145)
TOTAL PROTEIN: 7.2 g/dL (ref 6.5–8.1)

## 2016-06-04 LAB — PREGNANCY, URINE: PREG TEST UR: NEGATIVE

## 2016-06-07 ENCOUNTER — Ambulatory Visit
Admission: RE | Admit: 2016-06-07 | Discharge: 2016-06-07 | Disposition: A | Discharge status: Home / Self Care | Payer: 59 | Source: Ambulatory Visit | Attending: Radiation Oncology | Admitting: Radiation Oncology

## 2016-06-07 ENCOUNTER — Ambulatory Visit: Payer: 59

## 2016-06-07 DIAGNOSIS — Z79899 Other long term (current) drug therapy: Secondary | ICD-10-CM | POA: Diagnosis not present

## 2016-06-07 DIAGNOSIS — C50411 Malignant neoplasm of upper-outer quadrant of right female breast: Secondary | ICD-10-CM | POA: Diagnosis not present

## 2016-06-07 NOTE — Progress Notes (Signed)
06-04-16 UA result, routed to Dr. Denman George for review.

## 2016-06-08 ENCOUNTER — Encounter: Payer: Self-pay | Admitting: Radiation Oncology

## 2016-06-08 ENCOUNTER — Encounter: Payer: Self-pay | Admitting: *Deleted

## 2016-06-08 ENCOUNTER — Ambulatory Visit: Payer: 59

## 2016-06-08 ENCOUNTER — Ambulatory Visit
Admission: RE | Admit: 2016-06-08 | Discharge: 2016-06-08 | Disposition: A | Discharge status: Home / Self Care | Payer: 59 | Source: Ambulatory Visit | Attending: Radiation Oncology | Admitting: Radiation Oncology

## 2016-06-08 DIAGNOSIS — C50411 Malignant neoplasm of upper-outer quadrant of right female breast: Secondary | ICD-10-CM | POA: Diagnosis not present

## 2016-06-08 DIAGNOSIS — Z79899 Other long term (current) drug therapy: Secondary | ICD-10-CM | POA: Diagnosis not present

## 2016-06-09 ENCOUNTER — Ambulatory Visit: Payer: 59

## 2016-06-10 ENCOUNTER — Ambulatory Visit: Payer: 59

## 2016-06-11 ENCOUNTER — Ambulatory Visit: Payer: 59

## 2016-06-14 ENCOUNTER — Ambulatory Visit (INDEPENDENT_AMBULATORY_CARE_PROVIDER_SITE_OTHER): Payer: 59

## 2016-06-14 ENCOUNTER — Ambulatory Visit (HOSPITAL_COMMUNITY)
Admission: EM | Admit: 2016-06-14 | Discharge: 2016-06-14 | Disposition: A | Discharge status: Home / Self Care | Payer: 59 | Attending: Internal Medicine | Admitting: Internal Medicine

## 2016-06-14 ENCOUNTER — Encounter (HOSPITAL_COMMUNITY): Payer: Self-pay | Admitting: Emergency Medicine

## 2016-06-14 DIAGNOSIS — M545 Low back pain: Secondary | ICD-10-CM | POA: Diagnosis not present

## 2016-06-14 DIAGNOSIS — M5432 Sciatica, left side: Secondary | ICD-10-CM

## 2016-06-14 MED ORDER — METHYLPREDNISOLONE SODIUM SUCC 125 MG IJ SOLR: 80.0000 mg | Freq: Once | INTRAMUSCULAR | Status: DC

## 2016-06-14 MED ORDER — METHYLPREDNISOLONE 4 MG PO TBPK: ORAL_TABLET | ORAL | 0 refills | Status: DC

## 2016-06-14 MED ORDER — CYCLOBENZAPRINE HCL 10 MG PO TABS: 10.0000 mg | ORAL_TABLET | Freq: Two times a day (BID) | ORAL | 0 refills | Status: DC | PRN

## 2016-06-14 MED ORDER — METHYLPREDNISOLONE ACETATE 80 MG/ML IJ SUSP
80.0000 mg | Freq: Once | INTRAMUSCULAR | Status: AC
  Administered 2016-06-14: 80 mg via INTRAMUSCULAR

## 2016-06-14 MED ORDER — METHYLPREDNISOLONE ACETATE 80 MG/ML IJ SUSP
INTRAMUSCULAR | Status: AC
  Filled 2016-06-14: qty 1

## 2016-06-14 MED ORDER — KETOROLAC TROMETHAMINE 30 MG/ML IJ SOLN
INTRAMUSCULAR | Status: AC
  Filled 2016-06-14: qty 1

## 2016-06-14 MED ORDER — KETOROLAC TROMETHAMINE 30 MG/ML IJ SOLN
30.0000 mg | Freq: Once | INTRAMUSCULAR | Status: AC
  Administered 2016-06-14: 30 mg via INTRAMUSCULAR

## 2016-06-14 NOTE — ED Triage Notes (Signed)
Patient has back pain and leg pain.  This episode started today.  Patient was getting out of car.  Sudden onset of symptoms.  No bowel or bladder issues.

## 2016-06-14 NOTE — ED Provider Notes (Signed)
CSN: [id number]     Arrival date & time 06/14/16  1447 History   First MD Initiated Contact with Patient 06/14/16 1652     Chief Complaint  Patient presents with  . Back Pain   (Consider location/radiation/quality/duration/timing/severity/associated sxs/prior Treatment) Patient c/o left back pain radiating down left leg.  She has had this 2 other occasions.    Back Pain  Location:  Lumbar spine Quality:  Aching Radiates to:  L knee Pain severity:  Moderate Pain is:  Worse during the day Onset quality:  Sudden Duration:  1 day Timing:  Constant Chronicity:  New Relieved by:  Nothing Worsened by:  Nothing   Past Medical History:  Diagnosis Date  . Arthritis    right knee (10/01/2015)  . Cancer of right breast ([place])   . PONV (postoperative nausea and vomiting)   . Sciatic leg pain    left leg   Past Surgical History:  Procedure Laterality Date  . BREAST BIOPSY Right   . BREAST CYST ASPIRATION Right   . BREAST RECONSTRUCTION WITH PLACEMENT OF TISSUE EXPANDER AND FLEX HD (ACELLULAR HYDRATED DERMIS) Bilateral 10/01/2015  . BREAST RECONSTRUCTION WITH PLACEMENT OF TISSUE EXPANDER AND FLEX HD (ACELLULAR HYDRATED DERMIS) Bilateral 10/01/2015   Procedure: BREAST RECONSTRUCTION WITH PLACEMENT OF TISSUE EXPANDER AND CORTIVA;  Surgeon: Irene Limbo, MD;  Location: [place];  Service: Plastics;  Laterality: Bilateral;  . [place]   growth removed  . MASTECTOMY Left 10/01/2015   NIPPLE SPARING  . MASTECTOMY COMPLETE / SIMPLE W/ SENTINEL NODE BIOPSY Right 10/01/2015   NIPPLE SPARING  . NASAL/SINUS ENDOSCOPY Bilateral 2005  . NIPPLE SPARING MASTECTOMY/SENTINAL LYMPH NODE BIOPSY/RECONSTRUCTION/PLACEMENT OF TISSUE EXPANDER Bilateral 10/01/2015   Procedure: BILATERAL NIPPLE SPARING MASTECTOMY WITH RIGHT SENTINAL LYMPH NODE BIOPSY;  Surgeon: Rolm Bookbinder, MD;  Location: [place];  Service: General;  Laterality: Bilateral;  . PORT-A-CATH REMOVAL Right 04/01/2016   Procedure:  MINOR REMOVAL PORT-A-CATH;  Surgeon: Rolm Bookbinder, MD;  Location: [place];  Service: General;  Laterality: Right;  . PORTACATH PLACEMENT Right 10/01/2015  . PORTACATH PLACEMENT Right 10/01/2015   Procedure: INSERTION PORT-A-CATH;  Surgeon: Rolm Bookbinder, MD;  Location: [place];  Service: General;  Laterality: Right;  . TOE SURGERY Left 1990   "bone spur on great toe"  . TONSILLECTOMY  1977  . ULTRASOUND GUIDANCE FOR VASCULAR ACCESS Right 10/01/2015   Procedure: ULTRASOUND GUIDANCE;  Surgeon: Rolm Bookbinder, MD;  Location: [place];  Service: General;  Laterality: Right;  . WISDOM TOOTH EXTRACTION     Family History  Problem Relation Age of Onset  . Depression Mother   . Inflammatory bowel disease Mother   . Alcohol abuse Mother   . Bipolar disorder Mother   . Hypertension Son   . Other Brother        severe intellectual disabilities  . Arthritis Maternal Uncle        hx knee replacement surgeries  . Stroke Maternal Grandmother 86  . Breast cancer Maternal Grandmother        dx before menopause  . Uterine cancer Maternal Grandmother        dx late 20s  . Bipolar disorder Maternal Grandfather   . Stroke Paternal Grandmother 56  . Diabetes Paternal Grandmother        later in life  . Heart attack Paternal Grandfather    Social History  Substance Use Topics  . Smoking status: Never Smoker  . Smokeless tobacco: Never Used  .  Alcohol use Yes     Comment: socially   OB History    No data available     Review of Systems  Constitutional: Negative.   HENT: Negative.   Eyes: Negative.   Respiratory: Negative.   Cardiovascular: Negative.   Gastrointestinal: Negative.   Endocrine: Negative.   Genitourinary: Negative.   Musculoskeletal: Positive for back pain.  Allergic/Immunologic: Negative.   Neurological: Negative.   Hematological: Negative.   Psychiatric/Behavioral: Negative.     Allergies  Keflex [cephalexin] and Decadron  [dexamethasone]  Home Medications   Prior to Admission medications   Medication Sig Start Date End Date Taking? Authorizing Provider  acetaminophen (TYLENOL) 500 MG tablet Take 1,000 mg by mouth 2 (two) times daily as needed for mild pain.     [provider]  Atorvastatin Calcium (INVESTIGATIONAL ATORVASTATIN/PLACEBO) 40 MG tablet [facility] [id number] Take 1 tablet by mouth daily. Take 2 doses (these doses must be 12 hours apart) prior to first chemotherapy treatment. Then take 1 tablet daily by mouth with or without food. Patient not taking: Reported on 06/02/2016 10/22/15   Nicholas Lose, MD  Atorvastatin Calcium (INVESTIGATIONAL ATORVASTATIN/PLACEBO) 40 MG tablet [facility] [phone] Take 1 tablet by mouth daily. Take 1 tablet daily with or without food. 04/27/16   Nicholas Lose, MD  Biotin (BIOTIN 5000) 5 MG CAPS Take 10 mg by mouth daily.     [provider]  cyclobenzaprine (FLEXERIL) 10 MG tablet Take 1 tablet (10 mg total) by mouth 2 (two) times daily as needed for muscle spasms. 06/14/16   Lysbeth Penner, FNP  Dermatological Products, Misc. (MIADERM RADIATION RELIEF) LOTN Apply 1 application topically at bedtime.    [provider]  hyaluronate sodium (RADIAPLEXRX) GEL Apply 1 application topically 2 (two) times daily.    [provider]  levonorgestrel (MIRENA) 20 MCG/24HR IUD 1 each by Intrauterine route once.    [provider]  methylPREDNISolone (MEDROL DOSEPAK) 4 MG TBPK tablet Take 6-5-4-3-2-1 po qd 06/14/16   Lysbeth Penner, FNP  mineral oil-hydrophilic petrolatum (AQUAPHOR) ointment Apply 1 application topically as needed for dry skin or irritation.    [provider]  naproxen sodium (ANAPROX) 220 MG tablet Take 220 mg by mouth daily as needed (pain).    [provider]  non-metallic deodorant Jethro Poling) MISC Apply 1 application topically daily.    [provider]  Prenatal Vit-Fe Fumarate-FA (PRENATAL  MULTIVITAMIN) TABS tablet Take 1 tablet by mouth daily at 12 noon.    [provider]  Probiotic Product (PRO-BIOTIC BLEND PO) Take 1 tablet by mouth 2 (two) times daily.     [provider]  PSYLLIUM HUSK PO Take 2 capsules by mouth daily.     [provider]   Meds Ordered and Administered this Visit   Medications  ketorolac (TORADOL) 30 MG/ML injection 30 mg (30 mg Intramuscular Given 06/14/16 1723)  methylPREDNISolone acetate (DEPO-MEDROL) injection 80 mg (80 mg Intramuscular Given 06/14/16 1727)    BP 123/80 (BP Location: Left Arm)   Pulse 83   Temp 97.7 F (36.5 C) (Oral)   Resp 18   SpO2 96%  No data found.   Physical Exam  Constitutional: She appears well-developed and well-nourished.  HENT:  Head: Normocephalic and atraumatic.  Eyes: Conjunctivae and EOM are normal. Pupils are equal, round, and reactive to light.  Neck: Normal range of motion. Neck supple.  Cardiovascular: Normal rate, regular rhythm and normal heart sounds.  Pulmonary/Chest: Effort normal and breath sounds normal.  Musculoskeletal: She exhibits tenderness.  TTP left lumbar spine  Nursing note and vitals reviewed.   Urgent Care Course     Procedures (including critical care time)  Labs Review Labs Reviewed - No data to display  Imaging Review Dg Lumbar Spine Complete  Result Date: 06/14/2016 CLINICAL DATA:  Low back pain today which shooting pain. History of sciatica. Pain along left-sided back. EXAM: LUMBAR SPINE - COMPLETE 4+ VIEW COMPARISON:  CT abdomen dated 09/18/2015. FINDINGS: Alignment of the lumbar spine is not significantly changed, with mild scoliosis possibly accentuated by patient positioning. No acute appearing osseous abnormality. No significant degenerative change at any level. Upper sacrum appears intact and normally aligned. Visualized paravertebral soft tissues are unremarkable. Intrauterine device within the central pelvis, slightly to the left of  midline. IMPRESSION: 1. No acute findings. 2. Mild scoliosis. Electronically Signed   By: Franki Cabot M.D.   On: 06/14/2016 17:48     Visual Acuity Review  Right Eye Distance:   Left Eye Distance:   Bilateral Distance:    Right Eye Near:   Left Eye Near:    Bilateral Near:         MDM   1. Sciatica of left side    Toradol 60 mg IM Medrol dose pack as directed 4mg  #21 Flexeril 10mg  one po bid prn #20     Lysbeth Penner, FNP 06/14/16 2056

## 2016-06-16 ENCOUNTER — Telehealth: Payer: Self-pay | Admitting: *Deleted

## 2016-06-16 NOTE — Telephone Encounter (Signed)
CALLED PATIENT TO INFORM OF ONE MONTH FU APPT. FOR 08-11-16 @ 9:30 AM WITH ALISON PERKINS, LVM FOR A RETURN CALL

## 2016-06-17 ENCOUNTER — Telehealth: Payer: Self-pay | Admitting: Gynecologic Oncology

## 2016-06-17 ENCOUNTER — Ambulatory Visit (INDEPENDENT_AMBULATORY_CARE_PROVIDER_SITE_OTHER): Payer: 59 | Admitting: Sports Medicine

## 2016-06-17 DIAGNOSIS — B351 Tinea unguium: Secondary | ICD-10-CM

## 2016-06-17 DIAGNOSIS — M79675 Pain in left toe(s): Secondary | ICD-10-CM | POA: Diagnosis not present

## 2016-06-17 DIAGNOSIS — M79674 Pain in right toe(s): Secondary | ICD-10-CM

## 2016-06-17 NOTE — Telephone Encounter (Signed)
Patient called yesterday stating she is currently taking a steroid taper due to injury and wanted to make sure there would not be any contraindication with surgery.  Discussed with Dr. Denman George and patient is able to complete taper which will end on Sunday.  No other concerns voiced.  Advised to call for any needs.

## 2016-06-17 NOTE — Progress Notes (Signed)
Subjective: Melanie Barber is a 51 y.o. female patient seen today in office with complaint of painful thickened and discolored nails. Patient is desiring treatment for nail changes; has tried self falling and trimming in the past with no improvement. Reports that nails are becoming difficult to manage because of the thickness. States that a lot of her nail changes happen after she was diagnosed with breast cancer and placed on chemotherapy reports that during this time frame. She also did a lot of exercise and jogging and experience blisters to toes. Patient has no other pedal complaints at this time.   Patient Active Problem List   Diagnosis Date Noted  . Genetic testing 11/17/2015  . Malignant neoplasm of upper-outer quadrant of right breast in female, estrogen receptor positive ([place]) 10/09/2015  . Breast cancer of upper-outer quadrant of right female breast ([place]) 09/12/2015    Current Outpatient Prescriptions on File Prior to Visit  Medication Sig Dispense Refill  . acetaminophen (TYLENOL) 500 MG tablet Take 1,000 mg by mouth 2 (two) times daily as needed for mild pain.     . Atorvastatin Calcium (INVESTIGATIONAL ATORVASTATIN/PLACEBO) 40 MG tablet [facility] [phone] Take 1 tablet by mouth daily. Take 1 tablet daily with or without food. 180 tablet 0  . Biotin (BIOTIN 5000) 5 MG CAPS Take 10 mg by mouth daily.     . cyclobenzaprine (FLEXERIL) 10 MG tablet Take 1 tablet (10 mg total) by mouth 2 (two) times daily as needed for muscle spasms. 20 tablet 0  . Dermatological Products, Misc. (MIADERM RADIATION RELIEF) LOTN Apply 1 application topically at bedtime.    . hyaluronate sodium (RADIAPLEXRX) GEL Apply 1 application topically 2 (two) times daily.    Marland Kitchen levonorgestrel (MIRENA) 20 MCG/24HR IUD 1 each by Intrauterine route once.    . methylPREDNISolone (MEDROL DOSEPAK) 4 MG TBPK tablet Take 6-5-4-3-2-1 po qd 21 tablet 0  . mineral oil-hydrophilic petrolatum (AQUAPHOR) ointment Apply 1  application topically as needed for dry skin or irritation.    . naproxen sodium (ANAPROX) 220 MG tablet Take 220 mg by mouth daily as needed (pain).    . non-metallic deodorant Jethro Poling) MISC Apply 1 application topically daily.    . Prenatal Vit-Fe Fumarate-FA (PRENATAL MULTIVITAMIN) TABS tablet Take 1 tablet by mouth daily at 12 noon.    . Probiotic Product (PRO-BIOTIC BLEND PO) Take 1 tablet by mouth 2 (two) times daily.     . PSYLLIUM HUSK PO Take 2 capsules by mouth daily.     . Atorvastatin Calcium (INVESTIGATIONAL ATORVASTATIN/PLACEBO) 40 MG tablet [facility] [phone] Take 1 tablet by mouth daily. Take 2 doses (these doses must be 12 hours apart) prior to first chemotherapy treatment. Then take 1 tablet daily by mouth with or without food. (Patient not taking: Reported on 06/02/2016) 180 tablet 0   No current facility-administered medications on file prior to visit.     Allergies  Allergen Reactions  . Keflex [Cephalexin] Anaphylaxis  . Decadron [Dexamethasone] Anxiety    Beyond hyper    Objective: Physical Exam  General: Well developed, nourished, no acute distress, awake, alert and oriented x 3  Vascular: Dorsalis pedis artery 2/4 bilateral, Posterior tibial artery 2/4 bilateral, skin temperature warm to warm proximal to distal bilateral lower extremities, no varicosities, pedal hair present bilateral.  Neurological: Gross sensation present via light touch bilateral.   Dermatological: Skin is warm, dry, and supple bilateral, Nails 1-10 are tender, short thick, and discolored with mild subungal debris,  no webspace macerations present bilateral, no open lesions present bilateral, no callus/corns/hyperkeratotic tissue present bilateral. No signs of infection bilateral.  Musculoskeletal: No boney deformities noted bilateral. Muscular strength within normal limits without painon range of motion. No pain with calf compression bilateral.  Assessment and Plan:  Problem List Items  Addressed This Visit    None    Visit Diagnoses    Dermatophytosis of nail    -  Primary   Relevant Orders   Fungus Culture with Smear   Toe pain, bilateral         -Examined patient -Discussed treatment options for painful dystrophic nails  -Fungal culture was obtainedFrom all nails and sent to [facility] lab -Meanwhile Encouraged patient to use Tea Tree Oil and vinegar soaks -Patient to return in 4 weeks for follow up evaluation and discussion of fungal culture results or sooner if symptoms worsen.  Landis Martins, DPM

## 2016-06-22 ENCOUNTER — Ambulatory Visit (HOSPITAL_COMMUNITY): Payer: 59 | Admitting: Anesthesiology

## 2016-06-22 ENCOUNTER — Telehealth: Payer: Self-pay | Admitting: *Deleted

## 2016-06-22 ENCOUNTER — Encounter (HOSPITAL_COMMUNITY): Payer: Self-pay | Admitting: Anesthesiology

## 2016-06-22 ENCOUNTER — Encounter (HOSPITAL_COMMUNITY)
Admission: RE | Disposition: A | Discharge status: Home / Self Care | Payer: Self-pay | Source: Ambulatory Visit | Attending: Gynecologic Oncology

## 2016-06-22 ENCOUNTER — Ambulatory Visit (HOSPITAL_COMMUNITY)
Admission: RE | Admit: 2016-06-22 | Discharge: 2016-06-23 | Disposition: A | Discharge status: Home / Self Care | Payer: 59 | Source: Ambulatory Visit | Attending: Gynecologic Oncology | Admitting: Gynecologic Oncology

## 2016-06-22 DIAGNOSIS — Z4002 Encounter for prophylactic removal of ovary: Secondary | ICD-10-CM | POA: Insufficient documentation

## 2016-06-22 DIAGNOSIS — Z9221 Personal history of antineoplastic chemotherapy: Secondary | ICD-10-CM | POA: Insufficient documentation

## 2016-06-22 DIAGNOSIS — N800 Endometriosis of uterus: Secondary | ICD-10-CM | POA: Diagnosis not present

## 2016-06-22 DIAGNOSIS — Z1379 Encounter for other screening for genetic and chromosomal anomalies: Secondary | ICD-10-CM

## 2016-06-22 DIAGNOSIS — Z9013 Acquired absence of bilateral breasts and nipples: Secondary | ICD-10-CM | POA: Diagnosis not present

## 2016-06-22 DIAGNOSIS — Z853 Personal history of malignant neoplasm of breast: Secondary | ICD-10-CM

## 2016-06-22 DIAGNOSIS — C50411 Malignant neoplasm of upper-outer quadrant of right female breast: Secondary | ICD-10-CM | POA: Insufficient documentation

## 2016-06-22 DIAGNOSIS — Z170 Estrogen receptor positive status [ER+]: Secondary | ICD-10-CM | POA: Diagnosis not present

## 2016-06-22 DIAGNOSIS — N72 Inflammatory disease of cervix uteri: Secondary | ICD-10-CM | POA: Insufficient documentation

## 2016-06-22 DIAGNOSIS — Z79899 Other long term (current) drug therapy: Secondary | ICD-10-CM | POA: Diagnosis not present

## 2016-06-22 HISTORY — PX: ROBOTIC ASSISTED TOTAL HYSTERECTOMY WITH BILATERAL SALPINGO OOPHERECTOMY: SHX6086

## 2016-06-22 LAB — TYPE AND SCREEN
ABO/RH(D): O POS
ANTIBODY SCREEN: NEGATIVE

## 2016-06-22 LAB — ABO/RH: ABO/RH(D): O POS

## 2016-06-22 SURGERY — HYSTERECTOMY, TOTAL, ROBOT-ASSISTED, LAPAROSCOPIC, WITH BILATERAL SALPINGO-OOPHORECTOMY
Anesthesia: General | Laterality: Bilateral

## 2016-06-22 MED ORDER — METOCLOPRAMIDE HCL 5 MG/ML IJ SOLN
INTRAMUSCULAR | Status: DC | PRN
  Administered 2016-06-22: 10 mg via INTRAVENOUS

## 2016-06-22 MED ORDER — FENTANYL CITRATE (PF) 100 MCG/2ML IJ SOLN
INTRAMUSCULAR | Status: AC
  Filled 2016-06-22: qty 2

## 2016-06-22 MED ORDER — SUGAMMADEX SODIUM 200 MG/2ML IV SOLN
INTRAVENOUS | Status: DC | PRN
  Administered 2016-06-22: 200 mg via INTRAVENOUS

## 2016-06-22 MED ORDER — METOCLOPRAMIDE HCL 5 MG/ML IJ SOLN: 10.0000 mg | Freq: Once | INTRAMUSCULAR | Status: DC | PRN

## 2016-06-22 MED ORDER — ONDANSETRON HCL 4 MG/2ML IJ SOLN: 4.0000 mg | Freq: Four times a day (QID) | INTRAMUSCULAR | Status: DC | PRN

## 2016-06-22 MED ORDER — FENTANYL CITRATE (PF) 250 MCG/5ML IJ SOLN
INTRAMUSCULAR | Status: AC
  Filled 2016-06-22: qty 5

## 2016-06-22 MED ORDER — ENOXAPARIN SODIUM 40 MG/0.4ML ~~LOC~~ SOLN
40.0000 mg | SUBCUTANEOUS | Status: AC
  Administered 2016-06-22: 40 mg via SUBCUTANEOUS
  Filled 2016-06-22: qty 0.4

## 2016-06-22 MED ORDER — IBUPROFEN 800 MG PO TABS: 800.0000 mg | ORAL_TABLET | Freq: Three times a day (TID) | ORAL | Status: DC | PRN

## 2016-06-22 MED ORDER — LIDOCAINE 2% (20 MG/ML) 5 ML SYRINGE
INTRAMUSCULAR | Status: DC | PRN
  Administered 2016-06-22: 100 mg via INTRAVENOUS

## 2016-06-22 MED ORDER — KCL IN DEXTROSE-NACL 20-5-0.45 MEQ/L-%-% IV SOLN
INTRAVENOUS | Status: DC
  Administered 2016-06-22 – 2016-06-23 (×2): via INTRAVENOUS
  Filled 2016-06-22 (×2): qty 1000

## 2016-06-22 MED ORDER — ONDANSETRON HCL 4 MG/2ML IJ SOLN
INTRAMUSCULAR | Status: AC
  Filled 2016-06-22: qty 2

## 2016-06-22 MED ORDER — DEXAMETHASONE SODIUM PHOSPHATE 10 MG/ML IJ SOLN
INTRAMUSCULAR | Status: AC
  Filled 2016-06-22: qty 1

## 2016-06-22 MED ORDER — METOCLOPRAMIDE HCL 5 MG/ML IJ SOLN
INTRAMUSCULAR | Status: AC
  Filled 2016-06-22: qty 2

## 2016-06-22 MED ORDER — OXYCODONE-ACETAMINOPHEN 5-325 MG PO TABS
1.0000 | ORAL_TABLET | ORAL | Status: DC | PRN
  Administered 2016-06-22 – 2016-06-23 (×2): 1 via ORAL
  Filled 2016-06-22 (×2): qty 1

## 2016-06-22 MED ORDER — ROCURONIUM BROMIDE 50 MG/5ML IV SOSY
PREFILLED_SYRINGE | INTRAVENOUS | Status: AC
  Filled 2016-06-22: qty 5

## 2016-06-22 MED ORDER — LACTATED RINGERS IR SOLN
Status: DC | PRN
  Administered 2016-06-22: 200 mL

## 2016-06-22 MED ORDER — LACTATED RINGERS IV SOLN: INTRAVENOUS | Status: DC

## 2016-06-22 MED ORDER — ROCURONIUM BROMIDE 10 MG/ML (PF) SYRINGE
PREFILLED_SYRINGE | INTRAVENOUS | Status: DC | PRN
  Administered 2016-06-22: 5 mg via INTRAVENOUS
  Administered 2016-06-22: 20 mg via INTRAVENOUS
  Administered 2016-06-22: 50 mg via INTRAVENOUS

## 2016-06-22 MED ORDER — CIPROFLOXACIN IN D5W 400 MG/200ML IV SOLN
400.0000 mg | INTRAVENOUS | Status: AC
  Administered 2016-06-22: 400 mg via INTRAVENOUS
  Filled 2016-06-22: qty 200

## 2016-06-22 MED ORDER — SCOPOLAMINE 1 MG/3DAYS TD PT72
1.0000 | MEDICATED_PATCH | Freq: Once | TRANSDERMAL | Status: DC
  Administered 2016-06-22: 1.5 mg via TRANSDERMAL

## 2016-06-22 MED ORDER — MEPERIDINE HCL 50 MG/ML IJ SOLN: 6.2500 mg | INTRAMUSCULAR | Status: DC | PRN

## 2016-06-22 MED ORDER — CLINDAMYCIN PHOSPHATE 900 MG/50ML IV SOLN
900.0000 mg | INTRAVENOUS | Status: AC
  Administered 2016-06-22: 900 mg via INTRAVENOUS
  Filled 2016-06-22: qty 50

## 2016-06-22 MED ORDER — HYDROMORPHONE HCL 1 MG/ML IJ SOLN
0.2000 mg | INTRAMUSCULAR | Status: DC | PRN
  Administered 2016-06-22: 0.2 mg via INTRAVENOUS
  Filled 2016-06-22: qty 1

## 2016-06-22 MED ORDER — ONDANSETRON HCL 4 MG PO TABS: 4.0000 mg | ORAL_TABLET | Freq: Four times a day (QID) | ORAL | Status: DC | PRN

## 2016-06-22 MED ORDER — FENTANYL CITRATE (PF) 100 MCG/2ML IJ SOLN
INTRAMUSCULAR | Status: DC | PRN
  Administered 2016-06-22 (×5): 50 ug via INTRAVENOUS

## 2016-06-22 MED ORDER — LACTATED RINGERS IV SOLN
INTRAVENOUS | Status: DC
  Administered 2016-06-22 (×3): via INTRAVENOUS

## 2016-06-22 MED ORDER — FENTANYL CITRATE (PF) 100 MCG/2ML IJ SOLN
25.0000 ug | INTRAMUSCULAR | Status: DC | PRN
Start: 2016-06-22 — End: 2016-06-22
  Administered 2016-06-22 (×2): 25 ug via INTRAVENOUS
  Administered 2016-06-22: 50 ug via INTRAVENOUS
  Administered 2016-06-22 (×2): 25 ug via INTRAVENOUS

## 2016-06-22 MED ORDER — MIDAZOLAM HCL 2 MG/2ML IJ SOLN
INTRAMUSCULAR | Status: AC
  Filled 2016-06-22: qty 2

## 2016-06-22 MED ORDER — SIMETHICONE 80 MG PO CHEW
80.0000 mg | CHEWABLE_TABLET | ORAL | Status: DC | PRN
  Administered 2016-06-22 – 2016-06-23 (×2): 80 mg via ORAL
  Filled 2016-06-22 (×2): qty 1

## 2016-06-22 MED ORDER — SCOPOLAMINE 1 MG/3DAYS TD PT72
MEDICATED_PATCH | TRANSDERMAL | Status: AC
  Filled 2016-06-22: qty 1

## 2016-06-22 MED ORDER — SUGAMMADEX SODIUM 200 MG/2ML IV SOLN
INTRAVENOUS | Status: AC
  Filled 2016-06-22: qty 2

## 2016-06-22 MED ORDER — GABAPENTIN 300 MG PO CAPS
600.0000 mg | ORAL_CAPSULE | Freq: Every day | ORAL | Status: AC
  Administered 2016-06-22: 600 mg via ORAL
  Filled 2016-06-22: qty 2

## 2016-06-22 MED ORDER — PROPOFOL 10 MG/ML IV BOLUS
INTRAVENOUS | Status: AC
  Filled 2016-06-22: qty 40

## 2016-06-22 MED ORDER — DICLOFENAC SODIUM 50 MG PO TBEC
50.0000 mg | DELAYED_RELEASE_TABLET | Freq: Three times a day (TID) | ORAL | Status: DC
  Administered 2016-06-22 – 2016-06-23 (×3): 50 mg via ORAL
  Filled 2016-06-22 (×4): qty 1

## 2016-06-22 MED ORDER — EPHEDRINE SULFATE-NACL 50-0.9 MG/10ML-% IV SOSY
PREFILLED_SYRINGE | INTRAVENOUS | Status: DC | PRN
  Administered 2016-06-22: 10 mg via INTRAVENOUS

## 2016-06-22 MED ORDER — ONDANSETRON HCL 4 MG/2ML IJ SOLN
INTRAMUSCULAR | Status: DC | PRN
  Administered 2016-06-22: 4 mg via INTRAVENOUS

## 2016-06-22 MED ORDER — LIDOCAINE 2% (20 MG/ML) 5 ML SYRINGE
INTRAMUSCULAR | Status: AC
  Filled 2016-06-22: qty 5

## 2016-06-22 MED ORDER — PROPOFOL 10 MG/ML IV BOLUS
INTRAVENOUS | Status: DC | PRN
  Administered 2016-06-22: 200 mg via INTRAVENOUS

## 2016-06-22 MED ORDER — MIDAZOLAM HCL 5 MG/5ML IJ SOLN
INTRAMUSCULAR | Status: DC | PRN
  Administered 2016-06-22: 2 mg via INTRAVENOUS

## 2016-06-22 SURGICAL SUPPLY — 59 items
APPLICATOR SURGIFLO ENDO (HEMOSTASIS) IMPLANT
BAG LAPAROSCOPIC 12 15 PORT 16 (BASKET) IMPLANT
BAG RETRIEVAL 12/15 (BASKET)
CHLORAPREP W/TINT 26ML (MISCELLANEOUS) ×2 IMPLANT
COVER BACK TABLE 60X90IN (DRAPES) ×2 IMPLANT
COVER SURGICAL LIGHT HANDLE (MISCELLANEOUS) ×2 IMPLANT
COVER TIP SHEARS 8 DVNC (MISCELLANEOUS) ×1 IMPLANT
COVER TIP SHEARS 8MM DA VINCI (MISCELLANEOUS) ×1
DERMABOND ADVANCED (GAUZE/BANDAGES/DRESSINGS) ×1
DERMABOND ADVANCED .7 DNX12 (GAUZE/BANDAGES/DRESSINGS) ×1 IMPLANT
DRAPE ARM DVNC X/XI (DISPOSABLE) ×4 IMPLANT
DRAPE COLUMN DVNC XI (DISPOSABLE) ×1 IMPLANT
DRAPE DA VINCI XI ARM (DISPOSABLE) ×4
DRAPE DA VINCI XI COLUMN (DISPOSABLE) ×1
DRAPE SHEET LG 3/4 BI-LAMINATE (DRAPES) ×4 IMPLANT
DRAPE SURG IRRIG POUCH 19X23 (DRAPES) ×2 IMPLANT
ELECT REM PT RETURN 15FT ADLT (MISCELLANEOUS) ×2 IMPLANT
GLOVE BIO SURGEON STRL SZ 6 (GLOVE) ×8 IMPLANT
GLOVE BIO SURGEON STRL SZ 6.5 (GLOVE) ×4 IMPLANT
GOWN STRL REUS W/ TWL LRG LVL3 (GOWN DISPOSABLE) ×2 IMPLANT
GOWN STRL REUS W/TWL LRG LVL3 (GOWN DISPOSABLE) ×2
HOLDER FOLEY CATH W/STRAP (MISCELLANEOUS) ×2 IMPLANT
IRRIG SUCT STRYKERFLOW 2 WTIP (MISCELLANEOUS) ×2
IRRIGATION SUCT STRKRFLW 2 WTP (MISCELLANEOUS) ×1 IMPLANT
KIT BASIN OR (CUSTOM PROCEDURE TRAY) ×2 IMPLANT
KIT PROCEDURE DA VINCI SI (MISCELLANEOUS)
KIT PROCEDURE DVNC SI (MISCELLANEOUS) IMPLANT
MANIPULATOR UTERINE 4.5 ZUMI (MISCELLANEOUS) ×2 IMPLANT
MARKER SKIN DUAL TIP RULER LAB (MISCELLANEOUS) ×2 IMPLANT
NDL SAFETY ECLIPSE 18X1.5 (NEEDLE) IMPLANT
NEEDLE HYPO 18GX1.5 SHARP (NEEDLE)
NEEDLE SPNL 18GX3.5 QUINCKE PK (NEEDLE) IMPLANT
OBTURATOR OPTICAL STANDARD 8MM (TROCAR) ×1
OBTURATOR OPTICAL STND 8 DVNC (TROCAR) ×1
OBTURATOR OPTICALSTD 8 DVNC (TROCAR) ×1 IMPLANT
OCCLUDER COLPOPNEUMO (BALLOONS) ×2 IMPLANT
PAD POSITIONING PINK XL (MISCELLANEOUS) ×2 IMPLANT
PORT ACCESS TROCAR AIRSEAL 12 (TROCAR) ×1 IMPLANT
PORT ACCESS TROCAR AIRSEAL 5M (TROCAR) ×1
POUCH SPECIMEN RETRIEVAL 10MM (ENDOMECHANICALS) IMPLANT
SEAL CANN UNIV 5-8 DVNC XI (MISCELLANEOUS) ×4 IMPLANT
SEAL XI 5MM-8MM UNIVERSAL (MISCELLANEOUS) ×4
SET TRI-LUMEN FLTR TB AIRSEAL (TUBING) ×2 IMPLANT
SHEET LAVH (DRAPES) ×2 IMPLANT
SOLUTION ELECTROLUBE (MISCELLANEOUS) ×2 IMPLANT
SURGIFLO W/THROMBIN 8M KIT (HEMOSTASIS) IMPLANT
SUT MNCRL AB 4-0 PS2 18 (SUTURE) ×4 IMPLANT
SUT VIC AB 0 CT1 27 (SUTURE) ×1
SUT VIC AB 0 CT1 27XBRD ANTBC (SUTURE) ×1 IMPLANT
SYR 10ML LL (SYRINGE) ×2 IMPLANT
SYR 50ML LL SCALE MARK (SYRINGE) ×2 IMPLANT
TOWEL OR 17X26 10 PK STRL BLUE (TOWEL DISPOSABLE) ×2 IMPLANT
TOWEL OR NON WOVEN STRL DISP B (DISPOSABLE) ×2 IMPLANT
TRAP SPECIMEN MUCOUS 40CC (MISCELLANEOUS) IMPLANT
TRAY FOLEY W/METER SILVER 16FR (SET/KITS/TRAYS/PACK) ×2 IMPLANT
TRAY LAPAROSCOPIC (CUSTOM PROCEDURE TRAY) ×2 IMPLANT
TROCAR BLADELESS OPT 5 100 (ENDOMECHANICALS) ×2 IMPLANT
UNDERPAD 30X30 (UNDERPADS AND DIAPERS) ×4 IMPLANT
WATER STERILE IRR 1000ML POUR (IV SOLUTION) ×6 IMPLANT

## 2016-06-22 NOTE — Anesthesia Procedure Notes (Signed)
Procedure Name: Intubation Date/Time: 06/22/2016 8:54 AM Performed by: Hadi Dubin, Virgel Gess Pre-anesthesia Checklist: Patient identified, Emergency Drugs available, Suction available, Patient being monitored and Timeout performed Patient Re-evaluated:Patient Re-evaluated prior to inductionOxygen Delivery Method: Circle system utilized Preoxygenation: Pre-oxygenation with 100% oxygen Intubation Type: IV induction Ventilation: Mask ventilation without difficulty Laryngoscope Size: Mac and 4 Grade View: Grade II Tube type: Oral Tube size: 7.5 mm Number of attempts: 1 Airway Equipment and Method: Stylet Placement Confirmation: ETT inserted through vocal cords under direct vision,  positive ETCO2,  CO2 detector and breath sounds checked- equal and bilateral Secured at: 21 cm Tube secured with: Tape Dental Injury: Teeth and Oropharynx as per pre-operative assessment

## 2016-06-22 NOTE — Anesthesia Preprocedure Evaluation (Signed)
Anesthesia Evaluation  Patient identified by MRN, date of birth, ID band Patient awake    Reviewed: Allergy & Precautions, NPO status , Patient's Chart, lab work & pertinent test results  History of Anesthesia Complications (+) PONV and history of anesthetic complications  Airway Mallampati: II  TM Distance: >3 FB Neck ROM: Full  Mouth opening: Limited Mouth Opening  Dental  (+) Dental Advisory Given   Pulmonary neg pulmonary ROS,    breath sounds clear to auscultation       Cardiovascular (-) angina Rhythm:Regular Rate:Normal  09/26/15 ECHO:  EF 60-65%, valves OK   Neuro/Psych sciatica    GI/Hepatic negative GI ROS, Neg liver ROS,   Endo/Other  negative endocrine ROS  Renal/GU negative Renal ROS     Musculoskeletal  (+) Arthritis , Osteoarthritis,    Abdominal   Peds  Hematology negative hematology ROS (+)   Anesthesia Other Findings Breast cancer  Reproductive/Obstetrics                             Anesthesia Physical  Anesthesia Plan  ASA: II  Anesthesia Plan: General   Post-op Pain Management:    Induction: Intravenous  PONV Risk Score and Plan: 4 or greater and Ondansetron, Dexamethasone, Propofol, Midazolam and Scopolamine patch - Pre-op  Airway Management Planned: Oral ETT  Additional Equipment:   Intra-op Plan:   Post-operative Plan: Extubation in OR  Informed Consent: I have reviewed the patients History and Physical, chart, labs and discussed the procedure including the risks, benefits and alternatives for the proposed anesthesia with the patient or authorized representative who has indicated his/her understanding and acceptance.   Dental advisory given  Plan Discussed with: CRNA and Surgeon  Anesthesia Plan Comments:         Anesthesia Quick Evaluation

## 2016-06-22 NOTE — Discharge Instructions (Signed)
06/22/2016  Return to work: 4 weeks  Activity: 1. Be up and out of the bed during the day.  Take a nap if needed.  You may walk up steps but be careful and use the hand rail.  Stair climbing will tire you more than you think, you may need to stop part way and rest.   2. No lifting or straining for 6 weeks.  3. No driving for 1 weeks.  Do Not drive if you are taking narcotic pain medicine.  4. Shower daily.  Use soap and water on your incision and pat dry; don't rub.   5. No sexual activity and nothing in the vagina for 8 weeks.  Medications:  - Take ibuprofen and tylenol first line for pain control. Take these regularly (every 6 hours) to decrease the build up of pain.  - If necessary, for severe pain not relieved by ibuprofen, take percocet.  - While taking percocet you should take sennakot every night to reduce the likelihood of constipation. If this causes diarrhea, stop its use.  Diet: 1. Low sodium Heart Healthy Diet is recommended.  2. It is safe to use a laxative if you have difficulty moving your bowels.   Wound Care: 1. Keep clean and dry.  Shower daily.  Reasons to call the Doctor:   Fever - Oral temperature greater than 100.4 degrees Fahrenheit  Foul-smelling vaginal discharge  Difficulty urinating  Nausea and vomiting  Increased pain at the site of the incision that is unrelieved with pain medicine.  Difficulty breathing with or without chest pain  New calf pain especially if only on one side  Sudden, continuing increased vaginal bleeding with or without clots.   Follow-up: 1. See Everitt Amber in 3 to 4 weeks.  Contacts: For questions or concerns you should contact:  Dr. Everitt Amber at [phone] After hours and on week-ends call [phone] and ask to speak to the physician on call for Gynecologic Oncology

## 2016-06-22 NOTE — Anesthesia Postprocedure Evaluation (Signed)
Anesthesia Post Note  Patient: Melanie Barber  Procedure(s) Performed: Procedure(s) (LRB): XI ROBOTIC ASSISTED TOTAL HYSTERECTOMY WITH BILATERAL SALPINGO OOPHORECTOMY (Bilateral)     Patient location during evaluation: PACU Anesthesia Type: General Level of consciousness: awake and alert Pain management: pain level controlled Vital Signs Assessment: post-procedure vital signs reviewed and stable Respiratory status: spontaneous breathing, nonlabored ventilation, respiratory function stable and patient connected to nasal cannula oxygen Cardiovascular status: blood pressure returned to baseline and stable Postop Assessment: no signs of nausea or vomiting Anesthetic complications: no    Last Vitals:  Vitals:   06/22/16 1138 06/22/16 1238  BP: 117/70 105/65  Pulse: 80 60  Resp: 14 16  Temp: 36.6 C 36.6 C    Last Pain:  Vitals:   06/22/16 1238  TempSrc: Oral  PainSc:                  Montez Hageman

## 2016-06-22 NOTE — H&P (Signed)
CC:     Chief Complaint  Patient presents with  . Breast Cancer    Assessment/Plan:  Ms. LEYLA SOLIZ  is a 51 y.o.  year old woman with a personal history of premenopausal right sided ER/PR positive breast cancer. Recommendation is for oophorectomy for surgical castration to reduce risk for recurrence.  I discussed that this is a reasonable option and could be accomplished through a minimally invasive fashion.  I would recommend performing robotic assisted BSO with hysterectomy in mid June, 2018 after she has completed primary therapy for her breast cancer.    HPI: Taylore Hinde is a 51 year old woman who is seen in consultation at the request of Dr Dwain Sarna for surgical castration in the setting of right ER/PR receptor positive breast cancer (s/p bilateral mastectomies (most recently right in September 2017) followed by adjuvant chemotherapy. She is currently receiving abraxane chemotherapy with Dr Georgiann Mohs, for a planned additional 3 months, followed by chest wall radiation. She will then have implants placed 6 months after completing radiation.  She met with Dr Fuller Canada at [facility] for consultation and he recommended surgical castration so that she could avoid tamoxifen and lupron.   She underwent genetic testing which did not identify known deleterious mutations that predispose to breast/ovarian/uterine cancers.  She works as an Insurance underwriter at the cardiac ICU. She is healthy with no prior abdominal surgical history and has had one prior SVD.  Interval Hx:  She has completed chemotherapy and is now receiving radiation.   Current Meds:      Outpatient Encounter Prescriptions as of 05/10/2016  Medication Sig  . acetaminophen (TYLENOL) 500 MG tablet Take 1,000 mg by mouth.  . Atorvastatin Calcium (INVESTIGATIONAL ATORVASTATIN/PLACEBO) 40 MG tablet [facility] [phone] Take 1 tablet by mouth daily. Take 2 doses (these doses must be 12 hours apart) prior to first  chemotherapy treatment. Then take 1 tablet daily by mouth with or without food.  . Atorvastatin Calcium (INVESTIGATIONAL ATORVASTATIN/PLACEBO) 40 MG tablet [facility] [phone] Take 1 tablet by mouth daily. Take 1 tablet daily with or without food.  . Biotin (BIOTIN 5000) 5 MG CAPS Take 5 mg by mouth daily.   . hyaluronate sodium (RADIAPLEXRX) GEL Apply 1 application topically 2 (two) times daily.  Marland Kitchen ibuprofen (ADVIL,MOTRIN) 200 MG tablet Take 400 mg by mouth every 6 (six) hours as needed for mild pain.  Marland Kitchen levonorgestrel (MIRENA) 20 MCG/24HR IUD 1 each by Intrauterine route once.  Marland Kitchen LORazepam (ATIVAN) 0.5 MG tablet Take by mouth.  . Multiple Vitamin (MULTIVITAMIN WITH MINERALS) TABS tablet Take 1 tablet by mouth daily.  . non-metallic deodorant Thornton Papas) MISC Apply 1 application topically daily.  . Probiotic Product (PRO-BIOTIC BLEND PO) Take 1 tablet by mouth daily.   . PSYLLIUM HUSK PO Take 1 capsule by mouth daily.    No facility-administered encounter medications on file as of 05/10/2016.     Allergy:      Allergies  Allergen Reactions  . Keflex [Cephalexin] Anaphylaxis  . Decadron [Dexamethasone] Anxiety    Social Hx:   Social History        Social History  . Marital status: Divorced    Spouse name: N/A  . Number of children: N/A  . Years of education: N/A   Occupational History  . Not on file.         Social History Main Topics  . Smoking status: Never Smoker  . Smokeless tobacco: Never Used  . Alcohol  use Yes     Comment: socially  . Drug use: No  . Sexual activity: Yes    Birth control/ protection: IUD       Other Topics Concern  . Not on file      Social History Narrative  . No narrative on file    Past Surgical Hx:       Past Surgical History:  Procedure Laterality Date  . BREAST BIOPSY Right   . BREAST CYST ASPIRATION Right   . BREAST RECONSTRUCTION WITH PLACEMENT OF TISSUE EXPANDER AND FLEX HD (ACELLULAR HYDRATED DERMIS)  Bilateral 10/01/2015  . BREAST RECONSTRUCTION WITH PLACEMENT OF TISSUE EXPANDER AND FLEX HD (ACELLULAR HYDRATED DERMIS) Bilateral 10/01/2015   Procedure: BREAST RECONSTRUCTION WITH PLACEMENT OF TISSUE EXPANDER AND CORTIVA;  Surgeon: Irene Limbo, MD;  Location: [place];  Service: Plastics;  Laterality: Bilateral;  . Alex   growth removed  . MASTECTOMY Left 10/01/2015   NIPPLE SPARING  . MASTECTOMY COMPLETE / SIMPLE W/ SENTINEL NODE BIOPSY Right 10/01/2015   NIPPLE SPARING  . NASAL/SINUS ENDOSCOPY Bilateral 2005  . NIPPLE SPARING MASTECTOMY/SENTINAL LYMPH NODE BIOPSY/RECONSTRUCTION/PLACEMENT OF TISSUE EXPANDER Bilateral 10/01/2015   Procedure: BILATERAL NIPPLE SPARING MASTECTOMY WITH RIGHT SENTINAL LYMPH NODE BIOPSY;  Surgeon: Rolm Bookbinder, MD;  Location: [place];  Service: General;  Laterality: Bilateral;  . PORT-A-CATH REMOVAL Right 04/01/2016   Procedure: MINOR REMOVAL PORT-A-CATH;  Surgeon: Rolm Bookbinder, MD;  Location: [place];  Service: General;  Laterality: Right;  . PORTACATH PLACEMENT Right 10/01/2015  . PORTACATH PLACEMENT Right 10/01/2015   Procedure: INSERTION PORT-A-CATH;  Surgeon: Rolm Bookbinder, MD;  Location: [place];  Service: General;  Laterality: Right;  . TOE SURGERY Left 1990   "bone spur on great toe"  . TONSILLECTOMY  1977  . ULTRASOUND GUIDANCE FOR VASCULAR ACCESS Right 10/01/2015   Procedure: ULTRASOUND GUIDANCE;  Surgeon: Rolm Bookbinder, MD;  Location: [place];  Service: General;  Laterality: Right;  . WISDOM TOOTH EXTRACTION      Past Medical Hx:      Past Medical History:  Diagnosis Date  . Arthritis    right knee (10/01/2015)  . Cancer of right breast ([place])   . Migraine    "once when I was going thru a divorce; called them cluster" (10/01/2015)  . PONV (postoperative nausea and vomiting)   . Sciatic leg pain    left leg    Past Gynecological History:  SVD x 1. No prior abnormal paps.  Mirena since 2015. No LMP recorded. Patient has had an implant.  Family Hx:        Family History  Problem Relation Age of Onset  . Depression Mother   . Inflammatory bowel disease Mother   . Alcohol abuse Mother   . Bipolar disorder Mother   . Hypertension Son   . Other Brother     severe intellectual disabilities  . Arthritis Maternal Uncle     hx knee replacement surgeries  . Stroke Maternal Grandmother 86  . Breast cancer Maternal Grandmother     dx before menopause  . Uterine cancer Maternal Grandmother     dx late 72s  . Bipolar disorder Maternal Grandfather   . Stroke Paternal Grandmother 83  . Diabetes Paternal Grandmother     later in life  . Heart attack Paternal Grandfather     Review of Systems:  Constitutional  Feels well,    ENT Normal appearing ears and nares bilaterally Skin/Breast  No rash,  sores, jaundice, itching, dryness Cardiovascular  No chest pain, shortness of breath, or edema  Pulmonary  No cough or wheeze.  Gastro Intestinal  No nausea, vomitting, or diarrhoea. No bright red blood per rectum, no abdominal pain, change in bowel movement, or constipation.  Genito Urinary  No frequency, urgency, dysuria, no bleeding Musculo Skeletal  No myalgia, arthralgia, joint swelling or pain  Neurologic  No weakness, numbness, change in gait,  Psychology  No depression, anxiety, insomnia.   Vitals:  Blood pressure 112/60, pulse 60, temperature 98 F (36.7 C), temperature source Oral, resp. rate 18, weight 177 lb 6.4 oz (80.5 kg).  Physical Exam: WD in NAD Neck  Supple NROM, without any enlargements.  Lymph Node Survey No cervical supraclavicular or inguinal adenopathy Cardiovascular  Pulse normal rate, regularity and rhythm. S1 and S2 normal.  Lungs  Clear to auscultation bilateraly, without wheezes/crackles/rhonchi. Good air movement.  Skin  No rash/lesions/breakdown  Psychiatry  Alert and oriented to person,  place, and time  Abdomen  Normoactive bowel sounds, abdomen soft, non-tender and nonobese without evidence of hernia.  Back No CVA tenderness Genito Urinary  Vulva/vagina: Normal external female genitalia.  No lesions. No discharge or bleeding.             Bladder/urethra:  No lesions or masses, well supported bladder             Vagina: normal             Cervix: Normal appearing, no lesions.             Uterus:  Small, mobile, no parametrial involvement or nodularity.             Adnexa: no palpable masses. Rectal  denied  Extremities  No bilateral cyanosis, clubbing or edema.   Donaciano Eva, MD

## 2016-06-22 NOTE — Telephone Encounter (Signed)
Scheduled post op appt and the patient will receive it with discharge summary

## 2016-06-22 NOTE — Op Note (Signed)
OPERATIVE NOTE 06/22/16  Surgeon: Donaciano Eva   Assistants: Dr Lahoma Crocker (an MD assistant was necessary for tissue manipulation, management of robotic instrumentation, retraction and positioning due to the complexity of the case and hospital policies).   Anesthesia: General endotracheal anesthesia  ASA Class: 2   Pre-operative Diagnosis: hormone receptor positive breast cancer, requires ovarian ablation  Post-operative Diagnosis: same  Operation: Robotic-assisted laparoscopic total hysterectomy with bilateral salpingoophorectomy   Surgeon: Donaciano Eva  Assistant Surgeon: Lahoma Crocker MD  Anesthesia: GET  Urine Output: 200cc  Operative Findings:  : 6cm normal appearing uterus, normal appearing tubes and ovaries.  Estimated Blood Loss:  <20cc      Total IV Fluids: 300 ml         Specimens: uterus, cervix, bilateral tubes and ovaries.         Complications:  None; patient tolerated the procedure well.         Disposition: PACU - hemodynamically stable.  Procedure Details  The patient was seen in the Holding Room. The risks, benefits, complications, treatment options, and expected outcomes were discussed with the patient.  The patient concurred with the proposed plan, giving informed consent.  The site of surgery properly noted/marked. The patient was identified as Melanie Barber and the procedure verified as a Robotic-assisted hysterectomy with bilateral salpingo oophorectomy. A Time Out was held and the above information confirmed.  After induction of anesthesia, the patient was draped and prepped in the usual sterile manner. Pt was placed in supine position after anesthesia and draped and prepped in the usual sterile manner. The abdominal drape was placed after the CholoraPrep had been allowed to dry for 3 minutes.  Her arms were tucked to her side with all appropriate precautions.  The shoulders were stabilized with padded shoulder blocks  applied to the acromium processes.  The patient was placed in the semi-lithotomy position in [place].  The perineum was prepped with Betadine. The patient was then prepped. Foley catheter was placed.  A sterile speculum was placed in the vagina.  The cervix was grasped with a single-tooth tenaculum and dilated with Kennon Rounds dilators.  The ZUMI uterine manipulator with a medium colpotomizer ring was placed without difficulty.  A pneum occluder balloon was placed over the manipulator.  OG tube placement was confirmed and to suction.   Next, a 5 mm skin incision was made 1 cm below the subcostal margin in the midclavicular line.  The 5 mm Optiview port and scope was used for direct entry.  Opening pressure was under 10 mm CO2.  The abdomen was insufflated and the findings were noted as above.   At this point and all points during the procedure, the patient's intra-abdominal pressure did not exceed 15 mmHg. Next, a 10 mm skin incision was made in the umbilicus and a right and left port was placed about 10 cm lateral to the robot port on the right and left side  The patient was placed in steep Trendelenburg.  Bowel was folded away into the upper abdomen.  The robot was docked in the normal manner.  The hysterectomy was started after the round ligament on the right side was incised and the retroperitoneum was entered and the pararectal space was developed.  The ureter was noted to be on the medial leaf of the broad ligament.  The peritoneum above the ureter was incised and stretched and the infundibulopelvic ligament was skeletonized, cauterized and cut.  The posterior peritoneum was taken down  to the level of the KOH ring.  The anterior peritoneum was also taken down.  The bladder flap was created to the level of the KOH ring.  The uterine artery on the right side was skeletonized, cauterized and cut in the normal manner.  A similar procedure was performed on the left.  The colpotomy was made and the uterus,  cervix, bilateral ovaries and tubes were amputated and delivered through the vagina.  Pedicles were inspected and excellent hemostasis was achieved.    The colpotomy at the vaginal cuff was closed with Vicryl on a CT1 needle in a figure of 8 manner with 5 sutures.  Irrigation was used and excellent hemostasis was achieved.  At this point in the procedure was completed.  Robotic instruments were removed under direct visulaization.  The robot was undocked. The 10 mm ports were closed with Vicryl on a UR-5 needle and the fascia was closed with 0 Vicryl on a UR-5 needle.  The skin was closed with 4-0 Vicryl in a subcuticular manner.  Dermabond was applied.  Sponge, lap and needle counts correct x 2.  The patient was taken to the recovery room in stable condition.  The vagina was swabbed with  minimal bleeding noted.   All instrument and needle counts were correct x  3.   The patient was transferred to the recovery room in a stable condition.  Donaciano Eva, MD

## 2016-06-22 NOTE — Transfer of Care (Signed)
Immediate Anesthesia Transfer of Care Note  Patient: Melanie Barber  Procedure(s) Performed: Procedure(s): XI ROBOTIC ASSISTED TOTAL HYSTERECTOMY WITH BILATERAL SALPINGO OOPHORECTOMY (Bilateral)  Patient Location: PACU  Anesthesia Type:General  Level of Consciousness:  sedated, patient cooperative and responds to stimulation  Airway & Oxygen Therapy:Patient Spontanous Breathing and Patient connected to face mask oxgen  Post-op Assessment:  Report given to PACU RN and Post -op Vital signs reviewed and stable  Post vital signs:  Reviewed and stable  Last Vitals:  Vitals:   06/22/16 0656  BP: 114/79  Pulse: 86  Resp: 16  Temp: 71.2 C    Complications: No apparent anesthesia complications

## 2016-06-23 ENCOUNTER — Encounter (HOSPITAL_COMMUNITY): Payer: Self-pay | Admitting: Gynecologic Oncology

## 2016-06-23 DIAGNOSIS — C50411 Malignant neoplasm of upper-outer quadrant of right female breast: Secondary | ICD-10-CM | POA: Diagnosis not present

## 2016-06-23 DIAGNOSIS — N800 Endometriosis of uterus: Secondary | ICD-10-CM | POA: Diagnosis not present

## 2016-06-23 DIAGNOSIS — Z4002 Encounter for prophylactic removal of ovary: Secondary | ICD-10-CM | POA: Diagnosis not present

## 2016-06-23 DIAGNOSIS — Z9013 Acquired absence of bilateral breasts and nipples: Secondary | ICD-10-CM | POA: Diagnosis not present

## 2016-06-23 DIAGNOSIS — Z170 Estrogen receptor positive status [ER+]: Secondary | ICD-10-CM | POA: Diagnosis not present

## 2016-06-23 DIAGNOSIS — Z79899 Other long term (current) drug therapy: Secondary | ICD-10-CM | POA: Diagnosis not present

## 2016-06-23 DIAGNOSIS — Z9221 Personal history of antineoplastic chemotherapy: Secondary | ICD-10-CM | POA: Diagnosis not present

## 2016-06-23 DIAGNOSIS — N72 Inflammatory disease of cervix uteri: Secondary | ICD-10-CM | POA: Diagnosis not present

## 2016-06-23 LAB — CBC
HCT: 32.1 % — ABNORMAL LOW (ref 36.0–46.0)
Hemoglobin: 10.7 g/dL — ABNORMAL LOW (ref 12.0–15.0)
MCH: 30.5 pg (ref 26.0–34.0)
MCHC: 33.3 g/dL (ref 30.0–36.0)
MCV: 91.5 fL (ref 78.0–100.0)
PLATELETS: 107 10*3/uL — AB (ref 150–400)
RBC: 3.51 MIL/uL — ABNORMAL LOW (ref 3.87–5.11)
RDW: 13.7 % (ref 11.5–15.5)
WBC: 3.8 10*3/uL — AB (ref 4.0–10.5)

## 2016-06-23 LAB — BASIC METABOLIC PANEL
ANION GAP: 5 (ref 5–15)
BUN: 13 mg/dL (ref 6–20)
CALCIUM: 8.3 mg/dL — AB (ref 8.9–10.3)
CO2: 31 mmol/L (ref 22–32)
CREATININE: 0.71 mg/dL (ref 0.44–1.00)
Chloride: 102 mmol/L (ref 101–111)
GFR calc Af Amer: >60 mL/min (ref >60)
Glucose, Bld: 178 mg/dL — ABNORMAL HIGH (ref 65–99)
Potassium: 4.3 mmol/L (ref 3.5–5.1)
Sodium: 138 mmol/L (ref 135–145)

## 2016-06-23 MED ORDER — OXYCODONE-ACETAMINOPHEN 5-325 MG PO TABS: 1.0000 | ORAL_TABLET | ORAL | 0 refills | Status: DC | PRN

## 2016-06-23 MED FILL — OXYCODONE-ACETAMINOPHEN 5-3: 5-325 | 2 days supply | Qty: 10 | Fill #0

## 2016-06-23 NOTE — Discharge Summary (Signed)
Physician Discharge Summary  Patient ID: Melanie Barber MRN: [id number] DOB/AGE: 07-Mar-1965 51 y.o.  Admit date: 06/22/2016 Discharge date: 06/23/2016  Admission Diagnoses: Breast cancer of upper-outer quadrant of right female breast [facility])  Discharge Diagnoses:  Principal Problem:   Breast cancer of upper-outer quadrant of right female breast [facility]) Active Problems:   History of bilateral breast cancer   Discharged Condition: good  Hospital Course:  1/ patient was admitted on 06/23/16 for a robotic assisted total hysterectomy, BSO for a history of ER/PR positive breast cancer 2/ surgery was uncomplicated  3/ on postoperative day 1 the patient was meeting discharge criteria: tolerating PO, voiding urine, ambulating, pain well controlled on oral medications.  4/ new medications on discharge include percocet.  Consults: None  Significant Diagnostic Studies: labs:  CBC    Component Value Date/Time   WBC 3.8 (L) 06/23/2016 0533   RBC 3.51 (L) 06/23/2016 0533   HGB 10.7 (L) 06/23/2016 0533   HGB 12.8 04/27/2016 1035   HCT 32.1 (L) 06/23/2016 0533   HCT 38.1 04/27/2016 1035   PLT 107 (L) 06/23/2016 0533   PLT 174 04/27/2016 1035   MCV 91.5 06/23/2016 0533   MCV 92.7 04/27/2016 1035   MCH 30.5 06/23/2016 0533   MCHC 33.3 06/23/2016 0533   RDW 13.7 06/23/2016 0533   RDW 14.2 04/27/2016 1035   LYMPHSABS 0.6 (L) 06/04/2016 1331   LYMPHSABS 0.6 (L) 04/27/2016 1035   MONOABS 0.3 06/04/2016 1331   MONOABS 0.3 04/27/2016 1035   EOSABS 0.1 06/04/2016 1331   EOSABS 0.1 04/27/2016 1035   BASOSABS 0.0 06/04/2016 1331   BASOSABS 0.0 04/27/2016 1035    Treatments: surgery: see above  Discharge Exam: Blood pressure 114/83, pulse 68, temperature 98.4 F (36.9 C), temperature source Oral, resp. rate 16, height 5\' 7"  (1.702 m), weight 172 lb 4 oz (78.1 kg), SpO2 99 %. General appearance: alert and cooperative GI: soft, non-tender; bowel sounds normal; no masses,  no  organomegaly Incision/Wound:  Disposition: 01-Home or Self Care    Current Facility-Administered Medications:  .  dextrose 5 % and 0.45 % NaCl with KCl 20 mEq/L infusion, , Intravenous, Continuous, Lahoma Crocker, MD, Last Rate: 50 mL/hr at 06/23/16 2233 .  diclofenac (VOLTAREN) EC tablet 50 mg, 50 mg, Oral, TID, Lahoma Crocker, MD, 50 mg at 06/22/16 2138 .  HYDROmorphone (DILAUDID) injection 0.2-0.6 mg, 0.2-0.6 mg, Intravenous, Q2H PRN, Lahoma Crocker, MD, 0.2 mg at 06/22/16 1421 .  ondansetron (ZOFRAN) tablet 4 mg, 4 mg, Oral, Q6H PRN **OR** ondansetron (ZOFRAN) injection 4 mg, 4 mg, Intravenous, Q6H PRN, Lahoma Crocker, MD .  oxyCODONE-acetaminophen (PERCOCET/ROXICET) 5-325 MG per tablet 1-2 tablet, 1-2 tablet, Oral, Q4H PRN, Lahoma Crocker, MD, 1 tablet at 06/23/16 [phone] .  simethicone (MYLICON) chewable tablet 80 mg, 80 mg, Oral, Q4H PRN, Everitt Amber, MD, 80 mg at 06/23/16 4497   Signed: Donaciano Eva 06/23/2016, 9:29 AM

## 2016-06-29 NOTE — Progress Notes (Signed)
°  Radiation Oncology         [phone] ________________________________  Name: Melanie Barber MRN: [id number]  Date: 06/08/2016  DOB: 11/09/1965  End of Treatment Note  Diagnosis:   Stage IIB, T2, N1a ER/PR positive invasive ductal carcinoma with DCIS of the right breast.     Indication for treatment:  Curative       Radiation treatment dates:   04/22/2016 to 06/08/2016  Site/dose:    1. 4 field Right breast was treated to 50.4 Gy in 28 fractions at 1.8 Gy per fraction. 2. The Right breast was boosted to 10 Gy in 5 fractions at 2 Gy per fraction.  Beams/energy:   1. Right chest wall and SCLV // 10X, 15X 2. En face elctron boost // 9E  Narrative: The patient tolerated radiation treatment relatively well.   She developed hyperpigmentation and erythema in the treatment area with no moist desquamation. She did not have any major concerns or complaints with treatment.   Plan: The patient has completed radiation treatment. The patient will return to radiation oncology clinic for routine followup in one month. I advised them to call or return sooner if they have any questions or concerns related to their recovery or treatment.  ------------------------------------------------  Jodelle Gross, MD, PhD  This document serves as a record of services personally performed by Kyung Rudd, MD. It was created on his behalf by Arlyce Harman, a trained medical scribe. The creation of this record is based on the scribe's personal observations and the provider's statements to them. This document has been checked and approved by the attending provider.

## 2016-07-01 ENCOUNTER — Encounter (HOSPITAL_COMMUNITY): Payer: Self-pay

## 2016-07-05 ENCOUNTER — Other Ambulatory Visit: Payer: Self-pay | Admitting: *Deleted

## 2016-07-05 DIAGNOSIS — Z170 Estrogen receptor positive status [ER+]: Principal | ICD-10-CM

## 2016-07-05 DIAGNOSIS — C50411 Malignant neoplasm of upper-outer quadrant of right female breast: Secondary | ICD-10-CM

## 2016-07-05 NOTE — Progress Notes (Signed)
Pt called with c/o lower back pain with flare up episodes x3 since June 2018. Per Dr. Lindi Adie CT abd, pelvis ordered. Pt notified

## 2016-07-06 ENCOUNTER — Telehealth: Payer: Self-pay | Admitting: Hematology and Oncology

## 2016-07-06 DIAGNOSIS — Z9013 Acquired absence of bilateral breasts and nipples: Secondary | ICD-10-CM | POA: Diagnosis not present

## 2016-07-06 DIAGNOSIS — Z853 Personal history of malignant neoplasm of breast: Secondary | ICD-10-CM | POA: Diagnosis not present

## 2016-07-06 NOTE — Telephone Encounter (Signed)
Emailed pt letting her know

## 2016-07-06 NOTE — Telephone Encounter (Signed)
This pt sent a new pt request through the online system. Would you like to take her on as a new pt? Or I can suggest that she check with another office at this time. Please advise.  (Pt is requesting an afternoon appointment)

## 2016-07-06 NOTE — Telephone Encounter (Signed)
I am currently not accepting new patients

## 2016-07-12 ENCOUNTER — Ambulatory Visit (HOSPITAL_COMMUNITY): Payer: 59

## 2016-07-12 ENCOUNTER — Encounter: Payer: Self-pay | Admitting: Hematology and Oncology

## 2016-07-12 ENCOUNTER — Ambulatory Visit: Payer: 59 | Attending: Gynecologic Oncology | Admitting: Gynecologic Oncology

## 2016-07-12 ENCOUNTER — Encounter: Payer: Self-pay | Admitting: Gynecologic Oncology

## 2016-07-12 ENCOUNTER — Ambulatory Visit (HOSPITAL_BASED_OUTPATIENT_CLINIC_OR_DEPARTMENT_OTHER): Payer: 59 | Admitting: Hematology and Oncology

## 2016-07-12 VITALS — BP 106/63 | HR 71 | Temp 97.7°F | Resp 18 | Ht 67.0 in | Wt 171.5 lb

## 2016-07-12 VITALS — BP 106/63 | HR 71 | Temp 97.7°F | Resp 18 | Wt 171.0 lb

## 2016-07-12 DIAGNOSIS — Z08 Encounter for follow-up examination after completed treatment for malignant neoplasm: Secondary | ICD-10-CM | POA: Diagnosis not present

## 2016-07-12 DIAGNOSIS — Z90710 Acquired absence of both cervix and uterus: Secondary | ICD-10-CM | POA: Insufficient documentation

## 2016-07-12 DIAGNOSIS — Z170 Estrogen receptor positive status [ER+]: Secondary | ICD-10-CM | POA: Diagnosis not present

## 2016-07-12 DIAGNOSIS — Z79811 Long term (current) use of aromatase inhibitors: Secondary | ICD-10-CM | POA: Diagnosis not present

## 2016-07-12 DIAGNOSIS — C50411 Malignant neoplasm of upper-outer quadrant of right female breast: Secondary | ICD-10-CM

## 2016-07-12 DIAGNOSIS — Z9889 Other specified postprocedural states: Secondary | ICD-10-CM | POA: Insufficient documentation

## 2016-07-12 DIAGNOSIS — Z853 Personal history of malignant neoplasm of breast: Secondary | ICD-10-CM | POA: Insufficient documentation

## 2016-07-12 DIAGNOSIS — Z90722 Acquired absence of ovaries, bilateral: Secondary | ICD-10-CM | POA: Insufficient documentation

## 2016-07-12 DIAGNOSIS — C50911 Malignant neoplasm of unspecified site of right female breast: Secondary | ICD-10-CM

## 2016-07-12 MED ORDER — ANASTROZOLE 1 MG PO TABS: 1.0000 mg | ORAL_TABLET | Freq: Every day | ORAL | 3 refills | Status: DC

## 2016-07-12 MED FILL — ANASTROZOLE 1 MG TABLET: 1 | 90 days supply | Qty: 90 | Fill #0

## 2016-07-12 NOTE — Patient Instructions (Signed)
No follow up necessary at this time unless concerns or issues arise in the future.  Please call for any needs or concerns.

## 2016-07-12 NOTE — Assessment & Plan Note (Signed)
Bilateral mastectomies 10/01/2015 With reconstruction Right mastectomy: IDC grade 2, multifocal largest 2.5 cm, with high-grade DCIS, broadly present at anterior margin and inferior margin 2/5 LN positive, LVI Present,  Left mastectomy: ALH, ER 95%, PR 5-65%, HER-2 negative, Ki-67 10-20%,  Pathologic stage: T2 N1 a stage IIB Mammaprint: High risk CT-CAP and bone scan: No evidence of metastatic disease ----------------------------------------------------------------------------------------------------------------------------------------------------------------- Treatment plan: 1. Adjuvant chemotherapy with dose dense Adriamycin and Cytoxan 4 followed by Abraxane weekly 12 ( patient is intolerant to steroids and hence we cannot use paclitaxel) started 11/05/2015 completed 03/17/2016 3. Followed by adjuvant radiation therapy 4. Followed by adjuvant antiestrogen therapy with tamoxifen 10 years  PREVENT trial: [id number]  ----------------------------------------------------------------------------------------------------------------------------------------------------------------- Current treatment: Completed 4 cycles ofdose dense Adriamycin Cytoxan. Today is cycle 12Abraxane  Chemo Toxicities: 1. Intermittent nausea for which she takes Zofran and Compazine around-the-clock 2. constipation: Take stool softeners 3. Alopecia 4. Mouthsore that was treated with acyclovir: Resolved 5. Chemotherapy-induced anemia: Hemoglobin 9.5 today 6. Chemotherapy-induced neutropenia: I reduced Abraxane dose with cycle 4 to 65 mg per square 7. Fatigue due to chemotherapy  Treatment plan: Adjuvant radiation followed by adjuvant antiestrogen therapy. Return to clinic at the end of radiation to discuss starting antiestrogen treatment.

## 2016-07-12 NOTE — Progress Notes (Signed)
 Patient Care Team: Gudena, Vinay, MD as PCP - General (Hematology and Oncology)  DIAGNOSIS:  Encounter Diagnosis  Name Primary?  . Malignant neoplasm of upper-outer quadrant of right female breast, unspecified estrogen receptor status (HCC) Yes    SUMMARY OF ONCOLOGIC HISTORY:   Breast cancer of upper-outer quadrant of right female breast (HCC)   09/02/2015 Mammogram    Right breast UOQ: 2 masses 3 cm apart, U/S they measured 3.4 cm, larger mass at 1.6 cm, in addition, 1 cm mass at 9:00 and 0.9 cm mass at 8:30( could be intramammary LN); right axillary LN 11 mm; microcalcs 10 cm span       09/09/2015 Initial Diagnosis    Right breast biopsy OUQ: DCIS with calcs and necrosis ER 95%, PR 80%; 9:30 position 5cmfn: IDC with DCIS ER 95%, PR 65%, Ki-67 20%,; right biopsy 8:30 position 3cmfn ER 95%, PR 5%, Ki-67 10%: intramammary LN with IDC      09/12/2015 Breast MRI    Multicentric right breast cancer cluster of enhancing masses 4.1 x 2.2 x 2.6 cm, LOQ: 1.1 cm enhancing mass previously biopsied, subcutaneous low right breast 2 cm mass not biopsied, multiple other small enhancing masses, 1 cm right axillary lymph node      10/01/2015 Surgery    Right mastectomy: IDC grade 2, multifocal largest 2.5 cm, with high-grade DCIS, broadly present at  anterior margin and inferior margin 2/5 LN positive, LVI Present,  Left mastectomy: ALH, ER 95%, PR 5-65%, HER-2 negative, Ki-67 10-20%, T2 N1 a stage IIB      11/05/2015 - 03/17/2016 Chemotherapy    Adjuvant chemotherapy with dose dense Adriamycin and Cytoxan 4 followed by Abraxane weekly 12      04/22/2016 - 06/08/2016 Radiation Therapy    Adjuvant radiation therapy      06/22/2016 Surgery    Hysterectomy with bilateral salpingo-oophorectomy: Negative for malignancy       CHIEF COMPLIANT: Follow-up after recent hysterectomy with bilateral salpingo-oophorectomy  INTERVAL HISTORY: Melanie Barber is a 50-year-old lady with above-mentioned history  of multicentric right breast cancer treated with mastectomy followed by adjuvant chemotherapy. She also completed adjuvant radiation. She underwent breast reconstruction. Recently she underwent hysterectomy bilateral salpingo-oophorectomy. Pathology came back as benign. She is here for surveillance checkup.  REVIEW OF SYSTEMS:   Constitutional: Denies fevers, chills or abnormal weight loss Eyes: Denies blurriness of vision Ears, nose, mouth, throat, and face: Denies mucositis or sore throat Respiratory: Denies cough, dyspnea or wheezes Cardiovascular: Denies palpitation, chest discomfort Gastrointestinal:  Denies nausea, heartburn or change in bowel habits Skin: Denies abnormal skin rashes Lymphatics: Denies new lymphadenopathy or easy bruising Neurological:Denies numbness, tingling or new weaknesses Behavioral/Psych: Mood is stable, no new changes  Extremities: No lower extremity edema Breast: Bilateral breast reconstruction All other systems were reviewed with the patient and are negative.  I have reviewed the past medical history, past surgical history, social history and family history with the patient and they are unchanged from previous note.  ALLERGIES:  is allergic to keflex [cephalexin] and decadron [dexamethasone].  MEDICATIONS:  Current Outpatient Prescriptions  Medication Sig Dispense Refill  . acetaminophen (TYLENOL) 500 MG tablet Take 1,000 mg by mouth 2 (two) times daily as needed for mild pain.     . anastrozole (ARIMIDEX) 1 MG tablet Take 1 tablet (1 mg total) by mouth daily. 90 tablet 3  . Atorvastatin Calcium (INVESTIGATIONAL ATORVASTATIN/PLACEBO) 40 MG tablet [id number] [id number] Take 1 tablet by mouth daily. Take   2 doses (these doses must be 12 hours apart) prior to first chemotherapy treatment. Then take 1 tablet daily by mouth with or without food. 180 tablet 0  . Atorvastatin Calcium (INVESTIGATIONAL ATORVASTATIN/PLACEBO) 40 MG tablet [id number] [id number] Take 1  tablet by mouth daily. Take 1 tablet daily with or without food. 180 tablet 0  . Biotin (BIOTIN 5000) 5 MG CAPS Take 10 mg by mouth daily.     . cyclobenzaprine (FLEXERIL) 10 MG tablet Take 1 tablet (10 mg total) by mouth 2 (two) times daily as needed for muscle spasms. 20 tablet 0  . Dermatological Products, Misc. (MIADERM RADIATION RELIEF) LOTN Apply 1 application topically at bedtime.    . hyaluronate sodium (RADIAPLEXRX) GEL Apply 1 application topically 2 (two) times daily.    . mineral oil-hydrophilic petrolatum (AQUAPHOR) ointment Apply 1 application topically as needed for dry skin or irritation.    . naproxen sodium (ANAPROX) 220 MG tablet Take 220 mg by mouth daily as needed (pain).    . non-metallic deodorant (ALRA) MISC Apply 1 application topically daily.    . oxyCODONE-acetaminophen (PERCOCET/ROXICET) 5-325 MG tablet Take 1-2 tablets by mouth every 4 (four) hours as needed (moderate to severe pain). 10 tablet 0  . Prenatal Vit-Fe Fumarate-FA (PRENATAL MULTIVITAMIN) TABS tablet Take 1 tablet by mouth daily at 12 noon.    . Probiotic Product (PRO-BIOTIC BLEND PO) Take 1 tablet by mouth 2 (two) times daily.     . PSYLLIUM HUSK PO Take 2 capsules by mouth daily.      No current facility-administered medications for this visit.     PHYSICAL EXAMINATION: ECOG PERFORMANCE STATUS: 1 - Symptomatic but completely ambulatory  Vitals:   07/12/16 0923  BP: 106/63  Pulse: 71  Resp: 18  Temp: 97.7 F (36.5 C)   Filed Weights   07/12/16 0923  Weight: 171 lb 8 oz (77.8 kg)    GENERAL:alert, no distress and comfortable SKIN: skin color, texture, turgor are normal, no rashes or significant lesions EYES: normal, Conjunctiva are pink and non-injected, sclera clear OROPHARYNX:no exudate, no erythema and lips, buccal mucosa, and tongue normal  NECK: supple, thyroid normal size, non-tender, without nodularity LYMPH:  no palpable lymphadenopathy in the cervical, axillary or inguinal LUNGS:  clear to auscultation and percussion with normal breathing effort HEART: regular rate & rhythm and no murmurs and no lower extremity edema ABDOMEN:abdomen soft, non-tender and normal bowel sounds MUSCULOSKELETAL:no cyanosis of digits and no clubbing  NEURO: alert & oriented x 3 with fluent speech, no focal motor/sensory deficits EXTREMITIES: No lower extremity edema  LABORATORY DATA:  I have reviewed the data as listed   Chemistry      Component Value Date/Time   NA 138 06/23/2016 0533   NA 140 04/27/2016 1035   K 4.3 06/23/2016 0533   K 4.0 04/27/2016 1035   CL 102 06/23/2016 0533   CO2 31 06/23/2016 0533   CO2 25 04/27/2016 1035   BUN 13 06/23/2016 0533   BUN 13.2 04/27/2016 1035   CREATININE 0.71 06/23/2016 0533   CREATININE 0.7 04/27/2016 1035      Component Value Date/Time   CALCIUM 8.3 (L) 06/23/2016 0533   CALCIUM 9.8 04/27/2016 1035   ALKPHOS 78 06/04/2016 1331   ALKPHOS 87 04/27/2016 1035   AST 26 06/04/2016 1331   AST 24 04/27/2016 1035   ALT 27 06/04/2016 1331   ALT 22 04/27/2016 1035   BILITOT 1.0 06/04/2016 1331   BILITOT 0.80 04/27/2016   1035       Lab Results  Component Value Date   WBC 3.8 (L) 06/23/2016   HGB 10.7 (L) 06/23/2016   HCT 32.1 (L) 06/23/2016   MCV 91.5 06/23/2016   PLT 107 (L) 06/23/2016   NEUTROABS 2.8 06/04/2016    ASSESSMENT & PLAN:  Breast cancer of upper-outer quadrant of right female breast ([place]) Bilateral mastectomies 10/01/2015 With reconstruction Right mastectomy: IDC grade 2, multifocal largest 2.5 cm, with high-grade DCIS, broadly present at anterior margin and inferior margin 2/5 LN positive, LVI Present,  Left mastectomy: ALH, ER 95%, PR 5-65%, HER-2 negative, Ki-67 10-20%,  Pathologic stage: T2 N1 a stage IIB Mammaprint: High risk CT-CAP and bone scan: No evidence of metastatic  disease ----------------------------------------------------------------------------------------------------------------------------------------------------------------- Treatment plan: 1. Adjuvant chemotherapy with dose dense Adriamycin and Cytoxan 4 followed by Abraxane weekly 12 ( patient is intolerant to steroids and hence we cannot use paclitaxel) started 11/05/2015 completed 03/17/2016 3. Followed by adjuvant radiation therapy Completed 06/08/2016 4. Followed by adjuvant antiestrogen therapy with anastrozole  7 years  PREVENT trial: [id number]  ----------------------------------------------------------------------------------------------------------------------------------------------------------------- Patient was counseled extensively about aromatase inhibitor therapy and I given a prescription for anastrozole 1 mg daily.  We discussed the risks and benefits of anti-estrogen therapy with aromatase inhibitors. These include but not limited to insomnia, hot flashes, mood changes, vaginal dryness, bone density loss, and weight gain. We strongly believe that the benefits far outweigh the risks. Patient understands these risks and consented to starting treatment. Planned treatment duration is 7 years.  Return to clinic in one month to assess tolerability to anastrozole. She would like to participate in clinical trial [name]. I will see her back in one month to make a decision on the clinical trial participation.  I spent 25 minutes talking to the patient of which more than half was spent in counseling and coordination of care.  No orders of the defined types were placed in this encounter.  The patient has a good understanding of the overall plan. she agrees with it. she will call with any problems that may develop before the next visit here.   Rulon Eisenmenger, MD 07/12/16

## 2016-07-12 NOTE — Progress Notes (Signed)
POSTOPERATIVE FOLLOW-UP Assessment:    51 y.o. year old with a history of breast cancer.   S/p robotic assisted hysterectomy and BSO on 06/22/16.   Plan: 1) Pathology reports reviewed today 2) Treatment counseling - I discussed the benign nature of her pathology She was given the opportunity to ask questions, which were answered to her satisfaction, and she is agreement with the above mentioned plan of care.  3)  Return to clinic on a prn basis   HPI:  Melanie Barber is a 51 y.o. year old No obstetric history on file. initially seen in consultation on 04/29/16 for a history of HR positive breast cancer.  She then underwent a robotic assisted total hysterectomy and BSO on 7/62/26 without complications.  Her postoperative course was uncomplicated.  Her final pathology revealed benign tubes, ovaries and uterus..  She is seen today for a postoperative check and to discuss her pathology results and ongoing plan.  Since discharge from the hospital, she is feeling well with some pelvic pressure only.  She has improving appetite, normal bowel and bladder function, and pain controlled with minimal PO medication. She has no other complaints today.    Review of systems: Constitutional:  She has no weight gain or weight loss. She has no fever or chills. Eyes: No blurred vision Ears, Nose, Mouth, Throat: No dizziness, headaches or changes in hearing. No mouth sores. Cardiovascular: No chest pain, palpitations or edema. Respiratory:  No shortness of breath, wheezing or cough Gastrointestinal: She has normal bowel movements without diarrhea or constipation. She denies any nausea or vomiting. She denies blood in her stool or heart burn. Genitourinary:  She denies pelvic pain, pelvic pressure or changes in her urinary function. She has no hematuria, dysuria, or incontinence. She has no irregular vaginal bleeding or vaginal discharge Musculoskeletal: Denies muscle weakness or joint pains.  Skin:  She has no  skin changes, rashes or itching Neurological:  Denies dizziness or headaches. No neuropathy, no numbness or tingling. Psychiatric:  She denies depression or anxiety. Hematologic/Lymphatic:   No easy bruising or bleeding   Physical Exam: There were no vitals taken for this visit. General: Well dressed, well nourished in no apparent distress.   HEENT:  Normocephalic and atraumatic, no lesions.  Extraocular muscles intact. Sclerae anicteric. Pupils equal, round, reactive. No mouth sores or ulcers. Thyroid is normal size, not nodular, midline. Abdomen:  Soft, nontender, nondistended.  No palpable masses.  No hepatosplenomegaly.  No ascites. Normal bowel sounds.  No hernias.  Incisions are well healed. Genitourinary: Normal EGBUS  Vaginal cuff intact.  No bleeding or discharge.  No cul de sac fullness. Extremities: No cyanosis, clubbing or edema.  No calf tenderness or erythema. No palpable cords. Psychiatric: Mood and affect are appropriate. Neurological: Awake, alert and oriented x 3. Sensation is intact, no neuropathy.  Musculoskeletal: No pain, normal strength and range of motion.  Donaciano Eva, MD

## 2016-07-13 DIAGNOSIS — E782 Mixed hyperlipidemia: Secondary | ICD-10-CM | POA: Diagnosis not present

## 2016-07-13 DIAGNOSIS — Z6827 Body mass index (BMI) 27.0-27.9, adult: Secondary | ICD-10-CM | POA: Diagnosis not present

## 2016-07-13 DIAGNOSIS — M549 Dorsalgia, unspecified: Secondary | ICD-10-CM | POA: Diagnosis not present

## 2016-07-13 DIAGNOSIS — C50919 Malignant neoplasm of unspecified site of unspecified female breast: Secondary | ICD-10-CM | POA: Diagnosis not present

## 2016-07-14 ENCOUNTER — Ambulatory Visit (HOSPITAL_COMMUNITY)
Admission: RE | Admit: 2016-07-14 | Discharge: 2016-07-14 | Disposition: A | Discharge status: Home / Self Care | Payer: 59 | Source: Ambulatory Visit | Attending: Hematology and Oncology | Admitting: Hematology and Oncology

## 2016-07-14 ENCOUNTER — Other Ambulatory Visit: Payer: Self-pay | Admitting: Physician Assistant

## 2016-07-14 DIAGNOSIS — N200 Calculus of kidney: Secondary | ICD-10-CM | POA: Diagnosis not present

## 2016-07-14 DIAGNOSIS — M79605 Pain in left leg: Secondary | ICD-10-CM | POA: Insufficient documentation

## 2016-07-14 DIAGNOSIS — K7689 Other specified diseases of liver: Secondary | ICD-10-CM | POA: Diagnosis not present

## 2016-07-14 DIAGNOSIS — M5127 Other intervertebral disc displacement, lumbosacral region: Secondary | ICD-10-CM | POA: Insufficient documentation

## 2016-07-14 DIAGNOSIS — C50411 Malignant neoplasm of upper-outer quadrant of right female breast: Secondary | ICD-10-CM | POA: Diagnosis not present

## 2016-07-14 DIAGNOSIS — Z170 Estrogen receptor positive status [ER+]: Secondary | ICD-10-CM | POA: Insufficient documentation

## 2016-07-14 DIAGNOSIS — D492 Neoplasm of unspecified behavior of bone, soft tissue, and skin: Secondary | ICD-10-CM | POA: Diagnosis not present

## 2016-07-14 MED ORDER — IOPAMIDOL (ISOVUE-300) INJECTION 61%
INTRAVENOUS | Status: AC
  Administered 2016-07-14: 100 mL
  Filled 2016-07-14: qty 100

## 2016-07-15 ENCOUNTER — Telehealth (HOSPITAL_COMMUNITY): Payer: Self-pay | Admitting: Vascular Surgery

## 2016-07-15 NOTE — Telephone Encounter (Signed)
Left pt second message to make f/u brst appt w/ echo w/ db in AUG

## 2016-07-19 DIAGNOSIS — R030 Elevated blood-pressure reading, without diagnosis of hypertension: Secondary | ICD-10-CM | POA: Diagnosis not present

## 2016-07-19 DIAGNOSIS — M5126 Other intervertebral disc displacement, lumbar region: Secondary | ICD-10-CM | POA: Diagnosis not present

## 2016-07-19 DIAGNOSIS — M5416 Radiculopathy, lumbar region: Secondary | ICD-10-CM | POA: Insufficient documentation

## 2016-07-19 DIAGNOSIS — Z6827 Body mass index (BMI) 27.0-27.9, adult: Secondary | ICD-10-CM | POA: Diagnosis not present

## 2016-07-20 ENCOUNTER — Encounter (HOSPITAL_COMMUNITY): Payer: Self-pay

## 2016-07-21 ENCOUNTER — Telehealth: Payer: Self-pay | Admitting: Hematology and Oncology

## 2016-07-21 ENCOUNTER — Encounter: Payer: Self-pay | Admitting: *Deleted

## 2016-07-21 NOTE — Telephone Encounter (Signed)
Faxed records to Dr. Ernie Hew

## 2016-07-22 ENCOUNTER — Ambulatory Visit
Admission: RE | Admit: 2016-07-22 | Discharge: 2016-07-22 | Disposition: A | Discharge status: Home / Self Care | Payer: 59 | Source: Ambulatory Visit | Attending: Radiation Oncology | Admitting: Radiation Oncology

## 2016-07-22 ENCOUNTER — Encounter: Payer: Self-pay | Admitting: Radiation Oncology

## 2016-07-22 VITALS — BP 105/84 | HR 88 | Temp 98.2°F | Resp 18 | Ht 67.0 in | Wt 175.4 lb

## 2016-07-22 DIAGNOSIS — D0581 Other specified type of carcinoma in situ of right breast: Secondary | ICD-10-CM | POA: Insufficient documentation

## 2016-07-22 DIAGNOSIS — R5383 Other fatigue: Secondary | ICD-10-CM | POA: Insufficient documentation

## 2016-07-22 DIAGNOSIS — H524 Presbyopia: Secondary | ICD-10-CM | POA: Diagnosis not present

## 2016-07-22 DIAGNOSIS — C50411 Malignant neoplasm of upper-outer quadrant of right female breast: Secondary | ICD-10-CM

## 2016-07-22 NOTE — Addendum Note (Signed)
Encounter addended by: Malena Edman, RN on: 07/22/2016  3:31 PM<BR>    Actions taken: Charge Capture section accepted

## 2016-07-22 NOTE — Progress Notes (Signed)
Radiation Oncology         [phone] ________________________________  Name: Melanie Barber MRN: [id number]  Date: 07/22/2016  DOB: 27-Feb-1965  Post Treatment Note  CC: Fanny Bien, MD  Rolm Bookbinder, MD  Diagnosis:   Stage IIB, T2, N1a ER/PR positive invasive ductal carcinoma with DCIS of the right breast.     Interval Since Last Radiation:  6 weeks   04/22/2016 to 06/08/2016:   1. 4 field Right breast was treated to 50.4 Gy in 28 fractions at 1.8 Gy per fraction. 2. The Right breast was boosted to 10 Gy in 5 fractions at 2 Gy per fraction.  Narrative:  The patient returns today for routine follow-up. During treatment she did very well with radiotherapy and did not have significant desquamation. The patient tolerated this well, and subsequently has also undergone robotic hysterectomy with BSO with Dr. Denman George. This was performed on 06/21/2016 and her pathology was benign.                            On review of systems, the patient states she's been doing well since radiotherapy and since her hysterectomy. She did complete some fatigue related to her previous treatment, but states that overall she is doing quite well and is looking to go back to work the first week of August. She states that she has not had any concerns with her skin is the week after her treatment completed.  ALLERGIES:  is allergic to keflex [cephalexin] and decadron [dexamethasone].  Meds: Current Outpatient Prescriptions  Medication Sig Dispense Refill  . anastrozole (ARIMIDEX) 1 MG tablet Take 1 tablet (1 mg total) by mouth daily. 90 tablet 3  . Atorvastatin Calcium (INVESTIGATIONAL ATORVASTATIN/PLACEBO) 40 MG tablet [facility] [address] Take 1 tablet by mouth daily. Take 2 doses (these doses must be 12 hours apart) prior to first chemotherapy treatment. Then take 1 tablet daily by mouth with or without food. 180 tablet 0  . Atorvastatin Calcium (INVESTIGATIONAL ATORVASTATIN/PLACEBO) 40 MG tablet  [facility] [address] Take 1 tablet by mouth daily. Take 1 tablet daily with or without food. 180 tablet 0  . Biotin (BIOTIN 5000) 5 MG CAPS Take 10 mg by mouth daily.     . cyclobenzaprine (FLEXERIL) 10 MG tablet Take 1 tablet (10 mg total) by mouth 2 (two) times daily as needed for muscle spasms. 20 tablet 0  . naproxen sodium (ANAPROX) 220 MG tablet Take 220 mg by mouth daily as needed (pain).    . Prenatal Vit-Fe Fumarate-FA (PRENATAL MULTIVITAMIN) TABS tablet Take 1 tablet by mouth daily at 12 noon.    . Probiotic Product (PRO-BIOTIC BLEND PO) Take 1 tablet by mouth 2 (two) times daily.     Marland Kitchen acetaminophen (TYLENOL) 500 MG tablet Take 1,000 mg by mouth 2 (two) times daily as needed for mild pain.     Marland Kitchen oxyCODONE-acetaminophen (PERCOCET/ROXICET) 5-325 MG tablet Take 1-2 tablets by mouth every 4 (four) hours as needed (moderate to severe pain). (Patient not taking: Reported on 07/22/2016) 10 tablet 0  . PSYLLIUM HUSK PO Take 2 capsules by mouth daily.      No current facility-administered medications for this encounter.     Physical Findings:  height is 5\' 7"  (1.702 m) and weight is 175 lb 6.4 oz (79.6 kg). Her oral temperature is 98.2 F (36.8 C). Her blood pressure is 105/84 and her pulse is 88. Her respiration is  18 and oxygen saturation is 98%.  Pain Assessment Pain Score: 0-No pain/10 In general this is a well appearing caucasian female in no acute distress. She's alert and oriented x4 and appropriate throughout the examination. Cardiopulmonary assessment is negative for acute distress and she exhibits normal effort. The Right breast/chest wall was examined and reveals no evidence of desquamation or dermatitis at this time. No hyperpigmentation is noted.   Lab Findings: Lab Results  Component Value Date   WBC 3.8 (L) 06/23/2016   HGB 10.7 (L) 06/23/2016   HCT 32.1 (L) 06/23/2016   MCV 91.5 06/23/2016   PLT 107 (L) 06/23/2016     Radiographic Findings: Ct Abdomen Pelvis W  Contrast  Result Date: 07/14/2016 CLINICAL DATA:  History of breast cancer in 2017. Increasing back and left leg pain over the past month. Evaluate for possible metastatic disease. EXAM: CT ABDOMEN AND PELVIS WITH CONTRAST TECHNIQUE: Multidetector CT imaging of the abdomen and pelvis was performed using the standard protocol following bolus administration of intravenous contrast. CONTRAST:  168mL ISOVUE-300 IOPAMIDOL (ISOVUE-300) INJECTION 61% COMPARISON:  CT scan 09/18/2015 FINDINGS: Lower chest: Bilateral breast prosthesis are noted. No chest wall mass. Heart is normal in size. No pericardial effusion. The lungs are clear. No worrisome pulmonary nodules to suggest pulmonary metastatic disease. The distal esophagus is grossly normal. Hepatobiliary: Stable tiny low-attenuation left hepatic lobe lesion is stable and consistent with a benign cyst. No worrisome hepatic lesions or intrahepatic biliary dilatation. The gallbladder appears normal. No common bile duct dilatation. Pancreas: No mass, inflammation or ductal dilatation. Spleen: Normal size.  No focal lesions. Adrenals/Urinary Tract: The adrenal glands are normal in stable. Small left renal calculus but no obstructing ureteral calculi or bladder calculi. Vague density in the midpole region of the right kidney could be a calculus or possible vessel. No change since prior study. Stomach/Bowel: The stomach, duodenum, small bowel and colon are grossly normal. No inflammatory changes, mass lesions or obstructive findings. The terminal ileum is normal. The appendix is not identified for certain. Vascular/Lymphatic: The aorta is normal in caliber. No dissection. The branch vessels are patent. The major venous structures are patent. No mesenteric or retroperitoneal mass or adenopathy. Small scattered lymph nodes are noted. Reproductive: The uterus and ovaries are surgically absent. Other: No pelvic mass or adenopathy. No free pelvic fluid collections. No inguinal mass  or adenopathy. No abdominal wall hernia or subcutaneous lesions. Musculoskeletal: No obvious bone lesions to suggest metastatic disease. The disc spaces are maintained. The facets are normally aligned. No pars defects. There is a moderate-sized left paracentral disc protrusion at L5-S1 which could potentially be symptomatic. MRI lumbar spine without contrast may be helpful for further evaluation. IMPRESSION: 1. Moderate-sized left paracentral disc protrusion at L5-S1 could be responsible for the patient's back and left leg pain. 2. No CT findings to suggest metastatic disease involving the lower lungs, abdomen, pelvis or bony structures. 3. Stable left hepatic lobe cyst. 4. Stable left renal calculus. Electronically Signed   By: Marijo Sanes M.D.   On: 07/14/2016 17:07    Impression/Plan: 1. Stage IIB, T2, N1a ER/PR positive invasive ductal carcinoma with DCIS of the right breast.. The patient has been doing well since completion of radiotherapy. We discussed that we would be happy to continue to follow her as needed, but she will also continue to follow up with Dr. Lindi Adie in medical oncology. She was counseled on skin care as well as measures to avoid sun exposure to this area.  2. Survivorship.  the patient has a follow-up appointment in the survivorship clinic. She is counseled on the importance of this and we will follow her regarding this as well as needed. 3. Treatment related fatigue. The patient is given information for the live strong program and will contact to [place] to see if there is a class closer to home near [place], [place].    Carola Rhine, PAC

## 2016-07-23 ENCOUNTER — Other Ambulatory Visit: Payer: Self-pay | Admitting: *Deleted

## 2016-07-23 DIAGNOSIS — Z170 Estrogen receptor positive status [ER+]: Principal | ICD-10-CM

## 2016-07-23 DIAGNOSIS — C50411 Malignant neoplasm of upper-outer quadrant of right female breast: Secondary | ICD-10-CM

## 2016-07-27 ENCOUNTER — Encounter: Payer: Self-pay | Admitting: Sports Medicine

## 2016-07-27 ENCOUNTER — Ambulatory Visit (INDEPENDENT_AMBULATORY_CARE_PROVIDER_SITE_OTHER): Payer: 59 | Admitting: Sports Medicine

## 2016-07-27 ENCOUNTER — Telehealth: Payer: Self-pay | Admitting: Hematology and Oncology

## 2016-07-27 DIAGNOSIS — M6281 Muscle weakness (generalized): Secondary | ICD-10-CM | POA: Diagnosis not present

## 2016-07-27 DIAGNOSIS — M79674 Pain in right toe(s): Secondary | ICD-10-CM | POA: Diagnosis not present

## 2016-07-27 DIAGNOSIS — M79675 Pain in left toe(s): Secondary | ICD-10-CM | POA: Diagnosis not present

## 2016-07-27 DIAGNOSIS — M545 Low back pain: Secondary | ICD-10-CM | POA: Diagnosis not present

## 2016-07-27 DIAGNOSIS — B351 Tinea unguium: Secondary | ICD-10-CM | POA: Diagnosis not present

## 2016-07-27 DIAGNOSIS — M5417 Radiculopathy, lumbosacral region: Secondary | ICD-10-CM | POA: Diagnosis not present

## 2016-07-27 DIAGNOSIS — M2560 Stiffness of unspecified joint, not elsewhere classified: Secondary | ICD-10-CM | POA: Diagnosis not present

## 2016-07-27 NOTE — Telephone Encounter (Signed)
Faxed records to dr dewey [phone]

## 2016-07-27 NOTE — Progress Notes (Signed)
Subjective: Melanie Barber is a 51 y.o. female patient seen today in office for fungal culture results. Patient has no other pedal complaints at this time.   Patient Active Problem List   Diagnosis Date Noted  . History of bilateral breast cancer 06/22/2016  . Genetic testing 11/17/2015  . Malignant neoplasm of upper-outer quadrant of right breast in female, estrogen receptor positive ([place]) 10/09/2015  . Breast cancer of upper-outer quadrant of right female breast ([place]) 09/12/2015    Current Outpatient Prescriptions on File Prior to Visit  Medication Sig Dispense Refill  . acetaminophen (TYLENOL) 500 MG tablet Take 1,000 mg by mouth 2 (two) times daily as needed for mild pain.     Marland Kitchen anastrozole (ARIMIDEX) 1 MG tablet Take 1 tablet (1 mg total) by mouth daily. 90 tablet 3  . Atorvastatin Calcium (INVESTIGATIONAL ATORVASTATIN/PLACEBO) 40 MG tablet [facility] [address] Take 1 tablet by mouth daily. Take 2 doses (these doses must be 12 hours apart) prior to first chemotherapy treatment. Then take 1 tablet daily by mouth with or without food. 180 tablet 0  . Atorvastatin Calcium (INVESTIGATIONAL ATORVASTATIN/PLACEBO) 40 MG tablet [facility] [address] Take 1 tablet by mouth daily. Take 1 tablet daily with or without food. 180 tablet 0  . Biotin (BIOTIN 5000) 5 MG CAPS Take 10 mg by mouth daily.     . cyclobenzaprine (FLEXERIL) 10 MG tablet Take 1 tablet (10 mg total) by mouth 2 (two) times daily as needed for muscle spasms. 20 tablet 0  . naproxen sodium (ANAPROX) 220 MG tablet Take 220 mg by mouth daily as needed (pain).    Marland Kitchen oxyCODONE-acetaminophen (PERCOCET/ROXICET) 5-325 MG tablet Take 1-2 tablets by mouth every 4 (four) hours as needed (moderate to severe pain). 10 tablet 0  . Prenatal Vit-Fe Fumarate-FA (PRENATAL MULTIVITAMIN) TABS tablet Take 1 tablet by mouth daily at 12 noon.    . Probiotic Product (PRO-BIOTIC BLEND PO) Take 1 tablet by mouth 2 (two) times daily.     . PSYLLIUM HUSK PO  Take 2 capsules by mouth daily.      No current facility-administered medications on file prior to visit.     Allergies  Allergen Reactions  . Keflex [Cephalexin] Anaphylaxis  . Decadron [Dexamethasone] Anxiety    Beyond hyper    Objective: Physical Exam  General: Well developed, nourished, no acute distress, awake, alert and oriented x 3  Vascular: Dorsalis pedis artery 2/4 bilateral, Posterior tibial artery 2/4 bilateral, skin temperature warm to warm proximal to distal bilateral lower extremities, no varicosities, pedal hair present bilateral.  Neurological: Gross sensation present via light touch bilateral.   Dermatological: Skin is warm, dry, and supple bilateral, Nails 1-10 are tender, short thick, and discolored with mild subungal debris, no webspace macerations present bilateral, no open lesions present bilateral, no callus/corns/hyperkeratotic tissue present bilateral. No signs of infection bilateral.  Musculoskeletal: No symptomatic boney deformities noted bilateral. Muscular strength within normal limits without pain on range of motion. No pain with calf compression bilateral.  Fungal culture + T. Rubrum  Assessment and Plan:  Problem List Items Addressed This Visit    None    Visit Diagnoses    Dermatophytosis of nail    -  Primary   Toe pain, bilateral         -Examined patient -Discussed treatment options for painful mycotic nails -Patient opt for oral Lamisil with full understanding of medication risks; reviewed LFTs from 06/2016 however will discuss with Dr. Lindi Adie,  oncologist to see if it is safe/no contraindication -Patient opt for topical as well; Rx request made for topical from [place] good hygiene habits -Patient to return in 6 weeks for follow up evaluation or sooner if symptoms worsen.  Landis Martins, DPM

## 2016-07-28 ENCOUNTER — Telehealth: Payer: Self-pay | Admitting: *Deleted

## 2016-07-28 MED ORDER — NONFORMULARY OR COMPOUNDED ITEM: 2 refills | Status: DC

## 2016-07-28 NOTE — Telephone Encounter (Addendum)
-----   Message from Landis Martins, [place] sent at 07/27/2016  2:45 PM EDT ----- Regarding: [facility] topical  Topical for nail fungus. 07/28/2016-Faxed orders to [facility] [facility] [employer].

## 2016-07-29 ENCOUNTER — Telehealth: Payer: Self-pay | Admitting: *Deleted

## 2016-07-29 MED ORDER — TERBINAFINE HCL 250 MG PO TABS: 250.0000 mg | ORAL_TABLET | Freq: Every day | ORAL | 0 refills | Status: DC

## 2016-07-29 MED FILL — TERBINAFINE HCL 250 MG TAB: 250 | 90 days supply | Qty: 90 | Fill #0

## 2016-07-29 NOTE — Telephone Encounter (Addendum)
-----   Message from Landis Martins, [place] sent at 07/28/2016  5:57 PM EDT ----- Will you let patient know that her oncologist gave the approval to start Lamisil. Send to her pharmacy Lamisil 250mg  PO daily x 90 tabs. I will see her in 6 weeks for medication check -Dr. Cannon Kettle.07/29/2016-I informed pt of Dr. Cannon Kettle orders and sent Lamisil to [place] and transferred pt to schedulers to set up 6 week appt. ----- Message ----- From: Nicholas Lose, MD Sent: 07/28/2016   5:31 PM To: Landis Martins, DPM  Please treat her. No issues from my side Vinay ----- Message ----- From: Landis Martins, DPM Sent: 07/27/2016   4:53 PM To: Nicholas Lose, MD  I am treating Elmyra Ricks for nail fungus. She has a positive fungal culture for T. Rubrum. I would like to start patient on Lamisil 250mg  po daily x 90 days. I have reviewed her last CMP and liver panel is normal. Please let me know if in your opinion if this will be a safe option with no contraindications to her treatments for breast CA. Thanks Dr. Cannon Kettle

## 2016-08-03 DIAGNOSIS — M545 Low back pain: Secondary | ICD-10-CM | POA: Diagnosis not present

## 2016-08-03 DIAGNOSIS — M6281 Muscle weakness (generalized): Secondary | ICD-10-CM | POA: Diagnosis not present

## 2016-08-03 DIAGNOSIS — M2560 Stiffness of unspecified joint, not elsewhere classified: Secondary | ICD-10-CM | POA: Diagnosis not present

## 2016-08-03 DIAGNOSIS — M5417 Radiculopathy, lumbosacral region: Secondary | ICD-10-CM | POA: Diagnosis not present

## 2016-08-04 DIAGNOSIS — Z923 Personal history of irradiation: Secondary | ICD-10-CM | POA: Insufficient documentation

## 2016-08-05 DIAGNOSIS — Z170 Estrogen receptor positive status [ER+]: Secondary | ICD-10-CM | POA: Diagnosis not present

## 2016-08-05 DIAGNOSIS — Z923 Personal history of irradiation: Secondary | ICD-10-CM | POA: Diagnosis not present

## 2016-08-05 DIAGNOSIS — Z9013 Acquired absence of bilateral breasts and nipples: Secondary | ICD-10-CM | POA: Diagnosis not present

## 2016-08-05 DIAGNOSIS — C50411 Malignant neoplasm of upper-outer quadrant of right female breast: Secondary | ICD-10-CM | POA: Diagnosis not present

## 2016-08-11 ENCOUNTER — Encounter (HOSPITAL_COMMUNITY): Payer: Self-pay | Admitting: Internal Medicine

## 2016-08-11 ENCOUNTER — Ambulatory Visit (HOSPITAL_BASED_OUTPATIENT_CLINIC_OR_DEPARTMENT_OTHER)
Admission: RE | Admit: 2016-08-11 | Discharge: 2016-08-11 | Disposition: A | Discharge status: Home / Self Care | Payer: 59 | Source: Ambulatory Visit

## 2016-08-11 ENCOUNTER — Ambulatory Visit: Payer: Self-pay | Admitting: Radiation Oncology

## 2016-08-11 ENCOUNTER — Ambulatory Visit (HOSPITAL_COMMUNITY)
Admission: RE | Admit: 2016-08-11 | Discharge: 2016-08-11 | Disposition: A | Discharge status: Home / Self Care | Payer: 59 | Source: Ambulatory Visit | Attending: Internal Medicine | Admitting: Internal Medicine

## 2016-08-11 VITALS — BP 128/86 | HR 67 | Wt 175.8 lb

## 2016-08-11 DIAGNOSIS — M13861 Other specified arthritis, right knee: Secondary | ICD-10-CM | POA: Diagnosis not present

## 2016-08-11 DIAGNOSIS — Z170 Estrogen receptor positive status [ER+]: Secondary | ICD-10-CM | POA: Insufficient documentation

## 2016-08-11 DIAGNOSIS — Z79899 Other long term (current) drug therapy: Secondary | ICD-10-CM | POA: Insufficient documentation

## 2016-08-11 DIAGNOSIS — Z09 Encounter for follow-up examination after completed treatment for conditions other than malignant neoplasm: Secondary | ICD-10-CM | POA: Insufficient documentation

## 2016-08-11 DIAGNOSIS — C50411 Malignant neoplasm of upper-outer quadrant of right female breast: Secondary | ICD-10-CM | POA: Diagnosis not present

## 2016-08-11 NOTE — Addendum Note (Signed)
Encounter addended by: Scarlette Calico, RN on: 08/11/2016 10:13 AM<BR>    Actions taken: Order list changed, Diagnosis association updated

## 2016-08-11 NOTE — Addendum Note (Signed)
Encounter addended by: Scarlette Calico, RN on: 08/11/2016 10:07 AM<BR>    Actions taken: Sign clinical note

## 2016-08-11 NOTE — Patient Instructions (Signed)
We will contact you in 6 months to schedule your next appointment and echocardiogram  

## 2016-08-11 NOTE — Progress Notes (Signed)
  Echocardiogram 2D Echocardiogram has been performed.  Melanie Barber M 08/11/2016, 8:55 AM

## 2016-08-11 NOTE — Progress Notes (Signed)
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 Lindi Adie to the cardio-oncology clinic.   SUMMARY OF ONCOLOGIC HISTORY:       Breast cancer of upper-outer quadrant of right female breast ([place])   09/02/2015 Mammogram    Right breast UOQ: 2 masses 3 cm apart, U/S they measured 3.4 cm, larger mass at 1.6 cm, in addition, 1 cm mass at 9:00 and 0.9 cm mass at 8:30( could be intramammary LN); right axillary LN 11 mm; microcalcs 10 cm span       09/09/2015 Initial Diagnosis    Right breast biopsy OUQ: DCIS with calcs and necrosis ER 95%, PR 80%; 9:30 position 5cmfn: IDC with DCIS ER 95%, PR 65%, Ki-67 20%,; right biopsy 8:30 position 3cmfn ER 95%, PR 5%, Ki-67 10%: intramammary LN with IDC      09/12/2015 Breast MRI    Multicentric right breast cancer cluster of enhancing masses 4.1 x 2.2 x 2.6 cm, LOQ: 1.1 cm enhancing mass previously biopsied, subcutaneous low right breast 2 cm mass not biopsied, multiple other small enhancing masses, 1 cm right axillary lymph node      10/01/2015 Surgery    Right mastectomy: IDC grade 2, multifocal largest 2.5 cm, with high-grade DCIS, broadly present at  anterior margin and inferior margin 2/5 LN positive, LVI Present,  Left mastectomy: ALH, ER 95%, PR 5-65%, HER-2 negative, Ki-67 10-20%, T2 N1 a stage IIB      11/05/2015 - 03/17/2016 Chemotherapy    Adjuvant chemotherapy with dose dense Adriamycin and Cytoxan 4 followed by Abraxane weekly 12      04/22/2016 - 06/08/2016 Radiation Therapy    Adjuvant radiation therapy      06/22/2016 Surgery    Hysterectomy with bilateral salpingo-oophorectomy: Negative for malignancy           Returns today for follow up. Finished Adriamycin in January 2018. Overall feeling well, she is still exercising daily. Walking up to 6 miles/day. No SOB, orthopnea. Denies  lower extremity edema. Weight stable. Eating well. No HF symptoms. She is planning to enter a study where she takes [place] (palbociclib) and wants to know if there is a risk for cardio toxicity with this.     ECHO 02/26/16: EF 60% lateral s' 12.7 GLS -21.7% Echo 08/11/16: EF Ef 60%, lateral s' 13.2  GLS -19.3 %  (reviewed personally)    Review of Systems: [y] = yes, '[ ]'$  = no   General: Weight gain '[ ]'$ ; Weight loss '[ ]'$ ; Anorexia '[ ]'$ ; Fatigue '[ ]'$ ; Fever '[ ]'$ ; Chills '[ ]'$ ; Weakness '[ ]'$   Cardiac: Chest pain/pressure '[ ]'$ ; Resting SOB '[ ]'$ ; Exertional SOB '[ ]'$ ; Orthopnea '[ ]'$ ; Pedal Edema '[ ]'$ ; Palpitations '[ ]'$ ; Syncope '[ ]'$ ; Presyncope '[ ]'$ ; Paroxysmal nocturnal dyspnea'[ ]'$   Pulmonary: Cough '[ ]'$ ; Wheezing'[ ]'$ ; Hemoptysis'[ ]'$ ; Sputum '[ ]'$ ; Snoring '[ ]'$   GI: Vomiting'[ ]'$ ; Dysphagia'[ ]'$ ; Melena'[ ]'$ ; Hematochezia '[ ]'$ ; Heartburn'[ ]'$ ; Abdominal pain '[ ]'$ ; Constipation '[ ]'$ ; Diarrhea '[ ]'$ ; BRBPR '[ ]'$   GU: Hematuria'[ ]'$ ; Dysuria '[ ]'$ ; Nocturia'[ ]'$   Vascular: Pain in legs with walking '[ ]'$ ; Pain in feet with lying flat '[ ]'$ ; Non-healing sores '[ ]'$ ; Stroke '[ ]'$ ; TIA '[ ]'$ ; Slurred speech '[ ]'$ ;  Neuro: Headaches'[ ]'$ ; Vertigo'[ ]'$ ; Seizures'[ ]'$ ; Paresthesias'[ ]'$ ;Blurred vision '[ ]'$ ; Diplopia '[ ]'$ ; Vision changes '[ ]'$   Ortho/Skin: Arthritis '[ ]'$ ; Joint pain '[ ]'$ ; Muscle pain '[ ]'$ ; Joint swelling '[ ]'$ ; Back Pain '[ ]'$ ; Rash '[ ]'$   Psych: Depression'[ ]'$ ; Anxiety'[ ]'$   Heme: Bleeding problems '[ ]'$ ; Clotting disorders '[ ]'$ ; Anemia '[ ]'$   Endocrine: Diabetes '[ ]'$ ; Thyroid dysfunction'[ ]'$    Past Medical History:  Diagnosis Date  . Arthritis    right knee (10/01/2015)  . Cancer of right breast ([place])   . PONV (postoperative nausea and vomiting)   . Sciatic leg pain    left leg    Current Outpatient Prescriptions  Medication Sig Dispense Refill  . acetaminophen (TYLENOL) 500 MG tablet Take 1,000 mg by mouth 2 (two) times daily as needed for mild pain.     Marland Kitchen anastrozole (ARIMIDEX) 1 MG tablet Take 1 tablet (1 mg total) by mouth daily. 90 tablet 3  . Atorvastatin  Calcium (INVESTIGATIONAL ATORVASTATIN/PLACEBO) 40 MG tablet [facility] [address] Take 1 tablet by mouth daily. Take 1 tablet daily with or without food. 180 tablet 0  . Biotin (BIOTIN 5000) 5 MG CAPS Take 10 mg by mouth daily.     . cyclobenzaprine (FLEXERIL) 10 MG tablet Take 1 tablet (10 mg total) by mouth 2 (two) times daily as needed for muscle spasms. 20 tablet 0  . naproxen sodium (ANAPROX) 220 MG tablet Take 220 mg by mouth daily as needed (pain).    . NONFORMULARY OR COMPOUNDED ITEM [facility]: Onychomycosis Nail Lacquer - Fluconazole 2%, Terbinafine 1%, DMSO, apply to affected area daily. 120 each 2  . oxyCODONE-acetaminophen (PERCOCET/ROXICET) 5-325 MG tablet Take 1-2 tablets by mouth every 4 (four) hours as needed (moderate to severe pain). 10 tablet 0  . Prenatal Vit-Fe Fumarate-FA (PRENATAL MULTIVITAMIN) TABS tablet Take 1 tablet by mouth daily at 12 noon.    . Probiotic Product (PRO-BIOTIC BLEND PO) Take 1 tablet by mouth 2 (two) times daily.     . PSYLLIUM HUSK PO Take 2 capsules by mouth daily.     Marland Kitchen terbinafine (LAMISIL) 250 MG tablet Take 1 tablet (250 mg total) by mouth daily. 90 tablet 0   No current facility-administered medications for this encounter.     Allergies  Allergen Reactions  . Keflex [Cephalexin] Anaphylaxis  . Decadron [Dexamethasone] Anxiety    Beyond hyper      Social History   Social History  . Marital status: Divorced    Spouse name: N/A  . Number of children: N/A  . Years of education: N/A   Occupational History  . Not on file.   Social History Main Topics  . Smoking status: Never Smoker  . Smokeless tobacco: Never Used  . Alcohol use Yes     Comment: socially  . Drug use: No  . Sexual activity: Yes    Birth control/ protection: IUD   Other Topics Concern  . Not on file   Social History Narrative  . No narrative on file      Family History  Problem Relation Age of Onset  . Depression Mother   . Inflammatory bowel  disease Mother   . Alcohol abuse Mother   . Bipolar disorder Mother   . Hypertension Son   . Other Brother        severe intellectual disabilities  . Arthritis Maternal Uncle        hx knee replacement surgeries  . Stroke Maternal Grandmother 86  . Breast cancer Maternal Grandmother        dx before menopause  .  Uterine cancer Maternal Grandmother        dx late 39s  . Bipolar disorder Maternal Grandfather   . Stroke Paternal Grandmother 32  . Diabetes Paternal Grandmother        later in life  . Heart attack Paternal Grandfather     Vitals:   08/11/16 0923  BP: 128/86  Pulse: 67  SpO2: 98%  Weight: 175 lb 12.8 oz (79.7 kg)    PHYSICAL EXAM: General: Well appearing. No resp difficulty. HEENT: Normal Neck: Supple. JVP 5-6. Carotids 2+ bilat; no bruits. No thyromegaly or nodule noted. Cor: PMI nondisplaced. RRR, No M/G/R noted Lungs: CTAB, normal effort. Abdomen: Soft, non-tender, non-distended, no HSM. No bruits or masses. +BS  Extremities: No cyanosis, clubbing, rash, R and LLE no edema.  Neuro: Alert & orientedx3, cranial nerves grossly intact. moves all 4 extremities w/o difficulty. Affect pleasant   ASSESSMENT & PLAN: 1. R breast CA (ER/PR+) --has completed neoadjuvant chemo including adriamycin in 1/18. Continues anastrozole.   - s/p hysterectomy in June 2018 - EF remains normal  - No cardiotoxicity with Ibrance, she is going to take 3 weeks on, 1 week off. She is at high risk for breast CA recurrence.  Arbutus Leas, NP-C 9:31 AM  Patient seen and examined with Jettie Booze, NP. We discussed all aspects of the encounter. I agree with the assessment and plan as stated above.   Doing well. EF stable 6 months after Adriamycin. I reviewed echos personally. EF and Doppler parameters stable. No HF on exam. Will see back in 6 months for repeat echo to ensure stability. I explained incidence of Adriamycin cardiotoxicity in detail include small possibility of very  delayed toxicity.    No significant risk of cardiotoxicity with Ibrance OK to proceed from our standpoint.   Will see back in 6 months with repeat echoo.   Glori Bickers, MD  10:02 AM

## 2016-08-12 ENCOUNTER — Other Ambulatory Visit (HOSPITAL_BASED_OUTPATIENT_CLINIC_OR_DEPARTMENT_OTHER): Payer: 59

## 2016-08-12 ENCOUNTER — Encounter: Payer: Self-pay | Admitting: Hematology and Oncology

## 2016-08-12 ENCOUNTER — Encounter: Payer: 59 | Admitting: *Deleted

## 2016-08-12 ENCOUNTER — Ambulatory Visit: Payer: 59 | Admitting: Hematology and Oncology

## 2016-08-12 ENCOUNTER — Ambulatory Visit (HOSPITAL_BASED_OUTPATIENT_CLINIC_OR_DEPARTMENT_OTHER): Payer: 59 | Admitting: Hematology and Oncology

## 2016-08-12 ENCOUNTER — Encounter: Payer: Self-pay | Admitting: *Deleted

## 2016-08-12 DIAGNOSIS — Z9011 Acquired absence of right breast and nipple: Secondary | ICD-10-CM | POA: Diagnosis not present

## 2016-08-12 DIAGNOSIS — M5417 Radiculopathy, lumbosacral region: Secondary | ICD-10-CM | POA: Diagnosis not present

## 2016-08-12 DIAGNOSIS — M6281 Muscle weakness (generalized): Secondary | ICD-10-CM | POA: Diagnosis not present

## 2016-08-12 DIAGNOSIS — Z006 Encounter for examination for normal comparison and control in clinical research program: Secondary | ICD-10-CM | POA: Diagnosis not present

## 2016-08-12 DIAGNOSIS — Z170 Estrogen receptor positive status [ER+]: Secondary | ICD-10-CM

## 2016-08-12 DIAGNOSIS — Z9221 Personal history of antineoplastic chemotherapy: Secondary | ICD-10-CM

## 2016-08-12 DIAGNOSIS — C50411 Malignant neoplasm of upper-outer quadrant of right female breast: Secondary | ICD-10-CM | POA: Diagnosis not present

## 2016-08-12 DIAGNOSIS — Z79811 Long term (current) use of aromatase inhibitors: Secondary | ICD-10-CM

## 2016-08-12 DIAGNOSIS — Z923 Personal history of irradiation: Secondary | ICD-10-CM

## 2016-08-12 DIAGNOSIS — M545 Low back pain: Secondary | ICD-10-CM | POA: Diagnosis not present

## 2016-08-12 DIAGNOSIS — M2560 Stiffness of unspecified joint, not elsewhere classified: Secondary | ICD-10-CM | POA: Diagnosis not present

## 2016-08-12 LAB — COMPREHENSIVE METABOLIC PANEL
ALT: 37 U/L (ref 0–55)
ANION GAP: 9 meq/L (ref 3–11)
AST: 36 U/L — ABNORMAL HIGH (ref 5–34)
Albumin: 4 g/dL (ref 3.5–5.0)
Alkaline Phosphatase: 95 U/L (ref 40–150)
BUN: 13.7 mg/dL (ref 7.0–26.0)
CHLORIDE: 105 meq/L (ref 98–109)
CO2: 26 meq/L (ref 22–29)
Calcium: 9.6 mg/dL (ref 8.4–10.4)
Creatinine: 0.8 mg/dL (ref 0.6–1.1)
Glucose: 107 mg/dl (ref 70–140)
POTASSIUM: 4.4 meq/L (ref 3.5–5.1)
Sodium: 139 mEq/L (ref 136–145)
Total Bilirubin: 0.73 mg/dL (ref 0.20–1.20)
Total Protein: 7.2 g/dL (ref 6.4–8.3)

## 2016-08-12 LAB — CBC WITH DIFFERENTIAL/PLATELET
BASO%: 0.2 % (ref 0.0–2.0)
BASOS ABS: 0 10*3/uL (ref 0.0–0.1)
EOS%: 1.8 % (ref 0.0–7.0)
Eosinophils Absolute: 0.1 10*3/uL (ref 0.0–0.5)
HCT: 38.5 % (ref 34.8–46.6)
HGB: 13.2 g/dL (ref 11.6–15.9)
LYMPH%: 13 % — AB (ref 14.0–49.7)
MCH: 31.7 pg (ref 25.1–34.0)
MCHC: 34.3 g/dL (ref 31.5–36.0)
MCV: 92.5 fL (ref 79.5–101.0)
MONO#: 0.4 10*3/uL (ref 0.1–0.9)
MONO%: 8.8 % (ref 0.0–14.0)
NEUT#: 3.5 10*3/uL (ref 1.5–6.5)
NEUT%: 76.2 % (ref 38.4–76.8)
Platelets: 174 10*3/uL (ref 145–400)
RBC: 4.16 10*6/uL (ref 3.70–5.45)
RDW: 13.1 % (ref 11.2–14.5)
WBC: 4.6 10*3/uL (ref 3.9–10.3)
lymph#: 0.6 10*3/uL — ABNORMAL LOW (ref 0.9–3.3)

## 2016-08-12 LAB — HEMOGLOBIN A1C
ESTIMATED AVERAGE GLUCOSE: 97 mg/dL
Hemoglobin A1c: 5 % (ref 4.8–5.6)

## 2016-08-12 NOTE — Progress Notes (Signed)
Research encounter for [name]-AFT study; Met with patient for 30 minutes prior to her appointment with Dr. Lindi Adie to sign consent form for study.  See Consent form encounter for further details. Foye Spurling, BSN, RN Clinical Research Nurse 08/12/2016 9:30 am

## 2016-08-12 NOTE — Assessment & Plan Note (Addendum)
Bilateral mastectomies 10/01/2015 With reconstruction Right mastectomy: IDC grade 2, multifocal largest 2.5 cm, with high-grade DCIS, broadly present at anterior margin and inferior margin 2/5 LN positive, LVI Present,  Left mastectomy: ALH, ER 95%, PR 5-65%, HER-2 negative, Ki-67 10-20%,  Pathologic stage: T2 N1 a stage IIB Mammaprint: High risk CT-CAP and bone scan: No evidence of metastatic disease ----------------------------------------------------------------------------------------------------------------------------------------------------------------- Treatment plan: 1. Adjuvant chemotherapy with dose dense Adriamycin and Cytoxan 4 followed by Abraxane weekly 12 ( patient is intolerant to steroids and hence we cannot use paclitaxel) started 11/05/2015 completed 03/17/2016 3. Followed by adjuvant radiation therapy Completed 06/08/2016 4. Followed by adjuvant antiestrogen therapy with anastrozole  7 years  PREVENT trial: [id number]  --------------------------------------------------------------------------------------------------------------------------------------------------------Anastrozole Toxicities:  [name] clinical trial: patient is interested in the clinical trial [name] and is willing to be randomized to Palbociclib vs placebo  RTC to assess toxicities

## 2016-08-12 NOTE — Progress Notes (Addendum)
[facility] NOTE  Patient Care Team: Fanny Bien, MD as PCP - General (Family Medicine) Rolm Bookbinder, MD as Consulting Physician (General Surgery) Nicholas Lose, MD as Consulting Physician (Hematology and Oncology)  CHIEF COMPLAINTS/PURPOSE OF CONSULTATION:  Patient is getting enrolled in clinical trial [name]  HISTORY OF PRESENTING ILLNESS:  Melanie Barber 51 y.o. female with a history of right breast cancer who is tolerating adjuvant anastrozole therapy for the past 1 month extremely well. She does not have any significant hot flashes or myalgias. All of her radiation toxicities as well as prior chemotherapy related toxicities have returned to baseline. She had a cardiology evaluation which was normal.  I reviewed her records extensively and collaborated the history with the patient.  SUMMARY OF ONCOLOGIC HISTORY:   Breast cancer of upper-outer quadrant of right female breast ([place])   09/02/2015 Mammogram    Right breast UOQ: 2 masses 3 cm apart, U/S they measured 3.4 cm, larger mass at 1.6 cm, in addition, 1 cm mass at 9:00 and 0.9 cm mass at 8:30( could be intramammary LN); right axillary LN 11 mm; microcalcs 10 cm span       09/09/2015 Initial Diagnosis    Right breast biopsy OUQ: DCIS with calcs and necrosis ER 95%, PR 80%; 9:30 position 5cmfn: IDC with DCIS ER 95%, PR 65%, Ki-67 20%,; right biopsy 8:30 position 3cmfn ER 95%, PR 5%, Ki-67 10%: intramammary LN with IDC      09/12/2015 Breast MRI    Multicentric right breast cancer cluster of enhancing masses 4.1 x 2.2 x 2.6 cm, LOQ: 1.1 cm enhancing mass previously biopsied, subcutaneous low right breast 2 cm mass not biopsied, multiple other small enhancing masses, 1 cm right axillary lymph node      10/01/2015 Surgery    Right mastectomy: IDC grade 2, multifocal largest 2.5 cm, with high-grade DCIS, broadly present at  anterior margin and inferior margin 2/5 LN positive, LVI Present,  Left  mastectomy: ALH, ER 95%, PR 5-65%, HER-2 negative, Ki-67 10-20%, T2 N1 a stage IIB      11/05/2015 - 03/17/2016 Chemotherapy    Adjuvant chemotherapy with dose dense Adriamycin and Cytoxan 4 followed by Abraxane weekly 12      04/22/2016 - 06/08/2016 Radiation Therapy    Adjuvant radiation therapy      06/22/2016 Surgery    Hysterectomy with bilateral salpingo-oophorectomy: Negative for malignancy       MEDICAL HISTORY:  Past Medical History:  Diagnosis Date  . Arthritis    right knee (10/01/2015)  . Cancer of right breast ([place])   . PONV (postoperative nausea and vomiting)   . Sciatic leg pain    left leg    SURGICAL HISTORY: Past Surgical History:  Procedure Laterality Date  . BREAST BIOPSY Right   . BREAST CYST ASPIRATION Right   . BREAST RECONSTRUCTION WITH PLACEMENT OF TISSUE EXPANDER AND FLEX HD (ACELLULAR HYDRATED DERMIS) Bilateral 10/01/2015  . BREAST RECONSTRUCTION WITH PLACEMENT OF TISSUE EXPANDER AND FLEX HD (ACELLULAR HYDRATED DERMIS) Bilateral 10/01/2015   Procedure: BREAST RECONSTRUCTION WITH PLACEMENT OF TISSUE EXPANDER AND CORTIVA;  Surgeon: Irene Limbo, MD;  Location: [facility];  Service: Plastics;  Laterality: Bilateral;  . [place]   growth removed  . MASTECTOMY Left 10/01/2015   NIPPLE SPARING  . MASTECTOMY COMPLETE / SIMPLE W/ SENTINEL NODE BIOPSY Right 10/01/2015   NIPPLE SPARING  . NASAL/SINUS ENDOSCOPY Bilateral 2005  . NIPPLE SPARING MASTECTOMY/SENTINAL LYMPH NODE BIOPSY/RECONSTRUCTION/PLACEMENT  OF TISSUE EXPANDER Bilateral 10/01/2015   Procedure: BILATERAL NIPPLE SPARING MASTECTOMY WITH RIGHT SENTINAL LYMPH NODE BIOPSY;  Surgeon: Rolm Bookbinder, MD;  Location: [facility];  Service: General;  Laterality: Bilateral;  . PORT-A-CATH REMOVAL Right 04/01/2016   Procedure: MINOR REMOVAL PORT-A-CATH;  Surgeon: Rolm Bookbinder, MD;  Location: [facility];  Service: General;  Laterality: Right;  . PORTACATH PLACEMENT Right 10/01/2015   . PORTACATH PLACEMENT Right 10/01/2015   Procedure: INSERTION PORT-A-CATH;  Surgeon: Rolm Bookbinder, MD;  Location: [facility];  Service: General;  Laterality: Right;  . ROBOTIC ASSISTED TOTAL HYSTERECTOMY WITH BILATERAL SALPINGO OOPHERECTOMY Bilateral 06/22/2016   Procedure: XI ROBOTIC ASSISTED TOTAL HYSTERECTOMY WITH BILATERAL SALPINGO OOPHORECTOMY;  Surgeon: Everitt Amber, MD;  Location: [facility];  Service: Gynecology;  Laterality: Bilateral;  . TOE SURGERY Left 1990   "bone spur on great toe"  . TONSILLECTOMY  1977  . ULTRASOUND GUIDANCE FOR VASCULAR ACCESS Right 10/01/2015   Procedure: ULTRASOUND GUIDANCE;  Surgeon: Rolm Bookbinder, MD;  Location: [facility];  Service: General;  Laterality: Right;  . WISDOM TOOTH EXTRACTION      SOCIAL HISTORY: Social History   Social History  . Marital status: Divorced    Spouse name: N/A  . Number of children: N/A  . Years of education: N/A   Occupational History  . Not on file.   Social History Main Topics  . Smoking status: Never Smoker  . Smokeless tobacco: Never Used  . Alcohol use Yes     Comment: socially  . Drug use: No  . Sexual activity: Yes    Birth control/ protection: IUD   Other Topics Concern  . Not on file   Social History Narrative  . No narrative on file    FAMILY HISTORY: Family History  Problem Relation Age of Onset  . Depression Mother   . Inflammatory bowel disease Mother   . Alcohol abuse Mother   . Bipolar disorder Mother   . Hypertension Son   . Other Brother        severe intellectual disabilities  . Arthritis Maternal Uncle        hx knee replacement surgeries  . Stroke Maternal Grandmother 86  . Breast cancer Maternal Grandmother        dx before menopause  . Uterine cancer Maternal Grandmother        dx late 39s  . Bipolar disorder Maternal Grandfather   . Stroke Paternal Grandmother 108  . Diabetes Paternal Grandmother        later in life  . Heart attack Paternal Grandfather     ALLERGIES:  is  allergic to keflex [cephalexin] and decadron [dexamethasone].  MEDICATIONS:  Current Outpatient Prescriptions  Medication Sig Dispense Refill  . anastrozole (ARIMIDEX) 1 MG tablet Take 1 tablet (1 mg total) by mouth daily. 90 tablet 3  . Atorvastatin Calcium (INVESTIGATIONAL ATORVASTATIN/PLACEBO) 40 MG tablet [facility] [address] Take 1 tablet by mouth daily. Take 1 tablet daily with or without food. 180 tablet 0  . Biotin (BIOTIN 5000) 5 MG CAPS Take 10 mg by mouth daily.     . cyclobenzaprine (FLEXERIL) 10 MG tablet Take 1 tablet (10 mg total) by mouth 2 (two) times daily as needed for muscle spasms. 20 tablet 0  . Multiple Vitamin (MULTIVITAMIN) tablet Take 1 tablet by mouth daily.    . naproxen sodium (ANAPROX) 220 MG tablet Take 220 mg by mouth daily as needed (pain).    . Probiotic Product (PRO-BIOTIC BLEND  PO) Take 1 tablet by mouth 2 (two) times daily.     Marland Kitchen terbinafine (LAMISIL) 250 MG tablet Take 1 tablet (250 mg total) by mouth daily. 90 tablet 0   No current facility-administered medications for this visit.     REVIEW OF SYSTEMS:   Constitutional: Denies fevers, chills or abnormal night sweats Eyes: Denies blurriness of vision, double vision or watery eyes Ears, nose, mouth, throat, and face: Denies mucositis or sore throat Respiratory: Denies cough, dyspnea or wheezes Cardiovascular: Denies palpitation, chest discomfort or lower extremity swelling Gastrointestinal:  Denies nausea, heartburn or change in bowel habits Skin: Denies abnormal skin rashes Lymphatics: Denies new lymphadenopathy or easy bruising Neurological:Denies numbness, tingling or new weaknesses Behavioral/Psych: Mood is stable, no new changes  Breast: Bilateral breast reconstruction All other systems were reviewed with the patient and are negative.  PHYSICAL EXAMINATION: ECOG PERFORMANCE STATUS: 0 - Asymptomatic  Vitals:   08/12/16 0908  BP: 114/67  Pulse: 81  Resp: 17  Temp: 98 F (36.7 C)  SpO2:  99%   Filed Weights   08/12/16 0908  Weight: 176 lb 4.8 oz (80 kg)    GENERAL:alert, no distress and comfortable SKIN: skin color, texture, turgor are normal, no rashes or significant lesions EYES: normal, conjunctiva are pink and non-injected, sclera clear OROPHARYNX:no exudate, no erythema and lips, buccal mucosa, and tongue normal  NECK: supple, thyroid normal size, non-tender, without nodularity LYMPH:  no palpable lymphadenopathy in the cervical, axillary or inguinal LUNGS: clear to auscultation and percussion with normal breathing effort HEART: regular rate & rhythm and no murmurs and no lower extremity edema ABDOMEN:abdomen soft, non-tender and normal bowel sounds Musculoskeletal:no cyanosis of digits and no clubbing  PSYCH: alert & oriented x 3 with fluent speech NEURO: no focal motor/sensory deficits BREAST: No palpable nodules in breasts. No palpable axillary or supraclavicular lymphadenopathy (exam performed in the presence of a chaperone)   LABORATORY DATA:  I have reviewed the data as listed Lab Results  Component Value Date   WBC 4.6 08/12/2016   HGB 13.2 08/12/2016   HCT 38.5 08/12/2016   MCV 92.5 08/12/2016   PLT 174 08/12/2016   Lab Results  Component Value Date   NA 139 08/12/2016   K 4.4 08/12/2016   CL 102 06/23/2016   CO2 26 08/12/2016    RADIOGRAPHIC STUDIES: I have personally reviewed the radiological reports and agreed with the findings in the report.  ASSESSMENT AND PLAN:  Breast cancer of upper-outer quadrant of right female breast ([place]) Bilateral mastectomies 10/01/2015 With reconstruction Right mastectomy: IDC grade 2, multifocal largest 2.5 cm, with high-grade DCIS, broadly present at anterior margin and inferior margin 2/5 LN positive, LVI Present,  Left mastectomy: ALH, ER 95%, PR 5-65%, HER-2 negative, Ki-67 10-20%,  Pathologic stage: T2 N1 a stage IIB Mammaprint: High risk CT-CAP and bone scan: No evidence of metastatic  disease ----------------------------------------------------------------------------------------------------------------------------------------------------------------- Treatment plan: 1. Adjuvant chemotherapy with dose dense Adriamycin and Cytoxan 4 followed by Abraxane weekly 12 ( patient is intolerant to steroids and hence we cannot use paclitaxel) started 11/05/2015 completed 03/17/2016 3. Followed by adjuvant radiation therapy Completed 06/08/2016 4. Followed by adjuvant antiestrogen therapy with anastrozole  7 years  PREVENT trial: [id number]  -------------------------------------------------------------------------------------------------------------------------------------------------------- Anastrozole Toxicities: No hot flashes or myalgias. Patient is tolerating anastrozole extremely well.  [name] clinical trial: patient is interested in the clinical trial [name] and is willing to be randomized to Palbociclib vs placebo  RTC to assess toxicities All questions were answered.  The patient knows to call the clinic with any problems, questions or concerns.    Rulon Eisenmenger, MD 08/12/16   ADDENDUM: On Exam, patients infra-clavicular area was also palpated and no abnormalities were found.

## 2016-08-13 ENCOUNTER — Ambulatory Visit: Payer: 59 | Admitting: Hematology and Oncology

## 2016-08-16 DIAGNOSIS — M545 Low back pain: Secondary | ICD-10-CM | POA: Diagnosis not present

## 2016-08-16 DIAGNOSIS — M6281 Muscle weakness (generalized): Secondary | ICD-10-CM | POA: Diagnosis not present

## 2016-08-16 DIAGNOSIS — M5417 Radiculopathy, lumbosacral region: Secondary | ICD-10-CM | POA: Diagnosis not present

## 2016-08-16 DIAGNOSIS — M2560 Stiffness of unspecified joint, not elsewhere classified: Secondary | ICD-10-CM | POA: Diagnosis not present

## 2016-08-17 ENCOUNTER — Other Ambulatory Visit: Payer: Self-pay | Admitting: *Deleted

## 2016-08-17 DIAGNOSIS — C50411 Malignant neoplasm of upper-outer quadrant of right female breast: Secondary | ICD-10-CM

## 2016-08-17 DIAGNOSIS — Z170 Estrogen receptor positive status [ER+]: Principal | ICD-10-CM

## 2016-08-19 ENCOUNTER — Other Ambulatory Visit: Payer: Self-pay | Admitting: Oncology

## 2016-08-19 ENCOUNTER — Other Ambulatory Visit: Payer: 59

## 2016-08-19 ENCOUNTER — Encounter: Payer: 59 | Admitting: *Deleted

## 2016-08-19 ENCOUNTER — Encounter: Payer: Self-pay | Admitting: *Deleted

## 2016-08-19 DIAGNOSIS — C50411 Malignant neoplasm of upper-outer quadrant of right female breast: Secondary | ICD-10-CM

## 2016-08-19 DIAGNOSIS — Z170 Estrogen receptor positive status [ER+]: Principal | ICD-10-CM

## 2016-08-19 LAB — RESEARCH LABS

## 2016-08-19 MED ORDER — INV-PALBOCICLIB 125 MG CAPS #23 ALLIANCE FOUNDATION AFT-05 (PALLAS)
125.0000 mg | ORAL_CAPSULE | Freq: Every day | ORAL | 0 refills | Status: DC
Start: 2016-08-19 — End: 2016-10-08

## 2016-08-19 NOTE — Progress Notes (Signed)
08/19/16 at 11:59am - [name]/AFT-05- cycle 1, day 1 study notes- The pt was into the cancer center this morning for her cycle 1, day 1 assessments.  Since the pt's screening assessments/labs were done within 7 days of cycle 1, day 1 then they served as the C1, Day 1 assessments and did not have to be repeated.  The pt's screening assessments/labs were done on 08/12/16, and she was randomized on 08/17/16.The pt was given her Visit 1 quesionnaires to complete upon arrival to the cancer center.  The pt's research labs were drawn today.  The research nurse reviewed the pt's questionnaires for completeness and accuracy.  The research nurse completed the IRT transaction and received the study drug assignment number (unit [id number]).  The pharmacist, Kennith Center, dispensed the study drug bottle.  The research nurse gave the pt her study drug bottle (21 capsules) along with her drug diaries for cycle 1.  The research nurse informed the pt on how to complete the IP and non-IP drug diaries.  The pt verbalized understanding.  The pt was told about the required study visit for cycle 1, day 14 and cycle 2, day 1.  The pt stated that she would take her study drug (palbociclib) every evening with food for the next 21 days.  The pt understands that she will continue taking her Arimidex daily as prescribed.  All of the pt's questions were answered.  The pt was told to contact Dr. Geralyn Flash for any adverse events related to the study drug.  The research nurse reviewed the pt's current medication list with pt.  The pt was told to inform the research staff before she begins any new medications so that potential drug interactions can be avoided. The pt was again reminded to avoid grapefruit products during study participation.   The pt denied any new AE's.   Brion Aliment RN, BSN, CCRP Clinical Research Nurse 08/19/2016 12:09 PM

## 2016-09-01 ENCOUNTER — Other Ambulatory Visit (HOSPITAL_BASED_OUTPATIENT_CLINIC_OR_DEPARTMENT_OTHER): Payer: 59

## 2016-09-01 ENCOUNTER — Encounter: Payer: Self-pay | Admitting: *Deleted

## 2016-09-01 DIAGNOSIS — C50411 Malignant neoplasm of upper-outer quadrant of right female breast: Secondary | ICD-10-CM | POA: Diagnosis not present

## 2016-09-01 DIAGNOSIS — Z170 Estrogen receptor positive status [ER+]: Principal | ICD-10-CM

## 2016-09-01 DIAGNOSIS — Z006 Encounter for examination for normal comparison and control in clinical research program: Secondary | ICD-10-CM | POA: Diagnosis not present

## 2016-09-01 LAB — CBC WITH DIFFERENTIAL/PLATELET
BASO%: 0.5 % (ref 0.0–2.0)
BASOS ABS: 0 10*3/uL (ref 0.0–0.1)
EOS ABS: 0 10*3/uL (ref 0.0–0.5)
EOS%: 2 % (ref 0.0–7.0)
HEMATOCRIT: 34.6 % — AB (ref 34.8–46.6)
HEMOGLOBIN: 11.7 g/dL (ref 11.6–15.9)
LYMPH#: 0.4 10*3/uL — AB (ref 0.9–3.3)
LYMPH%: 20.9 % (ref 14.0–49.7)
MCH: 32 pg (ref 25.1–34.0)
MCHC: 33.8 g/dL (ref 31.5–36.0)
MCV: 94.5 fL (ref 79.5–101.0)
MONO#: 0.1 10*3/uL (ref 0.1–0.9)
MONO%: 4.6 % (ref 0.0–14.0)
NEUT#: 1.4 10*3/uL — ABNORMAL LOW (ref 1.5–6.5)
NEUT%: 72 % (ref 38.4–76.8)
PLATELETS: 130 10*3/uL — AB (ref 145–400)
RBC: 3.66 10*6/uL — ABNORMAL LOW (ref 3.70–5.45)
RDW: 12.8 % (ref 11.2–14.5)
WBC: 2 10*3/uL — ABNORMAL LOW (ref 3.9–10.3)

## 2016-09-01 LAB — COMPREHENSIVE METABOLIC PANEL
ALBUMIN: 3.9 g/dL (ref 3.5–5.0)
ALK PHOS: 77 U/L (ref 40–150)
ALT: 18 U/L (ref 0–55)
ANION GAP: 6 meq/L (ref 3–11)
AST: 21 U/L (ref 5–34)
BUN: 18.3 mg/dL (ref 7.0–26.0)
CALCIUM: 9 mg/dL (ref 8.4–10.4)
CHLORIDE: 104 meq/L (ref 98–109)
CO2: 30 mEq/L — ABNORMAL HIGH (ref 22–29)
Creatinine: 0.8 mg/dL (ref 0.6–1.1)
Glucose: 85 mg/dl (ref 70–140)
POTASSIUM: 3.8 meq/L (ref 3.5–5.1)
Sodium: 140 mEq/L (ref 136–145)
Total Bilirubin: 0.68 mg/dL (ref 0.20–1.20)
Total Protein: 6.8 g/dL (ref 6.4–8.3)

## 2016-09-01 NOTE — Progress Notes (Signed)
09/01/16 at 11:09am - [name] - cycle 1, day 14/visit 2- The pt was into the cancer center this morning for her cbc/diff and her AE assessment.  The pt confirmed that she has taken her study drug as prescribed.  The pt states she has been doing all of her normal activities.  The pt had an active weekend and is working today.  The pt denies any new adverse events.  A cmet was also obtained to monitor the pt's hepatic function since she is continuing to use Lamisil for her diagnosed toe fungus.  The pt's labs were reviewed with Dr. Lindi Adie.  He told the research nurse to inform the pt to continue taking her study drug through day 21.  The pt verbalized understanding.  The pt's next on-study visit is scheduled for 09/16/16.  The pt was very relieved to hear that her labs were all in range to continue her study drug.  Dr. Lindi Adie stated that her abnormal lab values were all "not clinically significant".  Dr. Lindi Adie did not order any new interventions or medications for the pt. The pt was thanked for her support and participation on the trial.   Brion Aliment RN, BSN, CCRP Clinical Research Nurse 09/01/2016 11:20 AM    [name] [phone]  09/01/2016  Adverse Event Log  Study/Protocol: [name]/AFT-005 Cycle: 1, day 14 Lab and AE assessment  CS= Clinically Significant NCS= Not Clinically Significant  Event Grade Onset Date Resolved Date Attribution to anastrozole Attribution to palbociclib Treatment  Comments  White blood cell decreased 2 09/01/16         No           Yes palbociclib + endocrine therapy AE did not impact study drug treatment? NCS  Neutrophil count decreased 2 09/01/16         No Yes  palbociclib + endocrine therapy AE did not impact study drug treatment/NCS   Platelet count decreased 1 09/01/16          No  Yes palbocicllib + endocrine therapy AE did not impact study drug treatment/NCS

## 2016-09-07 ENCOUNTER — Encounter: Payer: Self-pay | Admitting: Sports Medicine

## 2016-09-07 ENCOUNTER — Ambulatory Visit: Payer: 59

## 2016-09-07 ENCOUNTER — Ambulatory Visit (INDEPENDENT_AMBULATORY_CARE_PROVIDER_SITE_OTHER): Payer: 59 | Admitting: Sports Medicine

## 2016-09-07 DIAGNOSIS — M79674 Pain in right toe(s): Secondary | ICD-10-CM

## 2016-09-07 DIAGNOSIS — B351 Tinea unguium: Secondary | ICD-10-CM | POA: Diagnosis not present

## 2016-09-07 DIAGNOSIS — M79675 Pain in left toe(s): Secondary | ICD-10-CM

## 2016-09-07 NOTE — Progress Notes (Signed)
Subjective: Melanie Barber is a 51 y.o. female patient seen today in office for follow up evaluation of nail fungus on Lamisil. Patient states that she is doing well with no adverse reaction. No noticiable changes to nails at this point. Patient is on stage 4 chemo medication and reports that she is doing ok. Patient has no other pedal complaints at this time.   Patient Active Problem List   Diagnosis Date Noted  . History of bilateral breast cancer 06/22/2016  . Genetic testing 11/17/2015  . Malignant neoplasm of upper-outer quadrant of right breast in female, estrogen receptor positive ([place]) 10/09/2015  . Breast cancer of upper-outer quadrant of right female breast ([place]) 09/12/2015    Current Outpatient Prescriptions on File Prior to Visit  Medication Sig Dispense Refill  . anastrozole (ARIMIDEX) 1 MG tablet Take 1 tablet (1 mg total) by mouth daily. 90 tablet 3  . Atorvastatin Calcium (INVESTIGATIONAL ATORVASTATIN/PLACEBO) 40 MG tablet [facility] [address] Take 1 tablet by mouth daily. Take 1 tablet daily with or without food. 180 tablet 0  . Biotin (BIOTIN 5000) 5 MG CAPS Take 10 mg by mouth daily.     . cyclobenzaprine (FLEXERIL) 10 MG tablet Take 1 tablet (10 mg total) by mouth 2 (two) times daily as needed for muscle spasms. 20 tablet 0  . Investigational palbociclib (IBRANCE) 125 MG capsule [facility] AFT-05 [name] Take 1 capsule (125 mg total) by mouth daily. Take with food. Swallow whole. Do not chew. Take on days 1-21. Repeat every 28 days. 23 capsule 0  . Multiple Vitamin (MULTIVITAMIN) tablet Take 1 tablet by mouth daily.    . naproxen sodium (ANAPROX) 220 MG tablet Take 220 mg by mouth daily as needed (pain).    . Probiotic Product (PRO-BIOTIC BLEND PO) Take 1 tablet by mouth 2 (two) times daily.     Marland Kitchen terbinafine (LAMISIL) 250 MG tablet Take 1 tablet (250 mg total) by mouth daily. 90 tablet 0   No current facility-administered medications on file prior to visit.      Allergies  Allergen Reactions  . Keflex [Cephalexin] Anaphylaxis  . Decadron [Dexamethasone] Anxiety    Beyond hyper    Objective: Physical Exam  General: Well developed, nourished, no acute distress, awake, alert and oriented x 3  Vascular: Dorsalis pedis artery 2/4 bilateral, Posterior tibial artery 2/4 bilateral, skin temperature warm to warm proximal to distal bilateral lower extremities, no varicosities, pedal hair present bilateral.  Neurological: Gross sensation present via light touch bilateral.   Dermatological: Skin is warm, dry, and supple bilateral, Nails 1-10 are tender, short thick, and discolored with mild subungal debris, no webspace macerations present bilateral, no open lesions present bilateral, no callus/corns/hyperkeratotic tissue present bilateral. No signs of infection bilateral.  Musculoskeletal: No symptomatic boney deformities noted bilateral. Muscular strength within normal limits without pain on range of motion. No pain with calf compression bilateral.  Assessment and Plan:  Problem List Items Addressed This Visit    None    Visit Diagnoses    Dermatophytosis of nail    -  Primary   Toe pain, bilateral         -Examined patient -Cont with Lamisil; LFTs were reviewed and are normal -Advised good hygiene habits and educated patient on proper foot care to prevent re-infection -Patient to return in 6 weeks final check after finishing Lamisil or sooner if symptoms worsen.  Landis Martins, DPM

## 2016-09-07 NOTE — Progress Notes (Signed)
Nutrition Assessment   Reason for Assessment:   Patient with questions regarding nutrition and overall health  ASSESSMENT:  51 year old female with right breast cancer.  Patient s/p bilateral mastectomies, adjuvant chemotherapy, radiation therapy and hysterectomy with bilateral salpingo-oophorectomy.  Patient currently taking anastrozole.  Patient also participating in research trial with taking study drug.    Patient reports biggest issue is with fatigue.  Reports overall appetite is good, really trying to eat healthy and wanting more information about that today.  Reports typically eats yogurt and fruit for breakfast, lunch is salad with grilled chicken, dinner is meat and veggies.  Reports she tends to eat less red meat and avoids processed meats.    Nutrition Focused Physical Exam: deferred  Patient reports that she exercised (walked, lifted weights during treatment)  Medications: MVI, probiotic  Labs: reviewed  Anthropometrics:   Height: 67 inches Weight: 176 lb 4.8 oz UBW: 195 lb before cancer diagnosis BMI: 27  9% weight loss in the last year, not significant   Estimated Energy Needs  Kcals: 2000- calories/d Protein: 100 g/d Fluid: 2L/d  NUTRITION DIAGNOSIS: Food and nutrition knowledge deficit related to breast cancer diagnosis as evidenced by no prior need for nutrition information   MALNUTRITION DIAGNOSIS: none at this time   INTERVENTION:   Discussed importance of good nutrition and plant-based diet.  Encouraged good sources of protein, whole grains, colorful fruits and vegetables.   Answered questions and provided packet of information.  Teach back used.   Contact information provided and encouraged patient to reach out to me with questions.    MONITORING, EVALUATION, GOAL: Patient will consume adequate nutrition to maintain weight   NEXT VISIT: as needed  Malacai Grantz B. Zenia Resides, [place], [place] Registered Dietitian [phone] (pager)

## 2016-09-16 ENCOUNTER — Other Ambulatory Visit (HOSPITAL_BASED_OUTPATIENT_CLINIC_OR_DEPARTMENT_OTHER): Payer: 59

## 2016-09-16 ENCOUNTER — Ambulatory Visit (HOSPITAL_BASED_OUTPATIENT_CLINIC_OR_DEPARTMENT_OTHER): Payer: 59 | Admitting: Hematology and Oncology

## 2016-09-16 ENCOUNTER — Encounter: Payer: Self-pay | Admitting: Hematology and Oncology

## 2016-09-16 ENCOUNTER — Encounter: Payer: 59 | Admitting: Adult Health

## 2016-09-16 ENCOUNTER — Encounter: Payer: Self-pay | Admitting: *Deleted

## 2016-09-16 DIAGNOSIS — D701 Agranulocytosis secondary to cancer chemotherapy: Secondary | ICD-10-CM | POA: Diagnosis not present

## 2016-09-16 DIAGNOSIS — C50411 Malignant neoplasm of upper-outer quadrant of right female breast: Secondary | ICD-10-CM | POA: Diagnosis not present

## 2016-09-16 DIAGNOSIS — Z9013 Acquired absence of bilateral breasts and nipples: Secondary | ICD-10-CM | POA: Diagnosis not present

## 2016-09-16 DIAGNOSIS — Z006 Encounter for examination for normal comparison and control in clinical research program: Secondary | ICD-10-CM

## 2016-09-16 DIAGNOSIS — Z170 Estrogen receptor positive status [ER+]: Principal | ICD-10-CM

## 2016-09-16 DIAGNOSIS — Z923 Personal history of irradiation: Secondary | ICD-10-CM | POA: Diagnosis not present

## 2016-09-16 DIAGNOSIS — Z79811 Long term (current) use of aromatase inhibitors: Secondary | ICD-10-CM | POA: Diagnosis not present

## 2016-09-16 DIAGNOSIS — R5383 Other fatigue: Secondary | ICD-10-CM | POA: Diagnosis not present

## 2016-09-16 DIAGNOSIS — Z9221 Personal history of antineoplastic chemotherapy: Secondary | ICD-10-CM

## 2016-09-16 LAB — COMPREHENSIVE METABOLIC PANEL
ALT: 18 U/L (ref 0–55)
ANION GAP: 8 meq/L (ref 3–11)
AST: 20 U/L (ref 5–34)
Albumin: 4.1 g/dL (ref 3.5–5.0)
Alkaline Phosphatase: 86 U/L (ref 40–150)
BILIRUBIN TOTAL: 0.48 mg/dL (ref 0.20–1.20)
BUN: 16.1 mg/dL (ref 7.0–26.0)
CHLORIDE: 103 meq/L (ref 98–109)
CO2: 28 meq/L (ref 22–29)
Calcium: 9.3 mg/dL (ref 8.4–10.4)
Creatinine: 0.8 mg/dL (ref 0.6–1.1)
EGFR: >90 mL/min/{1.73_m2} (ref >90)
GLUCOSE: 100 mg/dL (ref 70–140)
Potassium: 3.8 mEq/L (ref 3.5–5.1)
Sodium: 140 mEq/L (ref 136–145)
TOTAL PROTEIN: 6.9 g/dL (ref 6.4–8.3)

## 2016-09-16 LAB — CBC WITH DIFFERENTIAL/PLATELET
BASO%: 1.3 % (ref 0.0–2.0)
Basophils Absolute: 0 10*3/uL (ref 0.0–0.1)
EOS%: 0.8 % (ref 0.0–7.0)
Eosinophils Absolute: 0 10*3/uL (ref 0.0–0.5)
HCT: 34.5 % — ABNORMAL LOW (ref 34.8–46.6)
HGB: 11.8 g/dL (ref 11.6–15.9)
LYMPH#: 0.6 10*3/uL — AB (ref 0.9–3.3)
LYMPH%: 37.1 % (ref 14.0–49.7)
MCH: 33 pg (ref 25.1–34.0)
MCHC: 34.1 g/dL (ref 31.5–36.0)
MCV: 96.6 fL (ref 79.5–101.0)
MONO#: 0.2 10*3/uL (ref 0.1–0.9)
MONO%: 15.5 % — ABNORMAL HIGH (ref 0.0–14.0)
NEUT%: 45.3 % (ref 38.4–76.8)
NEUTROS ABS: 0.7 10*3/uL — AB (ref 1.5–6.5)
PLATELETS: 152 10*3/uL (ref 145–400)
RBC: 3.56 10*6/uL — AB (ref 3.70–5.45)
RDW: 15 % — ABNORMAL HIGH (ref 11.2–14.5)
WBC: 1.6 10*3/uL — AB (ref 3.9–10.3)

## 2016-09-16 NOTE — Progress Notes (Signed)
Patient Care Team: Fanny Bien, MD as PCP - General (Family Medicine) Rolm Bookbinder, MD as Consulting Physician (General Surgery) Nicholas Lose, MD as Consulting Physician (Hematology and Oncology)  DIAGNOSIS:  Encounter Diagnosis  Name Primary?  . Malignant neoplasm of upper-outer quadrant of right breast in female, estrogen receptor positive ([place])     SUMMARY OF ONCOLOGIC HISTORY:   Breast cancer of upper-outer quadrant of right female breast ([place])   09/02/2015 Mammogram    Right breast UOQ: 2 masses 3 cm apart, U/S they measured 3.4 cm, larger mass at 1.6 cm, in addition, 1 cm mass at 9:00 and 0.9 cm mass at 8:30( could be intramammary LN); right axillary LN 11 mm; microcalcs 10 cm span       09/09/2015 Initial Diagnosis    Right breast biopsy OUQ: DCIS with calcs and necrosis ER 95%, PR 80%; 9:30 position 5cmfn: IDC with DCIS ER 95%, PR 65%, Ki-67 20%,; right biopsy 8:30 position 3cmfn ER 95%, PR 5%, Ki-67 10%: intramammary LN with IDC      09/12/2015 Breast MRI    Multicentric right breast cancer cluster of enhancing masses 4.1 x 2.2 x 2.6 cm, LOQ: 1.1 cm enhancing mass previously biopsied, subcutaneous low right breast 2 cm mass not biopsied, multiple other small enhancing masses, 1 cm right axillary lymph node      10/01/2015 Surgery    Right mastectomy: IDC grade 2, multifocal largest 2.5 cm, with high-grade DCIS, broadly present at  anterior margin and inferior margin 2/5 LN positive, LVI Present,  Left mastectomy: ALH, ER 95%, PR 5-65%, HER-2 negative, Ki-67 10-20%, T2 N1 a stage IIB      11/05/2015 - 03/17/2016 Chemotherapy    Adjuvant chemotherapy with dose dense Adriamycin and Cytoxan 4 followed by Abraxane weekly 12      04/22/2016 - 06/08/2016 Radiation Therapy    Adjuvant radiation therapy      06/22/2016 Surgery    Hysterectomy with bilateral salpingo-oophorectomy: Negative for malignancy      06/30/2016 -  Anti-estrogen oral therapy    Anastrozole 1  mg daily, patient is on a clinical trial [name] ( randomized to Palbociclib)       CHIEF COMPLIANT: Follow-up on Palbociclib  INTERVAL HISTORY: Melanie Barber is a 51 year old with above-mentioned history of bilateral mastectomies for right breast cancer who is currently on antiestrogen therapy with anastrozole. She was randomized to Palbociclib on clinical trial [name] and is here for toxicity check and evaluation. She has tolerated the pill extremely well. She did not have any nausea vomiting. Initially she may have felt mildly nauseated but it did not last. She does have fatigue which is mild and she is able to function normally. She has been working multiple shifts as a Marine scientist. She does feel tired at the end of the day.  REVIEW OF SYSTEMS:   Constitutional: Denies fevers, chills or abnormal weight loss Eyes: Denies blurriness of vision Ears, nose, mouth, throat, and face: Denies mucositis or sore throat Respiratory: Denies cough, dyspnea or wheezes Cardiovascular: Denies palpitation, chest discomfort Gastrointestinal:  Denies nausea, heartburn or change in bowel habits Skin: Denies abnormal skin rashes Lymphatics: Denies new lymphadenopathy or easy bruising Neurological:Denies numbness, tingling or new weaknesses Behavioral/Psych: Mood is stable, no new changes  Extremities: No lower extremity edema All other systems were reviewed with the patient and are negative.  I have reviewed the past medical history, past surgical history, social history and family history with the patient and they  are unchanged from previous note.  ALLERGIES:  is allergic to keflex [cephalexin] and decadron [dexamethasone].  MEDICATIONS:  Current Outpatient Prescriptions  Medication Sig Dispense Refill  . anastrozole (ARIMIDEX) 1 MG tablet Take 1 tablet (1 mg total) by mouth daily. 90 tablet 3  . Atorvastatin Calcium (INVESTIGATIONAL ATORVASTATIN/PLACEBO) 40 MG tablet [facility] 29528 Take 1 tablet by  mouth daily. Take 1 tablet daily with or without food. 180 tablet 0  . Biotin (BIOTIN 5000) 5 MG CAPS Take 10 mg by mouth daily.     . cyclobenzaprine (FLEXERIL) 10 MG tablet Take 1 tablet (10 mg total) by mouth 2 (two) times daily as needed for muscle spasms. 20 tablet 0  . Investigational palbociclib (IBRANCE) 125 MG capsule [facility] AFT-05 [name] Take 1 capsule (125 mg total) by mouth daily. Take with food. Swallow whole. Do not chew. Take on days 1-21. Repeat every 28 days. 23 capsule 0  . Multiple Vitamin (MULTIVITAMIN) tablet Take 1 tablet by mouth daily.    . naproxen sodium (ANAPROX) 220 MG tablet Take 220 mg by mouth daily as needed (pain).    . Probiotic Product (PRO-BIOTIC BLEND PO) Take 1 tablet by mouth 2 (two) times daily.     Marland Kitchen terbinafine (LAMISIL) 250 MG tablet Take 1 tablet (250 mg total) by mouth daily. 90 tablet 0   No current facility-administered medications for this visit.     PHYSICAL EXAMINATION: ECOG PERFORMANCE STATUS: 1 - Symptomatic but completely ambulatory  Vitals:   09/16/16 1554  BP: 97/63  Pulse: 70  Resp: 18  Temp: 97.6 F (36.4 C)  SpO2: 100%   Filed Weights   09/16/16 1554  Weight: 173 lb (78.5 kg)    GENERAL:alert, no distress and comfortable SKIN: skin color, texture, turgor are normal, no rashes or significant lesions EYES: normal, Conjunctiva are pink and non-injected, sclera clear OROPHARYNX:no exudate, no erythema and lips, buccal mucosa, and tongue normal  NECK: supple, thyroid normal size, non-tender, without nodularity LYMPH:  no palpable lymphadenopathy in the cervical, axillary or inguinal LUNGS: clear to auscultation and percussion with normal breathing effort HEART: regular rate & rhythm and no murmurs and no lower extremity edema ABDOMEN:abdomen soft, non-tender and normal bowel sounds MUSCULOSKELETAL:no cyanosis of digits and no clubbing  NEURO: alert & oriented x 3 with fluent speech, no focal motor/sensory  deficits EXTREMITIES: No lower extremity edema   LABORATORY DATA:  I have reviewed the data as listed   Chemistry      Component Value Date/Time   NA 140 09/16/2016 1531   K 3.8 09/16/2016 1531   CL 102 06/23/2016 0533   CO2 28 09/16/2016 1531   BUN 16.1 09/16/2016 1531   CREATININE 0.8 09/16/2016 1531      Component Value Date/Time   CALCIUM 9.3 09/16/2016 1531   ALKPHOS 86 09/16/2016 1531   AST 20 09/16/2016 1531   ALT 18 09/16/2016 1531   BILITOT 0.48 09/16/2016 1531       Lab Results  Component Value Date   WBC 1.6 (L) 09/16/2016   HGB 11.8 09/16/2016   HCT 34.5 (L) 09/16/2016   MCV 96.6 09/16/2016   PLT 152 09/16/2016   NEUTROABS 0.7 (L) 09/16/2016    ASSESSMENT & PLAN:  Breast cancer of upper-outer quadrant of right female breast ([place]) Bilateral mastectomies 10/01/2015 With reconstruction Right mastectomy: IDC grade 2, multifocal largest 2.5 cm, with high-grade DCIS, broadly present at anterior margin and inferior margin 2/5 LN positive, LVI Present,  Left mastectomy: ALH, ER 95%, PR 5-65%, HER-2 negative, Ki-67 10-20%,  Pathologic stage: T2 N1 a stage IIB Mammaprint: High risk CT-CAP and bone scan: No evidence of metastatic disease ----------------------------------------------------------------------------------------------------------------------------------------------------------------- Treatment summary: 1. Adjuvant chemotherapy with dose dense Adriamycin and Cytoxan 4 followed by Abraxane weekly 12 started 11/05/2015 completed 03/17/2016 3. Followed by adjuvant radiation therapy Completed 06/08/2016 4. Followed by adjuvant antiestrogen therapy with anastrozole 7years  PREVENT trial: [id number]  -------------------------------------------------------------------------------------------------------------------------------------------------------- Anastrozole Toxicities: No hot flashes or myalgias. Patient is tolerating anastrozole extremely  well.  [name] clinical trial: Patient has been randomized to Palbociclib. Adverse effects: 1. Grade 1 fatigue 2. neutropenia: We will hold Palbociclib for 1 week and then resume the treatment if ANC increases to more than thousand.  Return to Clinic in one week for toxicity check and follow-up    I spent 25 minutes talking to the patient of which more than half was spent in counseling and coordination of care.  No orders of the defined types were placed in this encounter.  The patient has a good understanding of the overall plan. she agrees with it. she will call with any problems that may develop before the next visit here.   Rulon Eisenmenger, MD 09/16/16

## 2016-09-16 NOTE — Progress Notes (Signed)
09/16/16 at 4:44pm - [name]/AFT-05 - Cycle 2, day 1 delayed- The pt was into the cancer center this afternoon for her cycle 2 assessments.  The pt was given her questionnaires to complete upon arrival to the cancer center.  The pt's labs were drawn, and the pt's ANC value is 0.7.  The pt was informed that her cycle 2 will be delayed until her [place] improves to 1,000 or greater.  The pt was seen by Dr. Lindi Adie.  The pt said that she has developed some mild fatigue (grade 1).  The pt is still working and doing all of her normal activities.  The pt also reported some mild nausea that began after starting palbociclib on 08/20/16, but it quickly resolved 2 days later on 08/22/16.  The pt denies any changes in her medications.  The pt will be seen in 1 week to see if her counts improve.  The pt verbalized understanding.   Brion Aliment RN, BSN, [place]  Clinical Research Nurse 09/16/2016 4:50 PM   FANNYE MYER [id number]  09/17/2016  Adverse Event Log  Study/Protocol: [name]/AFT-05 Cycle: 1 AE's through 09/16/16  Event Grade Onset  Date Resolved Date Attribution to Palbociclib Attribution to anastrozole Treatment Comments  White blood cell decreased 3 09/16/16  Yes  No Palbociclib + Endocrine therapy Grade 2 ended on 09/16/16.   Neutrophil count decreased 3 09/16/16  Yes  No Palbociclib + Endocrine therapy Delayed start of cycle 2 on 09/16/16/Grade 2 ended on 09/16/16  Fatigue 1 09/16/16  Yes  No Palbociclib + Endocrine therapy   Platelet count decreased  1 09/01/16 09/16/16  Yes No Palbociclib + Endocrine therapy   Nausea 1 08/20/16 08/22/16 Yes  No Palbociclib + Endocrine therapy

## 2016-09-16 NOTE — Assessment & Plan Note (Signed)
Bilateral mastectomies 10/01/2015 With reconstruction Right mastectomy: IDC grade 2, multifocal largest 2.5 cm, with high-grade DCIS, broadly present at anterior margin and inferior margin 2/5 LN positive, LVI Present,  Left mastectomy: ALH, ER 95%, PR 5-65%, HER-2 negative, Ki-67 10-20%,  Pathologic stage: T2 N1 a stage IIB Mammaprint: High risk CT-CAP and bone scan: No evidence of metastatic disease ----------------------------------------------------------------------------------------------------------------------------------------------------------------- Treatment summary: 1. Adjuvant chemotherapy with dose dense Adriamycin and Cytoxan 4 followed by Abraxane weekly 12 started 11/05/2015 completed 03/17/2016 3. Followed by adjuvant radiation therapy Completed 06/08/2016 4. Followed by adjuvant antiestrogen therapy with anastrozole 7years  PREVENT trial: [id number]  -------------------------------------------------------------------------------------------------------------------------------------------------------- Anastrozole Toxicities: No hot flashes or myalgias. Patient is tolerating anastrozole extremely well.  [name] clinical trial: Patient has been randomized to Palbociclib. Adverse effects: 1. Grade 1 fatigue 2. neutropenia: We will hold Palbociclib for 1 week and then resume the treatment if ANC increases to more than thousand.  Return to Clinic in one week for toxicity check and follow-up

## 2016-09-23 NOTE — Assessment & Plan Note (Signed)
Bilateral mastectomies 10/01/2015 With reconstruction Right mastectomy: IDC grade 2, multifocal largest 2.5 cm, with high-grade DCIS, broadly present at anterior margin and inferior margin 2/5 LN positive, LVI Present,  Left mastectomy: ALH, ER 95%, PR 5-65%, HER-2 negative, Ki-67 10-20%,  Pathologic stage: T2 N1 a stage IIB Mammaprint: High risk CT-CAP and bone scan: No evidence of metastatic disease ----------------------------------------------------------------------------------------------------------------------------------------------------------------- Treatment summary: 1. Adjuvant chemotherapy with dose dense Adriamycin and Cytoxan 4 followed by Abraxane weekly 12 started 11/05/2015 completed 03/17/2016 3. Followed by adjuvant radiation therapy Completed 06/08/2016 4. Followed by adjuvant antiestrogen therapy with anastrozole 7years  PREVENT trial: [id number]  -------------------------------------------------------------------------------------------------------------------------------------------------------- Anastrozole Toxicities: No hot flashes or myalgias. Patient is tolerating anastrozole extremely well.  [name] clinical trial: Patient has been randomized to Palbociclib. Adverse effects: 1. Grade 1 fatigue 2. neutropenia:   RTC in 2 weeks for cycle 2

## 2016-09-24 ENCOUNTER — Telehealth: Payer: Self-pay

## 2016-09-24 ENCOUNTER — Telehealth: Payer: Self-pay | Admitting: Hematology and Oncology

## 2016-09-24 ENCOUNTER — Ambulatory Visit (HOSPITAL_BASED_OUTPATIENT_CLINIC_OR_DEPARTMENT_OTHER): Payer: 59 | Admitting: Hematology and Oncology

## 2016-09-24 ENCOUNTER — Telehealth: Payer: Self-pay | Admitting: Gynecologic Oncology

## 2016-09-24 ENCOUNTER — Other Ambulatory Visit (HOSPITAL_BASED_OUTPATIENT_CLINIC_OR_DEPARTMENT_OTHER): Payer: 59

## 2016-09-24 ENCOUNTER — Encounter: Payer: Self-pay | Admitting: *Deleted

## 2016-09-24 DIAGNOSIS — Z923 Personal history of irradiation: Secondary | ICD-10-CM | POA: Diagnosis not present

## 2016-09-24 DIAGNOSIS — Z006 Encounter for examination for normal comparison and control in clinical research program: Secondary | ICD-10-CM

## 2016-09-24 DIAGNOSIS — C50411 Malignant neoplasm of upper-outer quadrant of right female breast: Secondary | ICD-10-CM | POA: Diagnosis not present

## 2016-09-24 DIAGNOSIS — Z9221 Personal history of antineoplastic chemotherapy: Secondary | ICD-10-CM | POA: Diagnosis not present

## 2016-09-24 DIAGNOSIS — Z9013 Acquired absence of bilateral breasts and nipples: Secondary | ICD-10-CM

## 2016-09-24 DIAGNOSIS — Z170 Estrogen receptor positive status [ER+]: Secondary | ICD-10-CM

## 2016-09-24 DIAGNOSIS — Z79811 Long term (current) use of aromatase inhibitors: Secondary | ICD-10-CM

## 2016-09-24 LAB — COMPREHENSIVE METABOLIC PANEL
ALT: 24 U/L (ref 0–55)
ANION GAP: 8 meq/L (ref 3–11)
AST: 29 U/L (ref 5–34)
Albumin: 4.1 g/dL (ref 3.5–5.0)
Alkaline Phosphatase: 81 U/L (ref 40–150)
BUN: 13.2 mg/dL (ref 7.0–26.0)
CALCIUM: 9.2 mg/dL (ref 8.4–10.4)
CHLORIDE: 107 meq/L (ref 98–109)
CO2: 28 mEq/L (ref 22–29)
Creatinine: 0.8 mg/dL (ref 0.6–1.1)
EGFR: 84 mL/min/{1.73_m2} — ABNORMAL LOW (ref >90)
Glucose: 75 mg/dl (ref 70–140)
POTASSIUM: 4 meq/L (ref 3.5–5.1)
Sodium: 144 mEq/L (ref 136–145)
Total Bilirubin: 0.47 mg/dL (ref 0.20–1.20)
Total Protein: 7 g/dL (ref 6.4–8.3)

## 2016-09-24 LAB — CBC WITH DIFFERENTIAL/PLATELET
BASO%: 1.4 % (ref 0.0–2.0)
Basophils Absolute: 0.1 10*3/uL (ref 0.0–0.1)
EOS ABS: 0 10*3/uL (ref 0.0–0.5)
EOS%: 0.8 % (ref 0.0–7.0)
HCT: 36 % (ref 34.8–46.6)
HGB: 12.6 g/dL (ref 11.6–15.9)
LYMPH%: 14.6 % (ref 14.0–49.7)
MCH: 33.7 pg (ref 25.1–34.0)
MCHC: 35 g/dL (ref 31.5–36.0)
MCV: 96.4 fL (ref 79.5–101.0)
MONO#: 0.4 10*3/uL (ref 0.1–0.9)
MONO%: 11.7 % (ref 0.0–14.0)
NEUT%: 71.5 % (ref 38.4–76.8)
NEUTROS ABS: 2.6 10*3/uL (ref 1.5–6.5)
PLATELETS: 201 10*3/uL (ref 145–400)
RBC: 3.73 10*6/uL (ref 3.70–5.45)
RDW: 17.1 % — AB (ref 11.2–14.5)
WBC: 3.6 10*3/uL — AB (ref 3.9–10.3)
lymph#: 0.5 10*3/uL — ABNORMAL LOW (ref 0.9–3.3)

## 2016-09-24 MED ORDER — INV-PALBOCICLIB 125 MG CAPS #23 ALLIANCE FOUNDATION AFT-05 (PALLAS): 125.0000 mg | ORAL_CAPSULE | Freq: Every day | ORAL | 0 refills | Status: DC

## 2016-09-24 NOTE — Telephone Encounter (Signed)
Spoke with patient and scheduled appt per 9/21 los Did not want avs or calendar

## 2016-09-24 NOTE — Telephone Encounter (Signed)
Patients md visit was cancelled per [facility] and Dr.Gudena.  Per Lexine Baton patient does not need to see Dr. Lindi Adie again and can see Mendel Ryder.  Mendel Ryder is good with the new schedule and Lexine Baton will email the patient.

## 2016-09-24 NOTE — Progress Notes (Signed)
09/24/16 at 10:46am - Cycle 2, day 1 study notes- The pt was into the cancer center today for her cycle 2 assessments.  The pt was given her visit 3 questionnaires to complete upon arrival to the cancer center.  The pt had her labs drawn.  The pt returned her cycle 1 study drug bottle for the drug accountability check.  The bottle was empty.  The pt however forgot to return her anti-hormone drug diary.  The pt said that she would return it at her next office visit.  The research nurse reviewed the pt's completed questionnaires for completeness and accuracy.  The pt was seen and examined by Dr. Lindi Adie.  He reviewed her labs, and he stated that her counts have improved.  He instructed the pt to start her cycle 2 dosing today.  The pt was informed that her current dose will remain at 125 mg.  The pt denies any new medication changes.  The pt reports ongoing grade 1 fatigue.  The pt said that she had some bleeding after intercourse last night (vaginal hemorrhage - grade 1)  The pt was very concerned since she had gynecological surgery over 3 months ago.  Dr. Lindi Adie notified Joylene John, NP about the pt's issues.  The pt said that Melissa was going to follow up with Dr. Denman George.  The research nurse completed the IRT transaction for drug assignment.  Redgie Grayer, pharmacist, dispensed the pt's assigned study bottle to the research nurse.  The pt was given the study drug bottle along with her cycle 2 drug diaries.  The pt was instructed to start her palbociclib dose today.  The pt verbalized understanding.  The pt is aware of her next appointments.   Brion Aliment RN, BSN, [place] Clinical Research Nurse 09/24/2016 11:08 AM  Melanie Barber [id number]  09/24/2016  Adverse Event Log  Study/Protocol: [name]/AFT-05 Cycle: 1 AE's through 09/24/16  Event Grade Onset  Date Resolved Date Attribution to Palbociclib Attribution to anastrozole Treatment Comments  White blood cell decreased 1 09/24/16 Ongoing Yes  No Palbociclib +Endocrine therapy Not clinically significant  White blood cell decreased 3 09/16/16 09/24/16 Yes  No Palbociclib + Endocrine therapy Grade 2 ended on 09/16/16.   Neutrophil count decreased 3 09/16/16 09/24/16 Yes  No Palbociclib + Endocrine therapy Delayed start of cycle 2 on 09/16/16/Grade 2 ended on 09/16/16  Fatigue 1 09/16/16 Ongoing Yes  Yes Palbociclib + Endocrine therapy   Platelet count decreased  1 09/01/16 09/16/16  Yes No Palbociclib + Endocrine therapy Not clinically significant  Vaginal hemorrhage 1 09/24/16 Ongoing  No No Palbociclib +Endocrine therapy No intervention  Nausea 1 08/20/16 08/22/16 Yes  No Palbociclib + Endocrine therapy

## 2016-09-24 NOTE — Progress Notes (Addendum)
Patient Care Team: Fanny Bien, MD as PCP - General (Family Medicine) Rolm Bookbinder, MD as Consulting Physician (General Surgery) Nicholas Lose, MD as Consulting Physician (Hematology and Oncology)  DIAGNOSIS:  Encounter Diagnosis  Name Primary?  . Malignant neoplasm of upper-outer quadrant of right breast in female, estrogen receptor positive ([place])     SUMMARY OF ONCOLOGIC HISTORY:   Breast cancer of upper-outer quadrant of right female breast ([place])   09/02/2015 Mammogram    Right breast UOQ: 2 masses 3 cm apart, U/S they measured 3.4 cm, larger mass at 1.6 cm, in addition, 1 cm mass at 9:00 and 0.9 cm mass at 8:30( could be intramammary LN); right axillary LN 11 mm; microcalcs 10 cm span       09/09/2015 Initial Diagnosis    Right breast biopsy OUQ: DCIS with calcs and necrosis ER 95%, PR 80%; 9:30 position 5cmfn: IDC with DCIS ER 95%, PR 65%, Ki-67 20%,; right biopsy 8:30 position 3cmfn ER 95%, PR 5%, Ki-67 10%: intramammary LN with IDC      09/12/2015 Breast MRI    Multicentric right breast cancer cluster of enhancing masses 4.1 x 2.2 x 2.6 cm, LOQ: 1.1 cm enhancing mass previously biopsied, subcutaneous low right breast 2 cm mass not biopsied, multiple other small enhancing masses, 1 cm right axillary lymph node      10/01/2015 Surgery    Right mastectomy: IDC grade 2, multifocal largest 2.5 cm, with high-grade DCIS, broadly present at  anterior margin and inferior margin 2/5 LN positive, LVI Present,  Left mastectomy: ALH, ER 95%, PR 5-65%, HER-2 negative, Ki-67 10-20%, T2 N1 a stage IIB      11/05/2015 - 03/17/2016 Chemotherapy    Adjuvant chemotherapy with dose dense Adriamycin and Cytoxan 4 followed by Abraxane weekly 12      04/22/2016 - 06/08/2016 Radiation Therapy    Adjuvant radiation therapy      06/22/2016 Surgery    Hysterectomy with bilateral salpingo-oophorectomy: Negative for malignancy      06/30/2016 -  Anti-estrogen oral therapy    Anastrozole 1  mg daily, patient is on a clinical trial [name] ( randomized to Palbociclib)       CHIEF COMPLIANT: Follow-up of lab work for [employer] clinical trial on Ibrance  INTERVAL HISTORY: Melanie Barber is a 51 year old with above-mentioned history of right breast cancer was currently on [name] clinical trial. She was randomized to [place] along with anastrozole. Last week her blood counts were low and hence we had to hold her treatment and she is here today to recheck her labs. Overall she appears to tolerated Ibrance extremely well.  REVIEW OF SYSTEMS:   Constitutional: Denies fevers, chills or abnormal weight loss Eyes: Denies blurriness of vision Ears, nose, mouth, throat, and face: Denies mucositis or sore throat Respiratory: Denies cough, dyspnea or wheezes Cardiovascular: Denies palpitation, chest discomfort Gastrointestinal:  Denies nausea, heartburn or change in bowel habits Skin: Denies abnormal skin rashes Lymphatics: Denies new lymphadenopathy or easy bruising Neurological:Denies numbness, tingling or new weaknesses Behavioral/Psych: Mood is stable, no new changes  Extremities: No lower extremity edema  All other systems were reviewed with the patient and are negative.  I have reviewed the past medical history, past surgical history, social history and family history with the patient and they are unchanged from previous note.  ALLERGIES:  is allergic to keflex [cephalexin] and decadron [dexamethasone].  MEDICATIONS:  Current Outpatient Prescriptions  Medication Sig Dispense Refill  . anastrozole (ARIMIDEX) 1 MG  tablet Take 1 tablet (1 mg total) by mouth daily. 90 tablet 3  . Atorvastatin Calcium (INVESTIGATIONAL ATORVASTATIN/PLACEBO) 40 MG tablet [facility] 66063 Take 1 tablet by mouth daily. Take 1 tablet daily with or without food. 180 tablet 0  . Biotin (BIOTIN 5000) 5 MG CAPS Take 10 mg by mouth daily.     . cyclobenzaprine (FLEXERIL) 10 MG tablet Take 1 tablet (10 mg  total) by mouth 2 (two) times daily as needed for muscle spasms. 20 tablet 0  . Investigational palbociclib (IBRANCE) 125 MG capsule [facility] AFT-05 [name] Take 1 capsule (125 mg total) by mouth daily. Take with food. Swallow whole. Do not chew. Take on days 1-21. Repeat every 28 days. 23 capsule 0  . Investigational palbociclib (IBRANCE) 125 MG capsule [facility] AFT-05 [name] Take 1 capsule (125 mg total) by mouth daily. Take with food. Swallow whole. Do not chew. Take on days 1-21. Repeat every 28 days. 23 capsule 0  . Multiple Vitamin (MULTIVITAMIN) tablet Take 1 tablet by mouth daily.    . naproxen sodium (ANAPROX) 220 MG tablet Take 220 mg by mouth daily as needed (pain).    . Probiotic Product (PRO-BIOTIC BLEND PO) Take 1 tablet by mouth 2 (two) times daily.     Marland Kitchen terbinafine (LAMISIL) 250 MG tablet Take 1 tablet (250 mg total) by mouth daily. 90 tablet 0   No current facility-administered medications for this visit.     PHYSICAL EXAMINATION: ECOG PERFORMANCE STATUS: 1 - Symptomatic but completely ambulatory  Vitals:   09/24/16 0944  BP: 108/67  Pulse: 73  Resp: 20  Temp: 98.3 F (36.8 C)  SpO2: 100%   Filed Weights   09/24/16 0944  Weight: 170 lb 14.4 oz (77.5 kg)    GENERAL:alert, no distress and comfortable SKIN: skin color, texture, turgor are normal, no rashes or significant lesions EYES: normal, Conjunctiva are pink and non-injected, sclera clear OROPHARYNX:no exudate, no erythema and lips, buccal mucosa, and tongue normal  NECK: supple, thyroid normal size, non-tender, without nodularity LYMPH:  no palpable lymphadenopathy in the cervical, axillary or inguinal LUNGS: clear to auscultation and percussion with normal breathing effort HEART: regular rate & rhythm and no murmurs and no lower extremity edema ABDOMEN:abdomen soft, non-tender and normal bowel sounds MUSCULOSKELETAL:no cyanosis of digits and no clubbing  NEURO: alert & oriented x 3 with  fluent speech, no focal motor/sensory deficits EXTREMITIES: No lower extremity edema  LABORATORY DATA:  I have reviewed the data as listed   Chemistry      Component Value Date/Time   NA 144 09/24/2016 0927   K 4.0 09/24/2016 0927   CL 102 06/23/2016 0533   CO2 28 09/24/2016 0927   BUN 13.2 09/24/2016 0927   CREATININE 0.8 09/24/2016 0927      Component Value Date/Time   CALCIUM 9.2 09/24/2016 0927   ALKPHOS 81 09/24/2016 0927   AST 29 09/24/2016 0927   ALT 24 09/24/2016 0927   BILITOT 0.47 09/24/2016 0927       Lab Results  Component Value Date   WBC 3.6 (L) 09/24/2016   HGB 12.6 09/24/2016   HCT 36.0 09/24/2016   MCV 96.4 09/24/2016   PLT 201 09/24/2016   NEUTROABS 2.6 09/24/2016    ASSESSMENT & PLAN:  Breast cancer of upper-outer quadrant of right female breast ([place]) Bilateral mastectomies 10/01/2015 With reconstruction Right mastectomy: IDC grade 2, multifocal largest 2.5 cm, with high-grade DCIS, broadly present at anterior margin and inferior  margin 2/5 LN positive, LVI Present,  Left mastectomy: ALH, ER 95%, PR 5-65%, HER-2 negative, Ki-67 10-20%,  Pathologic stage: T2 N1 a stage IIB Mammaprint: High risk CT-CAP and bone scan: No evidence of metastatic disease ----------------------------------------------------------------------------------------------------------------------------------------------------------------- Treatment summary: 1. Adjuvant chemotherapy with dose dense Adriamycin and Cytoxan 4 followed by Abraxane weekly 12 started 11/05/2015 completed 03/17/2016 3. Followed by adjuvant radiation therapy Completed 06/08/2016 4. Followed by adjuvant antiestrogen therapy with anastrozole 7years  PREVENT trial: [id number]  No toxicities to Study Drug -------------------------------------------------------------------------------------------------------------------------------------------------------- Anastrozole Toxicities: No hot flashes or  myalgias. Patient is tolerating anastrozole extremely well.  [name] clinical trial: Patient has been randomized to Palbociclib. Adverse effects: 1. Grade 1 fatigue 2. neutropenia: Today's ANC is 2.6. She can resume cycle 2  Breast reconstruction issues: Patient was informed that she needs left latissimus dorsi reconstruction. She was not happy about the process. She is getting a second opinion at [facility].  I spent 25 minutes talking to the patient of which more than half was spent in counseling and coordination of care.  Orders Placed This Encounter  Procedures  . Comprehensive metabolic panel    Standing Status:   Future    Standing Expiration Date:   09/24/2017   The patient has a good understanding of the overall plan. she agrees with it. she will call with any problems that may develop before the next visit here.   Rulon Eisenmenger, MD 09/27/16

## 2016-09-24 NOTE — Telephone Encounter (Signed)
Left message for patient about vaginal spotting.  Advised to call if this persists or worsens and an appt will be made to assess the vaginal cuff.

## 2016-09-29 DIAGNOSIS — Z923 Personal history of irradiation: Secondary | ICD-10-CM | POA: Diagnosis not present

## 2016-09-29 DIAGNOSIS — Z9013 Acquired absence of bilateral breasts and nipples: Secondary | ICD-10-CM | POA: Diagnosis not present

## 2016-09-29 DIAGNOSIS — Z170 Estrogen receptor positive status [ER+]: Secondary | ICD-10-CM | POA: Diagnosis not present

## 2016-09-29 DIAGNOSIS — C50411 Malignant neoplasm of upper-outer quadrant of right female breast: Secondary | ICD-10-CM | POA: Diagnosis not present

## 2016-09-30 ENCOUNTER — Encounter: Payer: 59 | Admitting: Adult Health

## 2016-10-01 ENCOUNTER — Encounter: Payer: 59 | Admitting: Adult Health

## 2016-10-06 MED FILL — ANASTROZOLE 1 MG TABLET: 1 | 90 days supply | Qty: 90 | Fill #1

## 2016-10-07 ENCOUNTER — Other Ambulatory Visit: Payer: Self-pay | Admitting: *Deleted

## 2016-10-07 DIAGNOSIS — Z170 Estrogen receptor positive status [ER+]: Principal | ICD-10-CM

## 2016-10-07 DIAGNOSIS — C50411 Malignant neoplasm of upper-outer quadrant of right female breast: Secondary | ICD-10-CM

## 2016-10-08 ENCOUNTER — Other Ambulatory Visit: Payer: 59

## 2016-10-08 ENCOUNTER — Ambulatory Visit: Payer: 59 | Admitting: Hematology and Oncology

## 2016-10-08 ENCOUNTER — Encounter: Payer: Self-pay | Admitting: Adult Health

## 2016-10-08 ENCOUNTER — Other Ambulatory Visit (HOSPITAL_BASED_OUTPATIENT_CLINIC_OR_DEPARTMENT_OTHER): Payer: 59

## 2016-10-08 ENCOUNTER — Telehealth: Payer: Self-pay | Admitting: Adult Health

## 2016-10-08 ENCOUNTER — Ambulatory Visit (HOSPITAL_BASED_OUTPATIENT_CLINIC_OR_DEPARTMENT_OTHER): Payer: 59 | Admitting: Adult Health

## 2016-10-08 ENCOUNTER — Encounter: Payer: Self-pay | Admitting: *Deleted

## 2016-10-08 ENCOUNTER — Encounter: Payer: Self-pay | Admitting: Hematology and Oncology

## 2016-10-08 VITALS — BP 116/48 | HR 74 | Temp 97.7°F | Resp 18 | Ht 67.0 in | Wt 170.8 lb

## 2016-10-08 DIAGNOSIS — Z79811 Long term (current) use of aromatase inhibitors: Secondary | ICD-10-CM | POA: Diagnosis not present

## 2016-10-08 DIAGNOSIS — C50411 Malignant neoplasm of upper-outer quadrant of right female breast: Secondary | ICD-10-CM

## 2016-10-08 DIAGNOSIS — Z006 Encounter for examination for normal comparison and control in clinical research program: Secondary | ICD-10-CM

## 2016-10-08 DIAGNOSIS — Z170 Estrogen receptor positive status [ER+]: Principal | ICD-10-CM

## 2016-10-08 DIAGNOSIS — Z9013 Acquired absence of bilateral breasts and nipples: Secondary | ICD-10-CM | POA: Diagnosis not present

## 2016-10-08 DIAGNOSIS — M2550 Pain in unspecified joint: Secondary | ICD-10-CM

## 2016-10-08 DIAGNOSIS — Z923 Personal history of irradiation: Secondary | ICD-10-CM | POA: Diagnosis not present

## 2016-10-08 DIAGNOSIS — N951 Menopausal and female climacteric states: Secondary | ICD-10-CM | POA: Diagnosis not present

## 2016-10-08 DIAGNOSIS — E2839 Other primary ovarian failure: Secondary | ICD-10-CM

## 2016-10-08 DIAGNOSIS — Z9221 Personal history of antineoplastic chemotherapy: Secondary | ICD-10-CM | POA: Diagnosis not present

## 2016-10-08 LAB — COMPREHENSIVE METABOLIC PANEL
ALT: 16 U/L (ref 0–55)
ANION GAP: 7 meq/L (ref 3–11)
AST: 20 U/L (ref 5–34)
Albumin: 4.1 g/dL (ref 3.5–5.0)
Alkaline Phosphatase: 71 U/L (ref 40–150)
BILIRUBIN TOTAL: 0.49 mg/dL (ref 0.20–1.20)
BUN: 21.2 mg/dL (ref 7.0–26.0)
CHLORIDE: 108 meq/L (ref 98–109)
CO2: 28 meq/L (ref 22–29)
Calcium: 9.4 mg/dL (ref 8.4–10.4)
Creatinine: 0.8 mg/dL (ref 0.6–1.1)
EGFR: 88 mL/min/{1.73_m2} — AB (ref >90)
GLUCOSE: 76 mg/dL (ref 70–140)
Potassium: 4.3 mEq/L (ref 3.5–5.1)
SODIUM: 143 meq/L (ref 136–145)
TOTAL PROTEIN: 7 g/dL (ref 6.4–8.3)

## 2016-10-08 LAB — CBC WITH DIFFERENTIAL/PLATELET
BASO%: 1.4 % (ref 0.0–2.0)
Basophils Absolute: 0 10*3/uL (ref 0.0–0.1)
EOS%: 3.3 % (ref 0.0–7.0)
Eosinophils Absolute: 0.1 10*3/uL (ref 0.0–0.5)
HCT: 35.6 % (ref 34.8–46.6)
HGB: 12.3 g/dL (ref 11.6–15.9)
LYMPH%: 18.1 % (ref 14.0–49.7)
MCH: 33.8 pg (ref 25.1–34.0)
MCHC: 34.7 g/dL (ref 31.5–36.0)
MCV: 97.5 fL (ref 79.5–101.0)
MONO#: 0.1 10*3/uL (ref 0.1–0.9)
MONO%: 5.8 % (ref 0.0–14.0)
NEUT%: 71.4 % (ref 38.4–76.8)
NEUTROS ABS: 1.7 10*3/uL (ref 1.5–6.5)
Platelets: 164 10*3/uL (ref 145–400)
RBC: 3.65 10*6/uL — AB (ref 3.70–5.45)
RDW: 16.2 % — ABNORMAL HIGH (ref 11.2–14.5)
WBC: 2.4 10*3/uL — AB (ref 3.9–10.3)
lymph#: 0.4 10*3/uL — ABNORMAL LOW (ref 0.9–3.3)

## 2016-10-08 NOTE — Progress Notes (Signed)
10/08/16 at 10:34am - [name] - cycle 2, day 15 study notes - The pt was in the cancer center this morning for her cycle 2, day 15 labs and her survivorship visit.  Dr. Lindi Adie allowed the pt to have her cmet drawn in addition to her required cbc/diff because the pt is on Lamisil for her toenail infection.  The pt's labs were reviewed with the pt.  The pt was instructed to continue taking her study drug, palbociclib, through day 21 (10/14/16).  The pt verbalized understanding.  The pt's grade 2 white blood cells decreased was considered "not clinically significant".  No intervention was ordered for her low white blood cells.  The pt still has ongoing fatigue and she said that she had another episode of vaginal bleeding after intercourse recently.  The pt said that she was going to be evaluated by her gynecologist soon.  The pt returned her cycle 1 anti-hormone drug diary. The pt was thanked for her compliance with study procedures.  The pt was given her appointments for her next on-study visit on 10/22/16.  Brion Aliment RN, BSN, CCRP  Clinical Research Nurse 10/08/2016 10:44 AM

## 2016-10-08 NOTE — Progress Notes (Signed)
CLINIC:  Survivorship   REASON FOR VISIT:  Routine follow-up post-treatment for a recent history of breast cancer.  BRIEF ONCOLOGIC HISTORY:    Breast cancer of upper-outer quadrant of right female breast ([place])   09/02/2015 Mammogram    Right breast UOQ: 2 masses 3 cm apart, U/S they measured 3.4 cm, larger mass at 1.6 cm, in addition, 1 cm mass at 9:00 and 0.9 cm mass at 8:30( could be intramammary LN); right axillary LN 11 mm; microcalcs 10 cm span       09/09/2015 Initial Diagnosis    Right breast biopsy OUQ: DCIS with calcs and necrosis ER 95%, PR 80%; 9:30 position 5cmfn: IDC with DCIS ER 95%, PR 65%, Ki-67 20%,; right biopsy 8:30 position 3cmfn ER 95%, PR 5%, Ki-67 10%: intramammary LN with IDC      09/12/2015 Breast MRI    Multicentric right breast cancer cluster of enhancing masses 4.1 x 2.2 x 2.6 cm, LOQ: 1.1 cm enhancing mass previously biopsied, subcutaneous low right breast 2 cm mass not biopsied, multiple other small enhancing masses, 1 cm right axillary lymph node      10/01/2015 Surgery    Right mastectomy: IDC grade 2, multifocal largest 2.5 cm, with high-grade DCIS, broadly present at  anterior margin and inferior margin 2/5 LN positive, LVI Present,  Left mastectomy: ALH, ER 95%, PR 5-65%, HER-2 negative, Ki-67 10-20%, T2 N1 a stage IIB      11/05/2015 - 03/17/2016 Chemotherapy    Adjuvant chemotherapy with dose dense Adriamycin and Cytoxan 4 followed by Abraxane weekly 12      04/22/2016 - 06/08/2016 Radiation Therapy    Adjuvant radiation therapy      06/22/2016 Surgery    Hysterectomy with bilateral salpingo-oophorectomy: Negative for malignancy      06/30/2016 -  Anti-estrogen oral therapy    Anastrozole 1 mg daily, patient is on a clinical trial [name] ( randomized to Palbociclib)       INTERVAL HISTORY:  Melanie Barber presents to the [facility] today for our initial meeting to review her survivorship care plan detailing her treatment course for  breast cancer, as well as monitoring long-term side effects of that treatment, education regarding health maintenance, screening, and overall wellness and health promotion.     Overall, Melanie Barber reports feeling quite well today.  She is taking Anastrozole daily along with Palbociclib per the [name] study.  She continues to have some fatigue.  The hardest period for her has been the transitional period following treatment and getting back to her new normal.  She denies any issues with anxiety and depression.  She has gone back to work, and is slowly increasing in her ability to work for long periods of time several days in a row.  She tolerates the Anastrozole well for the most part.  She is experiencing some hot flashes and arthralgias after sitting for long periods of time.  Otherwise, she is doing well and is without any questions or concerns.     REVIEW OF SYSTEMS:  Review of Systems  Constitutional: Negative for appetite change, chills, fatigue, fever and unexpected weight change.  HENT:   Negative for hearing loss and lump/mass.   Eyes: Negative for eye problems and icterus.  Respiratory: Negative for chest tightness, cough and shortness of breath.   Cardiovascular: Negative for chest pain, leg swelling and palpitations.  Gastrointestinal: Negative for abdominal distention, abdominal pain, constipation, diarrhea, nausea and vomiting.  Endocrine: Positive for hot flashes.  Musculoskeletal: Positive for arthralgias.  Skin: Negative for itching and rash.  Neurological: Negative for dizziness, extremity weakness, headaches and numbness.  Hematological: Negative for adenopathy. Does not bruise/bleed easily.  Psychiatric/Behavioral: Negative for depression. The patient is not nervous/anxious.   Breast: Denies any new nodularity, masses, tenderness, nipple changes, or nipple discharge.      ONCOLOGY TREATMENT TEAM:  1. Surgeon:  Dr. Donne Hazel at [facility] 2. Medical  Oncologist: Dr. Lindi Adie  3. Radiation Oncologist: Dr. Lisbeth Renshaw    PAST MEDICAL/SURGICAL HISTORY:  Past Medical History:  Diagnosis Date  . Arthritis    right knee (10/01/2015)  . Cancer of right breast ([place])   . PONV (postoperative nausea and vomiting)   . Sciatic leg pain    left leg   Past Surgical History:  Procedure Laterality Date  . BREAST BIOPSY Right   . BREAST CYST ASPIRATION Right   . BREAST RECONSTRUCTION WITH PLACEMENT OF TISSUE EXPANDER AND FLEX HD (ACELLULAR HYDRATED DERMIS) Bilateral 10/01/2015  . BREAST RECONSTRUCTION WITH PLACEMENT OF TISSUE EXPANDER AND FLEX HD (ACELLULAR HYDRATED DERMIS) Bilateral 10/01/2015   Procedure: BREAST RECONSTRUCTION WITH PLACEMENT OF TISSUE EXPANDER AND CORTIVA;  Surgeon: Irene Limbo, MD;  Location: [facility];  Service: Plastics;  Laterality: Bilateral;  . [place]   growth removed  . MASTECTOMY Left 10/01/2015   NIPPLE SPARING  . MASTECTOMY COMPLETE / SIMPLE W/ SENTINEL NODE BIOPSY Right 10/01/2015   NIPPLE SPARING  . NASAL/SINUS ENDOSCOPY Bilateral 2005  . NIPPLE SPARING MASTECTOMY/SENTINAL LYMPH NODE BIOPSY/RECONSTRUCTION/PLACEMENT OF TISSUE EXPANDER Bilateral 10/01/2015   Procedure: BILATERAL NIPPLE SPARING MASTECTOMY WITH RIGHT SENTINAL LYMPH NODE BIOPSY;  Surgeon: Rolm Bookbinder, MD;  Location: [facility];  Service: General;  Laterality: Bilateral;  . PORT-A-CATH REMOVAL Right 04/01/2016   Procedure: MINOR REMOVAL PORT-A-CATH;  Surgeon: Rolm Bookbinder, MD;  Location: [facility];  Service: General;  Laterality: Right;  . PORTACATH PLACEMENT Right 10/01/2015  . PORTACATH PLACEMENT Right 10/01/2015   Procedure: INSERTION PORT-A-CATH;  Surgeon: Rolm Bookbinder, MD;  Location: [facility];  Service: General;  Laterality: Right;  . ROBOTIC ASSISTED TOTAL HYSTERECTOMY WITH BILATERAL SALPINGO OOPHERECTOMY Bilateral 06/22/2016   Procedure: XI ROBOTIC ASSISTED TOTAL HYSTERECTOMY WITH BILATERAL SALPINGO OOPHORECTOMY;   Surgeon: Everitt Amber, MD;  Location: [facility];  Service: Gynecology;  Laterality: Bilateral;  . TOE SURGERY Left 1990   "bone spur on great toe"  . TONSILLECTOMY  1977  . ULTRASOUND GUIDANCE FOR VASCULAR ACCESS Right 10/01/2015   Procedure: ULTRASOUND GUIDANCE;  Surgeon: Rolm Bookbinder, MD;  Location: [facility];  Service: General;  Laterality: Right;  . WISDOM TOOTH EXTRACTION       ALLERGIES:  Allergies  Allergen Reactions  . Keflex [Cephalexin] Anaphylaxis  . Decadron [Dexamethasone] Anxiety    Beyond hyper     CURRENT MEDICATIONS:  Outpatient Encounter Prescriptions as of 10/08/2016  Medication Sig  . anastrozole (ARIMIDEX) 1 MG tablet Take 1 tablet (1 mg total) by mouth daily.  . Atorvastatin Calcium (INVESTIGATIONAL ATORVASTATIN/PLACEBO) 40 MG tablet [facility] [phone] Take 1 tablet by mouth daily. Take 1 tablet daily with or without food.  . Biotin (BIOTIN 5000) 5 MG CAPS Take 10 mg by mouth daily.   . Investigational palbociclib (IBRANCE) 125 MG capsule [facility] AFT-05 [name] Take 1 capsule (125 mg total) by mouth daily. Take with food. Swallow whole. Do not chew. Take on days 1-21. Repeat every 28 days.  . Multiple Vitamin (MULTIVITAMIN) tablet Take 1 tablet by mouth daily.  Marland Kitchen  naproxen sodium (ANAPROX) 220 MG tablet Take 220 mg by mouth daily as needed (pain).  . Probiotic Product (PRO-BIOTIC BLEND PO) Take 1 tablet by mouth 2 (two) times daily.   Marland Kitchen terbinafine (LAMISIL) 250 MG tablet Take 1 tablet (250 mg total) by mouth daily.  . [DISCONTINUED] Investigational palbociclib (IBRANCE) 125 MG capsule [facility] AFT-05 [name] Take 1 capsule (125 mg total) by mouth daily. Take with food. Swallow whole. Do not chew. Take on days 1-21. Repeat every 28 days.  . [DISCONTINUED] cyclobenzaprine (FLEXERIL) 10 MG tablet Take 1 tablet (10 mg total) by mouth 2 (two) times daily as needed for muscle spasms.   No facility-administered encounter medications on file as of  10/08/2016.      ONCOLOGIC FAMILY HISTORY:  Family History  Problem Relation Age of Onset  . Depression Mother   . Inflammatory bowel disease Mother   . Alcohol abuse Mother   . Bipolar disorder Mother   . Hypertension Son   . Other Brother        severe intellectual disabilities  . Arthritis Maternal Uncle        hx knee replacement surgeries  . Stroke Maternal Grandmother 86  . Breast cancer Maternal Grandmother        dx before menopause  . Uterine cancer Maternal Grandmother        dx late 37s  . Bipolar disorder Maternal Grandfather   . Stroke Paternal Grandmother 65  . Diabetes Paternal Grandmother        later in life  . Heart attack Paternal Grandfather      SOCIAL HISTORY:  Melanie Barber is single and lives with her significant other in [place], [place].  She has one son, who is 38 and he is in the [employer].  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  She denies any current or history of tobacco, alcohol, or illicit drug use.     PHYSICAL EXAMINATION:  Vital Signs:   Vitals:   10/08/16 0918  BP: (!) 116/48  Pulse: 74  Resp: 18  Temp: 97.7 F (36.5 C)  SpO2: 100%   Filed Weights   10/08/16 0918  Weight: 170 lb 12.8 oz (77.5 kg)   General: Well-nourished, well-appearing female in no acute distress.  She is unaccompanied today.   HEENT: Head is normocephalic.  Pupils equal and reactive to light. Conjunctivae clear without exudate.  Sclerae anicteric. Oral mucosa is pink, moist.  Oropharynx is pink without lesions or erythema.  Lymph: No cervical, supraclavicular, or infraclavicular lymphadenopathy noted on palpation.  Cardiovascular: Regular rate and rhythm.Marland Kitchen Respiratory: Clear to auscultation bilaterally. Chest expansion symmetric; breathing non-labored.  GI: Abdomen soft and round; non-tender, non-distended. Bowel sounds normoactive.  GU: Deferred.  Neuro: No focal deficits. Steady gait.  Psych: Mood and affect normal and  appropriate for situation.  Extremities: No edema. MSK: No focal spinal tenderness to palpation.  Full range of motion in bilateral upper extremities Skin: Warm and dry.  LABORATORY DATA:  None for this visit.  DIAGNOSTIC IMAGING:  None for this visit.      ASSESSMENT AND PLAN:  Ms.. Barber is a pleasant 51 y.o. female with Stage IIB right breast invasive ductal carcinoma, ER+/PR+/HER2-, diagnosed in 08/2015, treated with mastectomy, adjuvant chemotherapy, adjuvant radiation therapy, and anti-estrogen therapy with Anastrozole and Palbociclib (on [name] trial).  She presents to the Survivorship Clinic for our initial meeting and routine follow-up post-completion of treatment for breast cancer.  1. Stage IIB right breast cancer:  Melanie Barber is continuing to recover from definitive treatment for breast cancer. She will follow-up with her medical oncologist, Dr. Lindi Adie in 2 weeks with history and physical exam per surveillance protocol.  She will continue her anti-estrogen therapy with Anastrozole and continue on Palbociclib. Thus far, she is tolerating the treatment well, with minimal side effects (see #2). Today, a comprehensive survivorship care plan and treatment summary was reviewed with the patient today detailing her breast cancer diagnosis, treatment course, potential late/long-term effects of treatment, appropriate follow-up care with recommendations for the future, and patient education resources.  A copy of this summary, along with a letter will be sent to the patient's primary care provider via mail/fax/In Basket message after today's visit.    2. Arthralgias and hot flashes: We reviewed ways to alleviate these. We discussed yoga/tai chi.  We also discussed ways to alleviate hot flashes.  We reviewed medications she could try to help the hot flashes as well.  She doesn't want to take anything for hot flashes as of yet, but may consider it in the future if they continue.    3. Bone  health:  Given Melanie Barber's age/history of breast cancer and her current treatment regimen including anti-estrogen therapy with Anastrozole, she is at risk for bone demineralization.  She has not yet had a DEXA and so I ordered one today to go ahead and be done.  In the meantime, she was encouraged to increase her consumption of foods rich in calcium, as well as increase her weight-bearing activities.  She was given education on specific activities to promote bone health.  4. Cancer screening:  Due to Melanie Barber's history and her age, she should receive screening for skin cancers, colon cancer, and gynecologic cancers.  The information and recommendations are listed on the patient's comprehensive care plan/treatment summary and were reviewed in detail with the patient.    5. Health maintenance and wellness promotion: Melanie Barber was encouraged to consume 5-7 servings of fruits and vegetables per day. We reviewed the "Nutrition Rainbow" handout, as well as the handout "Take Control of Your Health and Reduce Your Cancer Risk" from the [employer].  She was also encouraged to engage in moderate to vigorous exercise for 30 minutes per day most days of the week. We discussed the [employer] fitness program, which is designed for cancer survivors to help them become more physically fit after cancer treatments.  She was instructed to limit her alcohol consumption and continue to abstain from tobacco use.     6. Support services/counseling: It is not uncommon for this period of the patient's cancer care trajectory to be one of many emotions and stressors.  We discussed an opportunity for her to participate in the next session of [facility] ("Finding Your New Normal") support group series designed for patients after they have completed treatment.   Melanie Barber was encouraged to take advantage of our many other support services programs, support groups, and/or counseling in coping with her new life as a  cancer survivor after completing anti-cancer treatment.  She was offered support today through active listening and expressive supportive counseling.  She was given information regarding our available services and encouraged to contact me with any questions or for help enrolling in any of our support group/programs.    Dispo:   -Return to cancer center in two weeks for follow up -DEXA next available -Follow up with surgery in 12/2016 -She is welcome  to return back to the Survivorship Clinic at any time; no additional follow-up needed at this time.  -Consider referral back to survivorship as a long-term survivor for continued surveillance  A total of (60) minutes of face-to-face time was spent with this patient with greater than 50% of that time in counseling and care-coordination.   Gardenia Phlegm, NP Survivorship Program [facility] [phone]   Note: PRIMARY CARE PROVIDER Fanny Bien, [place] [phone]

## 2016-10-08 NOTE — Telephone Encounter (Signed)
Gave patient avs and calendars per 10/5 los

## 2016-10-15 DIAGNOSIS — C50411 Malignant neoplasm of upper-outer quadrant of right female breast: Secondary | ICD-10-CM | POA: Diagnosis not present

## 2016-10-18 ENCOUNTER — Other Ambulatory Visit: Payer: Self-pay | Admitting: General Surgery

## 2016-10-19 ENCOUNTER — Ambulatory Visit (INDEPENDENT_AMBULATORY_CARE_PROVIDER_SITE_OTHER): Payer: 59 | Admitting: Sports Medicine

## 2016-10-19 DIAGNOSIS — M79675 Pain in left toe(s): Secondary | ICD-10-CM

## 2016-10-19 DIAGNOSIS — M79674 Pain in right toe(s): Secondary | ICD-10-CM | POA: Diagnosis not present

## 2016-10-19 DIAGNOSIS — B351 Tinea unguium: Secondary | ICD-10-CM

## 2016-10-19 NOTE — Assessment & Plan Note (Signed)
Bilateral mastectomies 10/01/2015 With reconstruction Right mastectomy: IDC grade 2, multifocal largest 2.5 cm, with high-grade DCIS, broadly present at anterior margin and inferior margin 2/5 LN positive, LVI Present,  Left mastectomy: ALH, ER 95%, PR 5-65%, HER-2 negative, Ki-67 10-20%,  Pathologic stage: T2 N1 a stage IIB Mammaprint: High risk CT-CAP and bone scan: No evidence of metastatic disease ----------------------------------------------------------------------------------------------------------------------------------------------------------------- Treatment summary: 1. Adjuvant chemotherapy with dose dense Adriamycin and Cytoxan 4 followed by Abraxane weekly 12 started 11/05/2015 completed 03/17/2016 3. Followed by adjuvant radiation therapy Completed 06/08/2016 4. Followed by adjuvant antiestrogen therapy with anastrozole 7years  PREVENT trial: [id number]  No toxicities to Study Drug -------------------------------------------------------------------------------------------------------------------------------------------------------- Anastrozole Toxicities: No hot flashes or myalgias. Patient is tolerating anastrozole extremely well.  [name] clinical trial:Patient has been randomized to Palbociclib Palbociclib toxicities: 1. Fatigue grade 1  Return to clinic in 3 months for follow-up 

## 2016-10-19 NOTE — Progress Notes (Signed)
Subjective: Melanie Barber is a 51 y.o. female patient seen today in office for follow up evaluation of nail fungus on Lamisil. Patient states that she is doing well with no adverse reaction. Has notice changes to nails that seem to be getting better. Patient is on stage 4 chemo medication and reports that she is doing ok; hair is growing back. Patient has no other pedal complaints at this time.   Patient Active Problem List   Diagnosis Date Noted  . History of bilateral breast cancer 06/22/2016  . Genetic testing 11/17/2015  . Malignant neoplasm of upper-outer quadrant of right breast in female, estrogen receptor positive ([place]) 10/09/2015  . Breast cancer of upper-outer quadrant of right female breast ([place]) 09/12/2015    Current Outpatient Prescriptions on File Prior to Visit  Medication Sig Dispense Refill  . anastrozole (ARIMIDEX) 1 MG tablet Take 1 tablet (1 mg total) by mouth daily. 90 tablet 3  . Atorvastatin Calcium (INVESTIGATIONAL ATORVASTATIN/PLACEBO) 40 MG tablet [facility] [id number] Take 1 tablet by mouth daily. Take 1 tablet daily with or without food. 180 tablet 0  . Biotin (BIOTIN 5000) 5 MG CAPS Take 10 mg by mouth daily.     . Investigational palbociclib (IBRANCE) 125 MG capsule [facility] AFT-05 [name] Take 1 capsule (125 mg total) by mouth daily. Take with food. Swallow whole. Do not chew. Take on days 1-21. Repeat every 28 days. 23 capsule 0  . Multiple Vitamin (MULTIVITAMIN) tablet Take 1 tablet by mouth daily.    . naproxen sodium (ANAPROX) 220 MG tablet Take 220 mg by mouth daily as needed (pain).    . Probiotic Product (PRO-BIOTIC BLEND PO) Take 1 tablet by mouth 2 (two) times daily.     Marland Kitchen terbinafine (LAMISIL) 250 MG tablet Take 1 tablet (250 mg total) by mouth daily. 90 tablet 0   No current facility-administered medications on file prior to visit.     Allergies  Allergen Reactions  . Keflex [Cephalexin] Anaphylaxis  . Decadron [Dexamethasone] Anxiety     Beyond hyper    Objective: Physical Exam  General: Well developed, nourished, no acute distress, awake, alert and oriented x 3  Vascular: Dorsalis pedis artery 2/4 bilateral, Posterior tibial artery 2/4 bilateral, skin temperature warm to warm proximal to distal bilateral lower extremities, no varicosities, pedal hair present bilateral.  Neurological: Gross sensation present via light touch bilateral.   Dermatological: Skin is warm, dry, and supple bilateral, Nails 1-10 are tender, short thick, and discolored with mild subungal debris, no webspace macerations present bilateral, no open lesions present bilateral, no callus/corns/hyperkeratotic tissue present bilateral. No signs of infection bilateral.  Musculoskeletal: No symptomatic boney deformities noted bilateral. Muscular strength within normal limits without pain on range of motion. No pain with calf compression bilateral.  Assessment and Plan:  Problem List Items Addressed This Visit    None    Visit Diagnoses    Dermatophytosis of nail    -  Primary   Toe pain, bilateral         -Examined patient -Cont with Lamisil; LFTs were reviewed and are normal -Advised good hygiene habits and educated patient on proper foot care to prevent re-infection -Encouraged tea tree oil to nails -Patient to return in 12 weeks final check after finishing Lamisil or sooner if symptoms worsen.  Landis Martins, DPM

## 2016-10-22 ENCOUNTER — Ambulatory Visit (HOSPITAL_BASED_OUTPATIENT_CLINIC_OR_DEPARTMENT_OTHER): Payer: 59 | Admitting: Hematology and Oncology

## 2016-10-22 ENCOUNTER — Telehealth: Payer: Self-pay | Admitting: Hematology and Oncology

## 2016-10-22 ENCOUNTER — Encounter: Payer: Self-pay | Admitting: *Deleted

## 2016-10-22 ENCOUNTER — Other Ambulatory Visit: Payer: 59

## 2016-10-22 ENCOUNTER — Other Ambulatory Visit (HOSPITAL_BASED_OUTPATIENT_CLINIC_OR_DEPARTMENT_OTHER): Payer: 59

## 2016-10-22 ENCOUNTER — Ambulatory Visit: Payer: 59 | Admitting: Hematology and Oncology

## 2016-10-22 DIAGNOSIS — C50411 Malignant neoplasm of upper-outer quadrant of right female breast: Secondary | ICD-10-CM

## 2016-10-22 DIAGNOSIS — Z170 Estrogen receptor positive status [ER+]: Principal | ICD-10-CM

## 2016-10-22 DIAGNOSIS — Z79811 Long term (current) use of aromatase inhibitors: Secondary | ICD-10-CM | POA: Diagnosis not present

## 2016-10-22 DIAGNOSIS — M791 Myalgia, unspecified site: Secondary | ICD-10-CM | POA: Diagnosis not present

## 2016-10-22 DIAGNOSIS — Z9221 Personal history of antineoplastic chemotherapy: Secondary | ICD-10-CM

## 2016-10-22 DIAGNOSIS — Z006 Encounter for examination for normal comparison and control in clinical research program: Secondary | ICD-10-CM

## 2016-10-22 DIAGNOSIS — Z9013 Acquired absence of bilateral breasts and nipples: Secondary | ICD-10-CM | POA: Diagnosis not present

## 2016-10-22 DIAGNOSIS — Z923 Personal history of irradiation: Secondary | ICD-10-CM | POA: Diagnosis not present

## 2016-10-22 LAB — COMPREHENSIVE METABOLIC PANEL
ALT: 20 U/L (ref 0–55)
ANION GAP: 9 meq/L (ref 3–11)
AST: 21 U/L (ref 5–34)
Albumin: 4 g/dL (ref 3.5–5.0)
Alkaline Phosphatase: 68 U/L (ref 40–150)
BUN: 21.4 mg/dL (ref 7.0–26.0)
CHLORIDE: 105 meq/L (ref 98–109)
CO2: 26 meq/L (ref 22–29)
CREATININE: 0.7 mg/dL (ref 0.6–1.1)
Calcium: 9.1 mg/dL (ref 8.4–10.4)
EGFR: >60 mL/min/{1.73_m2} (ref >60)
Glucose: 76 mg/dl (ref 70–140)
Potassium: 4.1 mEq/L (ref 3.5–5.1)
SODIUM: 140 meq/L (ref 136–145)
Total Bilirubin: 0.64 mg/dL (ref 0.20–1.20)
Total Protein: 6.7 g/dL (ref 6.4–8.3)

## 2016-10-22 LAB — CBC WITH DIFFERENTIAL/PLATELET
BASO%: 1.3 % (ref 0.0–2.0)
BASOS ABS: 0 10*3/uL (ref 0.0–0.1)
EOS ABS: 0 10*3/uL (ref 0.0–0.5)
EOS%: 1.8 % (ref 0.0–7.0)
HCT: 34.4 % — ABNORMAL LOW (ref 34.8–46.6)
HEMOGLOBIN: 12 g/dL (ref 11.6–15.9)
LYMPH%: 25 % (ref 14.0–49.7)
MCH: 34.5 pg — ABNORMAL HIGH (ref 25.1–34.0)
MCHC: 34.9 g/dL (ref 31.5–36.0)
MCV: 98.8 fL (ref 79.5–101.0)
MONO#: 0.2 10*3/uL (ref 0.1–0.9)
MONO%: 11.9 % (ref 0.0–14.0)
NEUT#: 1.1 10*3/uL — ABNORMAL LOW (ref 1.5–6.5)
NEUT%: 60 % (ref 38.4–76.8)
PLATELETS: 113 10*3/uL — AB (ref 145–400)
RBC: 3.48 10*6/uL — AB (ref 3.70–5.45)
RDW: 17.4 % — AB (ref 11.2–14.5)
WBC: 1.9 10*3/uL — ABNORMAL LOW (ref 3.9–10.3)
lymph#: 0.5 10*3/uL — ABNORMAL LOW (ref 0.9–3.3)

## 2016-10-22 MED ORDER — INV-PALBOCICLIB 125 MG CAPS #23 ALLIANCE FOUNDATION AFT-05 (PALLAS): 125.0000 mg | ORAL_CAPSULE | Freq: Every day | ORAL | 0 refills | Status: DC

## 2016-10-22 MED ORDER — INV-ATORVASTATIN/PLACEBO 40 MG TABS WAKE FOREST WF 98213: 1.0000 | ORAL_TABLET | Freq: Every day | ORAL | 0 refills | Status: DC

## 2016-10-22 NOTE — Progress Notes (Signed)
10/22/16 at 1:58pm - [name] AFT-05 - cycle 3, day 1 study notes - The pt was into the cancer center this morning for her cycle 3, day 1 assessments.  The pt was given her questionnaires upon arrival to the clinic this morning.  The pt returned her completed cycle 2 drug diaries for her palbociclib and anastrozole daily dosing.  The pt also returned her empty cycle 2 study drug bottle for the study drug accountability check.  The pt's questionnaires were reviewed by a research nurse for completeness and accuracy.  The pt's labs were drawn, and Dr. Lindi Barber reviewed.  The pt's ANC value of 1.1 is acceptable to proceed with cycle 3 today.  The pt was seen and examined by Dr. Lindi Barber.  The pt continues to have some fatigue and arthralgia.  She was recently started on Effexor 37.5 mg to help relieve her hot flashes.  Dr. Lindi Barber and Melanie Barber, pharmacist, verified that there were no drug interactions between the pt's study drug, palbociclib, and her new medication, Effexor.  The pt's medications were reviewed.  Dr. Lindi Barber and the pt discussed her treatment plan with regards to her upcoming breast reconstruction surgery on 12/20/16.  The pt was advised to stop taking her study drug, palbociclib, 10 days before her surgery on 12/10/16.  The pt will complete her 21 day dosing of palbociclib for cycle 4.  The pt's cycle 5 was to start on 12/17/16.  The pt was told to NOT start cycle 5 until she has been cleared by her surgeon and Dr. Lindi Barber.  The pt is very eager to resume her study drug as soon as she can after surgery.  The research nurse completed the IRT transaction for the pt's new study drug bottles.  The pt was dispensed her study drug bottles for cycles 3-5.  The bottles assigned were [id number], [id number], [id number].  The research nurse also gave the pt her new study drug diaries for cycles 3-5.  The pt's cycle 5 diaries state to NOT start this cycle until cleared by Dr. Lindi Barber to begin cycle 5.  The pt was thanked for her  support and compliance with this clinical trial. Brion Aliment RN, BSN, [place]  Clinical Research Nurse 10/22/2016 2:13 PM   Melanie Barber [phone]  10/22/2016  Adverse Event Log  Study/Protocol: [name]/AFT-05 Cycle: 2 AE's through 10/22/16  Event Grade Onset  Date Resolved Date Attribution to Palbociclib Attribution to anastrozole Treatment Comments  White blood cell decreased 2 10/08/16 10/22/16 Yes No Palbociclib +Endocrine therapy Not clinically significant  White blood cell decreased 3 10/22/16 ongoing Yes  No Palbociclib + Endocrine therapy Not clinically significant  Neutrophil count decreased 2 10/22/16 ongoing Yes  No Palbociclib + Endocrine therapy Not clinically significant  Fatigue 1 09/16/16 Ongoing Yes  Yes Palbociclib + Endocrine therapy able to work  Platelet count decreased  1 10/22/16 Ongoing  Yes No Palbociclib + Endocrine therapy Not clinically significant  Vaginal hemorrhage 1 09/24/16 Ongoing  No No Palbociclib +Endocrine therapy No intervention  Hot flashes 2 10/22/16 Ongoing  No  Yes Palbociclib + Endocrine therapy Pt started Effexor 37.5 mg   Arthralgia  1 10/08/16 Ongoing No  Yes Palbociclib + Endocrine therapy

## 2016-10-22 NOTE — Telephone Encounter (Signed)
No 10/19 los.  

## 2016-10-22 NOTE — Progress Notes (Signed)
Patient Care Team: Fanny Bien, MD as PCP - General (Family Medicine) Rolm Bookbinder, MD as Consulting Physician (General Surgery) Nicholas Lose, MD as Consulting Physician (Hematology and Oncology) Delice Bison Charlestine Massed, NP as Nurse Practitioner (Hematology and Oncology) Kyung Rudd, MD as Consulting Physician (Radiation Oncology)  DIAGNOSIS:  Encounter Diagnosis  Name Primary?  . Malignant neoplasm of upper-outer quadrant of right breast in female, estrogen receptor positive ([place])     SUMMARY OF ONCOLOGIC HISTORY:   Breast cancer of upper-outer quadrant of right female breast ([place])   09/02/2015 Mammogram    Right breast UOQ: 2 masses 3 cm apart, U/S they measured 3.4 cm, larger mass at 1.6 cm, in addition, 1 cm mass at 9:00 and 0.9 cm mass at 8:30( could be intramammary LN); right axillary LN 11 mm; microcalcs 10 cm span       09/09/2015 Initial Diagnosis    Right breast biopsy OUQ: DCIS with calcs and necrosis ER 95%, PR 80%; 9:30 position 5cmfn: IDC with DCIS ER 95%, PR 65%, Ki-67 20%,; right biopsy 8:30 position 3cmfn ER 95%, PR 5%, Ki-67 10%: intramammary LN with IDC      09/12/2015 Breast MRI    Multicentric right breast cancer cluster of enhancing masses 4.1 x 2.2 x 2.6 cm, LOQ: 1.1 cm enhancing mass previously biopsied, subcutaneous low right breast 2 cm mass not biopsied, multiple other small enhancing masses, 1 cm right axillary lymph node      10/01/2015 Surgery    Right mastectomy: IDC grade 2, multifocal largest 2.5 cm, with high-grade DCIS, broadly present at  anterior margin and inferior margin 2/5 LN positive, LVI Present,  Left mastectomy: ALH, ER 95%, PR 5-65%, HER-2 negative, Ki-67 10-20%, T2 N1 a stage IIB      11/05/2015 - 03/17/2016 Chemotherapy    Adjuvant chemotherapy with dose dense Adriamycin and Cytoxan 4 followed by Abraxane weekly 12      04/22/2016 - 06/08/2016 Radiation Therapy    Adjuvant radiation therapy      06/22/2016 Surgery   Hysterectomy with bilateral salpingo-oophorectomy: Negative for malignancy      06/30/2016 -  Anti-estrogen oral therapy    Anastrozole 1 mg daily, patient is on a clinical trial [name] ( randomized to Palbociclib)       CHIEF COMPLIANT: Follow-up on Ibrance  INTERVAL HISTORY: Melanie Barber is a 51 year old with above-mentioned history of right breast cancer currently on clinical trial. She is randomized Ibrance. She is tolerating 125 mg dosage fairly well. Her biggest complaints are related to muscle and joint pains. She also complains of occasional fatigue. Beyond this she is appears to be tolerating the treatment extremely well. 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  REVIEW OF SYSTEMS:   Constitutional: Denies fevers, chills or abnormal weight loss Eyes: Denies blurriness of vision Ears, nose, mouth, throat, and face: Denies mucositis or sore throat Respiratory: Denies cough, dyspnea or wheezes Cardiovascular: Denies palpitation, chest discomfort Gastrointestinal:  Denies nausea, heartburn or change in bowel habits Skin: Denies abnormal skin rashes Lymphatics: Denies new lymphadenopathy or easy bruising Neurological:Denies numbness, tingling or new weaknesses Behavioral/Psych: Mood is stable, no new changes  Extremities: No lower extremity edema Breast:  denies any pain or lumps or nodules in either breasts All other systems were reviewed with the patient and are negative.  I have reviewed the past medical history, past surgical history, social history and family history with the patient and they  are unchanged from previous note.  ALLERGIES:  is allergic to keflex [cephalexin] and decadron [dexamethasone].  MEDICATIONS:  Current Outpatient Prescriptions  Medication Sig Dispense Refill  . anastrozole (ARIMIDEX) 1 MG tablet Take 1 tablet (1 mg total) by mouth daily. 90 tablet 3  . Atorvastatin Calcium (INVESTIGATIONAL  ATORVASTATIN/PLACEBO) 40 MG tablet [facility] [id number] Take 1 tablet by mouth daily. Take 1 tablet daily with or without food. 180 tablet 0  . Atorvastatin Calcium (INVESTIGATIONAL ATORVASTATIN/PLACEBO) 40 MG tablet [facility] [id number] Take 1 tablet by mouth daily. Take 1 tablet daily with or without food. 180 tablet 0  . Biotin (BIOTIN 5000) 5 MG CAPS Take 10 mg by mouth daily.     . Investigational palbociclib (IBRANCE) 125 MG capsule [facility] AFT-05 [name] Take 1 capsule (125 mg total) by mouth daily. Take with food. Swallow whole. Do not chew. Take on days 1-21. Repeat every 28 days. 23 capsule 0  . Multiple Vitamin (MULTIVITAMIN) tablet Take 1 tablet by mouth daily.    . naproxen sodium (ANAPROX) 220 MG tablet Take 220 mg by mouth daily as needed (pain).    . Probiotic Product (PRO-BIOTIC BLEND PO) Take 1 tablet by mouth 2 (two) times daily.     Marland Kitchen terbinafine (LAMISIL) 250 MG tablet Take 1 tablet (250 mg total) by mouth daily. 90 tablet 0  . venlafaxine (EFFEXOR) 37.5 MG tablet Take 37.5 mg by mouth daily.     No current facility-administered medications for this visit.     PHYSICAL EXAMINATION: ECOG PERFORMANCE STATUS: 1 - Symptomatic but completely ambulatory  Vitals:   10/22/16 1003  BP: 108/71  Pulse: 78  Resp: 16  Temp: 98.1 F (36.7 C)  SpO2: 96%   Filed Weights   10/22/16 1003  Weight: 168 lb 9.6 oz (76.5 kg)    GENERAL:alert, no distress and comfortable SKIN: skin color, texture, turgor are normal, no rashes or significant lesions EYES: normal, Conjunctiva are pink and non-injected, sclera clear OROPHARYNX:no exudate, no erythema and lips, buccal mucosa, and tongue normal  NECK: supple, thyroid normal size, non-tender, without nodularity LYMPH:  no palpable lymphadenopathy in the cervical, axillary or inguinal LUNGS: clear to auscultation and percussion with normal breathing effort HEART: regular rate & rhythm and no murmurs and no lower extremity  edema ABDOMEN:abdomen soft, non-tender and normal bowel sounds MUSCULOSKELETAL:no cyanosis of digits and no clubbing  NEURO: alert & oriented x 3 with fluent speech, no focal motor/sensory deficits EXTREMITIES: No lower extremity edema  LABORATORY DATA:  I have reviewed the data as listed   Chemistry      Component Value Date/Time   NA 143 10/08/2016 0909   K 4.3 10/08/2016 0909   CL 102 06/23/2016 0533   CO2 28 10/08/2016 0909   BUN 21.2 10/08/2016 0909   CREATININE 0.8 10/08/2016 0909      Component Value Date/Time   CALCIUM 9.4 10/08/2016 0909   ALKPHOS 71 10/08/2016 0909   AST 20 10/08/2016 0909   ALT 16 10/08/2016 0909   BILITOT 0.49 10/08/2016 0909       Lab Results  Component Value Date   WBC 1.9 (L) 10/22/2016   HGB 12.0 10/22/2016   HCT 34.4 (L) 10/22/2016   MCV 98.8 10/22/2016   PLT 113 (L) 10/22/2016   NEUTROABS 1.1 (L) 10/22/2016    ASSESSMENT & PLAN:  Breast cancer of upper-outer quadrant of right female breast ([place]) Bilateral mastectomies 10/01/2015 With reconstruction Right mastectomy: IDC grade 2,  multifocal largest 2.5 cm, with high-grade DCIS, broadly present at anterior margin and inferior margin 2/5 LN positive, LVI Present,  Left mastectomy: ALH, ER 95%, PR 5-65%, HER-2 negative, Ki-67 10-20%,  Pathologic stage: T2 N1 a stage IIB Mammaprint: High risk CT-CAP and bone scan: No evidence of metastatic disease ----------------------------------------------------------------------------------------------------------------------------------------------------------------- Treatment summary: 1. Adjuvant chemotherapy with dose dense Adriamycin and Cytoxan 4 followed by Abraxane weekly 12 started 11/05/2015 completed 03/17/2016 3. Followed by adjuvant radiation therapy Completed 06/08/2016 4. Followed by adjuvant antiestrogen therapy with anastrozole 7years  PREVENT trial: [id number]  No toxicities to Study  Drug -------------------------------------------------------------------------------------------------------------------------------------------------------- Anastrozole Toxicities: No hot flashes or myalgias. Patient is tolerating anastrozole extremely well.  [name] clinical trial:Patient has been randomized to Palbociclib Palbociclib toxicities: 1. Fatigue grade 1 2. Neutropenia: Today's ANC is 1.1. She has adequate counts and will continue Palbociclib  Also complains of muscle stiffness and achiness which could be related to anastrozole. Return to clinic in 3 months for follow-up and labs   I spent 25 minutes talking to the patient of which more than half was spent in counseling and coordination of care.  No orders of the defined types were placed in this encounter.  The patient has a good understanding of the overall plan. she agrees with it. she will call with any problems that may develop before the next visit here.   Rulon Eisenmenger, MD 10/22/16

## 2016-10-22 NOTE — Progress Notes (Signed)
10/22/16 at 12:01pm- PREVENT/ [facility] [id number]- month 12 study notes - The pt was into the cancer center this morning for her continued follow up with Dr. Lindi Adie.  The research nurse gave the pt her month 12 questionnaires upon arrival to the cancer center.  The pt completed her questionnaires, and the research nurse reviewed them for accuracy and completeness.  The pt returned her PREVENT study drug bottle (atorvastatin/placebo) with 4 capsules remaining in the bottle.  The pt said that she has 3 additional pills left at home in her pill box.  The pt denied "any problems taking her study drug".  She reported a new medication that she has started for her hot flashes.  The pt also began anastrozole in July, and she began palbociclib in August to further treat her breast cancer. She denied having any difficulty filling out the medication diaries.  The pt said that she would return her completed October medication diary in November.  The pt was seen and examined by Dr. Lindi Adie this morning.  He felt that she is not having any toxicities from her atorvastatin/placebo study drug.  The pt was informed that she would need to have her cardiac MRI completed after her reconstruction surgery (scheduled for 12/20/16).  The pt was informed that the study prefers to have a delayed 6 month cardiac MRI than to skip this assessment.  The research nurse will contact Dr. Danny Lawless, the study investigator, to ensure that he wants the site to proceed with scheduling her cardiac MRI after her breast reconstruction surgery.  The pt was dispensed her new bottle of study drug for self administration.  The pt was told to continue to take 1 capsule daily.  The pt was also given her medication diaries through May 2019.  The pt was given some self-addressed stamp envelopes to mail her completed medication diaries back to the research nurse.  The pt was thanked for her continued support on the clinical trial.  Brion Aliment RN, BSN, CCRP Clinical  Research Nurse 10/22/2016 12:14 PM

## 2016-10-27 DIAGNOSIS — Z853 Personal history of malignant neoplasm of breast: Secondary | ICD-10-CM | POA: Diagnosis not present

## 2016-10-27 DIAGNOSIS — Z9013 Acquired absence of bilateral breasts and nipples: Secondary | ICD-10-CM | POA: Diagnosis not present

## 2016-10-27 DIAGNOSIS — Z923 Personal history of irradiation: Secondary | ICD-10-CM | POA: Diagnosis not present

## 2016-11-08 ENCOUNTER — Encounter (HOSPITAL_COMMUNITY): Payer: Self-pay | Admitting: Emergency Medicine

## 2016-11-08 ENCOUNTER — Ambulatory Visit (HOSPITAL_COMMUNITY)
Admission: EM | Admit: 2016-11-08 | Discharge: 2016-11-08 | Disposition: A | Discharge status: Home / Self Care | Payer: 59 | Attending: Emergency Medicine | Admitting: Emergency Medicine

## 2016-11-08 DIAGNOSIS — N3001 Acute cystitis with hematuria: Secondary | ICD-10-CM

## 2016-11-08 MED ORDER — CIPROFLOXACIN HCL 500 MG PO TABS: 500.0000 mg | ORAL_TABLET | Freq: Two times a day (BID) | ORAL | 0 refills | Status: DC

## 2016-11-08 NOTE — ED Provider Notes (Signed)
[place]    CSN: [id number] Arrival date & time: 11/08/16  1921     History   Chief Complaint Chief Complaint  Patient presents with  . Recurrent UTI    HPI Melanie Barber is a 51 y.o. female.   51 year old female complaining of bladder spasms, urinary frequency and urgency started rather quickly earlier today. She is currently on chemotherapy. She has nausea due to this. She also notes that she has a low neutrophil count and low white count.      Past Medical History:  Diagnosis Date  . Arthritis    right knee (10/01/2015)  . Cancer of right breast ([place])   . PONV (postoperative nausea and vomiting)   . Sciatic leg pain    left leg    Patient Active Problem List   Diagnosis Date Noted  . History of bilateral breast cancer 06/22/2016  . Genetic testing 11/17/2015  . Malignant neoplasm of upper-outer quadrant of right breast in female, estrogen receptor positive ([place]) 10/09/2015  . Breast cancer of upper-outer quadrant of right female breast ([place]) 09/12/2015    Past Surgical History:  Procedure Laterality Date  . BREAST BIOPSY Right   . BREAST CYST ASPIRATION Right   . BREAST RECONSTRUCTION WITH PLACEMENT OF TISSUE EXPANDER AND FLEX HD (ACELLULAR HYDRATED DERMIS) Bilateral 10/01/2015  . [place]   growth removed  . MASTECTOMY Left 10/01/2015   NIPPLE SPARING  . MASTECTOMY COMPLETE / SIMPLE W/ SENTINEL NODE BIOPSY Right 10/01/2015   NIPPLE SPARING  . NASAL/SINUS ENDOSCOPY Bilateral 2005  . PORTACATH PLACEMENT Right 10/01/2015  . TOE SURGERY Left 1990   "bone spur on great toe"  . TONSILLECTOMY  1977  . WISDOM TOOTH EXTRACTION      OB History    No data available       Home Medications    Prior to Admission medications   Medication Sig Start Date End Date Taking? Authorizing Provider  anastrozole (ARIMIDEX) 1 MG tablet Take 1 tablet (1 mg total) by mouth daily. 07/12/16   Nicholas Lose, MD  Atorvastatin Calcium  (INVESTIGATIONAL ATORVASTATIN/PLACEBO) 40 MG tablet [facility] [phone] Take 1 tablet by mouth daily. Take 1 tablet daily with or without food. 10/22/16   Nicholas Lose, MD  Biotin (BIOTIN 5000) 5 MG CAPS Take 10 mg by mouth daily.     [provider]  ciprofloxacin (CIPRO) 500 MG tablet Take 1 tablet (500 mg total) 2 (two) times daily by mouth. 11/08/16   Janne Napoleon, NP  Investigational palbociclib [name]) 125 MG capsule [facility] AFT-05 [name] Take 1 capsule (125 mg total) by mouth daily. Take with food. Swallow whole. Do not chew. Take on days 1-21. Repeat every 28 days. 10/22/16   Nicholas Lose, MD  Multiple Vitamin (MULTIVITAMIN) tablet Take 1 tablet by mouth daily.    [provider]  naproxen sodium (ANAPROX) 220 MG tablet Take 220 mg by mouth daily as needed (pain).    [provider]  Probiotic Product (PRO-BIOTIC BLEND PO) Take 1 tablet by mouth 2 (two) times daily.     [provider]  terbinafine (LAMISIL) 250 MG tablet Take 1 tablet (250 mg total) by mouth daily. 07/29/16   Landis Martins, DPM  venlafaxine (EFFEXOR) 37.5 MG tablet Take 37.5 mg by mouth daily.    [provider]    Family History Family History  Problem Relation Age of Onset  . Depression Mother   . Inflammatory bowel  disease Mother   . Alcohol abuse Mother   . Bipolar disorder Mother   . Hypertension Son   . Other Brother        severe intellectual disabilities  . Arthritis Maternal Uncle        hx knee replacement surgeries  . Stroke Maternal Grandmother 86  . Breast cancer Maternal Grandmother        dx before menopause  . Uterine cancer Maternal Grandmother        dx late 44s  . Bipolar disorder Maternal Grandfather   . Stroke Paternal Grandmother 43  . Diabetes Paternal Grandmother        later in life  . Heart attack Paternal Grandfather     Social History Social History   Tobacco Use  . Smoking status: Never Smoker  . Smokeless  tobacco: Never Used  Substance Use Topics  . Alcohol use: Yes    Comment: socially  . Drug use: No     Allergies   Keflex [cephalexin] and Decadron [dexamethasone]   Review of Systems Review of Systems  Constitutional: Negative.   Respiratory: Negative.   Gastrointestinal: Negative.   Genitourinary: Positive for frequency and urgency.       As per history of present illness  All other systems reviewed and are negative.    Physical Exam Triage Vital Signs ED Triage Vitals  Enc Vitals Group     BP 11/08/16 1953 116/73     Pulse Rate 11/08/16 1953 84     Resp 11/08/16 1953 18     Temp 11/08/16 1953 98.5 F (36.9 C)     Temp Source 11/08/16 1953 Oral     SpO2 11/08/16 1953 100 %     Weight --      Height --      Head Circumference --      Peak Flow --      Pain Score 11/08/16 1954 5     Pain Loc --      Pain Edu? --      Excl. in [place]? --    No data found.  Updated Vital Signs BP 116/73 (BP Location: Right Arm)   Pulse 84   Temp 98.5 F (36.9 C) (Oral)   Resp 18   SpO2 100%   Visual Acuity Right Eye Distance:   Left Eye Distance:   Bilateral Distance:    Right Eye Near:   Left Eye Near:    Bilateral Near:     Physical Exam  Constitutional: She is oriented to person, place, and time. She appears well-developed and well-nourished. No distress.  Eyes: EOM are normal.  Neck: Normal range of motion. Neck supple.  Cardiovascular: Normal rate.  Pulmonary/Chest: Effort normal. No respiratory distress.  Musculoskeletal: She exhibits no edema.  Neurological: She is alert and oriented to person, place, and time. She exhibits normal muscle tone.  Skin: Skin is warm and dry.  Psychiatric: She has a normal mood and affect.  Nursing note and vitals reviewed.    UC Treatments / Results  Labs (all labs ordered are listed, but only abnormal results are displayed) Labs Reviewed - No data to display  EKG  EKG Interpretation None       Radiology No  results found.  Procedures Procedures (including critical care time)  Medications Ordered in UC Medications - No data to display   Initial Impression / Assessment and Plan / UC Course  I have reviewed the triage vital signs and the  nursing notes.  Pertinent labs & imaging results that were available during my care of the patient were reviewed by me and considered in my medical decision making (see chart for details).    Drink plenty of fluids. Take the antibiotic as directed. Pickup AZO standard also called phenazopyridine to help with urinary symptoms.     Final Clinical Impressions(s) / UC Diagnoses   Final diagnoses:  Acute cystitis with hematuria    ED Discharge Orders        Ordered    ciprofloxacin (CIPRO) 500 MG tablet  2 times daily     11/08/16 2142       Controlled Substance Prescriptions [place] Controlled Substance Registry consulted? Not Applicable   Janne Napoleon, NP 11/08/16 2144

## 2016-11-08 NOTE — Discharge Instructions (Signed)
Drink plenty of fluids. Take the antibiotic as directed. Pickup AZO standard also called phenazopyridine to help with urinary symptoms.

## 2016-11-08 NOTE — ED Triage Notes (Signed)
Pt sts some nausea x 1 week and UTI sx; pt taking oral chemo currently x 3 months

## 2016-11-09 LAB — POCT URINALYSIS DIP (DEVICE)
Bilirubin Urine: NEGATIVE
Glucose, UA: NEGATIVE mg/dL
Ketones, ur: NEGATIVE mg/dL
NITRITE: POSITIVE — AB
PH: 6 (ref 5.0–8.0)
Protein, ur: >=300 mg/dL — AB
Specific Gravity, Urine: >=1.03 (ref 1.005–1.030)
UROBILINOGEN UA: 0.2 mg/dL (ref 0.0–1.0)

## 2016-11-11 ENCOUNTER — Encounter: Payer: Self-pay | Admitting: *Deleted

## 2016-11-11 NOTE — Progress Notes (Signed)
11/11/16 at 3:58pm - Follow up call after recent ED visit- The research nurse was informed that the pt had an ED visit on 11/08/16 due to nausea and UTI symptoms.  The pt was seen and discharged on 11/08/16.  The pt was diagnosed with acute cystitis with hematuria.  She was prescribed an antibiotic.  The research nurse called the pt today to check on her status.  The pt was also reminded to mail in her [place] October medication diary.  The research nurse will await the pt's return call for further information on her status. Dr. Lindi Adie was notified of the pt's ED visit,  and he was asked to provided attributions related to her new symptoms  Brion Aliment RN, BSN, [place] Nurse 11/11/2016 4:02 PM   11/11/16 at 5:05pm - The pt called the research nurse.  The pt confirmed that her nausea (grade 1) began on 11/01/16.  The pt said that she probably was not drinking enough which lead to her urinary infection.  The pt said that she is feeling better now.  The pt said that she has mailed her PREVENT medication diary.  The pt requested Dr. Lindi Adie call in Zofran 4 mg in case her nausea returns.  The research nurse will communicate with Dr. Lindi Adie about the pt's Zofran request.   Brion Aliment RN, BSN, CCRP Clinical Research Nurse 11/11/2016 5:08 PM

## 2016-11-12 ENCOUNTER — Other Ambulatory Visit: Payer: Self-pay

## 2016-11-12 ENCOUNTER — Telehealth: Payer: Self-pay

## 2016-11-12 MED ORDER — ONDANSETRON HCL 8 MG PO TABS: 4.0000 mg | ORAL_TABLET | Freq: Three times a day (TID) | ORAL | 0 refills | Status: DC | PRN

## 2016-11-12 NOTE — Telephone Encounter (Signed)
Left VM for pt informing her her prescription for 4mg  zofran was sent to [facility] outpatient pharmacy. Advised her she could also use the 8mg  tablets she had and cut them in half and take one half as prescribed.  Cyndia Bent RN

## 2016-11-16 MED FILL — VENLAFAXINE HCL ER 37.5 MG: 37.5 | 30 days supply | Qty: 30 | Fill #0

## 2016-12-10 ENCOUNTER — Telehealth: Payer: Self-pay | Admitting: *Deleted

## 2016-12-10 NOTE — Telephone Encounter (Signed)
12/10/16 at 10:24am - [name] study notes- The research nurse called the pt to remind her to stop taking her palbociclib due to the upcoming surgery on 12/20/16.  The pt was not available by phone so the research nurse left a message for the pt.  The pt was encouraged to call the research nurse if she had any questions. The pt was instructed to remain off of her study drug until she is evaluated on 01/10/17 by Dr. Lindi Adie. Brion Aliment RN, BSN, CCRP Clinical Research Nurse 12/10/2016 10:33 AM

## 2016-12-14 ENCOUNTER — Telehealth (HOSPITAL_COMMUNITY): Payer: Self-pay | Admitting: Vascular Surgery

## 2016-12-14 ENCOUNTER — Other Ambulatory Visit: Payer: Self-pay | Admitting: *Deleted

## 2016-12-14 NOTE — Patient Outreach (Signed)
[facility]) Care Management  12/14/2016  Melanie Barber May 15, 1965 [id number]   Subjective: Telephone call to patient's home number, no answer, left HIPAA compliant voicemail message, and requested call back.    Objective:  Per KPN (Knowledge Performance Now, point of care tool) and chart review, patient to be admitted on 12/20/16 for LATISSIMUS FLAP TO BREAST right, REMOVAL OF BILATERAL TISSUE EXPANDERS WITH PLACEMENT OF BILATERAL SILICONE BREAST IMPLANTS, LIPOFILLING FROM ABDOMEN TO BILATERAL CHEST, and LIPOSUCTION WITH LIPOFILLING at [facility].      Assessment: Received UMR Preoperative / Transition of care referral on 11/29/16.   Preoperative call follow up pending patient contact.      Plan: RNCM will call patient for 2nd telephone outreach attempt, preoperative call follow up, within 10 business days if no return call.      Melanie Barber, BSN, [place] Management [facility] Telephonic CM Phone: [phone] Fax: [phone]

## 2016-12-14 NOTE — Telephone Encounter (Signed)
Left pt message to make 6 month brst appt w/ echo in Feb w/ DB

## 2016-12-15 ENCOUNTER — Ambulatory Visit: Payer: Self-pay | Admitting: *Deleted

## 2016-12-15 ENCOUNTER — Other Ambulatory Visit: Payer: Self-pay

## 2016-12-15 ENCOUNTER — Encounter (HOSPITAL_COMMUNITY): Payer: Self-pay

## 2016-12-15 ENCOUNTER — Encounter (HOSPITAL_COMMUNITY)
Admission: RE | Admit: 2016-12-15 | Discharge: 2016-12-15 | Disposition: A | Discharge status: Home / Self Care | Payer: 59 | Source: Ambulatory Visit | Attending: Plastic Surgery | Admitting: Plastic Surgery

## 2016-12-15 ENCOUNTER — Other Ambulatory Visit: Payer: Self-pay | Admitting: *Deleted

## 2016-12-15 DIAGNOSIS — Z9013 Acquired absence of bilateral breasts and nipples: Secondary | ICD-10-CM | POA: Insufficient documentation

## 2016-12-15 DIAGNOSIS — Z853 Personal history of malignant neoplasm of breast: Secondary | ICD-10-CM | POA: Insufficient documentation

## 2016-12-15 DIAGNOSIS — Z923 Personal history of irradiation: Secondary | ICD-10-CM | POA: Diagnosis not present

## 2016-12-15 DIAGNOSIS — N390 Urinary tract infection, site not specified: Secondary | ICD-10-CM | POA: Diagnosis not present

## 2016-12-15 LAB — BASIC METABOLIC PANEL
Anion gap: 9 (ref 5–15)
BUN: 12 mg/dL (ref 6–20)
CALCIUM: 9.3 mg/dL (ref 8.9–10.3)
CO2: 25 mmol/L (ref 22–32)
CREATININE: 0.68 mg/dL (ref 0.44–1.00)
Chloride: 105 mmol/L (ref 101–111)
GLUCOSE: 92 mg/dL (ref 65–99)
Potassium: 3.8 mmol/L (ref 3.5–5.1)
Sodium: 139 mmol/L (ref 135–145)

## 2016-12-15 LAB — CBC WITH DIFFERENTIAL/PLATELET
BASOS PCT: 1 %
Basophils Absolute: 0 10*3/uL (ref 0.0–0.1)
EOS PCT: 1 %
Eosinophils Absolute: 0 10*3/uL (ref 0.0–0.7)
HEMATOCRIT: 35 % — AB (ref 36.0–46.0)
Hemoglobin: 12.1 g/dL (ref 12.0–15.0)
Lymphocytes Relative: 37 %
Lymphs Abs: 0.8 10*3/uL (ref 0.7–4.0)
MCH: 35.4 pg — ABNORMAL HIGH (ref 26.0–34.0)
MCHC: 34.6 g/dL (ref 30.0–36.0)
MCV: 102.3 fL — AB (ref 78.0–100.0)
MONO ABS: 0.2 10*3/uL (ref 0.1–1.0)
MONOS PCT: 10 %
NEUTROS ABS: 1.1 10*3/uL — AB (ref 1.7–7.7)
Neutrophils Relative %: 51 %
PLATELETS: 133 10*3/uL — AB (ref 150–400)
RBC: 3.42 MIL/uL — ABNORMAL LOW (ref 3.87–5.11)
RDW: 14.4 % (ref 11.5–15.5)
WBC: 2.2 10*3/uL — ABNORMAL LOW (ref 4.0–10.5)

## 2016-12-15 MED FILL — VENLAFAXINE HCL ER 37.5 MG: 37.5 | 30 days supply | Qty: 30 | Fill #1

## 2016-12-15 NOTE — Progress Notes (Signed)
PCP - Rachell Cipro Cardiologist - denies  Chest x-ray - not needed EKG - not needed Stress Test - denies ECHO - 2018 Cardiac Cath -denies    Anesthesia review: NO  Patient denies shortness of breath, fever, cough and chest pain at PAT appointment   Patient verbalized understanding of instructions that were given to them at the PAT appointment. Patient was also instructed that they will need to review over the PAT instructions again at home before surgery.

## 2016-12-15 NOTE — Patient Outreach (Signed)
[facility]) Care Management  12/15/2016  Melanie Barber 1965-04-11 [id number]   Subjective: Telephone call to patient's home / mobile  number, no answer, left HIPAA compliant voicemail message, and requested call back.    Objective:  Per KPN (Knowledge Performance Now, point of care tool) and chart review, patient to be admitted on 12/20/16 for LATISSIMUS FLAP TO BREAST right, REMOVAL OF BILATERAL TISSUE EXPANDERS WITH PLACEMENT OF BILATERAL SILICONE BREAST IMPLANTS, LIPOFILLING FROM ABDOMEN TO BILATERAL CHEST, and LIPOSUCTION WITH LIPOFILLING at [facility].      Assessment: Received UMR Preoperative / Transition of care referral on 11/29/16.   Preoperative call follow up pending patient contact.      Plan: RNCM will call patient for 3rd telephone outreach attempt, preoperative call follow up, within 10 business days if no return call.      Semaj Coburn H. Annia Friendly, BSN, [place] Management [facility] Telephonic CM Phone: [phone] Fax: [phone]

## 2016-12-15 NOTE — Pre-Procedure Instructions (Signed)
Melanie Barber  12/15/2016      [place], [place] - [address] [facility]. [address] [place] [address] Phone: [phone] Fax: [phone]    Your procedure is scheduled on December 17  Report to [place] at [place].M.  Call this number if you have problems the morning of surgery:  [phone]   Remember:  Do not eat food or drink liquids after midnight.  Continue all medications as directed by your physician except follow these medication instructions before surgery below   Take these medicines the morning of surgery with A SIP OF WATER anastrozole (ARIMIDEX) venlafaxine XR (EFFEXOR-XR)  7 days prior to surgery STOP taking any Aspirin(unless otherwise instructed by your surgeon), Aleve, Naproxen, Ibuprofen, Motrin, Advil, Goody's, BC's, all herbal medications, fish oil, and all vitamins    Do not wear jewelry, make-up or nail polish.  Do not wear lotions, powders, or perfumes, or deodorant.  Do not shave 48 hours prior to surgery.  Men may shave face and neck.  Do not bring valuables to the hospital.  [facility] is not responsible for any belongings or valuables.  Contacts, dentures or bridgework may not be worn into surgery.  Leave your suitcase in the car.  After surgery it may be brought to your room.  For patients admitted to the hospital, discharge time will be determined by your treatment team.  Patients discharged the day of surgery will not be allowed to drive home.   Special instructions:   [place]- Preparing For Surgery  Before surgery, you can play an important role. Because skin is not sterile, your skin needs to be as free of germs as possible. You can reduce the number of germs on your skin by washing with CHG (chlorahexidine gluconate) Soap before surgery.  CHG is an antiseptic cleaner which kills germs and bonds with the skin to continue killing germs even after  washing.  Please do not use if you have an allergy to CHG or antibacterial soaps. If your skin becomes reddened/irritated stop using the CHG.  Do not shave (including legs and underarms) for at least 48 hours prior to first CHG shower. It is OK to shave your face.  Please follow these instructions carefully.   1. Shower the NIGHT BEFORE SURGERY and the MORNING OF SURGERY with CHG.   2. If you chose to wash your hair, wash your hair first as usual with your normal shampoo.  3. After you shampoo, rinse your hair and body thoroughly to remove the shampoo.  4. Use CHG as you would any other liquid soap. You can apply CHG directly to the skin and wash gently with a scrungie or a clean washcloth.   5. Apply the CHG Soap to your body ONLY FROM THE NECK DOWN.  Do not use on open wounds or open sores. Avoid contact with your eyes, ears, mouth and genitals (private parts). Wash Face and genitals (private parts)  with your normal soap.  6. Wash thoroughly, paying special attention to the area where your surgery will be performed.  7. Thoroughly rinse your body with warm water from the neck down.  8. DO NOT shower/wash with your normal soap after using and rinsing off the CHG Soap.  9. Pat yourself dry with a CLEAN TOWEL.  10. Wear CLEAN PAJAMAS to bed the night before surgery, wear comfortable clothes the morning of surgery  11. Place CLEAN SHEETS on  your bed the night of your first shower and DO NOT SLEEP WITH PETS.    Day of Surgery: Do not apply any deodorants/lotions. Please wear clean clothes to the hospital/surgery center.      Please read over the following fact sheets that you were given.

## 2016-12-15 NOTE — Patient Outreach (Addendum)
[facility]) Care Management  12/15/2016  Melanie Barber [birth year] [id number]   Subjective: Telephone call from patient's home / mobile number, spoke with patient, and HIPAA verified.  Discussed [facility] Care Management UMR Transition of care follow up, preoperative call follow up, patient voiced understanding, and is in agreement to both types of follow up.  Patient states she is doing well, ready to get surgery over with, will have surgery at [facility] on 12/20/16, estimated length of stay 1 - 2 nights, and has assistance with activities of daily living / home management as needed.    Patient voices understanding of medical diagnosis, pending surgery,  and treatment plan. 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  States she is accessing the following [facility] benefits: outpatient pharmacy, hospital indemnity, critical illness,  long term disability, and family medical leave act (FMLA) in place. Patient states she does not have any preoperative questions, care coordination, disease management, disease monitoring, transportation, community resource, or pharmacy needs at this time.   RNCM advised patient of [facility] Employee benefit for Advanced Directives document completion through [place], patient voiced understanding, and is appreciative of the information.   States she is very appreciative of the follow up and is in agreement to receive [facility] Management information post transition of care follow up.    Objective:Per KPN (Knowledge Performance Now, point of care tool) and chart review,patient to be admitted on 12/20/16 forLATISSIMUS FLAP TO BREASTright,REMOVAL OF BILATERAL TISSUE EXPANDERS WITH PLACEMENT OF BILATERAL SILICONE BREAST IMPLANTS, LIPOFILLING FROM ABDOMEN TO BILATERAL CHEST, andLIPOSUCTION WITH [place] [facility].     Assessment:Received [facility]MR Preoperative / Transition of care referral on 11/29/16.  Preoperative call completed, and transition of care follow up pending notification of patient discharge.     Plan:RNCM will call patient for  telephone outreach attempt, transition of care follow up, within 3 business days of hospital discharge notification.      Melanie Barber H. Annia Friendly, BSN, [facility] Management [facility] Telephonic CM Phone: [phone] Fax: [phone]

## 2016-12-16 ENCOUNTER — Inpatient Hospital Stay (HOSPITAL_COMMUNITY)
Admission: RE | Admit: 2016-12-16 | Discharge: 2016-12-16 | Disposition: A | Discharge status: Home / Self Care | Payer: 59 | Source: Ambulatory Visit

## 2016-12-16 ENCOUNTER — Telehealth: Payer: Self-pay | Admitting: *Deleted

## 2016-12-16 NOTE — Telephone Encounter (Signed)
12/16/16 at 2:47pm - [name] follow up call - The pt called and stated that her latest labs revealed a low ANC value.  She said that her surgeon was concerned about her planned surgery on 12/20/16 due to this low ANC value.  The research nurse told the pt that she would notify Dr. Lindi Adie with the lab results and also check the protocol.  The pt asked if she could receive "neupogen" or "neulasta" to help increase her white cells.  The protocol specifically states that "growth factors are, in addition, not allowed in the course of elective surgical procedures".  The research nurse informed Dr. Lindi Adie about the pt's lab values and about her recent UTI infections.  Dr. Lindi Adie called and spoke to the pt.  He told her that growth factors are not necessary for her status now.  He said that her [place] was acceptable for surgery.  He also sent her surgeon an in-basket message stating that he was okay with the pt having her planned surgery.  Dr. Lindi Adie also offered to have her labs checked on Saturday.  He will follow up with the pt regarding where her labs will be drawn.  Brion Aliment RN, BSN, CCRP Clinical Research Nurse 12/16/2016 2:53 PM

## 2016-12-16 NOTE — Progress Notes (Addendum)
Anesthesia Chart Review: Patient is a 51 year old female scheduled for latissimus flap to breat, removal of bilateral tissue expanders with placement of bilateral silicone breast implants, lipofilling from abdomen to bilateral chest, liposuction with lipofilling on 12/20/16 by Dr. Irene Limbo.  History includes never smoker, post-operative N/V, right breast cancer s/p bilateral mastectomy/tissue expander placement/Port-a-cath insertion 10/01/15 (s/p chemoradiation [adjuvant chemotherapy Adriamycin/Cytoxan/Abraxane]; s/p PAC removal 04/01/16), hysterectomy/BSO 06/22/16.   PCP is Dr. Rachell Cipro. HEM-ONC is Dr. Nicholas Lose.  Meds include Remicade index, atorvastatin (versus placebo for PREVENT study). Ibrance (oncology instructed her to hold for 10 days prior to surgery), melatonin, Effexor XR.    BP 125/80   Pulse 88   Temp 36.7 C   Resp 18   Ht 5\' 7"  (1.702 m)   Wt 166 lb 12.8 oz (75.7 kg)   SpO2 98%   BMI 26.12 kg/m   EKG 09/26/15: NSR with sinus arrhythmia.  Echo 08/11/16: Study Conclusions - Left ventricle: Systolic function was normal. The estimated   ejection fraction was in the range of 60% to 65%. Wall motion was   normal; there were no regional wall motion abnormalities. Left   ventricular diastolic function parameters were normal. - Impressions: LS 13.2 GLS -19.0% Impressions: - LS 13.2 GLS -19.0%  Preoperative labs noted. BMET WNL. WBC 2.2 (up from 1.9 on 10/22/16; Neut % 51, NEUT # 1.1). H/H 12.1/35.0, PLT 133K. Ibrance on hold. Voice message left for Stephan Minister at Dr. Para Skeans office regarding CBC results since patient is leukopenic, although stable over the past two months. Neut # is just over  > 1.0 K/uL.   If surgeon feels lab results are acceptable for planned procedure and otherwise no acute changes then I anticipate that she can proceed from an anesthesia standpoint. (Update 12/16/16 1:40 PM: I spoke with Dr. Iran Planas. She had already reviewed labs and  discussed with patient. She has instructed patient to review with her study nurse, but is not anticipating that patient will require additional treatment for [place]. She would like CBC with differential repeated on the day of surgery. Order entered.)  [facility] Short Stay Center/Anesthesiology Phone [phone] 12/16/2016 12:49 PM

## 2016-12-19 ENCOUNTER — Other Ambulatory Visit (HOSPITAL_COMMUNITY)
Admission: RE | Admit: 2016-12-19 | Discharge: 2016-12-19 | Disposition: A | Discharge status: Home / Self Care | Payer: 59 | Source: Ambulatory Visit | Attending: Hematology and Oncology | Admitting: Hematology and Oncology

## 2016-12-19 ENCOUNTER — Emergency Department (HOSPITAL_COMMUNITY): Admission: EM | Admit: 2016-12-19 | Discharge: 2016-12-19 | Discharge status: Absent without leave | Payer: 59

## 2016-12-19 DIAGNOSIS — Z170 Estrogen receptor positive status [ER+]: Secondary | ICD-10-CM | POA: Diagnosis not present

## 2016-12-19 DIAGNOSIS — C50411 Malignant neoplasm of upper-outer quadrant of right female breast: Secondary | ICD-10-CM | POA: Diagnosis not present

## 2016-12-19 LAB — CBC WITH DIFFERENTIAL/PLATELET
BASOS ABS: 0.1 10*3/uL (ref 0.0–0.1)
Basophils Relative: 3 %
EOS ABS: 0 10*3/uL (ref 0.0–0.7)
Eosinophils Relative: 1 %
HEMATOCRIT: 32.5 % — AB (ref 36.0–46.0)
Hemoglobin: 11.7 g/dL — ABNORMAL LOW (ref 12.0–15.0)
LYMPHS PCT: 33 %
Lymphs Abs: 0.9 10*3/uL (ref 0.7–4.0)
MCH: 36.6 pg — ABNORMAL HIGH (ref 26.0–34.0)
MCHC: 36 g/dL (ref 30.0–36.0)
MCV: 101.6 fL — ABNORMAL HIGH (ref 78.0–100.0)
Monocytes Absolute: 0.4 10*3/uL (ref 0.1–1.0)
Monocytes Relative: 16 %
NEUTROS PCT: 47 %
Neutro Abs: 1.4 10*3/uL — ABNORMAL LOW (ref 1.7–7.7)
Platelets: 148 10*3/uL — ABNORMAL LOW (ref 150–400)
RBC: 3.2 MIL/uL — AB (ref 3.87–5.11)
RDW: 14.1 % (ref 11.5–15.5)
WBC: 2.8 10*3/uL — AB (ref 4.0–10.5)

## 2016-12-20 ENCOUNTER — Inpatient Hospital Stay (HOSPITAL_COMMUNITY)
Admission: RE | Admit: 2016-12-20 | Discharge: 2016-12-22 | DRG: 585 | Disposition: A | Discharge status: Home / Self Care | Payer: 59 | Source: Ambulatory Visit | Attending: Plastic Surgery | Admitting: Plastic Surgery

## 2016-12-20 ENCOUNTER — Other Ambulatory Visit: Payer: Self-pay

## 2016-12-20 ENCOUNTER — Encounter (HOSPITAL_COMMUNITY): Admission: RE | Payer: Self-pay | Source: Ambulatory Visit

## 2016-12-20 ENCOUNTER — Inpatient Hospital Stay (HOSPITAL_COMMUNITY): Payer: 59 | Admitting: Vascular Surgery

## 2016-12-20 ENCOUNTER — Inpatient Hospital Stay (HOSPITAL_COMMUNITY): Admission: RE | Admit: 2016-12-20 | Payer: Self-pay | Source: Ambulatory Visit | Admitting: Plastic Surgery

## 2016-12-20 ENCOUNTER — Encounter (HOSPITAL_COMMUNITY): Payer: Self-pay | Admitting: General Practice

## 2016-12-20 ENCOUNTER — Encounter (HOSPITAL_COMMUNITY)
Admission: RE | Disposition: A | Discharge status: Home / Self Care | Payer: Self-pay | Source: Ambulatory Visit | Attending: Plastic Surgery

## 2016-12-20 DIAGNOSIS — Z853 Personal history of malignant neoplasm of breast: Secondary | ICD-10-CM | POA: Diagnosis not present

## 2016-12-20 DIAGNOSIS — Z923 Personal history of irradiation: Secondary | ICD-10-CM

## 2016-12-20 DIAGNOSIS — Z9011 Acquired absence of right breast and nipple: Secondary | ICD-10-CM | POA: Diagnosis not present

## 2016-12-20 DIAGNOSIS — Z9013 Acquired absence of bilateral breasts and nipples: Secondary | ICD-10-CM | POA: Diagnosis not present

## 2016-12-20 DIAGNOSIS — Z411 Encounter for cosmetic surgery: Secondary | ICD-10-CM | POA: Diagnosis not present

## 2016-12-20 DIAGNOSIS — Z9221 Personal history of antineoplastic chemotherapy: Secondary | ICD-10-CM | POA: Diagnosis not present

## 2016-12-20 DIAGNOSIS — Z421 Encounter for breast reconstruction following mastectomy: Secondary | ICD-10-CM | POA: Diagnosis not present

## 2016-12-20 HISTORY — PX: LATISSIMUS FLAP TO BREAST: SHX5357

## 2016-12-20 HISTORY — DX: Major depressive disorder, single episode, unspecified: F32.9

## 2016-12-20 HISTORY — PX: LIPOSUCTION WITH LIPOFILLING: SHX6436

## 2016-12-20 HISTORY — DX: Depression, unspecified: F32.A

## 2016-12-20 HISTORY — PX: REMOVAL OF BILATERAL TISSUE EXPANDERS WITH PLACEMENT OF BILATERAL BREAST IMPLANTS: SHX6431

## 2016-12-20 LAB — DIFFERENTIAL
Basophils Absolute: 0.1 10*3/uL (ref 0.0–0.1)
Basophils Relative: 2 %
EOS PCT: 2 %
Eosinophils Absolute: 0.1 10*3/uL (ref 0.0–0.7)
LYMPHS ABS: 0.6 10*3/uL — AB (ref 0.7–4.0)
LYMPHS PCT: 24 %
MONO ABS: 0.4 10*3/uL (ref 0.1–1.0)
MONOS PCT: 14 %
NEUTROS ABS: 1.4 10*3/uL — AB (ref 1.7–7.7)
Neutrophils Relative %: 58 %

## 2016-12-20 LAB — CBC
HEMATOCRIT: 31.8 % — AB (ref 36.0–46.0)
HEMOGLOBIN: 11 g/dL — AB (ref 12.0–15.0)
MCH: 35.5 pg — AB (ref 26.0–34.0)
MCHC: 34.6 g/dL (ref 30.0–36.0)
MCV: 102.6 fL — AB (ref 78.0–100.0)
PLATELETS: 129 10*3/uL — AB (ref 150–400)
RBC: 3.1 MIL/uL — AB (ref 3.87–5.11)
RDW: 14.1 % (ref 11.5–15.5)
WBC: 2.5 10*3/uL — AB (ref 4.0–10.5)

## 2016-12-20 SURGERY — RECONSTRUCTION, BREAST, USING LATISSIMUS DORSI MYOCUTANEOUS FLAP
Anesthesia: General | Site: Breast | Laterality: Right

## 2016-12-20 SURGERY — REMOVAL, TISSUE EXPANDER, BREAST, BILATERAL, WITH BILATERAL IMPLANT IMPLANT INSERTION
Anesthesia: General

## 2016-12-20 MED ORDER — OXYCODONE HCL 5 MG PO TABS
5.0000 mg | ORAL_TABLET | ORAL | Status: DC | PRN
  Administered 2016-12-20 – 2016-12-22 (×4): 10 mg via ORAL
  Filled 2016-12-20 (×3): qty 2

## 2016-12-20 MED ORDER — MIDAZOLAM HCL 2 MG/2ML IJ SOLN: 0.5000 mg | Freq: Once | INTRAMUSCULAR | Status: DC | PRN

## 2016-12-20 MED ORDER — BISACODYL 5 MG PO TBEC
5.0000 mg | DELAYED_RELEASE_TABLET | Freq: Every day | ORAL | Status: DC | PRN
  Administered 2016-12-21: 5 mg via ORAL
  Filled 2016-12-20: qty 1

## 2016-12-20 MED ORDER — GABAPENTIN 300 MG PO CAPS
300.0000 mg | ORAL_CAPSULE | ORAL | Status: AC
  Administered 2016-12-20: 300 mg via ORAL

## 2016-12-20 MED ORDER — SODIUM BICARBONATE 4 % IV SOLN
INTRAVENOUS | Status: AC
  Filled 2016-12-20: qty 5

## 2016-12-20 MED ORDER — SODIUM CHLORIDE 0.9 % IJ SOLN
INTRAMUSCULAR | Status: DC | PRN
  Administered 2016-12-20 (×2): 10 mL

## 2016-12-20 MED ORDER — LACTATED RINGERS IV SOLN
INTRAVENOUS | Status: DC | PRN
  Administered 2016-12-20 (×3): via INTRAVENOUS

## 2016-12-20 MED ORDER — DIPHENHYDRAMINE HCL 50 MG/ML IJ SOLN
INTRAMUSCULAR | Status: DC | PRN
  Administered 2016-12-20: 12.5 mg via INTRAVENOUS

## 2016-12-20 MED ORDER — EPINEPHRINE PF 1 MG/10ML IJ SOSY
PREFILLED_SYRINGE | INTRAMUSCULAR | Status: AC
  Filled 2016-12-20: qty 10

## 2016-12-20 MED ORDER — ACETAMINOPHEN 500 MG PO TABS
ORAL_TABLET | ORAL | Status: AC
  Filled 2016-12-20: qty 2

## 2016-12-20 MED ORDER — METHOCARBAMOL 500 MG PO TABS
500.0000 mg | ORAL_TABLET | Freq: Three times a day (TID) | ORAL | Status: DC | PRN
  Administered 2016-12-20 – 2016-12-21 (×4): 500 mg via ORAL
  Filled 2016-12-20 (×4): qty 1

## 2016-12-20 MED ORDER — CHLORHEXIDINE GLUCONATE CLOTH 2 % EX PADS: 6.0000 | MEDICATED_PAD | Freq: Once | CUTANEOUS | Status: DC

## 2016-12-20 MED ORDER — DOCUSATE SODIUM 100 MG PO CAPS
200.0000 mg | ORAL_CAPSULE | Freq: Every day | ORAL | Status: DC | PRN
  Administered 2016-12-21: 200 mg via ORAL
  Filled 2016-12-20: qty 2

## 2016-12-20 MED ORDER — PROPOFOL 10 MG/ML IV BOLUS
INTRAVENOUS | Status: DC | PRN
  Administered 2016-12-20: 200 mg via INTRAVENOUS

## 2016-12-20 MED ORDER — PHENYLEPHRINE HCL 10 MG/ML IJ SOLN
INTRAVENOUS | Status: DC | PRN
  Administered 2016-12-20: 30 ug/min via INTRAVENOUS

## 2016-12-20 MED ORDER — NITROFURANTOIN MONOHYD MACRO 100 MG PO CAPS
100.0000 mg | ORAL_CAPSULE | Freq: Two times a day (BID) | ORAL | Status: DC
  Administered 2016-12-20 – 2016-12-22 (×3): 100 mg via ORAL
  Filled 2016-12-20 (×4): qty 1

## 2016-12-20 MED ORDER — SCOPOLAMINE 1 MG/3DAYS TD PT72
MEDICATED_PATCH | TRANSDERMAL | Status: AC
  Filled 2016-12-20: qty 1

## 2016-12-20 MED ORDER — GABAPENTIN 300 MG PO CAPS
ORAL_CAPSULE | ORAL | Status: AC
  Filled 2016-12-20: qty 1

## 2016-12-20 MED ORDER — MIDAZOLAM HCL 2 MG/2ML IJ SOLN
INTRAMUSCULAR | Status: AC
  Filled 2016-12-20: qty 2

## 2016-12-20 MED ORDER — FENTANYL CITRATE (PF) 250 MCG/5ML IJ SOLN
INTRAMUSCULAR | Status: AC
  Filled 2016-12-20: qty 5

## 2016-12-20 MED ORDER — PROPOFOL 10 MG/ML IV BOLUS
INTRAVENOUS | Status: AC
  Filled 2016-12-20: qty 20

## 2016-12-20 MED ORDER — CELECOXIB 200 MG PO CAPS
200.0000 mg | ORAL_CAPSULE | ORAL | Status: AC
  Administered 2016-12-20: 200 mg via ORAL

## 2016-12-20 MED ORDER — PROMETHAZINE HCL 25 MG/ML IJ SOLN: 6.2500 mg | INTRAMUSCULAR | Status: DC | PRN

## 2016-12-20 MED ORDER — VENLAFAXINE HCL ER 37.5 MG PO CP24: 37.5000 mg | ORAL_CAPSULE | Freq: Every day | ORAL | Status: DC

## 2016-12-20 MED ORDER — KETOROLAC TROMETHAMINE 30 MG/ML IJ SOLN
30.0000 mg | Freq: Three times a day (TID) | INTRAMUSCULAR | Status: AC
  Administered 2016-12-20 – 2016-12-21 (×3): 30 mg via INTRAVENOUS
  Filled 2016-12-20 (×3): qty 1

## 2016-12-20 MED ORDER — LIDOCAINE HCL (CARDIAC) 20 MG/ML IV SOLN
INTRAVENOUS | Status: DC | PRN
  Administered 2016-12-20: 40 mg via INTRAVENOUS

## 2016-12-20 MED ORDER — SCOPOLAMINE 1 MG/3DAYS TD PT72
MEDICATED_PATCH | TRANSDERMAL | Status: DC | PRN
  Administered 2016-12-20: 1 via TRANSDERMAL

## 2016-12-20 MED ORDER — ACETAMINOPHEN 500 MG PO TABS
1000.0000 mg | ORAL_TABLET | ORAL | Status: AC
  Administered 2016-12-20: 1000 mg via ORAL

## 2016-12-20 MED ORDER — 0.9 % SODIUM CHLORIDE (POUR BTL) OPTIME
TOPICAL | Status: DC | PRN
  Administered 2016-12-20 (×2): 1000 mL

## 2016-12-20 MED ORDER — SUGAMMADEX SODIUM 200 MG/2ML IV SOLN
INTRAVENOUS | Status: DC | PRN
  Administered 2016-12-20: 151.4 mg via INTRAVENOUS

## 2016-12-20 MED ORDER — OXYCODONE HCL 5 MG PO TABS
ORAL_TABLET | ORAL | Status: AC
  Filled 2016-12-20: qty 2

## 2016-12-20 MED ORDER — CLINDAMYCIN PHOSPHATE 600 MG/50ML IV SOLN
600.0000 mg | Freq: Three times a day (TID) | INTRAVENOUS | Status: AC
  Administered 2016-12-20 – 2016-12-21 (×3): 600 mg via INTRAVENOUS
  Filled 2016-12-20 (×3): qty 50

## 2016-12-20 MED ORDER — ENOXAPARIN SODIUM 40 MG/0.4ML ~~LOC~~ SOLN
40.0000 mg | SUBCUTANEOUS | Status: DC
  Administered 2016-12-21 – 2016-12-22 (×2): 40 mg via SUBCUTANEOUS
  Filled 2016-12-20 (×2): qty 0.4

## 2016-12-20 MED ORDER — HYDROMORPHONE HCL 1 MG/ML IJ SOLN: 0.2500 mg | INTRAMUSCULAR | Status: DC | PRN

## 2016-12-20 MED ORDER — DEXAMETHASONE SODIUM PHOSPHATE 10 MG/ML IJ SOLN
INTRAMUSCULAR | Status: AC
  Filled 2016-12-20: qty 1

## 2016-12-20 MED ORDER — ONDANSETRON 4 MG PO TBDP: 4.0000 mg | ORAL_TABLET | Freq: Four times a day (QID) | ORAL | Status: DC | PRN

## 2016-12-20 MED ORDER — DEXAMETHASONE SODIUM PHOSPHATE 10 MG/ML IJ SOLN
INTRAMUSCULAR | Status: DC | PRN
  Administered 2016-12-20: 10 mg via INTRAVENOUS

## 2016-12-20 MED ORDER — CELECOXIB 200 MG PO CAPS
ORAL_CAPSULE | ORAL | Status: AC
  Filled 2016-12-20: qty 1

## 2016-12-20 MED ORDER — BUPIVACAINE LIPOSOME 1.3 % IJ SUSP
20.0000 mL | INTRAMUSCULAR | Status: AC
  Administered 2016-12-20: 20 mL
  Filled 2016-12-20: qty 20

## 2016-12-20 MED ORDER — MEPERIDINE HCL 25 MG/ML IJ SOLN: 6.2500 mg | INTRAMUSCULAR | Status: DC | PRN

## 2016-12-20 MED ORDER — DIPHENHYDRAMINE HCL 50 MG/ML IJ SOLN
INTRAMUSCULAR | Status: AC
  Filled 2016-12-20: qty 1

## 2016-12-20 MED ORDER — FENTANYL CITRATE (PF) 100 MCG/2ML IJ SOLN
INTRAMUSCULAR | Status: DC | PRN
  Administered 2016-12-20: 50 ug via INTRAVENOUS
  Administered 2016-12-20: 200 ug via INTRAVENOUS
  Administered 2016-12-20 (×3): 50 ug via INTRAVENOUS

## 2016-12-20 MED ORDER — ROCURONIUM BROMIDE 100 MG/10ML IV SOLN
INTRAVENOUS | Status: DC | PRN
  Administered 2016-12-20: 50 mg via INTRAVENOUS
  Administered 2016-12-20: 30 mg via INTRAVENOUS
  Administered 2016-12-20: 20 mg via INTRAVENOUS
  Administered 2016-12-20: 10 mg via INTRAVENOUS

## 2016-12-20 MED ORDER — SUCCINYLCHOLINE CHLORIDE 200 MG/10ML IV SOSY
PREFILLED_SYRINGE | INTRAVENOUS | Status: AC
  Filled 2016-12-20: qty 10

## 2016-12-20 MED ORDER — ONDANSETRON HCL 4 MG/2ML IJ SOLN
4.0000 mg | Freq: Four times a day (QID) | INTRAMUSCULAR | Status: DC | PRN
  Administered 2016-12-22: 4 mg via INTRAVENOUS
  Filled 2016-12-20: qty 2

## 2016-12-20 MED ORDER — PROPOFOL 500 MG/50ML IV EMUL
INTRAVENOUS | Status: DC | PRN
  Administered 2016-12-20: 25 ug/kg/min via INTRAVENOUS

## 2016-12-20 MED ORDER — ONDANSETRON HCL 4 MG/2ML IJ SOLN
INTRAMUSCULAR | Status: DC | PRN
  Administered 2016-12-20: 4 mg via INTRAVENOUS

## 2016-12-20 MED ORDER — ROCURONIUM BROMIDE 10 MG/ML (PF) SYRINGE
PREFILLED_SYRINGE | INTRAVENOUS | Status: AC
  Filled 2016-12-20: qty 5

## 2016-12-20 MED ORDER — HEPARIN SODIUM (PORCINE) 5000 UNIT/ML IJ SOLN
INTRAMUSCULAR | Status: AC
  Filled 2016-12-20: qty 1

## 2016-12-20 MED ORDER — MIDAZOLAM HCL 5 MG/5ML IJ SOLN
INTRAMUSCULAR | Status: DC | PRN
  Administered 2016-12-20: 2 mg via INTRAVENOUS

## 2016-12-20 MED ORDER — GABAPENTIN 300 MG PO CAPS
300.0000 mg | ORAL_CAPSULE | Freq: Two times a day (BID) | ORAL | Status: DC
  Administered 2016-12-20 – 2016-12-22 (×4): 300 mg via ORAL
  Filled 2016-12-20 (×4): qty 1

## 2016-12-20 MED ORDER — SODIUM CHLORIDE 0.9 % IR SOLN
Status: DC | PRN
  Administered 2016-12-20: 500 mL

## 2016-12-20 MED ORDER — SODIUM BICARBONATE 4 % IV SOLN
Freq: Once | INTRAVENOUS | Status: AC
  Administered 2016-12-20: 10:00:00 via INTRAMUSCULAR
  Filled 2016-12-20: qty 50

## 2016-12-20 MED ORDER — KCL IN DEXTROSE-NACL 20-5-0.45 MEQ/L-%-% IV SOLN
INTRAVENOUS | Status: DC
  Administered 2016-12-20 – 2016-12-21 (×2): via INTRAVENOUS
  Filled 2016-12-20 (×2): qty 1000

## 2016-12-20 MED ORDER — HEPARIN SODIUM (PORCINE) 5000 UNIT/ML IJ SOLN
5000.0000 [IU] | Freq: Once | INTRAMUSCULAR | Status: AC
  Administered 2016-12-20: 5000 [IU] via SUBCUTANEOUS

## 2016-12-20 MED ORDER — HYDROMORPHONE HCL 1 MG/ML IJ SOLN
0.5000 mg | INTRAMUSCULAR | Status: DC | PRN
  Administered 2016-12-21 (×3): 1 mg via INTRAVENOUS
  Filled 2016-12-20 (×3): qty 1

## 2016-12-20 MED ORDER — SODIUM CHLORIDE 0.9 % IV SOLN
INTRAVENOUS | Status: DC | PRN
  Administered 2016-12-20: 500 mL

## 2016-12-20 MED ORDER — CLINDAMYCIN PHOSPHATE 900 MG/50ML IV SOLN
900.0000 mg | INTRAVENOUS | Status: AC
  Administered 2016-12-20: 900 mg via INTRAVENOUS
  Filled 2016-12-20: qty 50

## 2016-12-20 SURGICAL SUPPLY — 114 items
APPLIER CLIP 9.375 MED OPEN (MISCELLANEOUS) ×4
BAG DECANTER FOR FLEXI CONT (MISCELLANEOUS) ×4 IMPLANT
BINDER ABDOMINAL 12 ML 46-62 (SOFTGOODS) ×4 IMPLANT
BINDER BREAST XLRG (GAUZE/BANDAGES/DRESSINGS) ×4 IMPLANT
BLADE SURG 10 STRL SS (BLADE) ×4 IMPLANT
BLADE SURG 11 STRL SS (BLADE) IMPLANT
BLADE SURG 15 STRL LF DISP TIS (BLADE) ×3 IMPLANT
BLADE SURG 15 STRL SS (BLADE) ×1
BNDG COHESIVE 4X5 TAN STRL (GAUZE/BANDAGES/DRESSINGS) ×4 IMPLANT
CANISTER LIPO FAT HARVEST (MISCELLANEOUS) ×4 IMPLANT
CANISTER SUCT 1200ML W/VALVE (MISCELLANEOUS) IMPLANT
CANISTER SUCT 3000ML PPV (MISCELLANEOUS) ×8 IMPLANT
CHLORAPREP W/TINT 26ML (MISCELLANEOUS) ×16 IMPLANT
CLIP APPLIE 9.375 MED OPEN (MISCELLANEOUS) ×3 IMPLANT
COVER MAYO STAND STRL (DRAPES) ×8 IMPLANT
COVER SURGICAL LIGHT HANDLE (MISCELLANEOUS) ×12 IMPLANT
DERMABOND ADVANCED (GAUZE/BANDAGES/DRESSINGS) ×2
DERMABOND ADVANCED .7 DNX12 (GAUZE/BANDAGES/DRESSINGS) ×6 IMPLANT
DRAIN CHANNEL 15F RND FF W/TCR (WOUND CARE) ×4 IMPLANT
DRAIN CHANNEL 19F RND (DRAIN) ×4 IMPLANT
DRAPE HALF SHEET 40X57 (DRAPES) ×8 IMPLANT
DRAPE INCISE 23X17 IOBAN STRL (DRAPES) ×1
DRAPE INCISE IOBAN 23X17 STRL (DRAPES) ×3 IMPLANT
DRAPE INCISE IOBAN 85X60 (DRAPES) ×4 IMPLANT
DRAPE ORTHO SPLIT 77X108 STRL (DRAPES) ×4
DRAPE SURG ORHT 6 SPLT 77X108 (DRAPES) ×12 IMPLANT
DRAPE U-SHAPE 76X120 STRL (DRAPES) ×4 IMPLANT
DRAPE WARM FLUID 44X44 (DRAPE) ×4 IMPLANT
DRSG MEPILEX BORDER 4X8 (GAUZE/BANDAGES/DRESSINGS) IMPLANT
ELECT BLADE 4.0 EZ CLEAN MEGAD (MISCELLANEOUS) ×4
ELECT BLADE 6.5 EXT (BLADE) ×4 IMPLANT
ELECT CAUTERY BLADE 6.4 (BLADE) ×4 IMPLANT
ELECT COATED BLADE 2.86 ST (ELECTRODE) ×4 IMPLANT
ELECT REM PT RETURN 9FT ADLT (ELECTROSURGICAL) ×4
ELECTRODE BLDE 4.0 EZ CLN MEGD (MISCELLANEOUS) ×3 IMPLANT
ELECTRODE REM PT RTRN 9FT ADLT (ELECTROSURGICAL) ×3 IMPLANT
EVACUATOR SILICONE 100CC (DRAIN) ×8 IMPLANT
FILTER LIPOSUCTION (MISCELLANEOUS) IMPLANT
GAUZE XEROFORM 5X9 LF (GAUZE/BANDAGES/DRESSINGS) IMPLANT
GLOVE BIO SURGEON STRL SZ 6 (GLOVE) ×24 IMPLANT
GLOVE BIOGEL PI IND STRL 6 (GLOVE) ×6 IMPLANT
GLOVE BIOGEL PI IND STRL 6.5 (GLOVE) ×9 IMPLANT
GLOVE BIOGEL PI IND STRL 7.0 (GLOVE) ×3 IMPLANT
GLOVE BIOGEL PI IND STRL 7.5 (GLOVE) ×6 IMPLANT
GLOVE BIOGEL PI INDICATOR 6 (GLOVE) ×2
GLOVE BIOGEL PI INDICATOR 6.5 (GLOVE) ×3
GLOVE BIOGEL PI INDICATOR 7.0 (GLOVE) ×1
GLOVE BIOGEL PI INDICATOR 7.5 (GLOVE) ×2
GLOVE ECLIPSE 7.5 STRL STRAW (GLOVE) ×8 IMPLANT
GLOVE SURG SS PI 6.5 STRL IVOR (GLOVE) ×8 IMPLANT
GOWN STRL REUS W/ TWL LRG LVL3 (GOWN DISPOSABLE) ×21 IMPLANT
GOWN STRL REUS W/ TWL XL LVL3 (GOWN DISPOSABLE) ×3 IMPLANT
GOWN STRL REUS W/TWL LRG LVL3 (GOWN DISPOSABLE) ×7
GOWN STRL REUS W/TWL XL LVL3 (GOWN DISPOSABLE) ×1
IMPL BREAST P6.3XRND MDRT 560 (Breast) ×6 IMPLANT
IMPL BRST P6.3XRND MDRT 560CC (Breast) ×6 IMPLANT
IMPLANT BREAST GEL 560CC (Breast) ×2 IMPLANT
KIT BASIN OR (CUSTOM PROCEDURE TRAY) ×4 IMPLANT
KIT ROOM TURNOVER OR (KITS) ×4 IMPLANT
LINER CANISTER 1000CC FLEX (MISCELLANEOUS) IMPLANT
MARKER SKIN DUAL TIP RULER LAB (MISCELLANEOUS) ×4 IMPLANT
NDL SAFETY ECLIPSE 18X1.5 (NEEDLE) IMPLANT
NEEDLE 22X1 1/2 (OR ONLY) (NEEDLE) ×4 IMPLANT
NEEDLE HYPO 18GX1.5 SHARP (NEEDLE)
NS IRRIG 1000ML POUR BTL (IV SOLUTION) ×8 IMPLANT
PACK GENERAL/GYN (CUSTOM PROCEDURE TRAY) ×4 IMPLANT
PAD ABD 8X10 STRL (GAUZE/BANDAGES/DRESSINGS) ×16 IMPLANT
PAD ALCOHOL SWAB (MISCELLANEOUS) IMPLANT
PAD ARMBOARD 7.5X6 YLW CONV (MISCELLANEOUS) ×8 IMPLANT
PEN SKIN MARKING BROAD (MISCELLANEOUS) ×4 IMPLANT
PENCIL BUTTON HOLSTER BLD 10FT (ELECTRODE) IMPLANT
PIN SAFETY STERILE (MISCELLANEOUS) ×4 IMPLANT
SET ASEPTIC TRANSFER (MISCELLANEOUS) ×4 IMPLANT
SET COLLECT BLD 21X3/4 12 PB (MISCELLANEOUS) IMPLANT
SIZER BREAST REUSE GEL 525CC (SIZER) ×4
SIZER BREAST REUSE GEL 545CC (SIZER) ×4
SIZER BREAST REUSE GEL 560CC (SIZER) ×4
SIZER BREAST REUSE GEL 580CC (SIZER) ×4
SIZER BRST REUSE GEL 525CC (SIZER) ×3 IMPLANT
SIZER BRST REUSE GEL 545CC (SIZER) ×3 IMPLANT
SIZER BRST REUSE GEL 560CC (SIZER) ×3 IMPLANT
SIZER BRST REUSE GEL 580CC (SIZER) ×3 IMPLANT
SLEEVE SCD COMPRESS KNEE MED (MISCELLANEOUS) IMPLANT
SOLUTION BETADINE 4OZ (MISCELLANEOUS) ×4 IMPLANT
SPONGE LAP 18X18 X RAY DECT (DISPOSABLE) ×4 IMPLANT
STAPLER VISISTAT 35W (STAPLE) ×4 IMPLANT
STOCKINETTE IMPERVIOUS 9X36 MD (GAUZE/BANDAGES/DRESSINGS) ×4 IMPLANT
SUT ETHILON 2 0 FS 18 (SUTURE) ×8 IMPLANT
SUT MNCRL AB 4-0 PS2 18 (SUTURE) ×12 IMPLANT
SUT PDS AB 2-0 CT1 27 (SUTURE) IMPLANT
SUT PDS AB 2-0 CT2 27 (SUTURE) ×24 IMPLANT
SUT VIC AB 3-0 PS2 18 (SUTURE)
SUT VIC AB 3-0 PS2 18XBRD (SUTURE) IMPLANT
SUT VIC AB 3-0 SH 27 (SUTURE) ×3
SUT VIC AB 3-0 SH 27X BRD (SUTURE) ×9 IMPLANT
SUT VIC AB 3-0 SH 8-18 (SUTURE) IMPLANT
SUT VIC AB 4-0 PS2 18 (SUTURE) ×4 IMPLANT
SUT VICRYL 4-0 PS2 18IN ABS (SUTURE) ×4 IMPLANT
SUT VLOC 180 0 24IN GS25 (SUTURE) ×8 IMPLANT
SYR 10ML LL (SYRINGE) ×20 IMPLANT
SYR 50ML LL SCALE MARK (SYRINGE) ×8 IMPLANT
SYR 50ML SLIP (SYRINGE) IMPLANT
SYR BULB IRRIGATION 50ML (SYRINGE) ×4 IMPLANT
SYR CONTROL 10ML LL (SYRINGE) ×4 IMPLANT
SYR TB 1ML 26GX3/8 SAFETY (SYRINGE) IMPLANT
SYRINGE IRR TOOMEY STRL 70CC (SYRINGE) ×4 IMPLANT
TAPE STRIPS DRAPE STRL (GAUZE/BANDAGES/DRESSINGS) ×4 IMPLANT
TRAY FOLEY W/METER SILVER 16FR (SET/KITS/TRAYS/PACK) ×4 IMPLANT
TUBE CONNECTING 12X1/4 (SUCTIONS) ×4 IMPLANT
TUBE CONNECTING 20X1/4 (TUBING) IMPLANT
TUBING AUTOFUSE DISPOSABLE (MISCELLANEOUS) ×4 IMPLANT
TUBING INFILTRATION IT-10001 (TUBING) ×4 IMPLANT
TUBING SET GRADUATE ASPIR 12FT (MISCELLANEOUS) ×4 IMPLANT
UNDERPAD 30X30 (UNDERPADS AND DIAPERS) IMPLANT

## 2016-12-20 NOTE — Anesthesia Procedure Notes (Signed)
Procedure Name: Intubation Date/Time: 12/20/2016 7:17 AM Performed by: Lavell Luster, CRNA Pre-anesthesia Checklist: Patient identified, Emergency Drugs available, Suction available, Patient being monitored and Timeout performed Patient Re-evaluated:Patient Re-evaluated prior to induction Oxygen Delivery Method: Circle system utilized Preoxygenation: Pre-oxygenation with 100% oxygen Induction Type: IV induction Ventilation: Mask ventilation without difficulty Laryngoscope Size: Mac and 4 Grade View: Grade I Tube type: Oral Tube size: 7.0 mm Number of attempts: 1 Airway Equipment and Method: Stylet Placement Confirmation: ETT inserted through vocal cords under direct vision,  breath sounds checked- equal and bilateral and positive ETCO2 Secured at: 22 cm Tube secured with: Tape Dental Injury: Teeth and Oropharynx as per pre-operative assessment

## 2016-12-20 NOTE — Anesthesia Preprocedure Evaluation (Addendum)
Anesthesia Evaluation  Patient identified by MRN, date of birth, ID band Patient awake    Reviewed: Allergy & Precautions, NPO status , Patient's Chart, lab work & pertinent test results  History of Anesthesia Complications (+) PONV  Airway Mallampati: II  TM Distance: >3 FB Neck ROM: Full    Dental  (+) Teeth Intact, Dental Advisory Given, Chipped,    Pulmonary neg pulmonary ROS,    breath sounds clear to auscultation       Cardiovascular (-) anginanegative cardio ROS   Rhythm:Regular Rate:Normal  8/18 ECHO: EF 60-65%, valves OK   Neuro/Psych  Neuromuscular disease    GI/Hepatic negative GI ROS, Neg liver ROS,   Endo/Other  negative endocrine ROS  Renal/GU negative Renal ROS     Musculoskeletal  (+) Arthritis , Osteoarthritis,    Abdominal (+)  Abdomen: soft. Bowel sounds: normal.  Peds  Hematology negative hematology ROS (+)   Anesthesia Other Findings Breast cancer  Reproductive/Obstetrics                          Anesthesia Physical Anesthesia Plan  ASA: II  Anesthesia Plan: General   Post-op Pain Management:    Induction: Intravenous  PONV Risk Score and Plan: 4 or greater and Ondansetron, Dexamethasone, Propofol infusion, Scopolamine patch - Pre-op and Diphenhydramine  Airway Management Planned: Oral ETT  Additional Equipment:   Intra-op Plan:   Post-operative Plan: Extubation in OR  Informed Consent: I have reviewed the patients History and Physical, chart, labs and discussed the procedure including the risks, benefits and alternatives for the proposed anesthesia with the patient or authorized representative who has indicated his/her understanding and acceptance.   Dental advisory given  Plan Discussed with: CRNA and Surgeon  Anesthesia Plan Comments: (Plan routine monitors, GETA)        Anesthesia Quick Evaluation

## 2016-12-20 NOTE — Op Note (Signed)
Operative Note   DATE OF OPERATION: 12.17.18  LOCATION: [facility] Main OR-observation  SURGICAL DIVISION: Plastic Surgery  PREOPERATIVE DIAGNOSES:  1. History right breast cancer 2. Acquired absence breasts 3. History therapeutic radiation  POSTOPERATIVE DIAGNOSES:  same  PROCEDURE:  1. Removal bilateral tissue expanders and placement silicone implants 2. Right latissimus flap to right chest 3. Lipofilling from abdomen to bilateral chest  SURGEON: Irene Limbo MD MBA  ASSISTANT: Caryl Asp RNFA  ANESTHESIA:  General.   EBL: 240 ml  COMPLICATIONS: None immediate.   INDICATIONS FOR PROCEDURE:  The patient, Melanie Barber, is a 51 y.o. female born on 1965-10-17, is here for second stage reconstruction following bilateral nipple sparing mastectomies and immediate expander acellular dermis reconstruction. She received adjuvant chemotherapy and radiation to right chest.   FINDINGS: Herma Carson Smooth Round Extra Projection 560 ml implants placed bilateral, REF SRX-560 RIGHT SN [id number] LEFT SN [id number] 40 ml fat infiltrated over left chest, 50 ml over right chest.  DESCRIPTION OF PROCEDURE:  The patient's operative site was marked with the patient in the preoperative area including sternal notch, chest midline, anterior axillary lines, breast meridians.Supra and infraumbilical abdomen marked for fat harvest. The patient was taken to the operating room. SCDs were placed and IV antibiotics were given. Foley catheter placed. Patient was placed inleftlateral position and prepped and draped. A time out was performed and all information was confirmed to be correct. Incision made through priorright inframammary foldscar.Skin flaps elevated off underlying pectoralis muscle.The expander was removed. Additional skin flap elevated off anterior pectoalis surface to accommodate implant. The pectoralis was sutured to chest wall with 0 V-lock suture.Incision made surrounding skin paddle  designed over rightback. Skin and superficial fascia elevated off surface of latissimus muscle and subcutaneous tunnel dissected to axilla joining anterior breast cavity. Anterior border of latissimus identified and elevated. Muscle divided inferiorly at superior iliac spine. Submuscular dissection completed toward midline back and toward origin. Thoracodorsal nerve wasidentified anddivided. Flap rotated into anterior chest cavity. Back irrigated and hemostasis ensured. Exparel infiltrated total 266 mg forbilateralchest and back. 15Fr drain placed and secured with 2-0 nylon. 2-0 PDS used to placed quilting sutures from elevated skin flaps to chest wall. Incision closed with 0 V lock suture in superficial fascia and dermis. Skin closure completed with 4-0 monocryl subcuticular and tissue glue applied. The latissimus flap was placed in chest cavity, mastectomy flaps stapled over flap. The patient was placed in supine position and re prepped and draped.   The flap muscle was redraped withinrightchest.The entire skin paddle brought with latissimus was removed.The muscle was secured to pectoralis and serratus muscle and inferiorly to abdominal wall fascia with interrupted 2-0 PDS. 29 Fr JP drain placed in breast cavity and secured to chest with 2-0 nylon. A sizer was placed beneath latissimus muscle.  Over left chest, incision made in prior inframammary scar and carried through superficial fascia andimplant capsule. Expander removed. Acellular dermis noted to be completely incorporated. Capsulotomies performed superiorly and medially. Scoring of anterior capsule over lower pole completed. The IMF was stabilized with interrupted 2-0 PDS from inferior capsule to chest wall.  Breasts tailor tacked closed, and patient brought to upright sitting position and assessed for symmetry.A NatrelleInspira Smooth Round Extra Projection 560 implant was selected forbilateral chest.  Patent returned to supine  position. Stab incision made over bilateral lateral abdomen in prior laparoscopic scars and tumescent fluid infiltrated over supra and infraumbilical adbomen, total 200 ml tumescent infiltrated. Power assisted liposuction performed to endpoint symmetric  contour and soft tissue thickness. The fat was then washed and prepared by gravity for infiltration. Harvested fat was then infiltrated in subcutaneous plane throughout mastectomy flaps bilateral.   Bilateral cavities irrigated with solution containing polymyxin and bacitracin,hemostasis obtained.Cavities then irrigated with Betadine. Implant placed in left chest cavity. Superficial fascia closed with running 3-0 vicryl followed by 4-0 vicryl in dermis, 4-0 monocryl subcuticular for skin closure.  Right implant placed below latissimus muscle. Remainder oflatissimus muscle sutured to chest wall withPDS sutures.Closure incision completed with 3-0 vicryl in superficial fascia, 4-0 vicryl in dermis, 4-0 monocryl for skin closure. The abdominal incisions were approximated with 4-0 monocryl.  Tissue adhesive was applied to breast and back incisions. Dry dressing and breast binder, abdominal binder applied.  The patient was allowed to wake from anesthesia, extubated and taken to the recovery room in satisfactory condition.   SPECIMENS: none  DRAINS: 15 Fr JP in right back, 19 Fr JP in right chest  Irene Limbo, MD [facility] [phone], pin [phone]

## 2016-12-20 NOTE — H&P (Signed)
Subjective:     Patient ID: Melanie Barber is a 51 y.o. female.  HPI 15 months post op. Completed adjuvant chemotherapy and adjuvant radiation ( 6.5.18).  On PREVENT trial and has started [name] trial.   Presented following screening MMG with Right breast UOQ: 2 masses 3 cm apart, U/S, larger mass at 1.6 cm, in addition, 1 cm mass at 9:00 and 0.9 cm mass at 8:30(could be intramammary LN); right axillary LN 11 mm; microcalcs 10 cm span. MRI In demonstrated right breast, UOQ with cluster of enhancing masses and clumped NME, together measured 4.1 x 2.3 x 2.6 cm. In the LOQ, a 1.0 cm enhancing mass associated with the third biopsy marking clip, corresponding with the metastatic intramammary lymph node and separate focus of invasive mammary carcinoma. In the subareolar right breast, there is a spiculated enhancing mass 2.0 x 1.3 cm x 1.2 cm. There are multiple other smaller enhancing masses involving the superior, central and inferior right breast. There was a questionably abnormal slightly subpectoral lymph node in the right axilla, not biopsied due to location.Final pathology left breast with ALH. Right breast multiple foci tumor, largest 2.5 cm, ER/PR+, Her2 -. Two specimens labeled as positive for IDC; have discussed orientation of these with Dr. Donne Hazel and these areas are continuous with final margins clear. SLN 2/5 +.   Mammaprint high risk. 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  Prior D cup, happy with this. Right mastectomy 571 g Left 502 g  Review of Systems     Objective:   Physical Exam  Cardiovascular: Normal rate, regular rhythm and normal heart sounds.   Pulmonary/Chest: Effort normal and breath sounds normal.  Abdominal: Soft.  Healing laparoscopic scars   Chest: left mastectomy flap thicker and has descended over expander , right  chest hyperpigmentation post XRT SN to nipple R 22 L 25 cm BW R 17 L 17 cm (CW 13.5) Nipple to IMF R 7 L 8 cm  Assessment:     Right breast ca multicentric T2 N1 stage IIB S/p bilateral NSM, TE/ADM (Cortiva) reconstruction Adjuvant chemotherapy, radiation.    Plan:     On [name]- plan to hold 10 d prior to surgery.    Plan to wait appr 6 months post end XRT for implant exchange. Plan smooth silicone implants. Reviewed implant types today, plan capacity filled silicone. Reviewed mastectomy weights vs current expander size- she is currently using there former bras with insert- desires to return to D cup and agree that she will need implant volume greater than current TE, that this appropriate for her frame.   Reviewed use of fat grafting, abdominal donor site, may improve softness and undo some of radiation changes. However discussed variable take graft, may need to repeat, pain donor site, fat necroisis that presents as masses. Recommend she purchase compression garment for home use.  Plan right latissimus flap. Reviewed with radiation risks reconstruction approach 40% and include contracture, wound healing problems. eviewed latissimus flap wouldreduce risks to baseline on radiated side. Reviewed drains on right chest and back, risk seroma back. Discussed the pectoralis may be placed back on chest on right, latissimus will cover implant and may require different size implants for each chest as soft tissue will be different thickness.   Natrelle 133MX-12-T 400 ml tissue expanders placed bilateral,  RIGHT 510 ml total fill volume LEFT 400 ml total fill volume  Irene Limbo, MD [facility] Plastic & Reconstructive Surgery [phone], pin [phone]

## 2016-12-20 NOTE — Transfer of Care (Signed)
Immediate Anesthesia Transfer of Care Note  Patient: Melanie Barber  Procedure(s) Performed: RIGHT LATISSIMUS FLAP TO BREAST (Right Back) REMOVAL OF BILATERAL TISSUE EXPANDERS WITH PLACEMENT OF BILATERAL SILICONE BREAST IMPLANTS, LIPOFILLING FROM ABDOMEN TO BILATERAL CHEST (Bilateral Breast) LIPOSUCTION WITH LIPOFILLING (N/A Abdomen)  Patient Location: PACU  Anesthesia Type:General  Level of Consciousness: awake, alert  and sedated  Airway & Oxygen Therapy: Patient connected to face mask oxygen  Post-op Assessment: Post -op Vital signs reviewed and stable  Post vital signs: stable  Last Vitals:  Vitals:   12/20/16 0606 12/20/16 1218  BP: 113/72 124/78  Pulse: 87 82  Resp: 18 15  Temp: 36.8 C (!) (P) 36.4 C  SpO2: 98% 100%    Last Pain:  Vitals:   12/20/16 1218  TempSrc:   PainSc: (P) Asleep      Patients Stated Pain Goal: 2 (32/54/98 2641)  Complications: No apparent anesthesia complications

## 2016-12-21 DIAGNOSIS — Z853 Personal history of malignant neoplasm of breast: Secondary | ICD-10-CM | POA: Diagnosis present

## 2016-12-21 DIAGNOSIS — Z923 Personal history of irradiation: Secondary | ICD-10-CM | POA: Diagnosis not present

## 2016-12-21 DIAGNOSIS — Z421 Encounter for breast reconstruction following mastectomy: Secondary | ICD-10-CM | POA: Diagnosis not present

## 2016-12-21 DIAGNOSIS — Z9011 Acquired absence of right breast and nipple: Secondary | ICD-10-CM | POA: Diagnosis not present

## 2016-12-21 DIAGNOSIS — Z9221 Personal history of antineoplastic chemotherapy: Secondary | ICD-10-CM | POA: Diagnosis not present

## 2016-12-21 MED ORDER — SULFAMETHOXAZOLE-TRIMETHOPRIM 800-160 MG PO TABS: 1.0000 | ORAL_TABLET | Freq: Two times a day (BID) | ORAL | 0 refills | Status: DC

## 2016-12-21 MED ORDER — OXYCODONE HCL 5 MG PO TABS: 5.0000 mg | ORAL_TABLET | ORAL | 0 refills | Status: DC | PRN

## 2016-12-21 MED ORDER — VENLAFAXINE HCL ER 37.5 MG PO CP24
37.5000 mg | ORAL_CAPSULE | Freq: Every day | ORAL | Status: DC
  Administered 2016-12-21 – 2016-12-22 (×2): 37.5 mg via ORAL
  Filled 2016-12-21 (×2): qty 1

## 2016-12-21 MED ORDER — METHOCARBAMOL 500 MG PO TABS: 500.0000 mg | ORAL_TABLET | Freq: Three times a day (TID) | ORAL | 0 refills | Status: DC | PRN

## 2016-12-21 MED ORDER — ATORVASTATIN CALCIUM 40 MG PO TABS
40.0000 mg | ORAL_TABLET | Freq: Every day | ORAL | Status: DC
  Administered 2016-12-21: 40 mg via ORAL
  Filled 2016-12-21: qty 1

## 2016-12-21 MED ORDER — KETOROLAC TROMETHAMINE 30 MG/ML IJ SOLN
30.0000 mg | Freq: Once | INTRAMUSCULAR | Status: AC
  Administered 2016-12-21: 30 mg via INTRAVENOUS
  Filled 2016-12-21: qty 1

## 2016-12-21 NOTE — Anesthesia Postprocedure Evaluation (Signed)
Anesthesia Post Note  Patient: OPLE GIRGIS  Procedure(s) Performed: RIGHT LATISSIMUS FLAP TO BREAST (Right Back) REMOVAL OF BILATERAL TISSUE EXPANDERS WITH PLACEMENT OF BILATERAL SILICONE BREAST IMPLANTS, LIPOFILLING FROM ABDOMEN TO BILATERAL CHEST (Bilateral Breast) LIPOSUCTION WITH LIPOFILLING (N/A Abdomen)     Patient location during evaluation: Nursing Unit Anesthesia Type: General Level of consciousness: awake and alert, oriented and patient cooperative Pain management: pain level controlled Vital Signs Assessment: post-procedure vital signs reviewed and stable Respiratory status: spontaneous breathing, nonlabored ventilation and respiratory function stable Cardiovascular status: stable and blood pressure returned to baseline Postop Assessment: no apparent nausea or vomiting and adequate PO intake Anesthetic complications: no    Last Vitals:  Vitals:   12/21/16 0922 12/21/16 1341  BP: (!) 100/59 (!) 113/53  Pulse: 71 86  Resp: 18 18  Temp: 36.7 C 36.9 C  SpO2: 99% 97%    Last Pain:  Vitals:   12/21/16 1508  TempSrc:   PainSc: 7                  Aldene Hendon,E. Jamie-Lee Galdamez

## 2016-12-21 NOTE — Discharge Summary (Addendum)
Physician Discharge Summary  Patient ID: Melanie Barber MRN: [id number] DOB/AGE: [id number] 49 y.o.  Admit date: 12/20/2016 Discharge date: 12/22/2016  Admission Diagnoses: History breast cancer, acquired absence breasts, history therapeutic radiation  Discharge Diagnoses:  Active Problems:   History of breast cancer   Discharged Condition: stable  Hospital Course: Post operatively patient had pain over back mostly. Her discharge held until POD#2 due to pain control. She was ambulatory with minimal assist. She tolerated diet, had some nausea over POD#1 no emesis. Instructed on bathing and binders.   Treatments: surgery: removal bilateral tissue expanders placement implants lipofilling bilateral chest right latissimus flap  Discharge Exam: Blood pressure 109/64, pulse 83, temperature 98 F (36.7 C), resp. rate 17, height 5\' 7"  (1.702 m), weight 75.3 kg (166 lb), SpO2 99 %. Incision/Wound: incisions dry intact, chest soft with hyperemia right chest consistent with prior radiation, back flat, JPs serosanguinous  Disposition: Home  Discharge Instructions    Call MD for:  redness, tenderness, or signs of infection (pain, swelling, bleeding, redness, odor or green/yellow discharge around incision site)   Complete by:  As directed    Call MD for:  temperature >100.5   Complete by:  As directed    Discharge instructions   Complete by:  As directed    Ok to remove dressings and shower am 12.19.18. Soap and water ok, pat incisions dry. No creams or ointments over incisions. Do not let drains dangle in shower, attach to lanyard or similar.Strip and record drains twice daily and bring log to clinic visit.  Breast binder or soft compression bra, abdominal binder or compression garment all other times.  Ok to raise arms above shoulders for bathing and dressing.  No house yard work or exercise until cleared by MD.   Driving Restrictions   Complete by:  As directed    No driving for 1  week, then no driving if taking narcotics   Lifting restrictions   Complete by:  As directed    No lifting > 5 lbs   Resume previous diet   Complete by:  As directed      Allergies as of 12/21/2016      Reactions   Keflex [cephalexin] Anaphylaxis   Decadron [dexamethasone] Anxiety   Beyond hyper      Medication List    TAKE these medications   anastrozole 1 MG tablet Commonly known as:  ARIMIDEX Take 1 tablet (1 mg total) by mouth daily.   Biotin 10 MG Tabs Take 10 mg by mouth daily.   bisacodyl 5 MG EC tablet Commonly known as:  DULCOLAX Take 5 mg by mouth daily as needed for moderate constipation.   CALCIUM 600+D PO Take 1 tablet by mouth daily.   CULTURELLE Caps Take 1 capsule by mouth daily.   docusate sodium 100 MG capsule Commonly known as:  COLACE Take 200 mg by mouth daily as needed for mild constipation.   Investigational atorvastatin/placebo 40 MG tablet [facility] [id number] [phone] Take 1 tablet by mouth daily. Take 1 tablet daily with or without food.   Investigational palbociclib 125 MG capsule [place] AFT-05 [name] Commonly known as:  IBRANCE Take 1 capsule (125 mg total) by mouth daily. Take with food. Swallow whole. Do not chew. Take on days 1-21. Repeat every 28 days.   Melatonin 5 MG Caps Take 5 mg by mouth at bedtime as needed (sleep).   methocarbamol 500 MG tablet Commonly known as:  ROBAXIN Take 1 tablet (500 mg  total) by mouth every 8 (eight) hours as needed for muscle spasms.   multivitamin tablet Take 1 tablet by mouth daily.   naproxen sodium 220 MG tablet Commonly known as:  ALEVE Take 220-440 mg by mouth daily as needed (pain).   ondansetron 8 MG tablet Commonly known as:  ZOFRAN Take 0.5 tablets (4 mg total) every 8 (eight) hours as needed by mouth for nausea or vomiting. What changed:  how much to take   oxyCODONE 5 MG immediate release tablet Commonly known as:  Oxy IR/ROXICODONE Take 1-2 tablets (5-10 mg total) by  mouth every 4 (four) hours as needed for moderate pain.   sulfamethoxazole-trimethoprim 800-160 MG tablet Commonly known as:  BACTRIM DS,SEPTRA DS Take 1 tablet by mouth 2 (two) times daily.   venlafaxine XR 37.5 MG 24 hr capsule Commonly known as:  EFFEXOR-XR Take 37.5 mg by mouth daily with breakfast.      Follow-up Information    Irene Limbo, MD Follow up in 1 week(s).   Specialty:  Plastic Surgery Why:  as scheduled Contact information: [address] [place] [address] [phone]           Signed: Irene Limbo 12/21/2016, 6:53 AM

## 2016-12-21 NOTE — Progress Notes (Signed)
Patient reporting pain not controlled for discharge and will plan remain in hospital this evening.  Irene Limbo, MD [facility] Plastic & Reconstructive Surgery [phone], pin [phone]

## 2016-12-22 ENCOUNTER — Encounter (HOSPITAL_COMMUNITY): Payer: Self-pay | Admitting: Plastic Surgery

## 2016-12-22 MED ORDER — IBUPROFEN 400 MG PO TABS
400.0000 mg | ORAL_TABLET | Freq: Three times a day (TID) | ORAL | Status: DC
  Administered 2016-12-22: 400 mg via ORAL
  Filled 2016-12-22: qty 1

## 2016-12-22 MED ORDER — GABAPENTIN 300 MG PO CAPS: 300.0000 mg | ORAL_CAPSULE | Freq: Two times a day (BID) | ORAL | 0 refills | Status: DC

## 2016-12-22 MED FILL — GABAPENTIN 300 MG CAPSULE: 300 | 15 days supply | Qty: 30 | Fill #0

## 2016-12-22 MED FILL — METHOCARBAMOL 500 MG TABS: 500 | 10 days supply | Qty: 30 | Fill #0

## 2016-12-22 MED FILL — SULFAMETHOXAZOLE/TMP DS TAB: 800-160 | 5 days supply | Qty: 10 | Fill #0

## 2016-12-22 MED FILL — oxyCODONE HCL 5 MG TABS: 5 | 4 days supply | Qty: 40 | Fill #0

## 2016-12-22 NOTE — Progress Notes (Signed)
  Plastic Surgery  POD# 2 right latissimus flap, removal bilateral TE and placement implants, lipofilling  Patient discharge held due to pain control issues, largely over back. Received additional Toradol dose overnight.  Temp:  [98 F (36.7 C)-99.1 F (37.3 C)] 98.7 F (37.1 C) (12/19 0602) Pulse Rate:  [71-106] 106 (12/19 0602) Resp:  [17-18] 17 (12/19 0602) BP: (100-122)/(53-65) 118/60 (12/19 0602) SpO2:  [96 %-99 %] 96 % (12/19 0602)   PO 552 Drain back 190 right chest 95  PE Abdominal binder has slid up over breasts, removed this and can use her home compression garment at home Chest with dry incisions soft, some hyperemia right mastectomy flap consistent with prior radiation JPs serosanguinous Back flat   A/P More pain than expected, feels ready for home. Recommend Motrin as directed with meals, will add gabapentin to home meds. Home today. She will hold arimidex until her post op visit.  Irene Limbo, MD [facility] Plastic & Reconstructive Surgery [phone], pin [phone]

## 2016-12-22 NOTE — Progress Notes (Signed)
Discharge home. Home discharge instruction given, no question verbalized. 

## 2016-12-29 ENCOUNTER — Other Ambulatory Visit: Payer: Self-pay | Admitting: *Deleted

## 2016-12-29 DIAGNOSIS — C50911 Malignant neoplasm of unspecified site of right female breast: Secondary | ICD-10-CM | POA: Diagnosis not present

## 2016-12-29 DIAGNOSIS — Z170 Estrogen receptor positive status [ER+]: Principal | ICD-10-CM

## 2016-12-29 DIAGNOSIS — C50411 Malignant neoplasm of upper-outer quadrant of right female breast: Secondary | ICD-10-CM

## 2016-12-29 DIAGNOSIS — Z9012 Acquired absence of left breast and nipple: Secondary | ICD-10-CM | POA: Diagnosis not present

## 2016-12-30 ENCOUNTER — Encounter: Payer: Self-pay | Admitting: *Deleted

## 2016-12-30 ENCOUNTER — Other Ambulatory Visit: Payer: Self-pay | Admitting: *Deleted

## 2016-12-30 NOTE — Patient Outreach (Addendum)
[facility]) Care Management  12/30/2016  Melanie Barber December 14, 1965 [id number]   Subjective: Telephone call to patient's home number, spoke with patient, and HIPAA verified.  Discussed [facility] Care Management [facility] Transition of care follow up, patient voiced understanding, and is in agreement to follow up.   Patient states she remembers speaking with this RNCM in the past.  States she is doing much better, this is the hardest of all the surgeries she has had in the past, had a follow up appointment with surgeon on 12/29/16, appointment went well, drain pulled, and has another follow up appointment on 01/05/17 to assess other drain status.  States drain status and labs will determine when she can restart her chemo.  States she is planning to return to work on 01/11/17. Patient states she is able to manage self care and has assistance as needed.  Patient voices understanding of medical diagnosis, surgery, and treatment plan.  [facility] benefits discussed on 12/15/16 preoperative call and patient states no additional questions at this time.   Patient states she does not have any education material, transition of care, care coordination, disease management, disease monitoring, transportation, community resource, or pharmacy needs at this time.  States she is very appreciative of the follow up and is in agreement to receive [facility] Management information.    Objective:Per KPN (Knowledge Performance Now, point of care tool) and chart review,patient hospitalized 12/20/16 -12/22/16 forLATISSIMUS FLAP TO BREASTright,REMOVAL OF BILATERAL TISSUE EXPANDERS WITH PLACEMENT OF BILATERAL SILICONE BREAST IMPLANTS, LIPOFILLING FROM ABDOMEN TO BILATERAL CHEST, andLIPOSUCTION WITH L[facility]OFILLINGat [facility] hospital.     Assessment:Received [facility] Preoperative / Transition of care referral on 11/29/16. Preoperative call completed, and Transition of care follow up completed, no care management needs, and  will proceed with case closure.      Plan:RNCM will send patient successful outreach letter, [facility] pamphlet, and magnet. RNCM will send case closure due to follow up completed / no care management needs request to [facility].      [name] Friendly, BSN, [place] Management [facility] Telephonic CM Phone: [phone] Fax: [phone]

## 2017-01-04 NOTE — Assessment & Plan Note (Deleted)
Bilateral mastectomies 10/01/2015 With reconstruction Right mastectomy: IDC grade 2, multifocal largest 2.5 cm, with high-grade DCIS, broadly present at anterior margin and inferior margin 2/5 LN positive, LVI Present,  Left mastectomy: ALH, ER 95%, PR 5-65%, HER-2 negative, Ki-67 10-20%,  Pathologic stage: T2 N1 a stage IIB Mammaprint: High risk CT-CAP and bone scan: No evidence of metastatic disease ----------------------------------------------------------------------------------------------------------------------------------------------------------------- Treatment summary: 1. Adjuvant chemotherapy with dose dense Adriamycin and Cytoxan 4 followed by Abraxane weekly 12 started 11/05/2015 completed 03/17/2016 3. Followed by adjuvant radiation therapy Completed 06/08/2016 4. Followed by adjuvant antiestrogen therapy with anastrozole 7years  PREVENT trial: [id number] 98213  No toxicities to Study Drug -------------------------------------------------------------------------------------------------------------------------------------------------------- Anastrozole Toxicities: No hot flashes or myalgias. Patient is tolerating anastrozole extremely well.  [name] clinical trial:Patient has been randomized to Palbociclib Palbociclib toxicities: 1. Fatigue grade 1  Return to clinic in 3 months for follow-up

## 2017-01-05 ENCOUNTER — Telehealth: Payer: Self-pay

## 2017-01-05 ENCOUNTER — Other Ambulatory Visit: Payer: 59

## 2017-01-05 ENCOUNTER — Telehealth: Payer: Self-pay | Admitting: *Deleted

## 2017-01-05 ENCOUNTER — Ambulatory Visit: Payer: 59 | Admitting: Hematology and Oncology

## 2017-01-05 NOTE — Telephone Encounter (Signed)
01/05/17 at 4:55pm - [name] study follow up-  The pt called the research nurse earlier today and stated that she was not comfortable re-starting her study drug today.  The pt was scheduled to see Dr. Lindi Adie this afternoon.  She said that her last breast drain was "just pulled on Monday".  She said that there is some concern that she may develop seromas which may need to be drained.  The pt requested that she restart her IP (palbociclib) on 01/20/17. Dr. Lindi Adie was fine with this plan.  He asked the research nurse to check with the study to ensure that 01/20/17 was still within parameters with the study.  He also stated that the pt should resume her anti-hormonal therapy (anastrozole) today.  The research nurse called and told the pt that she should resume her anastrozole today. The research nurse also emailed the [place] study team to see if the pt can be off study drug until 01/20/17.   Brion Aliment RN, BSN, CCRP Clinical Research Nurse 01/05/2017 5:04 PM

## 2017-01-05 NOTE — Telephone Encounter (Signed)
Per Lexine Baton cancel today's lab and Dr. Lindi Adie appt and r/s to 01/20/17 2:15/2:45.  Also cancel 01/10/17 appts.  Lexine Baton will speak with Dr.Gudena and advise the patient. Webb Silversmith

## 2017-01-07 ENCOUNTER — Other Ambulatory Visit: Payer: Self-pay | Admitting: *Deleted

## 2017-01-07 DIAGNOSIS — C50411 Malignant neoplasm of upper-outer quadrant of right female breast: Secondary | ICD-10-CM

## 2017-01-07 DIAGNOSIS — Z170 Estrogen receptor positive status [ER+]: Principal | ICD-10-CM

## 2017-01-10 ENCOUNTER — Ambulatory Visit: Payer: 59 | Admitting: Hematology and Oncology

## 2017-01-10 ENCOUNTER — Other Ambulatory Visit: Payer: 59

## 2017-01-12 MED FILL — ANASTROZOLE 1 MG TABLET: 1 | 90 days supply | Qty: 90 | Fill #2

## 2017-01-19 NOTE — Assessment & Plan Note (Signed)
Bilateral mastectomies 10/01/2015 With reconstruction Right mastectomy: IDC grade 2, multifocal largest 2.5 cm, with high-grade DCIS, broadly present at anterior margin and inferior margin 2/5 LN positive, LVI Present,  Left mastectomy: ALH, ER 95%, PR 5-65%, HER-2 negative, Ki-67 10-20%,  Pathologic stage: T2 N1 a stage IIB Mammaprint: High risk CT-CAP and bone scan: No evidence of metastatic disease ----------------------------------------------------------------------------------------------------------------------------------------------------------------- Treatment summary: 1. Adjuvant chemotherapy with dose dense Adriamycin and Cytoxan 4 followed by Abraxane weekly 12 started 11/05/2015 completed 03/17/2016 3. Followed by adjuvant radiation therapy Completed 06/08/2016 4. Followed by adjuvant antiestrogen therapy with anastrozole 7years  PREVENT trial: [id number] 98213  No toxicities to Study Drug -------------------------------------------------------------------------------------------------------------------------------------------------------- Anastrozole Toxicities: No hot flashes or myalgias. Patient is tolerating anastrozole extremely well.  [name] clinical trial:Patient has been randomized to Palbociclib  Palbociclib toxicities: 1. Fatigue grade 1 2. Neutropenia: Today's ANC is 1.1.  She has adequate counts and will continue Palbociclib  Return to clinic in 3 months for follow-up and labs

## 2017-01-20 ENCOUNTER — Encounter: Payer: Self-pay | Admitting: *Deleted

## 2017-01-20 ENCOUNTER — Inpatient Hospital Stay: Payer: 59 | Attending: Hematology and Oncology

## 2017-01-20 ENCOUNTER — Inpatient Hospital Stay: Payer: 59 | Admitting: Hematology and Oncology

## 2017-01-20 ENCOUNTER — Telehealth: Payer: Self-pay | Admitting: Hematology and Oncology

## 2017-01-20 DIAGNOSIS — Z006 Encounter for examination for normal comparison and control in clinical research program: Secondary | ICD-10-CM | POA: Diagnosis not present

## 2017-01-20 DIAGNOSIS — Z90710 Acquired absence of both cervix and uterus: Secondary | ICD-10-CM | POA: Diagnosis not present

## 2017-01-20 DIAGNOSIS — Z170 Estrogen receptor positive status [ER+]: Secondary | ICD-10-CM

## 2017-01-20 DIAGNOSIS — C50411 Malignant neoplasm of upper-outer quadrant of right female breast: Secondary | ICD-10-CM

## 2017-01-20 DIAGNOSIS — Z9221 Personal history of antineoplastic chemotherapy: Secondary | ICD-10-CM

## 2017-01-20 DIAGNOSIS — Z9013 Acquired absence of bilateral breasts and nipples: Secondary | ICD-10-CM | POA: Insufficient documentation

## 2017-01-20 DIAGNOSIS — Z923 Personal history of irradiation: Secondary | ICD-10-CM

## 2017-01-20 DIAGNOSIS — Z79811 Long term (current) use of aromatase inhibitors: Secondary | ICD-10-CM | POA: Insufficient documentation

## 2017-01-20 LAB — CBC WITH DIFFERENTIAL/PLATELET
Basophils Absolute: 0.1 10*3/uL (ref 0.0–0.1)
Basophils Relative: 1 %
EOS PCT: 5 %
Eosinophils Absolute: 0.2 10*3/uL (ref 0.0–0.5)
HEMATOCRIT: 36.8 % (ref 34.8–46.6)
Hemoglobin: 12.5 g/dL (ref 11.6–15.9)
LYMPHS ABS: 0.9 10*3/uL (ref 0.9–3.3)
LYMPHS PCT: 18 %
MCH: 33.6 pg (ref 25.1–34.0)
MCHC: 33.9 g/dL (ref 31.5–36.0)
MCV: 98.9 fL (ref 79.5–101.0)
Monocytes Absolute: 0.4 10*3/uL (ref 0.1–0.9)
Monocytes Relative: 8 %
NEUTROS ABS: 3.2 10*3/uL (ref 1.5–6.5)
Neutrophils Relative %: 68 %
PLATELETS: 206 10*3/uL (ref 145–400)
RBC: 3.72 MIL/uL (ref 3.70–5.45)
RDW: 14.1 % (ref 11.2–16.1)
WBC: 4.8 10*3/uL (ref 3.9–10.3)

## 2017-01-20 LAB — HEMOGLOBIN A1C
Hgb A1c MFr Bld: 4.7 % — ABNORMAL LOW (ref 4.8–5.6)
MEAN PLASMA GLUCOSE: 88.19 mg/dL

## 2017-01-20 LAB — COMPREHENSIVE METABOLIC PANEL
ALBUMIN: 4.3 g/dL (ref 3.5–5.0)
ALT: 20 U/L (ref 0–55)
ANION GAP: 10 (ref 3–11)
AST: 22 U/L (ref 5–34)
Alkaline Phosphatase: 103 U/L (ref 40–150)
BUN: 13 mg/dL (ref 7–26)
CHLORIDE: 103 mmol/L (ref 98–109)
CO2: 27 mmol/L (ref 22–29)
Calcium: 9.7 mg/dL (ref 8.4–10.4)
Creatinine, Ser: 0.79 mg/dL (ref 0.60–1.10)
GFR calc Af Amer: >60 mL/min (ref >60)
GFR calc non Af Amer: >60 mL/min (ref >60)
GLUCOSE: 91 mg/dL (ref 70–140)
POTASSIUM: 3.9 mmol/L (ref 3.3–4.7)
SODIUM: 140 mmol/L (ref 136–145)
Total Bilirubin: 0.9 mg/dL (ref 0.2–1.2)
Total Protein: 7.5 g/dL (ref 6.4–8.3)

## 2017-01-20 LAB — RESEARCH LABS

## 2017-01-20 MED ORDER — VENLAFAXINE HCL ER 37.5 MG PO CP24: 37.5000 mg | ORAL_CAPSULE | Freq: Every day | ORAL | 6 refills | Status: DC

## 2017-01-20 MED ORDER — INV-PALBOCICLIB 125 MG CAPS #23 ALLIANCE FOUNDATION AFT-05 (PALLAS): 125.0000 mg | ORAL_CAPSULE | Freq: Every day | ORAL | 0 refills | Status: DC

## 2017-01-20 MED FILL — VENLAFAXINE HCL ER 37.5 MG: 37.5 | 30 days supply | Qty: 30 | Fill #0 | Status: TO

## 2017-01-20 NOTE — Telephone Encounter (Signed)
Gave patient avs and calendar with appts per 1/17 los.  °

## 2017-01-20 NOTE — Progress Notes (Signed)
Patient Care Team: Fanny Bien, MD as PCP - General (Family Medicine) Rolm Bookbinder, MD as Consulting Physician (General Surgery) Nicholas Lose, MD as Consulting Physician (Hematology and Oncology) Delice Bison Charlestine Massed, NP as Nurse Practitioner (Hematology and Oncology) Kyung Rudd, MD as Consulting Physician (Radiation Oncology)  DIAGNOSIS:  Encounter Diagnosis  Name Primary?  . Malignant neoplasm of upper-outer quadrant of right breast in female, estrogen receptor positive ([name])     SUMMARY OF ONCOLOGIC HISTORY:   Breast cancer of upper-outer quadrant of right female breast ([place])   09/02/2015 Mammogram    Right breast UOQ: 2 masses 3 cm apart, U/S they measured 3.4 cm, larger mass at 1.6 cm, in addition, 1 cm mass at 9:00 and 0.9 cm mass at 8:30( could be intramammary LN); right axillary LN 11 mm; microcalcs 10 cm span       09/09/2015 Initial Diagnosis    Right breast biopsy OUQ: DCIS with calcs and necrosis ER 95%, PR 80%; 9:30 position 5cmfn: IDC with DCIS ER 95%, PR 65%, Ki-67 20%,; right biopsy 8:30 position 3cmfn ER 95%, PR 5%, Ki-67 10%: intramammary LN with IDC      09/12/2015 Breast MRI    Multicentric right breast cancer cluster of enhancing masses 4.1 x 2.2 x 2.6 cm, LOQ: 1.1 cm enhancing mass previously biopsied, subcutaneous low right breast 2 cm mass not biopsied, multiple other small enhancing masses, 1 cm right axillary lymph node      10/01/2015 Surgery    Right mastectomy: IDC grade 2, multifocal largest 2.5 cm, with high-grade DCIS, broadly present at  anterior margin and inferior margin 2/5 LN positive, LVI Present,  Left mastectomy: ALH, ER 95%, PR 5-65%, HER-2 negative, Ki-67 10-20%, T2 N1 a stage IIB      11/05/2015 - 03/17/2016 Chemotherapy    Adjuvant chemotherapy with dose dense Adriamycin and Cytoxan 4 followed by Abraxane weekly 12      04/22/2016 - 06/08/2016 Radiation Therapy    Adjuvant radiation therapy      06/22/2016 Surgery   Hysterectomy with bilateral salpingo-oophorectomy: Negative for malignancy      06/30/2016 -  Anti-estrogen oral therapy    Anastrozole 1 mg daily, patient is on a clinical trial [name] ( randomized to Palbociclib)       CHIEF COMPLIANT: Follow-up on clinical trial [name], receiving palbociclib  INTERVAL HISTORY: Melanie Barber is a 52 year old with above-mentioned history of right breast cancer currently on adjuvant therapy with anastrozole along with Palbociclib being given on a clinical trial [name].  She was off treatment for surgical reconstruction is here to resume her treatment.  REVIEW OF SYSTEMS:   Constitutional: Denies fevers, chills or abnormal weight loss Eyes: Denies blurriness of vision Ears, nose, mouth, throat, and face: Denies mucositis or sore throat Respiratory: Denies cough, dyspnea or wheezes Cardiovascular: Denies palpitation, chest discomfort Gastrointestinal:  Denies nausea, heartburn or change in bowel habits Skin: Denies abnormal skin rashes Lymphatics: Denies new lymphadenopathy or easy bruising Neurological:Denies numbness, tingling or new weaknesses Behavioral/Psych: Mood is stable, no new changes  Extremities: No lower extremity edema Breast: Breast reconstruction All other systems were reviewed with the patient and are negative.  I have reviewed the past medical history, past surgical history, social history and family history with the patient and they are unchanged from previous note.  ALLERGIES:  is allergic to keflex [cephalexin] and decadron [dexamethasone].  MEDICATIONS:  Current Outpatient Medications  Medication Sig Dispense Refill  . anastrozole (ARIMIDEX) 1 MG tablet  Take 1 tablet (1 mg total) by mouth daily. (Patient not taking: Reported on 12/30/2016) 90 tablet 3  . Atorvastatin Calcium (INVESTIGATIONAL ATORVASTATIN/PLACEBO) 40 MG tablet [facility] [id number] Take 1 tablet by mouth daily. Take 1 tablet daily with or without food. 180  tablet 0  . Biotin 10 MG TABS Take 10 mg by mouth daily.    . bisacodyl (DULCOLAX) 5 MG EC tablet Take 5 mg by mouth daily as needed for moderate constipation.    . Calcium Carbonate-Vitamin D (CALCIUM 600+D PO) Take 1 tablet by mouth daily.    Marland Kitchen docusate sodium (COLACE) 100 MG capsule Take 200 mg by mouth daily as needed for mild constipation.    . Investigational palbociclib (IBRANCE) 125 MG capsule [facility] AFT-05 [name] Take 1 capsule (125 mg total) by mouth daily. Take with food. Swallow whole. Do not chew. Take on days 1-21. Repeat every 28 days. (Patient not taking: Reported on 12/30/2016) 69 capsule 0  . Lactobacillus Rhamnosus, GG, (CULTURELLE) CAPS Take 1 capsule by mouth daily.    . Melatonin 5 MG CAPS Take 5 mg by mouth at bedtime as needed (sleep).    . Multiple Vitamin (MULTIVITAMIN) tablet Take 1 tablet by mouth daily.    . naproxen sodium (ANAPROX) 220 MG tablet Take 220-440 mg by mouth daily as needed (pain).     . ondansetron (ZOFRAN) 8 MG tablet Take 0.5 tablets (4 mg total) every 8 (eight) hours as needed by mouth for nausea or vomiting. (Patient taking differently: Take 4-8 mg by mouth every 8 (eight) hours as needed for nausea or vomiting. ) 30 tablet 0  . venlafaxine XR (EFFEXOR-XR) 37.5 MG 24 hr capsule Take 1 capsule (37.5 mg total) by mouth daily with breakfast. 30 capsule 6   No current facility-administered medications for this visit.     PHYSICAL EXAMINATION: ECOG PERFORMANCE STATUS: 1 - Symptomatic but completely ambulatory  Vitals:   01/20/17 1515  BP: 121/72  Pulse: 86  Resp: 18  Temp: (!) 97.4 F (36.3 C)  SpO2: 97%   Filed Weights   01/20/17 1515  Weight: 170 lb 14.4 oz (77.5 kg)    GENERAL:alert, no distress and comfortable SKIN: skin color, texture, turgor are normal, no rashes or significant lesions EYES: normal, Conjunctiva are pink and non-injected, sclera clear OROPHARYNX:no exudate, no erythema and lips, buccal mucosa, and tongue  normal  NECK: supple, thyroid normal size, non-tender, without nodularity LYMPH:  no palpable lymphadenopathy in the cervical, axillary or inguinal LUNGS: clear to auscultation and percussion with normal breathing effort HEART: regular rate & rhythm and no murmurs and no lower extremity edema ABDOMEN:abdomen soft, non-tender and normal bowel sounds MUSCULOSKELETAL:no cyanosis of digits and no clubbing  NEURO: alert & oriented x 3 with fluent speech, no focal motor/sensory deficits EXTREMITIES: No lower extremity edema  LABORATORY DATA:  I have reviewed the data as listed CMP Latest Ref Rng & Units 01/20/2017 12/15/2016 10/22/2016  Glucose 70 - 140 mg/dL 91 92 76  BUN 7 - 26 mg/dL 13 12 21.4  Creatinine 0.60 - 1.10 mg/dL 0.79 0.68 0.7  Sodium 136 - 145 mmol/L 140 139 140  Potassium 3.3 - 4.7 mmol/L 3.9 3.8 4.1  Chloride 98 - 109 mmol/L 103 105 -  CO2 22 - 29 mmol/L '27 25 26  '$ Calcium 8.4 - 10.4 mg/dL 9.7 9.3 9.1  Total Protein 6.4 - 8.3 g/dL 7.5 - 6.7  Total Bilirubin 0.2 - 1.2 mg/dL 0.9 - 0.64  Alkaline Phos 40 - 150 U/L 103 - 68  AST 5 - 34 U/L 22 - 21  ALT 0 - 55 U/L 20 - 20    Lab Results  Component Value Date   WBC 4.8 01/20/2017   HGB 12.5 01/20/2017   HCT 36.8 01/20/2017   MCV 98.9 01/20/2017   PLT 206 01/20/2017   NEUTROABS 3.2 01/20/2017    ASSESSMENT & PLAN:  Breast cancer of upper-outer quadrant of right female breast ([place]) Bilateral mastectomies 10/01/2015 With reconstruction Right mastectomy: IDC grade 2, multifocal largest 2.5 cm, with high-grade DCIS, broadly present at anterior margin and inferior margin 2/5 LN positive, LVI Present,  Left mastectomy: ALH, ER 95%, PR 5-65%, HER-2 negative, Ki-67 10-20%,  Pathologic stage: T2 N1 a stage IIB Mammaprint: High risk CT-CAP and bone scan: No evidence of metastatic  disease ----------------------------------------------------------------------------------------------------------------------------------------------------------------- Treatment summary: 1. Adjuvant chemotherapy with dose dense Adriamycin and Cytoxan 4 followed by Abraxane weekly 12 started 11/05/2015 completed 03/17/2016 3. Followed by adjuvant radiation therapy Completed 06/08/2016 4. Followed by adjuvant antiestrogen therapy with anastrozole 7years  PREVENT trial: [id number] 98213  No toxicities to Study Drug; patient will need to be set up for another cardiac MRI since the expanders have been removed -------------------------------------------------------------------------------------------------------------------------------------------------------- Anastrozole Toxicities: No hot flashes or myalgias. Patient is tolerating anastrozole extremely well.  [name] clinical trial:Patient has been randomized to Palbociclib She will resume palbociclib today.  It was held previously for 28 days before her breast reconstruction  Palbociclib toxicities: 1. Fatigue grade 1 2. Neutropenia: Today's ANC is 3.2  Return to clinic in 3 months for follow-up and labs  I spent 25 minutes talking to the patient of which more than half was spent in counseling and coordination of care.  No orders of the defined types were placed in this encounter.  The patient has a good understanding of the overall plan. she agrees with it. she will call with any problems that may develop before the next visit here.   Harriette Ohara, MD 01/20/17

## 2017-01-20 NOTE — Progress Notes (Signed)
01/20/17 at 4:24pm - [name]/AFT-05- cycle 6, day 1 study notes-The pt was into the cancer center this afternoon for her cycle 6, day 1 assessments.  The pt was given her questionnaires/PRO's upon arrival to the clinic. The research nurse reviewed the pt's completed questionnaires for completeness and accuracy.  The pt returned her completed cycle 4 and 5 drug diaries for her palbociclib and anastrozole daily dosing (pt previously mailed in her cycle 3 diaries to the research nurse).  The pt also returned her empty cycle 3 and cycle 4 study drug bottles for the study drug accountability check.  The pt's cycle 5 was full with the seal intact because the missed the entire cycle 5 due to her breast surgery.  The study team confirmed that the pt would need to start cycle 6 today because the pt missed the entire 28 day cycle for cycle 5 due to her planned surgery.  The pt's labs were drawn, and Dr. Lindi Adie reviewed.  The pt's labs were all acceptable to proceed with the pt's cycle 6 today.  The pt was seen and examined by Dr. Lindi Adie.  The pt reports some postoperative pain from her December surgery that has resolved.    She reported that her Effexor 37.5 mg has helped her with her hot flashes.  The pt stated the following AE's as ongoing:  fatigue, arthralgia, hot flashes, and nausea.   The pt said that her vaginal bleeding (hemorrhage) resolved "2 months" after it started (end date 11/24/16).  The pt also stated that it took "1 month" to fully resolve her UTI (end date 12/08/16).  The pt's medications were reviewed.  The pt is very eager to resume her study drug today at full dose.  The research nurse completed the IRT transaction for the pt's new study drug bottles.  The pt was dispensed her study drug bottles for cycles 6-8.  The bottles assigned were [id number], [id number], [id number].  The research nurse also gave the pt her new study drug diaries for cycles 6-8.  The pt's next appointment was scheduled for 04/12/17.   The pt was  thanked for her support and compliance with this clinical trial. Brion Aliment RN, BSN, [place] Nurse 01/20/2017 4:33 PM   [name] [id number]  01/20/2017  Adverse Event Log  Study/Protocol: [name]/AFT-05 Cycle: 3 AE's through 01/20/17  Event Grade Onset  Date Resolved Date Attribution to Palbociclib Attribution to anastrozole Treatment Comments  Post-op Breast pain 1 12/20/16 01/20/17 No No Palbociclib +Endocrine therapy pt skipped cycle 5 due to surgery  White blood cell decreased 3 10/22/16 01/20/17 Yes  No Palbociclib + Endocrine therapy Not clinically significant  Neutrophil count decreased 2 10/22/16 01/20/17 Yes  No Palbociclib + Endocrine therapy Not clinically significant  Fatigue 1 09/16/16 Ongoing Yes  Yes Palbociclib + Endocrine  able to work  Platelet count decreased 1 10/22/16 01/20/17 Yes No Palbociclib + Endocrine therapy Not clinically significant  Vaginal hemorrhage 1 09/24/16 11/24/16  No No Palbociclib +Endocrine therapy No intervention  Hot flashes 2 10/22/16 Ongoing  No  Yes Palbociclib + Endocrine therapy Pt started Effexor 37.5 mg   Arthralgia  1 10/08/16 Ongoing No  Yes Palbociclib + Endocrine therapy   Infection(UTI) 2 11/08/16 12/08/16 Yes  No  Palbociclib +Endocrine theapy Antibiotics given   Nausea  1 11/01/16 ongoing Yes  No Palbociclib +Endocrine theapy   Pt takes Zofran for AE

## 2017-01-20 NOTE — Progress Notes (Signed)
01/20/17 at 4:38pm - PREVENT- month 14 study notes - The pt was in to see Dr. Lindi Adie for her routine follow up appointment.  The pt returned her completed monthly patient medication diaries for November and December 2018. The pt was thanked for compliance regarding her daily dosing of study drug.  The pt also recently had her planned breast reconstruction surgery.  The pt stated that she took her medication during her hospitalization.  The research nurse asked the pt if the pharmacy dispensed her study drug.  The pt said that she was just given "Lipitor" in the hospital.  The pt was reminded that her study drug is atorvastatin/placebo.  The pt said that she did not bring her study drug bottle to the hospital. The pt was informed that she must have received open label atorvastatin .  The pt was informed that she must only take pills from her study supplied study drug bottle.  The pt verbalized understanding.  The pt said that she is still sore from her surgery, and she requested that her PREVENT cardiac MRI be scheduled for 02/22/17.  This will be the pt's first cardiac MRI since she was enrolled with breast expanders, and the expanders were removed on 12/20/16.  The research nurse will contact Dr. Danny Lawless, the study PI, about the pt's late cardiac MRI.  Dr. Lindi Adie stated the pt has no toxicities to the PREVENT study drug.   Brion Aliment RN, BSN, CCRP Clinical Research Nurse 01/20/2017 4:44 PM   01/11/17 at 11:53am - The research nurse reviewed the pt's hospital medications,and the nurse confirmed that the pt did received 1 dose of open label atorvastatin during her hospitalization.  The research nurse informed Kenton Kingfisher, Geophysicist/field seismologist, and Jerline Pain, regulatory coordinator, about the deviation.  The research nurse completed the IRB deviation form.  Dr. Danny Lawless was also notified regarding the incident. Dr. Danny Lawless stated that it was acceptable to proceed with her first cardiac MRI in February 2019.    Brion Aliment RN, BSN, CCRP  Clinical Research Nurse 01/21/2017 12:00 PM

## 2017-01-25 ENCOUNTER — Other Ambulatory Visit: Payer: Self-pay | Admitting: Hematology and Oncology

## 2017-01-25 DIAGNOSIS — Z006 Encounter for examination for normal comparison and control in clinical research program: Secondary | ICD-10-CM

## 2017-02-17 MED FILL — VENLAFAXINE HCL ER 37.5 MG: 37.5 | 30 days supply | Qty: 30 | Fill #0

## 2017-02-22 ENCOUNTER — Ambulatory Visit (HOSPITAL_COMMUNITY)
Admission: RE | Admit: 2017-02-22 | Discharge: 2017-02-22 | Disposition: A | Discharge status: Home / Self Care | Payer: 59 | Source: Ambulatory Visit | Attending: Hematology and Oncology | Admitting: Hematology and Oncology

## 2017-02-22 ENCOUNTER — Ambulatory Visit (HOSPITAL_COMMUNITY): Admission: RE | Admit: 2017-02-22 | Payer: 59 | Source: Ambulatory Visit

## 2017-02-22 DIAGNOSIS — Z006 Encounter for examination for normal comparison and control in clinical research program: Secondary | ICD-10-CM

## 2017-03-08 ENCOUNTER — Other Ambulatory Visit: Payer: Self-pay | Admitting: *Deleted

## 2017-03-08 MED ORDER — VENLAFAXINE HCL ER 75 MG PO CP24: 75.0000 mg | ORAL_CAPSULE | Freq: Every day | ORAL | 6 refills | Status: DC

## 2017-03-08 MED FILL — VENLAFAXINE HCL ER 75 MG CA: 75 | 30 days supply | Qty: 30 | Fill #0

## 2017-03-14 ENCOUNTER — Telehealth: Payer: Self-pay | Admitting: *Deleted

## 2017-03-14 NOTE — Telephone Encounter (Signed)
03/14/17 at 11:02am- PREVENT- The research nurse received the pt's [facility] MRI Clinical report results via email from [facility], Government social research officer, this morning.  Dr. Lindi Adie reviewed the pt's results which was negative for alert findings.  The research nurse then contacted the pt and informed her that her latest cardiac MRI was "negative for alert findings".  The pt was informed that this was not a full clinical exam and her MRI only evaluated part of her heart.  The pt was told that a negative study indicates that no immediate life threatening condition related to cardiac contractility was identified.  The pt thanked the research nurse for letting her know the good results.  The pt said that she was doing well.   She said that she was in [place], [place] to go zip-lining today.  The nurse told the pt to call if she has any questions/concerns.   Brion Aliment RN, BSN, CCRP Clinical Research Nurse 03/14/2017 11:09 AM

## 2017-03-21 ENCOUNTER — Ambulatory Visit (HOSPITAL_COMMUNITY)
Admission: EM | Admit: 2017-03-21 | Discharge: 2017-03-21 | Disposition: A | Discharge status: Home / Self Care | Payer: 59 | Attending: Internal Medicine | Admitting: Internal Medicine

## 2017-03-21 ENCOUNTER — Encounter (HOSPITAL_COMMUNITY): Payer: Self-pay | Admitting: Emergency Medicine

## 2017-03-21 DIAGNOSIS — N3090 Cystitis, unspecified without hematuria: Secondary | ICD-10-CM | POA: Diagnosis not present

## 2017-03-21 DIAGNOSIS — M25552 Pain in left hip: Secondary | ICD-10-CM

## 2017-03-21 DIAGNOSIS — S7012XA Contusion of left thigh, initial encounter: Secondary | ICD-10-CM

## 2017-03-21 LAB — POCT URINALYSIS DIP (DEVICE)
BILIRUBIN URINE: NEGATIVE
Glucose, UA: NEGATIVE mg/dL
Hgb urine dipstick: NEGATIVE
Ketones, ur: NEGATIVE mg/dL
NITRITE: POSITIVE — AB
PH: 6.5 (ref 5.0–8.0)
Protein, ur: NEGATIVE mg/dL
SPECIFIC GRAVITY, URINE: 1.025 (ref 1.005–1.030)
Urobilinogen, UA: 1 mg/dL (ref 0.0–1.0)

## 2017-03-21 MED ORDER — SULFAMETHOXAZOLE-TRIMETHOPRIM 800-160 MG PO TABS: 1.0000 | ORAL_TABLET | Freq: Two times a day (BID) | ORAL | 0 refills | Status: DC

## 2017-03-21 NOTE — ED Provider Notes (Signed)
MRN: [id number] DOB: 1965-06-15  Subjective:   Melanie Barber is a 52 y.o. female presenting for 1 day history of cloudy malordorous urine. Hydrates very well. Denies fever, dysuria, hematuria, urinary frequency, urinary urgency, flank pain, abdominal pain, pelvic pain, genital rash and vaginal discharge, nausea and vomiting. She is currently undergoing treatment for cancer with Ibrance. She also reports suffering a fall ~1 week ago, landed on her left hip and is worried about a hematoma. Admits having a large bruise that has improved over time. Denies difficulty with ambulation, bony deformity, worsening swelling.   No current facility-administered medications for this encounter.   Current Outpatient Medications:  .  Investigational palbociclib (IBRANCE) 125 MG capsule [facility] AFT-05 [name], Take 1 capsule (125 mg total) by mouth daily. Take with food. Swallow whole. Do not chew. Take on days 1-21. Repeat every 28 days., Disp: 69 capsule, Rfl: 0 .  Melatonin 5 MG CAPS, Take 5 mg by mouth at bedtime as needed (sleep)., Disp: , Rfl:  .  naproxen sodium (ANAPROX) 220 MG tablet, Take 220-440 mg by mouth daily as needed (pain). , Disp: , Rfl:  .  venlafaxine XR (EFFEXOR-XR) 75 MG 24 hr capsule, Take 1 capsule (75 mg total) by mouth daily with breakfast., Disp: 30 capsule, Rfl: 6 .  anastrozole (ARIMIDEX) 1 MG tablet, Take 1 tablet (1 mg total) by mouth daily. (Patient not taking: Reported on 12/30/2016), Disp: 90 tablet, Rfl: 3 .  Atorvastatin Calcium (INVESTIGATIONAL ATORVASTATIN/PLACEBO) 40 MG tablet [facility] WF [id number], Take 1 tablet by mouth daily. Take 1 tablet daily with or without food., Disp: 180 tablet, Rfl: 0 .  Biotin 10 MG TABS, Take 10 mg by mouth daily., Disp: , Rfl:  .  bisacodyl (DULCOLAX) 5 MG EC tablet, Take 5 mg by mouth daily as needed for moderate constipation., Disp: , Rfl:  .  Calcium Carbonate-Vitamin D (CALCIUM 600+D PO), Take 1 tablet by mouth daily., Disp: ,  Rfl:  .  docusate sodium (COLACE) 100 MG capsule, Take 200 mg by mouth daily as needed for mild constipation., Disp: , Rfl:  .  Lactobacillus Rhamnosus, GG, (CULTURELLE) CAPS, Take 1 capsule by mouth daily., Disp: , Rfl:  .  Multiple Vitamin (MULTIVITAMIN) tablet, Take 1 tablet by mouth daily., Disp: , Rfl:  .  ondansetron (ZOFRAN) 8 MG tablet, Take 0.5 tablets (4 mg total) every 8 (eight) hours as needed by mouth for nausea or vomiting. (Patient taking differently: Take 4-8 mg by mouth every 8 (eight) hours as needed for nausea or vomiting. ), Disp: 30 tablet, Rfl: 0   Corena is allergic to keflex [cephalexin] and decadron [dexamethasone]. [name]  has a past medical history of Arthritis, Cancer of right breast ([place]), Depression, PONV (postoperative nausea and vomiting), and Sciatic leg pain. Also  has a past surgical history that includes Nasal/sinus endoscopy (Bilateral, 2005); Tonsillectomy (1977); Cervix surgery (1993); Wisdom tooth extraction; Breast reconstruction with placement of tissue expander and flex hd (acellular hydrated dermis) (Bilateral, 10/01/2015); Toe Surgery (Left, 1990); Nipple sparing mastectomy/sentinal lymph node biopsy/reconstruction/placement of tissue expander (Bilateral, 10/01/2015); Portacath placement (Right, 10/01/2015); Ultrasound guidance for vascular access (Right, 10/01/2015); Breast reconstruction with placement of tissue expander and flex hd (acellular hydrated dermis) (Bilateral, 10/01/2015); Port-a-cath removal (Right, 04/01/2016); Robotic assisted total hysterectomy with bilateral salpingo oophorectomy (Bilateral, 06/22/2016); Mastectomy (Left, 10/01/2015); Mastectomy complete / simple w/ sentinel node biopsy (Right, 10/01/2015); Latissimus flap to breast (Right, 12/20/2016); Removal of bilateral tissue expanders with placement of bilateral breast implants (  Bilateral, 12/20/2016); Breast biopsy (Right); Breast cyst aspiration (Right); Abdominal hysterectomy; Latissimus flap  to breast (Right, 12/20/2016); Removal of bilateral tissue expanders with placement of bilateral breast implants (Bilateral, 12/20/2016); and Liposuction with lipofilling (N/A, 12/20/2016).  Objective:   Vitals: BP 113/73   Pulse 82   Temp 97.7 F (36.5 C)   Resp 16   SpO2 100%   Physical Exam  Constitutional: She is oriented to person, place, and time. She appears well-developed and well-nourished.  HENT:  Mouth/Throat: Oropharynx is clear and moist.  Eyes: No scleral icterus.  Cardiovascular: Normal rate, regular rhythm and intact distal pulses. Exam reveals no gallop and no friction rub.  No murmur heard. Pulmonary/Chest: No respiratory distress. She has no wheezes. She has no rales.  Abdominal: Soft. Bowel sounds are normal. She exhibits no distension and no mass. There is no tenderness. There is no guarding.  No CVA tenderness.  Musculoskeletal:       Left upper leg: She exhibits tenderness (mild with associated ecchymosis over area depicted) and swelling (trace). She exhibits no bony tenderness, no edema, no deformity and no laceration.       Legs: Neurological: She is alert and oriented to person, place, and time.  Skin: Skin is warm and dry.  Psychiatric: She has a normal mood and affect.   Results for orders placed or performed during the hospital encounter of 03/21/17 (from the past 24 hour(s))  POCT urinalysis dip (device)     Status: Abnormal   Collection Time: 03/21/17  6:44 PM  Result Value Ref Range   Glucose, UA NEGATIVE NEGATIVE mg/dL   Bilirubin Urine NEGATIVE NEGATIVE   Ketones, ur NEGATIVE NEGATIVE mg/dL   Specific Gravity, Urine 1.025 1.005 - 1.030   Hgb urine dipstick NEGATIVE NEGATIVE   pH 6.5 5.0 - 8.0   Protein, ur NEGATIVE NEGATIVE mg/dL   Urobilinogen, UA 1.0 0.0 - 1.0 mg/dL   Nitrite POSITIVE (A) NEGATIVE   Leukocytes, UA TRACE (A) NEGATIVE    Assessment and Plan :   Cystitis  Left hip pain  Contusion of left thigh, initial  encounter  Will start patient on Bactrim, urine culture pending. Counseled on supportive care for contusion. Return-to-clinic precautions discussed, patient verbalized understanding.    Jaynee Eagles, PA-C 03/21/17 2244

## 2017-03-21 NOTE — ED Triage Notes (Signed)
Pt states "I know I have a UTI, I can tell by the color and odor that its not right" denies pain. Pt also states "I fell and I have a bruise and hematoma on my left hip, its a week old but its still hurting".

## 2017-04-12 ENCOUNTER — Ambulatory Visit: Payer: 59 | Admitting: Hematology and Oncology

## 2017-04-12 ENCOUNTER — Other Ambulatory Visit: Payer: 59

## 2017-04-13 ENCOUNTER — Other Ambulatory Visit: Payer: Self-pay | Admitting: Hematology and Oncology

## 2017-04-13 ENCOUNTER — Other Ambulatory Visit: Payer: Self-pay | Admitting: *Deleted

## 2017-04-13 DIAGNOSIS — Z170 Estrogen receptor positive status [ER+]: Principal | ICD-10-CM

## 2017-04-13 DIAGNOSIS — C50411 Malignant neoplasm of upper-outer quadrant of right female breast: Secondary | ICD-10-CM

## 2017-04-13 MED FILL — ANASTROZOLE 1 MG TABLET: 1 | 90 days supply | Qty: 90 | Fill #3

## 2017-04-14 ENCOUNTER — Encounter: Payer: Self-pay | Admitting: *Deleted

## 2017-04-14 ENCOUNTER — Ambulatory Visit (HOSPITAL_COMMUNITY)
Admission: RE | Admit: 2017-04-14 | Discharge: 2017-04-14 | Disposition: A | Discharge status: Home / Self Care | Payer: 59 | Source: Ambulatory Visit | Attending: Internal Medicine | Admitting: Internal Medicine

## 2017-04-14 ENCOUNTER — Inpatient Hospital Stay: Payer: 59 | Attending: Hematology and Oncology | Admitting: Adult Health

## 2017-04-14 ENCOUNTER — Inpatient Hospital Stay: Payer: 59

## 2017-04-14 ENCOUNTER — Other Ambulatory Visit: Payer: 59

## 2017-04-14 ENCOUNTER — Ambulatory Visit (HOSPITAL_COMMUNITY)
Admission: RE | Admit: 2017-04-14 | Discharge: 2017-04-14 | Disposition: A | Discharge status: Home / Self Care | Payer: 59 | Source: Ambulatory Visit | Attending: Family Medicine | Admitting: Family Medicine

## 2017-04-14 VITALS — BP 126/86 | HR 80 | Wt 176.8 lb

## 2017-04-14 VITALS — BP 115/83 | HR 84 | Temp 97.8°F | Resp 18 | Ht 67.0 in | Wt 176.2 lb

## 2017-04-14 DIAGNOSIS — D701 Agranulocytosis secondary to cancer chemotherapy: Secondary | ICD-10-CM | POA: Diagnosis not present

## 2017-04-14 DIAGNOSIS — Z923 Personal history of irradiation: Secondary | ICD-10-CM | POA: Insufficient documentation

## 2017-04-14 DIAGNOSIS — Z8249 Family history of ischemic heart disease and other diseases of the circulatory system: Secondary | ICD-10-CM | POA: Diagnosis not present

## 2017-04-14 DIAGNOSIS — Z79811 Long term (current) use of aromatase inhibitors: Secondary | ICD-10-CM | POA: Diagnosis not present

## 2017-04-14 DIAGNOSIS — Z90710 Acquired absence of both cervix and uterus: Secondary | ICD-10-CM | POA: Insufficient documentation

## 2017-04-14 DIAGNOSIS — R530 Neoplastic (malignant) related fatigue: Secondary | ICD-10-CM | POA: Insufficient documentation

## 2017-04-14 DIAGNOSIS — Z833 Family history of diabetes mellitus: Secondary | ICD-10-CM | POA: Insufficient documentation

## 2017-04-14 DIAGNOSIS — Z811 Family history of alcohol abuse and dependence: Secondary | ICD-10-CM | POA: Insufficient documentation

## 2017-04-14 DIAGNOSIS — Z170 Estrogen receptor positive status [ER+]: Principal | ICD-10-CM

## 2017-04-14 DIAGNOSIS — C50411 Malignant neoplasm of upper-outer quadrant of right female breast: Secondary | ICD-10-CM | POA: Insufficient documentation

## 2017-04-14 DIAGNOSIS — Z87440 Personal history of urinary (tract) infections: Secondary | ICD-10-CM | POA: Diagnosis not present

## 2017-04-14 DIAGNOSIS — Z881 Allergy status to other antibiotic agents status: Secondary | ICD-10-CM | POA: Diagnosis not present

## 2017-04-14 DIAGNOSIS — Z823 Family history of stroke: Secondary | ICD-10-CM | POA: Insufficient documentation

## 2017-04-14 DIAGNOSIS — Z9013 Acquired absence of bilateral breasts and nipples: Secondary | ICD-10-CM | POA: Diagnosis not present

## 2017-04-14 DIAGNOSIS — Z9221 Personal history of antineoplastic chemotherapy: Secondary | ICD-10-CM | POA: Insufficient documentation

## 2017-04-14 DIAGNOSIS — Z79899 Other long term (current) drug therapy: Secondary | ICD-10-CM | POA: Diagnosis not present

## 2017-04-14 DIAGNOSIS — Z8261 Family history of arthritis: Secondary | ICD-10-CM | POA: Diagnosis not present

## 2017-04-14 DIAGNOSIS — F329 Major depressive disorder, single episode, unspecified: Secondary | ICD-10-CM | POA: Insufficient documentation

## 2017-04-14 DIAGNOSIS — Z006 Encounter for examination for normal comparison and control in clinical research program: Secondary | ICD-10-CM | POA: Diagnosis not present

## 2017-04-14 DIAGNOSIS — Z803 Family history of malignant neoplasm of breast: Secondary | ICD-10-CM | POA: Insufficient documentation

## 2017-04-14 LAB — CBC WITH DIFFERENTIAL (CANCER CENTER ONLY)
Basophils Absolute: 0.1 10*3/uL (ref 0.0–0.1)
Basophils Relative: 3 %
Eosinophils Absolute: 0 10*3/uL (ref 0.0–0.5)
Eosinophils Relative: 2 %
HEMATOCRIT: 34.7 % — AB (ref 34.8–46.6)
Hemoglobin: 12 g/dL (ref 11.6–15.9)
LYMPHS PCT: 36 %
Lymphs Abs: 0.6 10*3/uL — ABNORMAL LOW (ref 0.9–3.3)
MCH: 35.3 pg — ABNORMAL HIGH (ref 25.1–34.0)
MCHC: 34.6 g/dL (ref 31.5–36.0)
MCV: 102.1 fL — AB (ref 79.5–101.0)
MONO ABS: 0.2 10*3/uL (ref 0.1–0.9)
Monocytes Relative: 14 %
NEUTROS ABS: 0.8 10*3/uL — AB (ref 1.5–6.5)
Neutrophils Relative %: 45 %
Platelet Count: 129 10*3/uL — ABNORMAL LOW (ref 145–400)
RBC: 3.4 MIL/uL — AB (ref 3.70–5.45)
RDW: 16.4 % — AB (ref 11.2–14.5)
WBC Count: 1.7 10*3/uL — ABNORMAL LOW (ref 3.9–10.3)

## 2017-04-14 LAB — CMP (CANCER CENTER ONLY)
ALT: 18 U/L (ref 0–55)
ANION GAP: 8 (ref 3–11)
AST: 20 U/L (ref 5–34)
Albumin: 4.1 g/dL (ref 3.5–5.0)
Alkaline Phosphatase: 73 U/L (ref 40–150)
BILIRUBIN TOTAL: 0.6 mg/dL (ref 0.2–1.2)
BUN: 21 mg/dL (ref 7–26)
CO2: 26 mmol/L (ref 22–29)
Calcium: 9.6 mg/dL (ref 8.4–10.4)
Chloride: 106 mmol/L (ref 98–109)
Creatinine: 0.76 mg/dL (ref 0.60–1.10)
GFR, Est AFR Am: >60 mL/min (ref >60)
GFR, Estimated: >60 mL/min (ref >60)
GLUCOSE: 98 mg/dL (ref 70–140)
POTASSIUM: 4.2 mmol/L (ref 3.5–5.1)
SODIUM: 140 mmol/L (ref 136–145)
TOTAL PROTEIN: 7.1 g/dL (ref 6.4–8.3)

## 2017-04-14 MED ORDER — INV-ATORVASTATIN/PLACEBO 40 MG TABS WAKE FOREST WF 98213: 1.0000 | ORAL_TABLET | Freq: Every day | ORAL | 0 refills | Status: DC

## 2017-04-14 MED FILL — VENLAFAXINE HCL ER 75 MG CA: 75 | 30 days supply | Qty: 30 | Fill #1

## 2017-04-14 NOTE — Progress Notes (Signed)
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 Lindi Adie to the cardio-oncology clinic.   SUMMARY OF ONCOLOGIC HISTORY:       Breast cancer of upper-outer quadrant of right female breast ([place])   09/02/2015 Mammogram    Right breast UOQ: 2 masses 3 cm apart, U/S they measured 3.4 cm, larger mass at 1.6 cm, in addition, 1 cm mass at 9:00 and 0.9 cm mass at 8:30( could be intramammary LN); right axillary LN 11 mm; microcalcs 10 cm span       09/09/2015 Initial Diagnosis    Right breast biopsy OUQ: DCIS with calcs and necrosis ER 95%, PR 80%; 9:30 position 5cmfn: IDC with DCIS ER 95%, PR 65%, Ki-67 20%,; right biopsy 8:30 position 3cmfn ER 95%, PR 5%, Ki-67 10%: intramammary LN with IDC      09/12/2015 Breast MRI    Multicentric right breast cancer cluster of enhancing masses 4.1 x 2.2 x 2.6 cm, LOQ: 1.1 cm enhancing mass previously biopsied, subcutaneous low right breast 2 cm mass not biopsied, multiple other small enhancing masses, 1 cm right axillary lymph node      10/01/2015 Surgery    Right mastectomy: IDC grade 2, multifocal largest 2.5 cm, with high-grade DCIS, broadly present at  anterior margin and inferior margin 2/5 LN positive, LVI Present,  Left mastectomy: ALH, ER 95%, PR 5-65%, HER-2 negative, Ki-67 10-20%, T2 N1 a stage IIB      11/05/2015 - 03/17/2016 Chemotherapy    Adjuvant chemotherapy with dose dense Adriamycin and Cytoxan 4 followed by Abraxane weekly 12      04/22/2016 - 06/08/2016 Radiation Therapy    Adjuvant radiation therapy      06/22/2016 Surgery    Hysterectomy with bilateral salpingo-oophorectomy: Negative for malignancy           Returns today for follow up. Finished Adriamycin in January 2018. 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 Underwent breast  reconstruction with lat flap in 12/18. Since that time much more fatigued. Denies edema, orthopnea or PND. Working FT in [employer].   Echo 04/14/17 EF 60-65% LS' 14.8 GLS - 21.7% Personally reviewed   ECHO 02/26/16: EF 60% lateral s' 12.7 GLS -21.7% Echo 08/11/16: EF 60%, lateral s' 13.2  GLS -19.3 %    Past Medical History:  Diagnosis Date  . Arthritis    right knee (12/20/2016)  . Cancer of right breast ([place])   . Depression   . PONV (postoperative nausea and vomiting)   . Sciatic leg pain    left leg    Current Outpatient Medications  Medication Sig Dispense Refill  . Atorvastatin Calcium (INVESTIGATIONAL ATORVASTATIN/PLACEBO) 40 MG tablet [facility] [id number] Take 1 tablet by mouth daily. Take 1 tablet daily with or without food. 180 tablet 0  . Biotin 10 MG TABS Take 10 mg by mouth daily.    . Calcium Carbonate-Vitamin D (CALCIUM 600+D PO) Take 1 tablet by mouth daily.    . Investigational palbociclib (IBRANCE) 125 MG capsule [facility] AFT-05 [name] Take 1 capsule (125 mg total) by mouth daily. Take with food. Swallow whole. Do not chew. Take on days 1-21. Repeat every 28 days. 69 capsule 0  . Lactobacillus Rhamnosus, GG, (CULTURELLE) CAPS Take 1 capsule by mouth daily.    . Melatonin 5 MG CAPS  Take 5 mg by mouth at bedtime as needed (sleep).    . Multiple Vitamin (MULTIVITAMIN) tablet Take 1 tablet by mouth daily.    . naproxen sodium (ANAPROX) 220 MG tablet Take 220-440 mg by mouth daily as needed (pain).     . ondansetron (ZOFRAN) 8 MG tablet Take 0.5 tablets (4 mg total) every 8 (eight) hours as needed by mouth for nausea or vomiting. (Patient taking differently: Take 4-8 mg by mouth every 8 (eight) hours as needed for nausea or vomiting. ) 30 tablet 0  . venlafaxine XR (EFFEXOR-XR) 75 MG 24 hr capsule Take 1 capsule (75 mg total) by mouth daily with breakfast. 30 capsule 6  . anastrozole (ARIMIDEX) 1 MG tablet Take 1 tablet (1 mg total) by mouth daily. (Patient not taking:  Reported on 12/30/2016) 90 tablet 3   No current facility-administered medications for this encounter.     Allergies  Allergen Reactions  . Keflex [Cephalexin] Anaphylaxis  . Decadron [Dexamethasone] Anxiety    Beyond hyper      Social History   Socioeconomic History  . Marital status: Divorced    Spouse name: Not on file  . Number of children: Not on file  . Years of education: Not on file  . Highest education level: Not on file  Occupational History  . Not on file  Social Needs  . Financial resource strain: Not on file  . Food insecurity:    Worry: Not on file    Inability: Not on file  . Transportation needs:    Medical: Not on file    Non-medical: Not on file  Tobacco Use  . Smoking status: Never Smoker  . Smokeless tobacco: Never Used  Substance and Sexual Activity  . Alcohol use: Yes    Alcohol/week: 1.2 oz    Types: 2 Glasses of wine per week  . Drug use: No  . Sexual activity: Yes    Birth control/protection: IUD  Lifestyle  . Physical activity:    Days per week: Not on file    Minutes per session: Not on file  . Stress: Not on file  Relationships  . Social connections:    Talks on phone: Not on file    Gets together: Not on file    Attends religious service: Not on file    Active member of club or organization: Not on file    Attends meetings of clubs or organizations: Not on file    Relationship status: Not on file  . Intimate partner violence:    Fear of current or ex partner: Not on file    Emotionally abused: Not on file    Physically abused: Not on file    Forced sexual activity: Not on file  Other Topics Concern  . Not on file  Social History Narrative  . Not on file      Family History  Problem Relation Age of Onset  . Depression Mother   . Inflammatory bowel disease Mother   . Alcohol abuse Mother   . Bipolar disorder Mother   . Hypertension Son   . Other Brother        severe intellectual disabilities  . Arthritis Maternal  Uncle        hx knee replacement surgeries  . Stroke Maternal Grandmother 86  . Breast cancer Maternal Grandmother        dx before menopause  . Uterine cancer Maternal Grandmother        dx late 23s  .  Bipolar disorder Maternal Grandfather   . Stroke Paternal Grandmother 28  . Diabetes Paternal Grandmother        later in life  . Heart attack Paternal Grandfather     Vitals:   04/14/17 1416  BP: 126/86  Pulse: 80  SpO2: 98%  Weight: 176 lb 12.8 oz (80.2 kg)    PHYSICAL EXAM: General:  Well appearing. No resp difficulty HEENT: normal Neck: supple. no JVD. Carotids 2+ bilat; no bruits. No lymphadenopathy or thryomegaly appreciated. Cor: PMI nondisplaced. Regular rate & rhythm. No rubs, gallops or murmurs. Lungs: clear Abdomen: soft, nontender, nondistended. No hepatosplenomegaly. No bruits or masses. Good bowel sounds. Extremities: no cyanosis, clubbing, rash, edema Neuro: alert & orientedx3, cranial nerves grossly intact. moves all 4 extremities w/o difficulty. Affect pleasant    ASSESSMENT & PLAN: 1. R breast CA (ER/PR+) --has completed neoadjuvant chemo including adriamycin in 1/18. Continues anastrozole.   - s/p hysterectomy in June 2018. Breast reconstruction 12/18 - I reviewed echos personally. EF and Doppler parameters stable. No HF on exam. Continue Herceptin.   - Now getting Ibrance x 2 years (starting 10/18)  Minimal risk of cardiotoxicity with Ibrance, she is going to take 3 weeks on, 1 week off. She is at high risk for breast CA recurrence. - Repeat echo screening every 6 months  Glori Bickers, MD 2:40 PM

## 2017-04-14 NOTE — Addendum Note (Signed)
Encounter addended by: Scarlette Calico, RN on: 04/14/2017 3:05 PM  Actions taken: Order list changed, Diagnosis association updated, Sign clinical note

## 2017-04-14 NOTE — Progress Notes (Signed)
04/14/17 at 2:29pm- PREVENT/ [facility] [id number]- month 18 study notes - The pt was into the cancer center this afternoon for her month 18 visit.  The research nurse gave the pt her month 18 questionnaires upon arrival to the cancer center.  The pt completed her questionnaires, and the research nurse reviewed them for accuracy and completeness.  The pt returned her PREVENT study drug bottle (atorvastatin/placebo) with 7 capsules remaining in the bottle.  The pt denied "any problems taking her study drug".  She reviewed her current medication list on file, and she informed the research nurse of all new medications and dosage changes.   She denied having any difficulty filling out the medication diaries.  The pt said that she would return her completed April medication diary in May.  The pt was seen and examined today by Dr. Geralyn Flash NP, [place] Phlegm.  The pt had a standard of care echocardiogram and was seen by Dr. Haroldine Laws this afternoon for follow up.  The pt's EF was within normal limits at 60-65%.  The pt was dispensed her new bottle of study drug for self administration.  The pt was told to continue to take 1 capsule daily.  The pt was also given her medication diaries through October 2019.  The pt was given some self-addressed stamp envelopes to mail her completed medication diaries back to the research nurse.  The pt was thanked for her continued support on the clinical trial.  Brion Aliment RN, BSN, CCRP Clinical Research Nurse 04/14/2017 2:34 PM

## 2017-04-14 NOTE — Patient Instructions (Signed)
We will contact you in 6 months to schedule your next appointment and echocardiogram  

## 2017-04-14 NOTE — Progress Notes (Signed)
  Echocardiogram 2D Echocardiogram has been performed.  Melanie Barber 04/14/2017, 1:51 PM

## 2017-04-14 NOTE — Progress Notes (Signed)
04/14/17 at 4:55pm - [name]/AFT-05- Reconsent visit-  The pt was into the cancer center this afternoon for her [name] assessments.  The pt was informed that the consent form has been updated with some new changes.  The pt was given a copy of the new consent form with all of the changes.  The pt was given time to read over the new consent form.  The pt read over the new consent form version 5.1 dated 02/23/17, and the pt had no questions/concerns about her continued participation in the study.  The pt signed the consent form, and she was given a copy of the consent form for her future reference.  The pt was thanked for her support of the trial.   Brion Aliment RN, BSN, CCRP Clinical Research Nurse 04/14/2017 5:01 PM

## 2017-04-14 NOTE — Progress Notes (Addendum)
[facility] Cancer Follow up:    [name], Melanie R, MD [address] [place] [address]   DIAGNOSIS: Cancer Staging Breast cancer of upper-outer quadrant of right female breast (HCC) Staging form: Breast, AJCC 7th Edition - Pathologic: Stage IIB (T2, N1, cM0) - Signed by Causey, Lindsey Cornetto, NP on 10/08/2016   SUMMARY OF ONCOLOGIC HISTORY:   Breast cancer of upper-outer quadrant of right female breast (HCC)   09/02/2015 Mammogram    Right breast UOQ: 2 masses 3 cm apart, U/S they measured 3.4 cm, larger mass at 1.6 cm, in addition, 1 cm mass at 9:00 and 0.9 cm mass at 8:30( could be intramammary LN); right axillary LN 11 mm; microcalcs 10 cm span       09/09/2015 Initial Diagnosis    Right breast biopsy OUQ: DCIS with calcs and necrosis ER 95%, PR 80%; 9:30 position 5cmfn: IDC with DCIS ER 95%, PR 65%, Ki-67 20%,; right biopsy 8:30 position 3cmfn ER 95%, PR 5%, Ki-67 10%: intramammary LN with IDC      09/12/2015 Breast MRI    Multicentric right breast cancer cluster of enhancing masses 4.1 x 2.2 x 2.6 cm, LOQ: 1.1 cm enhancing mass previously biopsied, subcutaneous low right breast 2 cm mass not biopsied, multiple other small enhancing masses, 1 cm right axillary lymph node      10/01/2015 Surgery    Right mastectomy: IDC grade 2, multifocal largest 2.5 cm, with high-grade DCIS, broadly present at  anterior margin and inferior margin 2/5 LN positive, LVI Present,  Left mastectomy: ALH, ER 95%, PR 5-65%, HER-2 negative, Ki-67 10-20%, T2 N1 a stage IIB      11/05/2015 - 03/17/2016 Chemotherapy    Adjuvant chemotherapy with dose dense Adriamycin and Cytoxan 4 followed by Abraxane weekly 12      04/22/2016 - 06/08/2016 Radiation Therapy    Adjuvant radiation therapy      06/22/2016 Surgery    Hysterectomy with bilateral salpingo-oophorectomy: Negative for malignancy      06/30/2016 -  Anti-estrogen oral therapy    Anastrozole 1 mg daily, patient is on a  clinical trial [name] ( randomized to Palbociclib)       CURRENT THERAPY: Anastrozole on [name], randomized to Palbociclib  INTERVAL HISTORY: Melanie Barber 52 y.o. female returns for evaluation of her breast cancer.  She is on [name] study with palbociclib.  She is fatigued.  She has had 2 urinary tract infections while on study.  She is sexually active and does not void after intercourse.  She is experiencing night sweats.  These wake her up in the middle of the night.  She is not happy that this is her second break of Palbociclib, and that will mean decreasing the dose of Palbociclib to 100mg daily.     Patient Active Problem List   Diagnosis Date Noted  . History of breast cancer 12/20/2016  . History of bilateral breast cancer 06/22/2016  . Genetic testing 11/17/2015  . Malignant neoplasm of upper-outer quadrant of right breast in female, estrogen receptor positive (HCC) 10/09/2015  . Breast cancer of upper-outer quadrant of right female breast (HCC) 09/12/2015    is allergic to keflex [cephalexin] and decadron [dexamethasone].  MEDICAL HISTORY: Past Medical History:  Diagnosis Date  . Arthritis    right knee (12/20/2016)  . Cancer of right breast (HCC)   . Depression   . PONV (postoperative nausea and vomiting)   . Sciatic leg pain      left leg    SURGICAL HISTORY: Past Surgical History:  Procedure Laterality Date  . ABDOMINAL HYSTERECTOMY    . BREAST BIOPSY Right   . BREAST CYST ASPIRATION Right   . BREAST RECONSTRUCTION WITH PLACEMENT OF TISSUE EXPANDER AND FLEX HD (ACELLULAR HYDRATED DERMIS) Bilateral 10/01/2015  . BREAST RECONSTRUCTION WITH PLACEMENT OF TISSUE EXPANDER AND FLEX HD (ACELLULAR HYDRATED DERMIS) Bilateral 10/01/2015   Procedure: BREAST RECONSTRUCTION WITH PLACEMENT OF TISSUE EXPANDER AND CORTIVA;  Surgeon: Brinda Thimmappa, MD;  Location: [facility];  Service: Plastics;  Laterality: Bilateral;  . CERVIX SURGERY  1993   growth removed  . LATISSIMUS FLAP  TO BREAST Right 12/20/2016  . LATISSIMUS FLAP TO BREAST Right 12/20/2016   Procedure: RIGHT LATISSIMUS FLAP TO BREAST;  Surgeon: Thimmappa, Brinda, MD;  Location: [facility];  Service: Plastics;  Laterality: Right;  . LIPOSUCTION WITH LIPOFILLING N/A 12/20/2016   Procedure: LIPOSUCTION WITH LIPOFILLING;  Surgeon: Thimmappa, Brinda, MD;  Location: [facility];  Service: Plastics;  Laterality: N/A;  . MASTECTOMY Left 10/01/2015   NIPPLE SPARING  . MASTECTOMY COMPLETE / SIMPLE W/ SENTINEL NODE BIOPSY Right 10/01/2015   NIPPLE SPARING  . NASAL/SINUS ENDOSCOPY Bilateral 2005  . NIPPLE SPARING MASTECTOMY/SENTINAL LYMPH NODE BIOPSY/RECONSTRUCTION/PLACEMENT OF TISSUE EXPANDER Bilateral 10/01/2015   Procedure: BILATERAL NIPPLE SPARING MASTECTOMY WITH RIGHT SENTINAL LYMPH NODE BIOPSY;  Surgeon: Matthew Wakefield, MD;  Location: [facility];  Service: General;  Laterality: Bilateral;  . PORT-A-CATH REMOVAL Right 04/01/2016   Procedure: MINOR REMOVAL PORT-A-CATH;  Surgeon: Matthew Wakefield, MD;  Location: [facility];  Service: General;  Laterality: Right;  . PORTACATH PLACEMENT Right 10/01/2015   Procedure: INSERTION PORT-A-CATH;  Surgeon: Matthew Wakefield, MD;  Location: [facility];  Service: General;  Laterality: Right;  . REMOVAL OF BILATERAL TISSUE EXPANDERS WITH PLACEMENT OF BILATERAL BREAST IMPLANTS Bilateral 12/20/2016   SILICONE BREAST IMPLANTS, LIPOFILLING FROM ABDOMEN TO BILATERAL CHESTBilateral    . REMOVAL OF BILATERAL TISSUE EXPANDERS WITH PLACEMENT OF BILATERAL BREAST IMPLANTS Bilateral 12/20/2016   Procedure: REMOVAL OF BILATERAL TISSUE EXPANDERS WITH PLACEMENT OF BILATERAL SILICONE BREAST IMPLANTS, LIPOFILLING FROM ABDOMEN TO BILATERAL CHEST;  Surgeon: Thimmappa, Brinda, MD;  Location: [facility];  Service: Plastics;  Laterality: Bilateral;  . ROBOTIC ASSISTED TOTAL HYSTERECTOMY WITH BILATERAL SALPINGO OOPHERECTOMY Bilateral 06/22/2016   Procedure: XI ROBOTIC ASSISTED TOTAL HYSTERECTOMY WITH BILATERAL  SALPINGO OOPHORECTOMY;  Surgeon: Rossi, Emma, MD;  Location: [facility];  Service: Gynecology;  Laterality: Bilateral;  . TOE SURGERY Left 1990   "bone spur on great toe"  . TONSILLECTOMY  1977  . ULTRASOUND GUIDANCE FOR VASCULAR ACCESS Right 10/01/2015   Procedure: ULTRASOUND GUIDANCE;  Surgeon: Matthew Wakefield, MD;  Location: [facility];  Service: General;  Laterality: Right;  . WISDOM TOOTH EXTRACTION      SOCIAL HISTORY: Social History   Socioeconomic History  . Marital status: Divorced    Spouse name: Not on file  . Number of children: Not on file  . Years of education: Not on file  . Highest education level: Not on file  Occupational History  . Not on file  Social Needs  . Financial resource strain: Not on file  . Food insecurity:    Worry: Not on file    Inability: Not on file  . Transportation needs:    Medical: Not on file    Non-medical: Not on file  Tobacco Use  . Smoking status: Never Smoker  . Smokeless tobacco: Never Used  Substance and Sexual Activity  . Alcohol use: Yes      Alcohol/week: 1.2 oz    Types: 2 Glasses of wine per week  . Drug use: No  . Sexual activity: Yes    Birth control/protection: IUD  Lifestyle  . Physical activity:    Days per week: Not on file    Minutes per session: Not on file  . Stress: Not on file  Relationships  . Social connections:    Talks on phone: Not on file    Gets together: Not on file    Attends religious service: Not on file    Active member of club or organization: Not on file    Attends meetings of clubs or organizations: Not on file    Relationship status: Not on file  . Intimate partner violence:    Fear of current or ex partner: Not on file    Emotionally abused: Not on file    Physically abused: Not on file    Forced sexual activity: Not on file  Other Topics Concern  . Not on file  Social History Narrative  . Not on file    FAMILY HISTORY: Family History  Problem Relation Age of Onset  . Depression  Mother   . Inflammatory bowel disease Mother   . Alcohol abuse Mother   . Bipolar disorder Mother   . Hypertension Son   . Other Brother        severe intellectual disabilities  . Arthritis Maternal Uncle        hx knee replacement surgeries  . Stroke Maternal Grandmother 86  . Breast cancer Maternal Grandmother        dx before menopause  . Uterine cancer Maternal Grandmother        dx late 60s  . Bipolar disorder Maternal Grandfather   . Stroke Paternal Grandmother 84  . Diabetes Paternal Grandmother        later in life  . Heart attack Paternal Grandfather     Review of Systems  Constitutional: Positive for fatigue. Negative for appetite change, chills, fever and unexpected weight change.  HENT:   Negative for hearing loss, lump/mass and trouble swallowing.   Eyes: Negative for eye problems and icterus.  Respiratory: Negative for chest tightness, cough and shortness of breath.   Cardiovascular: Negative for chest pain, leg swelling and palpitations.  Gastrointestinal: Negative for abdominal distention, abdominal pain, constipation, diarrhea, nausea and vomiting.  Endocrine: Positive for hot flashes.  Skin: Negative for itching and rash.  Neurological: Negative for dizziness, extremity weakness, headaches and numbness.  Hematological: Negative for adenopathy. Does not bruise/bleed easily.  Psychiatric/Behavioral: Negative for depression. The patient is not nervous/anxious.       PHYSICAL EXAMINATION  ECOG PERFORMANCE STATUS: 1 - Symptomatic but completely ambulatory  Vitals:   04/14/17 1535  BP: 115/83  Pulse: 84  Resp: 18  Temp: 97.8 F (36.6 C)  SpO2: 99%    Physical Exam  Constitutional: She is oriented to person, place, and time and well-developed, well-nourished, and in no distress.  HENT:  Head: Normocephalic and atraumatic.  Mouth/Throat: Oropharynx is clear and moist. No oropharyngeal exudate.  Eyes: Pupils are equal, round, and reactive to light. No  scleral icterus.  Neck: Neck supple.  Cardiovascular: Normal rate, regular rhythm and normal heart sounds.  Pulmonary/Chest: Effort normal and breath sounds normal. No respiratory distress. She has no wheezes.  Abdominal: Soft. Bowel sounds are normal.  Musculoskeletal: She exhibits no edema.  Lymphadenopathy:    She has no cervical adenopathy.  Neurological: She is alert   and oriented to person, place, and time.  Skin: Skin is warm and dry.  Psychiatric: Mood and affect normal.    LABORATORY DATA:  CBC    Component Value Date/Time   WBC 1.7 (L) 04/14/2017 1208   WBC 4.8 01/20/2017 1451   RBC 3.40 (L) 04/14/2017 1208   HGB 12.5 01/20/2017 1451   HGB 12.0 10/22/2016 0951   HCT 34.7 (L) 04/14/2017 1208   HCT 34.4 (L) 10/22/2016 0951   PLT 129 (L) 04/14/2017 1208   PLT 113 (L) 10/22/2016 0951   MCV 102.1 (H) 04/14/2017 1208   MCV 98.8 10/22/2016 0951   MCH 35.3 (H) 04/14/2017 1208   MCHC 34.6 04/14/2017 1208   RDW 16.4 (H) 04/14/2017 1208   RDW 17.4 (H) 10/22/2016 0951   LYMPHSABS 0.6 (L) 04/14/2017 1208   LYMPHSABS 0.5 (L) 10/22/2016 0951   MONOABS 0.2 04/14/2017 1208   MONOABS 0.2 10/22/2016 0951   EOSABS 0.0 04/14/2017 1208   EOSABS 0.0 10/22/2016 0951   BASOSABS 0.1 04/14/2017 1208   BASOSABS 0.0 10/22/2016 0951    CMP     Component Value Date/Time   NA 140 04/14/2017 1208   NA 140 10/22/2016 0951   K 4.2 04/14/2017 1208   K 4.1 10/22/2016 0951   CL 106 04/14/2017 1208   CO2 26 04/14/2017 1208   CO2 26 10/22/2016 0951   GLUCOSE 98 04/14/2017 1208   GLUCOSE 76 10/22/2016 0951   BUN 21 04/14/2017 1208   BUN 21.4 10/22/2016 0951   CREATININE 0.76 04/14/2017 1208   CREATININE 0.7 10/22/2016 0951   CALCIUM 9.6 04/14/2017 1208   CALCIUM 9.1 10/22/2016 0951   PROT 7.1 04/14/2017 1208   PROT 6.7 10/22/2016 0951   ALBUMIN 4.1 04/14/2017 1208   ALBUMIN 4.0 10/22/2016 0951   AST 20 04/14/2017 1208   AST 21 10/22/2016 0951   ALT 18 04/14/2017 1208   ALT 20  10/22/2016 0951   ALKPHOS 73 04/14/2017 1208   ALKPHOS 68 10/22/2016 0951   BILITOT 0.6 04/14/2017 1208   BILITOT 0.64 10/22/2016 0951   GFRNONAA >60 04/14/2017 1208   GFRAA >60 04/14/2017 1208      ASSESSMENT and PLAN:   Breast cancer of upper-outer quadrant of right female breast ([place]) Bilateral mastectomies 10/01/2015 With reconstruction Right mastectomy: IDC grade 2, multifocal largest 2.5 cm, with high-grade DCIS, broadly present at anterior margin and inferior margin 2/5 LN positive, LVI Present,  Left mastectomy: ALH, ER 95%, PR 5-65%, HER-2 negative, Ki-67 10-20%,  Pathologic stage: T2 N1 a stage IIB Mammaprint: High risk CT-CAP and bone scan: No evidence of metastatic disease ----------------------------------------------------------------------------------------------------------------------------------------------------------------- Treatment summary: 1. Adjuvant chemotherapy with dose dense Adriamycin and Cytoxan 4 followed by Abraxane weekly 12 started 11/05/2015 completed 03/17/2016 3. Followed by adjuvant radiation therapy Completed 06/08/2016 4. Followed by adjuvant antiestrogen therapy with anastrozole 7years  PREVENT trial: [id number]  No toxicities to Study Drug -------------------------------------------------------------------------------------------------------------------------------------------------------- Anastrozole Toxicities: No hot flashes or myalgias. Patient is tolerating anastrozole extremely well.  [name] clinical trial:Patient has been randomized to Palbociclib  Palbociclib toxicities: 1. Fatigue  2. Neutropenia: Today's ANC is 0.8   Elizabethann will have to hold Palbociclib for another week.  She will subsequently require dose reduction to 130m per day. She on the PREVENT trial too and is tolerating her study drug, atorvastatin/placebo without any difficulty.  I recommended she void after intercourse to help prevent future UTIs.   She will start doing this.  She will return next week for  lab and f/u with Dr. Gudena.         All questions were answered. The patient knows to call the clinic with any problems, questions or concerns. We can certainly see the patient much sooner if necessary.  A total of (30) minutes of face-to-face time was spent with this patient with greater than 50% of that time in counseling and care-coordination.  This note was electronically signed. Lindsey C Causey, NP 04/15/2017 

## 2017-04-14 NOTE — Progress Notes (Signed)
04/14/17 at 2:36pm - Cycle 9, day 1 delayed- The pt was into the cancer center this afternoon for her cycle 9 assessments.  The pt returned her cycle 6-8 study drug bottles.  All of the bottles were empty, and they were taken to the pharmacy for the drug accountability check.  The pt also returned her cycle 6-8 medication diaries.  The pt's labs were drawn, and the pt's ANC value is 0.8 (grade 3) and WBC's is 1.7 (grade 3).  The pt's grade 1 low platelets are not clinically significant.  The pt was informed that her cycle 9 will be delayed until her [place] improves to 1,000 or greater.  The pt reports the following AE's as ongoing:  Fatigue (grade1), arthralgia (grade 1), hot flashes (grade 2), nausea (grade 1), and hip pain (grade 1).   The pt said that her UTI resolved on 03/28/17.  The pt said that she stopped taking her UTI antibiotic on 03/28/17.  The pt is working 50 hours a week and is performing all of her usual activities.   The pt was seen and examined by Gardenia Phlegm, NP.   The pt denies any changes in her medications.  The pt will be seen in 1 week to see if her counts improve.  The pt verbalized understanding.  The pt was given her cycle 8 anti-hormone diary and told to continue reporting her daily dosage until her next visit.  The pt was told that her dose of palbociclib will be reduced to 100mg  since this is her 2nd occurrence of grade 3 neutropenia.  The nurse had to give the pt some emotional support because the pt is worried that her cancer will return if her dose is lowered.   Brion Aliment RN, BSN, CCRP  Clinical Research Nurse 04/14/2017 2:45 PM   Adverse Event Log  Study/Protocol: [name]/AFT-05 Cycle:8 AE's from 03/22/17- 04/14/17   Event Grade Onset  Date Resolved Date Attribution to Palbociclib Attribution to anastrozole Treatment Comments  Left hip pain 1 03/21/17 ongoing No No Palbociclib/ anastrozole Pt injury from fall  White blood cell decreased 3 04/14/17 ongoing Yes   No Palbociclib/ anastrozole  clinically significant  Neutrophil count decreased 3 04/14/17 Ongoing  Yes  No Palbociclib/ anastrozole  clinically significant- delayed cycle 9  Fatigue 1 09/16/16 Ongoing Yes  Yes Palbociclib/ anastrozole able to work  Platelet count decreased 1 04/14/17 ongoing Yes No Palbociclib/ anastrozole Not clinically significant  Hot flashes 2 10/22/16 Ongoing  No  Yes Palbociclib/ anastrozole pt reports night sweats  Arthralgia  1 10/08/16 Ongoing No  Yes Palbociclib +  anastrozole   Infection(UTI) 2 03/21/17 03/28/17 No No  Palbociclib/ anastrozole Antibiotics given/ stopped on 03/28/17  Nausea  1 11/01/16 ongoing Yes  No Palbociclib / anastrozole   Pt takes Zofran for AE

## 2017-04-15 ENCOUNTER — Encounter: Payer: Self-pay | Admitting: Adult Health

## 2017-04-15 ENCOUNTER — Telehealth: Payer: Self-pay | Admitting: Hematology and Oncology

## 2017-04-15 ENCOUNTER — Telehealth: Payer: Self-pay | Admitting: Adult Health

## 2017-04-15 NOTE — Telephone Encounter (Signed)
Attempted to call pt regarding appts added per 4/12 sch msg - no answer.

## 2017-04-15 NOTE — Telephone Encounter (Signed)
Per 4/11 no los 

## 2017-04-15 NOTE — Assessment & Plan Note (Addendum)
Bilateral mastectomies 10/01/2015 With reconstruction Right mastectomy: IDC grade 2, multifocal largest 2.5 cm, with high-grade DCIS, broadly present at anterior margin and inferior margin 2/5 LN positive, LVI Present,  Left mastectomy: ALH, ER 95%, PR 5-65%, HER-2 negative, Ki-67 10-20%,  Pathologic stage: T2 N1 a stage IIB Mammaprint: High risk CT-CAP and bone scan: No evidence of metastatic disease ----------------------------------------------------------------------------------------------------------------------------------------------------------------- Treatment summary: 1. Adjuvant chemotherapy with dose dense Adriamycin and Cytoxan 4 followed by Abraxane weekly 12 started 11/05/2015 completed 03/17/2016 3. Followed by adjuvant radiation therapy Completed 06/08/2016 4. Followed by adjuvant antiestrogen therapy with anastrozole 7years  PREVENT trial: [id number]  No toxicities to Study Drug -------------------------------------------------------------------------------------------------------------------------------------------------------- Anastrozole Toxicities: No hot flashes or myalgias. Patient is tolerating anastrozole extremely well.  [name] clinical trial:Patient has been randomized to Palbociclib  Palbociclib toxicities: 1. Fatigue  2. Neutropenia: Today's ANC is 0.8   Lyncoln will have to hold Palbociclib for another week.  She will subsequently require dose reduction to 153m per day. She on the PREVENT trial too and is tolerating her study drug, atorvastatin/placebo without any difficulty.  I recommended she void after intercourse to help prevent future UTIs.  She will start doing this.  She will return next week for lab and f/u with Dr. GLindi Adie

## 2017-04-19 ENCOUNTER — Inpatient Hospital Stay: Payer: 59

## 2017-04-19 ENCOUNTER — Other Ambulatory Visit: Payer: Self-pay

## 2017-04-19 DIAGNOSIS — Z90710 Acquired absence of both cervix and uterus: Secondary | ICD-10-CM | POA: Diagnosis not present

## 2017-04-19 DIAGNOSIS — Z170 Estrogen receptor positive status [ER+]: Secondary | ICD-10-CM | POA: Diagnosis not present

## 2017-04-19 DIAGNOSIS — Z006 Encounter for examination for normal comparison and control in clinical research program: Secondary | ICD-10-CM | POA: Diagnosis not present

## 2017-04-19 DIAGNOSIS — D701 Agranulocytosis secondary to cancer chemotherapy: Secondary | ICD-10-CM | POA: Diagnosis not present

## 2017-04-19 DIAGNOSIS — C50411 Malignant neoplasm of upper-outer quadrant of right female breast: Secondary | ICD-10-CM | POA: Diagnosis not present

## 2017-04-19 DIAGNOSIS — Z923 Personal history of irradiation: Secondary | ICD-10-CM | POA: Diagnosis not present

## 2017-04-19 DIAGNOSIS — R530 Neoplastic (malignant) related fatigue: Secondary | ICD-10-CM | POA: Diagnosis not present

## 2017-04-19 DIAGNOSIS — Z9221 Personal history of antineoplastic chemotherapy: Secondary | ICD-10-CM | POA: Diagnosis not present

## 2017-04-19 DIAGNOSIS — Z79811 Long term (current) use of aromatase inhibitors: Secondary | ICD-10-CM | POA: Diagnosis not present

## 2017-04-19 LAB — URINALYSIS, COMPLETE (UACMP) WITH MICROSCOPIC
BILIRUBIN URINE: NEGATIVE
Glucose, UA: NEGATIVE mg/dL
KETONES UR: NEGATIVE mg/dL
Nitrite: NEGATIVE
PROTEIN: 30 mg/dL — AB
SPECIFIC GRAVITY, URINE: 1.015 (ref 1.005–1.030)
pH: 6 (ref 5.0–8.0)

## 2017-04-19 NOTE — Progress Notes (Signed)
Patient Care Team: Fanny Bien, MD as PCP - General (Family Medicine) Rolm Bookbinder, MD as Consulting Physician (General Surgery) Nicholas Lose, MD as Consulting Physician (Hematology and Oncology) Delice Bison Charlestine Massed, NP as Nurse Practitioner (Hematology and Oncology) Kyung Rudd, MD as Consulting Physician (Radiation Oncology)  DIAGNOSIS:  Encounter Diagnosis  Name Primary?  . Malignant neoplasm of upper-outer quadrant of right breast in female, estrogen receptor positive ([place])     SUMMARY OF ONCOLOGIC HISTORY:   Breast cancer of upper-outer quadrant of right female breast ([place])   09/02/2015 Mammogram    Right breast UOQ: 2 masses 3 cm apart, U/S they measured 3.4 cm, larger mass at 1.6 cm, in addition, 1 cm mass at 9:00 and 0.9 cm mass at 8:30( could be intramammary LN); right axillary LN 11 mm; microcalcs 10 cm span       09/09/2015 Initial Diagnosis    Right breast biopsy OUQ: DCIS with calcs and necrosis ER 95%, PR 80%; 9:30 position 5cmfn: IDC with DCIS ER 95%, PR 65%, Ki-67 20%,; right biopsy 8:30 position 3cmfn ER 95%, PR 5%, Ki-67 10%: intramammary LN with IDC      09/12/2015 Breast MRI    Multicentric right breast cancer cluster of enhancing masses 4.1 x 2.2 x 2.6 cm, LOQ: 1.1 cm enhancing mass previously biopsied, subcutaneous low right breast 2 cm mass not biopsied, multiple other small enhancing masses, 1 cm right axillary lymph node      10/01/2015 Surgery    Right mastectomy: IDC grade 2, multifocal largest 2.5 cm, with high-grade DCIS, broadly present at  anterior margin and inferior margin 2/5 LN positive, LVI Present,  Left mastectomy: ALH, ER 95%, PR 5-65%, HER-2 negative, Ki-67 10-20%, T2 N1 a stage IIB      11/05/2015 - 03/17/2016 Chemotherapy    Adjuvant chemotherapy with dose dense Adriamycin and Cytoxan 4 followed by Abraxane weekly 12      04/22/2016 - 06/08/2016 Radiation Therapy    Adjuvant radiation therapy      06/22/2016 Surgery   Hysterectomy with bilateral salpingo-oophorectomy: Negative for malignancy      06/30/2016 -  Anti-estrogen oral therapy    Anastrozole 1 mg daily, patient is on a clinical trial [name] ( randomized to Palbociclib)       CHIEF COMPLIANT: Follow-up to discuss the results of urine analysis and her blood counts  INTERVAL HISTORY: Melanie Barber is a patient with above-mentioned history of estrogen receptor positive breast cancer is currently on immunotherapy clinical trial.  Because of 2 episodes of neutropenia she required dose reduction 125 mg to 100 mg.  She was also complaining of frequent urinary tract infections she had a urine analysis yesterday.  She denies any fevers or chills She has been experiencing fatigue as well as severe sweats at night  REVIEW OF SYSTEMS:   Constitutional: Denies fevers, chills or abnormal weight loss Eyes: Denies blurriness of vision Ears, nose, mouth, throat, and face: Denies mucositis or sore throat Respiratory: Denies cough, dyspnea or wheezes Cardiovascular: Denies palpitation, chest discomfort Gastrointestinal:  Denies nausea, heartburn or change in bowel habits Skin: Denies abnormal skin rashes Lymphatics: Denies new lymphadenopathy or easy bruising Neurological:Denies numbness, tingling or new weaknesses Behavioral/Psych: Mood is stable, no new changes  Extremities: No lower extremity edema Breast:  denies any pain or lumps or nodules in either breasts All other systems were reviewed with the patient and are negative.  I have reviewed the past medical history, past surgical history, social  history and family history with the patient and they are unchanged from previous note.  ALLERGIES:  is allergic to keflex [cephalexin] and decadron [dexamethasone].  MEDICATIONS:  Current Outpatient Medications  Medication Sig Dispense Refill  . anastrozole (ARIMIDEX) 1 MG tablet Take 1 tablet (1 mg total) by mouth daily. (Patient not taking: Reported on  12/30/2016) 90 tablet 3  . Atorvastatin Calcium (INVESTIGATIONAL ATORVASTATIN/PLACEBO) 40 MG tablet [facility] [id number] Take 1 tablet by mouth daily. Take 1 tablet daily with or without food. 180 tablet 0  . Biotin 10 MG TABS Take 10 mg by mouth daily.    . Calcium Carbonate-Vitamin D (CALCIUM 600+D PO) Take 1 tablet by mouth daily.    . Lactobacillus Rhamnosus, GG, (CULTURELLE) CAPS Take 1 capsule by mouth daily.    . Melatonin 5 MG CAPS Take 5 mg by mouth at bedtime as needed (sleep).    . Multiple Vitamin (MULTIVITAMIN) tablet Take 1 tablet by mouth daily.    . naproxen sodium (ANAPROX) 220 MG tablet Take 220-440 mg by mouth daily as needed (pain).     . ondansetron (ZOFRAN) 8 MG tablet Take 0.5 tablets (4 mg total) every 8 (eight) hours as needed by mouth for nausea or vomiting. (Patient taking differently: Take 4-8 mg by mouth every 8 (eight) hours as needed for nausea or vomiting. ) 30 tablet 0  . venlafaxine XR (EFFEXOR-XR) 75 MG 24 hr capsule Take 1 capsule (75 mg total) by mouth daily with breakfast. 30 capsule 6   No current facility-administered medications for this visit.     PHYSICAL EXAMINATION: ECOG PERFORMANCE STATUS: 1 - Symptomatic but completely ambulatory  Vitals:   04/20/17 1554  BP: 124/81  Pulse: 89  Resp: 19  Temp: 97.7 F (36.5 C)  SpO2: 98%   Filed Weights   04/20/17 1554  Weight: 179 lb 8 oz (81.4 kg)    GENERAL:alert, no distress and comfortable SKIN: skin color, texture, turgor are normal, no rashes or significant lesions EYES: normal, Conjunctiva are pink and non-injected, sclera clear OROPHARYNX:no exudate, no erythema and lips, buccal mucosa, and tongue normal  NECK: supple, thyroid normal size, non-tender, without nodularity LYMPH:  no palpable lymphadenopathy in the cervical, axillary or inguinal LUNGS: clear to auscultation and percussion with normal breathing effort HEART: regular rate & rhythm and no murmurs and no lower extremity  edema ABDOMEN:abdomen soft, non-tender and normal bowel sounds MUSCULOSKELETAL:no cyanosis of digits and no clubbing  NEURO: alert & oriented x 3 with fluent speech, no focal motor/sensory deficits EXTREMITIES: No lower extremity edema  LABORATORY DATA:  I have reviewed the data as listed CMP Latest Ref Rng & Units 04/20/2017 04/14/2017 01/20/2017  Glucose 70 - 140 mg/dL 95 98 91  BUN 7 - 26 mg/dL '21 21 13  '$ Creatinine 0.60 - 1.10 mg/dL 0.75 0.76 0.79  Sodium 136 - 145 mmol/L 142 140 140  Potassium 3.5 - 5.1 mmol/L 3.8 4.2 3.9  Chloride 98 - 109 mmol/L 106 106 103  CO2 22 - 29 mmol/L 30(H) 26 27  Calcium 8.4 - 10.4 mg/dL 9.3 9.6 9.7  Total Protein 6.4 - 8.3 g/dL 6.9 7.1 7.5  Total Bilirubin 0.2 - 1.2 mg/dL 0.5 0.6 0.9  Alkaline Phos 40 - 150 U/L 78 73 103  AST 5 - 34 U/L '31 20 22  '$ ALT 0 - 55 U/L 34 18 20    Lab Results  Component Value Date   WBC 2.4 (L) 04/20/2017   HGB 12.5  01/20/2017   HCT 32.8 (L) 04/20/2017   MCV 103.4 (H) 04/20/2017   PLT 161 04/20/2017   NEUTROABS 1.3 (L) 04/20/2017    ASSESSMENT & PLAN:  Breast cancer of upper-outer quadrant of right female breast ([place]) Bilateral mastectomies 10/01/2015 With reconstruction Right mastectomy: IDC grade 2, multifocal largest 2.5 cm, with high-grade DCIS, broadly present at anterior margin and inferior margin 2/5 LN positive, LVI Present,  Left mastectomy: ALH, ER 95%, PR 5-65%, HER-2 negative, Ki-67 10-20%,  Pathologic stage: T2 N1 a stage IIB Mammaprint: High risk CT-CAP and bone scan: No evidence of metastatic disease ----------------------------------------------------------------------------------------------------------------------------------------------------------------- Treatment summary: 1. Adjuvant chemotherapy with dose dense Adriamycin and Cytoxan 4 followed by Abraxane weekly 12 started 11/05/2015 completed 03/17/2016 3. Followed by adjuvant radiation therapy Completed 06/08/2016 4. Followed by  adjuvant antiestrogen therapy with anastrozole 7years  PREVENT trial: [id number] 98213  No toxicities to Study Drug; patient will need to be set up for another cardiac MRI since the expanders have been removed -------------------------------------------------------------------------------------------------------------------------------------------------------- Anastrozole Toxicities: No hot flashes or myalgias. Patient is tolerating anastrozole extremely well.  [name] clinical trial:Patient has been randomized to Palbociclib She will resume palbociclib today.  It was held previously for 28 days before her breast reconstruction  Palbociclib toxicities: 1. Fatigue grade 1-2 2. Neutropenia: Today's ANC is 1.3 patient can initiate 100 mg dosage of palbociclib We had to convince the patient that the dose reduction is necessary.  Frequent urinary tract infections: Recent UA- revealed leukocytes in the urine but nitrite is negative and rare bacteria were noted.  I did not recommend additional antibiotic therapy.  I encouraged the patient to do exercises and stay active.  Because of fatigue she has not been able to do much exercise.  Return to clinic in 3 months for follow-up and labs  Orders Placed This Encounter  Procedures  . CBC with Differential (Cancer Center Only)    Standing Status:   Future    Standing Expiration Date:   04/21/2018  . CMP ([place] only)    Standing Status:   Future    Standing Expiration Date:   04/21/2018   The patient has a good understanding of the overall plan. she agrees with it. she will call with any problems that may develop before the next visit here.   Harriette Ohara, MD 04/20/17

## 2017-04-19 NOTE — Assessment & Plan Note (Signed)
Bilateral mastectomies 10/01/2015 With reconstruction Right mastectomy: IDC grade 2, multifocal largest 2.5 cm, with high-grade DCIS, broadly present at anterior margin and inferior margin 2/5 LN positive, LVI Present,  Left mastectomy: ALH, ER 95%, PR 5-65%, HER-2 negative, Ki-67 10-20%,  Pathologic stage: T2 N1 a stage IIB Mammaprint: High risk CT-CAP and bone scan: No evidence of metastatic disease ----------------------------------------------------------------------------------------------------------------------------------------------------------------- Treatment summary: 1. Adjuvant chemotherapy with dose dense Adriamycin and Cytoxan 4 followed by Abraxane weekly 12 started 11/05/2015 completed 03/17/2016 3. Followed by adjuvant radiation therapy Completed 06/08/2016 4. Followed by adjuvant antiestrogen therapy with anastrozole 7years  PREVENT trial: [id number]  No toxicities to Study Drug; patient will need to be set up for another cardiac MRI since the expanders have been removed -------------------------------------------------------------------------------------------------------------------------------------------------------- Anastrozole Toxicities: No hot flashes or myalgias. Patient is tolerating anastrozole extremely well.  [name] clinical trial:Patient has been randomized to Palbociclib She will resume palbociclib today.  It was held previously for 28 days before her breast reconstruction  Palbociclib toxicities: 1. Fatigue grade 1 2. Neutropenia: Today's ANC is 3.2  Frequent urinary tract infections: Recent UA- revealed leukocytes in the urine but nitrite is negative and rare bacteria were noted.  Return to clinic in 3 months for follow-up and labs

## 2017-04-20 ENCOUNTER — Inpatient Hospital Stay (HOSPITAL_BASED_OUTPATIENT_CLINIC_OR_DEPARTMENT_OTHER): Payer: 59 | Admitting: Hematology and Oncology

## 2017-04-20 ENCOUNTER — Telehealth: Payer: Self-pay | Admitting: Hematology and Oncology

## 2017-04-20 ENCOUNTER — Encounter: Payer: Self-pay | Admitting: *Deleted

## 2017-04-20 ENCOUNTER — Inpatient Hospital Stay: Payer: 59

## 2017-04-20 DIAGNOSIS — C50411 Malignant neoplasm of upper-outer quadrant of right female breast: Secondary | ICD-10-CM

## 2017-04-20 DIAGNOSIS — Z9013 Acquired absence of bilateral breasts and nipples: Secondary | ICD-10-CM

## 2017-04-20 DIAGNOSIS — Z9221 Personal history of antineoplastic chemotherapy: Secondary | ICD-10-CM

## 2017-04-20 DIAGNOSIS — Z87440 Personal history of urinary (tract) infections: Secondary | ICD-10-CM

## 2017-04-20 DIAGNOSIS — Z006 Encounter for examination for normal comparison and control in clinical research program: Secondary | ICD-10-CM | POA: Diagnosis not present

## 2017-04-20 DIAGNOSIS — D701 Agranulocytosis secondary to cancer chemotherapy: Secondary | ICD-10-CM | POA: Diagnosis not present

## 2017-04-20 DIAGNOSIS — Z79811 Long term (current) use of aromatase inhibitors: Secondary | ICD-10-CM | POA: Diagnosis not present

## 2017-04-20 DIAGNOSIS — Z923 Personal history of irradiation: Secondary | ICD-10-CM | POA: Diagnosis not present

## 2017-04-20 DIAGNOSIS — Z170 Estrogen receptor positive status [ER+]: Principal | ICD-10-CM

## 2017-04-20 DIAGNOSIS — R530 Neoplastic (malignant) related fatigue: Secondary | ICD-10-CM | POA: Diagnosis not present

## 2017-04-20 DIAGNOSIS — Z90710 Acquired absence of both cervix and uterus: Secondary | ICD-10-CM | POA: Diagnosis not present

## 2017-04-20 LAB — COMPREHENSIVE METABOLIC PANEL
ALBUMIN: 4.1 g/dL (ref 3.5–5.0)
ALK PHOS: 78 U/L (ref 40–150)
ALT: 34 U/L (ref 0–55)
AST: 31 U/L (ref 5–34)
Anion gap: 6 (ref 3–11)
BILIRUBIN TOTAL: 0.5 mg/dL (ref 0.2–1.2)
BUN: 21 mg/dL (ref 7–26)
CALCIUM: 9.3 mg/dL (ref 8.4–10.4)
CO2: 30 mmol/L — ABNORMAL HIGH (ref 22–29)
Chloride: 106 mmol/L (ref 98–109)
Creatinine, Ser: 0.75 mg/dL (ref 0.60–1.10)
GFR calc Af Amer: >60 mL/min (ref >60)
GFR calc non Af Amer: >60 mL/min (ref >60)
GLUCOSE: 95 mg/dL (ref 70–140)
Potassium: 3.8 mmol/L (ref 3.5–5.1)
Sodium: 142 mmol/L (ref 136–145)
Total Protein: 6.9 g/dL (ref 6.4–8.3)

## 2017-04-20 LAB — CBC WITH DIFFERENTIAL (CANCER CENTER ONLY)
BASOS ABS: 0.1 10*3/uL (ref 0.0–0.1)
BASOS PCT: 3 %
EOS PCT: 2 %
Eosinophils Absolute: 0 10*3/uL (ref 0.0–0.5)
HCT: 32.8 % — ABNORMAL LOW (ref 34.8–46.6)
Hemoglobin: 11.4 g/dL — ABNORMAL LOW (ref 11.6–15.9)
LYMPHS PCT: 27 %
Lymphs Abs: 0.7 10*3/uL — ABNORMAL LOW (ref 0.9–3.3)
MCH: 35.9 pg — ABNORMAL HIGH (ref 25.1–34.0)
MCHC: 34.7 g/dL (ref 31.5–36.0)
MCV: 103.4 fL — AB (ref 79.5–101.0)
MONO ABS: 0.4 10*3/uL (ref 0.1–0.9)
Monocytes Relative: 14 %
Neutro Abs: 1.3 10*3/uL — ABNORMAL LOW (ref 1.5–6.5)
Neutrophils Relative %: 54 %
PLATELETS: 161 10*3/uL (ref 145–400)
RBC: 3.17 MIL/uL — ABNORMAL LOW (ref 3.70–5.45)
RDW: 17.7 % — AB (ref 11.2–14.5)
WBC Count: 2.4 10*3/uL — ABNORMAL LOW (ref 3.9–10.3)

## 2017-04-20 MED ORDER — INV-PALBOCICLIB 100 MG CAPS #23 ALLIANCE FOUNDATION AFT-05 (PALLAS): 100.0000 mg | ORAL_CAPSULE | Freq: Every day | ORAL | 0 refills | Status: DC

## 2017-04-20 NOTE — Telephone Encounter (Signed)
Gave avs and calendar ° °

## 2017-04-20 NOTE — Progress Notes (Signed)
04/20/17 at 4:50pm - [name] - cycle 9, day 1 study notes-The pt was into the cancer center this afternoon for her cycle 9 assessments.  The pt also returned her cycle 8 anti-hormone medication diary.  The pt's labs were drawn, and the pt's ANC value is 1.3 (grade 2) and WBC's is 2.4 (grade 2).  The pt's platelets are within normal limits. The pt was relieved to know that her cycle 9 can proceed today with a dose modification of palbociclib to 100 mg.  The pt reports the following AE's as ongoing:  fatigue (grade 1), arthralgia (grade 1), hot flashes (grade 2), and nausea (grade 1).   The pt said that her hip pain has resolved.  The pt was told that her urinalysis from 04/19/17 did not reveal an infection.  Dr. Lindi Adie felt that her symptoms were more "inflammatory".  He advised the pt to drink more juices that promote a healthy urinary system.  The pt was seen and examined by Dr. Lindi Adie.   The pt denies any changes in her medications. The pt will be seen in 12 weeks for her cycle 12 assessments.  The pt verbalized understanding. The pt was given her cycle 9-11 palbociclib and anti-hormone diaries to record her daily doses.  Dr. Lindi Adie reassured the pt that a reduced dose is still an effective dose.  The pt was dispensed 3 bottles of study drug assigned by the IRT system ([id number], [id number], and [phone]).   The pt was thanked for her continued support of this trial.  Brion Aliment RN, BSN, CCRP Clinical Research Nurse 04/20/2017 5:09 PM   Study/Protocol: [name]/AFT-05 Cycle:8 AE's from 04/15/17- 04/20/17             Event Grade Onset  Date Resolved Date Attribution to Palbociclib Attribution to anastrozole Treatment Comments  Left hip pain 1 03/21/17 04/20/17 No No Palbociclib/ anastrozole Pt injury from fall  White blood cell decreased 3 04/14/17 04/20/17 Yes  No Palbociclib/ anastrozole    Neutrophil count decreased 3 04/14/17 04/20/17 Yes  No Palbociclib/ anastrozole clinically significant- delayed cycle 9   Fatigue 1 09/16/16 Ongoing Yes  Yes Palbociclib/ anastrozole able to work  Platelet countdecreased 1 04/14/17 04/20/17 Yes No Palbociclib/ anastrozole Not clinically significant  Hot flashes 2 10/22/16 Ongoing  No  Yes Palbociclib/ anastrozole pt reports night sweats  Arthralgia  1 10/08/16 Ongoing No  Yes Palbociclib /  anastrozole   Nausea  1 11/01/16 Ongoing Yes  No Palbociclib / anastrozole     White blood cell decreased  2 04/20/17 Ongoing  Yes  No Palbociclib / anastrozole   Neutrophil Count decreased 2 04/20/17 Ongoing  Yes  No Palbociclib / anastrozole

## 2017-05-03 ENCOUNTER — Other Ambulatory Visit: Payer: Self-pay | Admitting: *Deleted

## 2017-05-03 DIAGNOSIS — Z170 Estrogen receptor positive status [ER+]: Principal | ICD-10-CM

## 2017-05-03 DIAGNOSIS — C50411 Malignant neoplasm of upper-outer quadrant of right female breast: Secondary | ICD-10-CM

## 2017-05-09 ENCOUNTER — Telehealth: Payer: Self-pay | Admitting: *Deleted

## 2017-05-09 NOTE — Telephone Encounter (Signed)
05/09/17 - The pt called the research nurse requesting a prescription of adderall because she reports some "brain fog" from her study drug.  The pt also reporting ongoing fatigue and nausea.  The pt was asked if she needs to come in for a MD evaluation/appt.  The pt denied the need to be seen.  She said that she just wants Dr. Lindi Adie to prescribe adderall for her.  She said that her co-worker was prescribed adderall by Dr. Denman George, and it has helped with her brain fog.  The research nurse discussed the pt's call and her medication request with Dr. Lindi Adie.  Dr. Lindi Adie said that he does not prescribe adderall to his patients.  He said the pt could ask her primary care physician for a prescription.  The pt was informed of Dr. Geralyn Flash decision to not prescribe adderall for her.  The pt was encouraged to call Dr. Geralyn Flash nurse if her nausea and fatigue worsen.  The pt was reminded that palbociclib has many side effects and some patients are very sensitive to this drug.  The pt was informed that she needs to call Dr. Lindi Adie for any further adverse events issues since her research nurse will be out of the office for 1 week.  The pt verbalized understanding.  Brion Aliment RN, BSN, CCRP Clinical Research Nurse 05/09/2017 11:05 AM

## 2017-05-16 MED FILL — VENLAFAXINE HCL ER 75 MG CA: 75 | 30 days supply | Qty: 30 | Fill #2

## 2017-05-19 DIAGNOSIS — N951 Menopausal and female climacteric states: Secondary | ICD-10-CM | POA: Diagnosis not present

## 2017-05-19 DIAGNOSIS — Z6828 Body mass index (BMI) 28.0-28.9, adult: Secondary | ICD-10-CM | POA: Diagnosis not present

## 2017-05-19 DIAGNOSIS — C50919 Malignant neoplasm of unspecified site of unspecified female breast: Secondary | ICD-10-CM | POA: Diagnosis not present

## 2017-05-19 DIAGNOSIS — R5383 Other fatigue: Secondary | ICD-10-CM | POA: Diagnosis not present

## 2017-05-19 DIAGNOSIS — R41840 Attention and concentration deficit: Secondary | ICD-10-CM | POA: Diagnosis not present

## 2017-05-19 MED FILL — BUPROPION HCL XL 150 MG TAB: 150 | 90 days supply | Qty: 90 | Fill #0

## 2017-06-02 DIAGNOSIS — Z0000 Encounter for general adult medical examination without abnormal findings: Secondary | ICD-10-CM | POA: Diagnosis not present

## 2017-06-06 ENCOUNTER — Encounter: Payer: Self-pay | Admitting: *Deleted

## 2017-06-06 NOTE — Progress Notes (Signed)
06/06/17 at 10:38am - The pt called the research nurse this morning.  She said that Dr. Ival Bible office called her last Friday and told her that she has some "critical lab values" noted on her most recent labs.  The pt said that Dr. Ival Bible office was supposed to contact Dr. Lindi Adie and send him a copy of the labs to review.  The research nurse and Dr. Lindi Adie could not locate the lab values for review.  The research nurse contacted Dr. Ival Bible office and asked them to fax the pt's labs again.  The research nurse and Dr. Lindi Adie reviewed the pt's labs.   He noted that her ANC was 1.2 and her platelets and hemoglobin were WNL.  He advised the research nurse to contact the pt and tell her to continue her treatment with palbociclib.  The pt was contacted at work, and she was told to continue her study drug with no dose interruptions or dose modifications.  The pt was relieved to hear that her labs were acceptable for continued treatment.  The pt reports that she is in cycle 10 now.  She said that she will stop for her "week off" on Thursday.  The pt denies any other symptoms.  The pt was encouraged to call Dr. Lindi Adie for any other concerns.  Brion Aliment RN, BSN, CCRP Clinical Research Nurse 06/06/2017 10:44 AM

## 2017-06-07 DIAGNOSIS — C50919 Malignant neoplasm of unspecified site of unspecified female breast: Secondary | ICD-10-CM | POA: Diagnosis not present

## 2017-06-07 DIAGNOSIS — M170 Bilateral primary osteoarthritis of knee: Secondary | ICD-10-CM | POA: Diagnosis not present

## 2017-06-07 DIAGNOSIS — N951 Menopausal and female climacteric states: Secondary | ICD-10-CM | POA: Diagnosis not present

## 2017-06-07 DIAGNOSIS — F411 Generalized anxiety disorder: Secondary | ICD-10-CM | POA: Diagnosis not present

## 2017-06-07 DIAGNOSIS — Z0000 Encounter for general adult medical examination without abnormal findings: Secondary | ICD-10-CM | POA: Diagnosis not present

## 2017-06-07 DIAGNOSIS — F331 Major depressive disorder, recurrent, moderate: Secondary | ICD-10-CM | POA: Diagnosis not present

## 2017-06-07 DIAGNOSIS — Z6828 Body mass index (BMI) 28.0-28.9, adult: Secondary | ICD-10-CM | POA: Diagnosis not present

## 2017-06-07 DIAGNOSIS — R5383 Other fatigue: Secondary | ICD-10-CM | POA: Diagnosis not present

## 2017-06-07 MED FILL — DICLOFENAC SODIUM 1% GEL: 1 | 30 days supply | Qty: 500 | Fill #0

## 2017-06-15 DIAGNOSIS — C50411 Malignant neoplasm of upper-outer quadrant of right female breast: Secondary | ICD-10-CM | POA: Diagnosis not present

## 2017-07-04 DIAGNOSIS — F411 Generalized anxiety disorder: Secondary | ICD-10-CM | POA: Diagnosis not present

## 2017-07-04 DIAGNOSIS — Z6828 Body mass index (BMI) 28.0-28.9, adult: Secondary | ICD-10-CM | POA: Diagnosis not present

## 2017-07-04 DIAGNOSIS — R5383 Other fatigue: Secondary | ICD-10-CM | POA: Diagnosis not present

## 2017-07-04 DIAGNOSIS — Z23 Encounter for immunization: Secondary | ICD-10-CM | POA: Diagnosis not present

## 2017-07-04 DIAGNOSIS — M1711 Unilateral primary osteoarthritis, right knee: Secondary | ICD-10-CM | POA: Diagnosis not present

## 2017-07-04 MED FILL — BUPROPION HCL XL 300 MG TAB: 300 | 90 days supply | Qty: 90 | Fill #0

## 2017-07-13 ENCOUNTER — Inpatient Hospital Stay: Payer: 59

## 2017-07-13 ENCOUNTER — Inpatient Hospital Stay: Payer: 59 | Attending: Hematology and Oncology | Admitting: Hematology and Oncology

## 2017-07-13 ENCOUNTER — Encounter: Payer: Self-pay | Admitting: *Deleted

## 2017-07-13 DIAGNOSIS — C50411 Malignant neoplasm of upper-outer quadrant of right female breast: Secondary | ICD-10-CM | POA: Insufficient documentation

## 2017-07-13 DIAGNOSIS — Z170 Estrogen receptor positive status [ER+]: Principal | ICD-10-CM

## 2017-07-13 DIAGNOSIS — D701 Agranulocytosis secondary to cancer chemotherapy: Secondary | ICD-10-CM | POA: Insufficient documentation

## 2017-07-13 DIAGNOSIS — Z9011 Acquired absence of right breast and nipple: Secondary | ICD-10-CM | POA: Insufficient documentation

## 2017-07-13 DIAGNOSIS — Z90710 Acquired absence of both cervix and uterus: Secondary | ICD-10-CM | POA: Insufficient documentation

## 2017-07-13 DIAGNOSIS — Z79811 Long term (current) use of aromatase inhibitors: Secondary | ICD-10-CM | POA: Insufficient documentation

## 2017-07-13 DIAGNOSIS — Z923 Personal history of irradiation: Secondary | ICD-10-CM | POA: Insufficient documentation

## 2017-07-13 DIAGNOSIS — Z90722 Acquired absence of ovaries, bilateral: Secondary | ICD-10-CM | POA: Insufficient documentation

## 2017-07-13 DIAGNOSIS — Z006 Encounter for examination for normal comparison and control in clinical research program: Secondary | ICD-10-CM | POA: Diagnosis not present

## 2017-07-13 LAB — CMP (CANCER CENTER ONLY)
ALK PHOS: 68 U/L (ref 38–126)
ALT: 20 U/L (ref 0–44)
ANION GAP: 9 (ref 5–15)
AST: 20 U/L (ref 15–41)
Albumin: 4.5 g/dL (ref 3.5–5.0)
BUN: 21 mg/dL — ABNORMAL HIGH (ref 6–20)
CALCIUM: 9.3 mg/dL (ref 8.9–10.3)
CO2: 26 mmol/L (ref 22–32)
Chloride: 105 mmol/L (ref 98–111)
Creatinine: 0.9 mg/dL (ref 0.44–1.00)
GFR, Estimated: >60 mL/min (ref >60)
GLUCOSE: 90 mg/dL (ref 70–99)
POTASSIUM: 3.8 mmol/L (ref 3.5–5.1)
SODIUM: 140 mmol/L (ref 135–145)
TOTAL PROTEIN: 7.2 g/dL (ref 6.5–8.1)
Total Bilirubin: 0.5 mg/dL (ref 0.3–1.2)

## 2017-07-13 LAB — HEMOGLOBIN A1C
Hgb A1c MFr Bld: 4.8 % (ref 4.8–5.6)
Mean Plasma Glucose: 91.06 mg/dL

## 2017-07-13 LAB — CBC WITH DIFFERENTIAL (CANCER CENTER ONLY)
BASOS PCT: 2 %
Basophils Absolute: 0 10*3/uL (ref 0.0–0.1)
Eosinophils Absolute: 0.1 10*3/uL (ref 0.0–0.5)
Eosinophils Relative: 2 %
HCT: 34.4 % — ABNORMAL LOW (ref 34.8–46.6)
Hemoglobin: 11.8 g/dL (ref 11.6–15.9)
Lymphocytes Relative: 26 %
Lymphs Abs: 0.6 10*3/uL — ABNORMAL LOW (ref 0.9–3.3)
MCH: 36 pg — AB (ref 25.1–34.0)
MCHC: 34.4 g/dL (ref 31.5–36.0)
MCV: 104.8 fL — AB (ref 79.5–101.0)
MONO ABS: 0.2 10*3/uL (ref 0.1–0.9)
Monocytes Relative: 11 %
NEUTROS ABS: 1.3 10*3/uL — AB (ref 1.5–6.5)
NEUTROS PCT: 59 %
PLATELETS: 125 10*3/uL — AB (ref 145–400)
RBC: 3.29 MIL/uL — AB (ref 3.70–5.45)
RDW: 13.5 % (ref 11.2–14.5)
WBC: 2.2 10*3/uL — AB (ref 3.9–10.3)

## 2017-07-13 MED ORDER — INV-PALBOCICLIB 100 MG CAPS #23 ALLIANCE FOUNDATION AFT-05 (PALLAS): 100.0000 mg | ORAL_CAPSULE | Freq: Every day | ORAL | 0 refills | Status: DC

## 2017-07-13 MED ORDER — ANASTROZOLE 1 MG PO TABS: 1.0000 mg | ORAL_TABLET | Freq: Every day | ORAL | 3 refills | Status: DC

## 2017-07-13 MED ORDER — BUPROPION HCL ER (XL) 300 MG PO TB24: 300.0000 mg | ORAL_TABLET | Freq: Every day | ORAL | Status: DC

## 2017-07-13 MED FILL — ANASTROZOLE 1 MG TABLET: 1 | 90 days supply | Qty: 90 | Fill #0

## 2017-07-13 NOTE — Progress Notes (Signed)
Patient Care Team: Fanny Bien, MD as PCP - General (Family Medicine) Rolm Bookbinder, MD as Consulting Physician (General Surgery) Nicholas Lose, MD as Consulting Physician (Hematology and Oncology) Delice Bison Charlestine Massed, NP as Nurse Practitioner (Hematology and Oncology) Kyung Rudd, MD as Consulting Physician (Radiation Oncology)  DIAGNOSIS:  Encounter Diagnosis  Name Primary?  . Malignant neoplasm of upper-outer quadrant of right breast in female, estrogen receptor positive ([place])     SUMMARY OF ONCOLOGIC HISTORY:   Breast cancer of upper-outer quadrant of right female breast ([place])   09/02/2015 Mammogram    Right breast UOQ: 2 masses 3 cm apart, U/S they measured 3.4 cm, larger mass at 1.6 cm, in addition, 1 cm mass at 9:00 and 0.9 cm mass at 8:30( could be intramammary LN); right axillary LN 11 mm; microcalcs 10 cm span       09/09/2015 Initial Diagnosis    Right breast biopsy OUQ: DCIS with calcs and necrosis ER 95%, PR 80%; 9:30 position 5cmfn: IDC with DCIS ER 95%, PR 65%, Ki-67 20%,; right biopsy 8:30 position 3cmfn ER 95%, PR 5%, Ki-67 10%: intramammary LN with IDC      09/12/2015 Breast MRI    Multicentric right breast cancer cluster of enhancing masses 4.1 x 2.2 x 2.6 cm, LOQ: 1.1 cm enhancing mass previously biopsied, subcutaneous low right breast 2 cm mass not biopsied, multiple other small enhancing masses, 1 cm right axillary lymph node      10/01/2015 Surgery    Right mastectomy: IDC grade 2, multifocal largest 2.5 cm, with high-grade DCIS, broadly present at  anterior margin and inferior margin 2/5 LN positive, LVI Present,  Left mastectomy: ALH, ER 95%, PR 5-65%, HER-2 negative, Ki-67 10-20%, T2 N1 a stage IIB      11/05/2015 - 03/17/2016 Chemotherapy    Adjuvant chemotherapy with dose dense Adriamycin and Cytoxan 4 followed by Abraxane weekly 12      04/22/2016 - 06/08/2016 Radiation Therapy    Adjuvant radiation therapy      06/22/2016 Surgery   Hysterectomy with bilateral salpingo-oophorectomy: Negative for malignancy      06/30/2016 -  Anti-estrogen oral therapy    Anastrozole 1 mg daily, patient is on a clinical trial [name] ( randomized to Palbociclib)       CHIEF COMPLIANT: Follow-up on anastrozole with palbociclib   INTERVAL HISTORY: Melanie Barber is a 52 year old with above-mentioned history of left breast cancer currently on clinical trial [name] and appears to be tolerating Ibrance very well with reduced dosage.  Fatigue has markedly improved.  Nausea is also markedly improved.  She is able to function better.  She was started on Wellbutrin for hot flashes and cloudiness in the head.  It appears that her symptoms have improved and she is able to function much better.  REVIEW OF SYSTEMS:   Constitutional: Denies fevers, chills or abnormal weight loss Eyes: Denies blurriness of vision Ears, nose, mouth, throat, and face: Denies mucositis or sore throat Respiratory: Denies cough, dyspnea or wheezes Cardiovascular: Denies palpitation, chest discomfort Gastrointestinal:  Denies nausea, heartburn or change in bowel habits Skin: Denies abnormal skin rashes Lymphatics: Denies new lymphadenopathy or easy bruising Neurological:Denies numbness, tingling or new weaknesses Behavioral/Psych: Mood is stable, no new changes  Extremities: No lower extremity edema Breast:  denies any pain or lumps or nodules in either breasts All other systems were reviewed with the patient and are negative.  I have reviewed the past medical history, past surgical history, social history  and family history with the patient and they are unchanged from previous note.  ALLERGIES:  is allergic to keflex [cephalexin] and decadron [dexamethasone].  MEDICATIONS:  Current Outpatient Medications  Medication Sig Dispense Refill  . anastrozole (ARIMIDEX) 1 MG tablet Take 1 tablet (1 mg total) by mouth daily. 90 tablet 3  . Atorvastatin Calcium  (INVESTIGATIONAL ATORVASTATIN/PLACEBO) 40 MG tablet [facility] [address] Take 1 tablet by mouth daily. Take 1 tablet daily with or without food. 180 tablet 0  . Biotin 10 MG TABS Take 10 mg by mouth daily.    Marland Kitchen buPROPion (WELLBUTRIN XL) 300 MG 24 hr tablet Take 1 tablet (300 mg total) by mouth daily.    . Calcium Carbonate-Vitamin D (CALCIUM 600+D PO) Take 1 tablet by mouth daily.    . Investigational palbociclib (IBRANCE) 100 MG capsule [facility] AFT-05 [name] Take 1 capsule (100 mg total) by mouth daily. Take with food. Swallow whole. Do not chew. Take on days 1-21. Repeat every 28 days. 63 capsule 0  . Investigational palbociclib (IBRANCE) 100 MG capsule [facility] AFT-05 [name] Take 1 capsule (100 mg total) by mouth daily. Take with food. Swallow whole. Do not chew. Take on days 1-21. Repeat every 28 days. 63 capsule 0  . Lactobacillus Rhamnosus, GG, (CULTURELLE) CAPS Take 1 capsule by mouth daily.    . Melatonin 5 MG CAPS Take 5 mg by mouth at bedtime as needed (sleep).    . Multiple Vitamin (MULTIVITAMIN) tablet Take 1 tablet by mouth daily.    . naproxen sodium (ANAPROX) 220 MG tablet Take 220-440 mg by mouth daily as needed (pain).     . ondansetron (ZOFRAN) 8 MG tablet Take 0.5 tablets (4 mg total) every 8 (eight) hours as needed by mouth for nausea or vomiting. (Patient taking differently: Take 4-8 mg by mouth every 8 (eight) hours as needed for nausea or vomiting. ) 30 tablet 0   No current facility-administered medications for this visit.     PHYSICAL EXAMINATION: ECOG PERFORMANCE STATUS: 1 - Symptomatic but completely ambulatory  Vitals:   07/13/17 1612  BP: 110/74  Pulse: 88  Resp: 20  Temp: 98.1 F (36.7 C)  SpO2: 98%   Filed Weights   07/13/17 1612  Weight: 175 lb 11.2 oz (79.7 kg)    GENERAL:alert, no distress and comfortable SKIN: skin color, texture, turgor are normal, no rashes or significant lesions EYES: normal, Conjunctiva are pink and  non-injected, sclera clear OROPHARYNX:no exudate, no erythema and lips, buccal mucosa, and tongue normal  NECK: supple, thyroid normal size, non-tender, without nodularity LYMPH:  no palpable lymphadenopathy in the cervical, axillary or inguinal LUNGS: clear to auscultation and percussion with normal breathing effort HEART: regular rate & rhythm and no murmurs and no lower extremity edema ABDOMEN:abdomen soft, non-tender and normal bowel sounds MUSCULOSKELETAL:no cyanosis of digits and no clubbing  NEURO: alert & oriented x 3 with fluent speech, no focal motor/sensory deficits EXTREMITIES: No lower extremity edema  LABORATORY DATA:  I have reviewed the data as listed CMP Latest Ref Rng & Units 07/13/2017 04/20/2017 04/14/2017  Glucose 70 - 99 mg/dL 90 95 98  BUN 6 - 20 mg/dL 21(H) 21 21  Creatinine 0.44 - 1.00 mg/dL 0.90 0.75 0.76  Sodium 135 - 145 mmol/L 140 142 140  Potassium 3.5 - 5.1 mmol/L 3.8 3.8 4.2  Chloride 98 - 111 mmol/L 105 106 106  CO2 22 - 32 mmol/L 26 30(H) 26  Calcium 8.9 - 10.3 mg/dL  9.3 9.3 9.6  Total Protein 6.5 - 8.1 g/dL 7.2 6.9 7.1  Total Bilirubin 0.3 - 1.2 mg/dL 0.5 0.5 0.6  Alkaline Phos 38 - 126 U/L 68 78 73  AST 15 - 41 U/L '20 31 20  '$ ALT 0 - 44 U/L 20 34 18    Lab Results  Component Value Date   WBC 2.2 (L) 07/13/2017   HGB 11.8 07/13/2017   HCT 34.4 (L) 07/13/2017   MCV 104.8 (H) 07/13/2017   PLT 125 (L) 07/13/2017   NEUTROABS 1.3 (L) 07/13/2017    ASSESSMENT & PLAN:  Breast cancer of upper-outer quadrant of right female breast ([place]) Bilateral mastectomies 10/01/2015 With reconstruction Right mastectomy: IDC grade 2, multifocal largest 2.5 cm, with high-grade DCIS, broadly present at anterior margin and inferior margin 2/5 LN positive, LVI Present,  Left mastectomy: ALH, ER 95%, PR 5-65%, HER-2 negative, Ki-67 10-20%,  Pathologic stage: T2 N1 a stage IIB Mammaprint: High risk CT-CAP and bone scan: No evidence of metastatic  disease ----------------------------------------------------------------------------------------------------------------------------------------------------------------- Treatment summary: 1. Adjuvant chemotherapy with dose dense Adriamycin and Cytoxan 4 followed by Abraxane weekly 12 started 11/05/2015 completed 03/17/2016 3. Followed by adjuvant radiation therapy Completed 06/08/2016 4. Followed by adjuvant antiestrogen therapy with anastrozole 7years  PREVENT trial: [id number]  No toxicities to Study Drug;patient will need to be set up for another cardiac MRI since the expanders have been removed -------------------------------------------------------------------------------------------------------------------------------------------------------- Anastrozole Toxicities: No hot flashes or myalgias. Patient is tolerating anastrozole extremely well.  [name] clinical trial:Patient has been randomized to Palbociclib currently on 100 mg daily   Palbociclib toxicities: 1. Fatigue  : Much improved with reduced dosage continue 2. Neutropenia: Today's ANC is1.3 patient can continue 100 mg dosage of palbociclib  Return to clinic in 3 months for follow-up    No orders of the defined types were placed in this encounter.  The patient has a good understanding of the overall plan. she agrees with it. she will call with any problems that may develop before the next visit here.   Harriette Ohara, MD 07/13/17

## 2017-07-13 NOTE — Progress Notes (Signed)
07/13/17 at 4:58pm- [name]/ AFT-05 cycle 12 study notes- The pt was into the cancer center this afternoon for her cycle 12 assessments. The pt did not return her cycle 9-11 study drug bottles.  The pt said that she left all of the bottles on her kitchen table.  She said all of the bottles were empty.  The research nurse agreed to go to the pt's work next week to pick up the bottles and take them to the pharmacy for the drug accountability check.  The pt also forgot to return her cycle 10-11 medication diaries (cycle 9 was previously mailed to the research nurse).  The pt confirmed that she has been 100% compliant with her study drug.  The pt's labs were drawn, and the pt's ANC value is 1.3 (grade 2) and WBC's is 2.2 (grade 2)   The pt's grade 1 low platelets are not clinically significant. The pt was informed that her cycle 12 can start today without any dose modifications. The pt reports the following AE's as ongoing:  fatigue (grade 1), arthralgia (grade 1), hot flashes (grade 2), and nausea (grade 1).  The pt stated that she developed some moderate "brain fog" on 05/09/17.  She said that she was seen by her PCP on 06/02/17, and she was started on Wellbutrin 300 mg for relief of her her hot flashes and cognitive issues. The pt said that her cognitive issues are resolved.  The pt said that it was either her dose reduction or the addition of the Wellbutrin which relieved her "brain fog".  The pt is working 50 hours a week and is performing all of her usual activities.  ECOG =1.  The pt was seen and examined by Dr. Lindi Adie.   The pt updated her current medication list. The pt said that she stopped her venlafaxine when she started her Wellbutrin on 06/02/17.  The pt also stated that she stopped her probiotic on 06/22/17 (3 weeks per pt report).  The research nurse added the following 2 medications that were started on 12/15/16:  Calcium carbonate +vitamin D and melatonin.   The pt will be seen in October 2019 for her  cycle 15 assessments.  The pt was given her cycle 12-14 palbociclib and anti-hormone diaries to record her daily doses. Dr. Lindi Adie was very happy that the pt has tolerated her palbociclib 100 mg dose reduction so well.  The pt was dispensed 3 bottles of study drug assigned by the IRT system ([id number], [id number], and [phone]).  The pt was thanked for her continued support of this trial. The pt's anastrozole prescription was also renewed today.  The pt stated that she would pick up her anastrozole this evening.    Brion Aliment RN, BSN, CCRP  Clinical Research Nurse 07/13/2017 5:09 PM   07/14/17 at 3:19pm - The research nurse and Dr. Lindi Adie spoke about the pt's reported "brain fog" and "cloudiness in the head" symptoms.  The physician reviewed several AE terms, and he felt that her symptoms were more reflective of a "cognitive disturbance".  He felt that it was a grade 2 AE that required medication for relief.  Dr. Lindi Adie stated that he felt that it was probably related to the pt's palbociclib and unrelated to her anastrozole therapy.   Brion Aliment RN, BSN, CCRP Clinical Research Nurse 07/14/2017 3:23 PM   07/18/17 at 11:35am- The pt called the research nurse this morning and stated that she brought her cycle 9-11 study drug bottles and her  cycle 10 and 11 drug diaries to work with her this morning.  The research nurse drove to [facility] and picked up the pt's completed diaries and study drug bottles.  The research nurse returned the empty study drug bottles to the pharmacy for the drug accountability check.   Brion Aliment RN, BSN, CCRP Clinical Research Nurse 07/18/2017 11:41 AM   Study/Protocol: [name]/AFT-05 Cycle:9-11AE'sfrom 04/21/17- 07/13/17            Event Grade Onset  Date Resolved Date Attribution to Palbociclib Attribution to anastrozole Treatment Comments  Fatigue 1 09/16/16 Ongoing Yes  Yes Palbociclib/ anastrozole   Platelet countdecreased 1 07/13/17 Ongoing Yes No  Palbociclib/ anastrozole Not clinically significant  Hot flashes 2 10/22/16 Ongoing  No  Yes Palbociclib/ anastrozole   Arthralgia  1 10/08/16 Ongoing No  Yes Palbociclib / anastrozole   Nausea  1 11/01/16 Ongoing Yes  No Palbociclib/ anastrozole     White blood cell decreased  2 04/20/17 Ongoing  Yes  No Palbociclib/ anastrozole   Neutrophil Count decreased 2 04/20/17 Ongoing  Yes  No Palbociclib/ anastrozole   Cognitive disturbance 2 05/09/17 07/13/17 Yes No Palbociclib / anastrozole

## 2017-07-13 NOTE — Assessment & Plan Note (Signed)
Bilateral mastectomies 10/01/2015 With reconstruction Right mastectomy: IDC grade 2, multifocal largest 2.5 cm, with high-grade DCIS, broadly present at anterior margin and inferior margin 2/5 LN positive, LVI Present,  Left mastectomy: ALH, ER 95%, PR 5-65%, HER-2 negative, Ki-67 10-20%,  Pathologic stage: T2 N1 a stage IIB Mammaprint: High risk CT-CAP and bone scan: No evidence of metastatic disease ----------------------------------------------------------------------------------------------------------------------------------------------------------------- Treatment summary: 1. Adjuvant chemotherapy with dose dense Adriamycin and Cytoxan 4 followed by Abraxane weekly 12 started 11/05/2015 completed 03/17/2016 3. Followed by adjuvant radiation therapy Completed 06/08/2016 4. Followed by adjuvant antiestrogen therapy with anastrozole 7years  PREVENT trial: [id number] 98213  No toxicities to Study Drug;patient will need to be set up for another cardiac MRI since the expanders have been removed -------------------------------------------------------------------------------------------------------------------------------------------------------- Anastrozole Toxicities: No hot flashes or myalgias. Patient is tolerating anastrozole extremely well.  [name] clinical trial:Patient has been randomized to Palbociclib She will resume palbociclib today. It was held previously for 28 days before her breast reconstruction  Palbociclib toxicities: 1. Fatigue grade 1-2 2. Neutropenia: Today's ANC is1.3 patient can initiate 100 mg dosage of palbociclib We had to convince the patient that the dose reduction is necessary.  Frequent urinary tract infections  Return to clinic in 3 months for follow-up

## 2017-07-14 ENCOUNTER — Telehealth: Payer: Self-pay | Admitting: Hematology and Oncology

## 2017-07-14 NOTE — Telephone Encounter (Signed)
Mailed patient calendar of upcoming October appts.

## 2017-07-20 DIAGNOSIS — Z923 Personal history of irradiation: Secondary | ICD-10-CM | POA: Diagnosis not present

## 2017-07-20 DIAGNOSIS — Z853 Personal history of malignant neoplasm of breast: Secondary | ICD-10-CM | POA: Diagnosis not present

## 2017-07-21 ENCOUNTER — Other Ambulatory Visit: Payer: Self-pay | Admitting: Hematology and Oncology

## 2017-07-21 DIAGNOSIS — Z006 Encounter for examination for normal comparison and control in clinical research program: Secondary | ICD-10-CM

## 2017-07-22 ENCOUNTER — Ambulatory Visit: Payer: Self-pay | Admitting: Nurse Practitioner

## 2017-07-22 ENCOUNTER — Telehealth: Payer: Self-pay | Admitting: Hematology and Oncology

## 2017-07-22 VITALS — BP 112/86 | HR 81 | Temp 97.9°F | Resp 18 | Wt 177.2 lb

## 2017-07-22 DIAGNOSIS — M5432 Sciatica, left side: Secondary | ICD-10-CM

## 2017-07-22 MED ORDER — CYCLOBENZAPRINE HCL 10 MG PO TABS: 10.0000 mg | ORAL_TABLET | Freq: Three times a day (TID) | ORAL | 0 refills | Status: AC | PRN

## 2017-07-22 MED ORDER — PREDNISONE 10 MG (21) PO TBPK: ORAL_TABLET | ORAL | 0 refills | Status: AC

## 2017-07-22 MED ORDER — KETOROLAC TROMETHAMINE 30 MG/ML IJ SOLN
30.0000 mg | Freq: Once | INTRAMUSCULAR | Status: AC
  Administered 2017-07-22: 30 mg via INTRAMUSCULAR

## 2017-07-22 NOTE — Telephone Encounter (Signed)
Mailed patient calendar of upcoming oct appts per 7/18 sch message

## 2017-07-22 NOTE — Progress Notes (Signed)
   Subjective:    Patient ID: Melanie Barber, female    DOB: 09/07/1965, 52 y.o.   MRN: [id number]  The patient is a 52 year old female who presents with left sciatic nerve pain.  The patient states her symptoms started today.  The patient complains of pain to the left lower back with radiation into the lower leg, with inability to sit and bend over.  Patient states that her pain right now is about a 8 out of a 10.  Patient has not taken anything for her symptoms today.  The patient states she has been standing up at work all day because the pain is too intense when she sits down.  The patient states she does have a history of of sciatica, and is normally given prednisone injections and a muscle relaxer.  The patient denies loss of bowel or bladder function, muscle weakness, or numbness or tingling in her lower extremities.  Back Pain  This is a recurrent problem. The problem occurs constantly. The problem is unchanged. The pain is present in the lumbar spine. The quality of the pain is described as burning. The pain radiates to the left thigh. The pain is at a severity of 8/10. The pain is moderate. The pain is the same all the time. The symptoms are aggravated by bending and standing. Stiffness is present all day. Associated symptoms include leg pain (left). Pertinent negatives include no fever, numbness, paresis, perianal numbness, tingling or weakness. Risk factors include history of cancer (breast CA). She has tried nothing for the symptoms.    Review of Systems  Constitutional: Positive for activity change. Negative for fatigue and fever.  HENT: Negative.   Respiratory: Negative.   Cardiovascular: Negative.   Musculoskeletal: Positive for back pain.  Neurological: Negative for tingling, weakness and numbness.       Objective:   Physical Exam  Constitutional: She is oriented to person, place, and time. She appears well-developed and well-nourished. She appears distressed (due to sciatic  pain).  HENT:  Head: Normocephalic and atraumatic.  Eyes: Pupils are equal, round, and reactive to light. EOM are normal.  Neck: Normal range of motion. Neck supple.  Cardiovascular: Normal rate, regular rhythm and normal heart sounds.  Pulmonary/Chest: Effort normal and breath sounds normal. No respiratory distress.  Abdominal: Soft. Bowel sounds are normal. She exhibits no distension. There is no tenderness.  Musculoskeletal: She exhibits tenderness (point tenderness to left sciatic nerve,left gluteus maximus). She exhibits no edema or deformity.  Unable to bend forward and difficulty sitting.  Neurological: She is alert and oriented to person, place, and time.  Skin: Skin is warm and dry.  Psychiatric: She has a normal mood and affect.  Vitals reviewed.     Assessment & Plan:  Left Sciatic Nerve Pain  Exam findings, diagnosis etiology and medication use and indications reviewed with patient. Follow- Up and discharge instructions provided. No emergent/urgent issues found on exam.  Patient verbalized understanding of information provided and agrees with plan of care (POC), all questions answered.  1. Left sciatic nerve pain - ketorolac (TORADOL) 30 MG/ML injection 30 mg - predniSONE (STERAPRED UNI-PAK 21 TAB) 10 MG (21) TBPK tablet; Take as directed.  Dispense: 21 tablet; Refill: 0 - cyclobenzaprine (FLEXERIL) 10 MG tablet; Take 1 tablet (10 mg total) by mouth 3 (three) times daily as needed for up to 10 days for muscle spasms.  Dispense: 30 tablet; Refill: 0

## 2017-07-22 NOTE — Patient Instructions (Signed)
Sciatica Sciatica is pain, numbness, weakness, or tingling along the path of the sciatic nerve. The sciatic nerve starts in the lower back and runs down the back of each leg. The nerve controls the muscles in the lower leg and in the back of the knee. It also provides feeling (sensation) to the back of the thigh, the lower leg, and the sole of the foot. Sciatica is a symptom of another medical condition that pinches or puts pressure on the sciatic nerve. Generally, sciatica only affects one side of the body. Sciatica usually goes away on its own or with treatment. In some cases, sciatica may keep coming back (recur). What are the causes? This condition is caused by pressure on the sciatic nerve, or pinching of the sciatic nerve. This may be the result of:  A disk in between the bones of the spine (vertebrae) bulging out too far (herniated disk).  Age-related changes in the spinal disks (degenerative disk disease).  A pain disorder that affects a muscle in the buttock (piriformis syndrome).  Extra bone growth (bone spur) near the sciatic nerve.  An injury or break (fracture) of the pelvis.  Pregnancy.  Tumor (rare). What increases the risk? The following factors may make you more likely to develop this condition:  Playing sports that place pressure or stress on the spine, such as football or weight lifting.  Having poor strength and flexibility.  A history of back injury.  A history of back surgery.  Sitting for long periods of time.  Doing activities that involve repetitive bending or lifting.  Obesity. What are the signs or symptoms? Symptoms can vary from mild to very severe, and they may include:  Any of these problems in the lower back, leg, hip, or buttock:  Mild tingling or dull aches.  Burning sensations.  Sharp pains.  Numbness in the back of the calf or the sole of the foot.  Leg weakness.  Severe back pain that makes movement difficult. These symptoms may  get worse when you cough, sneeze, or laugh, or when you sit or stand for long periods of time. Being overweight may also make symptoms worse. In some cases, symptoms may recur over time. How is this diagnosed? This condition may be diagnosed based on:  Your symptoms.  A physical exam. Your health care provider may ask you to do certain movements to check whether those movements trigger your symptoms.  You may have tests, including:  Blood tests.  X-rays.  MRI.  CT scan. How is this treated? In many cases, this condition improves on its own, without any treatment. However, treatment may include:  Reducing or modifying physical activity during periods of pain.  Exercising and stretching to strengthen your abdomen and improve the flexibility of your spine.  Icing and applying heat to the affected area.  Medicines that help:  To relieve pain and swelling.  To relax your muscles.  Injections of medicines that help to relieve pain, irritation, and inflammation around the sciatic nerve (steroids).  Surgery. Follow these instructions at home: Medicines   Take over-the-counter and prescription medicines only as told by your health care provider.  Do not drive or operate heavy machinery while taking prescription pain medicine. Managing pain   If directed, apply ice to the affected area.  Put ice in a plastic bag.  Place a towel between your skin and the bag.  Leave the ice on for 20 minutes, 2-3 times a day.  After icing, apply heat to the   heat to the affected area before you exercise or as often as told by your health care provider. Use the heat source that your health care provider recommends, such as a moist heat pack or a heating pad. ? Place a towel between your skin and the heat source. ? Leave the heat on for 20-30 minutes. ? Remove the heat if your skin turns bright red. This is especially important if you are unable to feel pain, heat, or cold. You may have a  greater risk of getting burned. Activity  Return to your normal activities as told by your health care provider. Ask your health care provider what activities are safe for you. ? Avoid activities that make your symptoms worse.  Take brief periods of rest throughout the day. Resting in a lying or standing position is usually better than sitting to rest. ? When you rest for longer periods, mix in some mild activity or stretching between periods of rest. This will help to prevent stiffness and pain. ? Avoid sitting for long periods of time without moving. Get up and move around at least one time each hour.  Exercise and stretch regularly, as told by your health care provider.  Do not lift anything that is heavier than 10 lb (4.5 kg) while you have symptoms of sciatica. When you do not have symptoms, you should still avoid heavy lifting, especially repetitive heavy lifting.  When you lift objects, always use proper lifting technique, which includes: ? Bending your knees. ? Keeping the load close to your body. ? Avoiding twisting. General instructions  Use good posture. ? Avoid leaning forward while sitting. ? Avoid hunching over while standing.  Maintain a healthy weight. Excess weight puts extra stress on your back and makes it difficult to maintain good posture.  Wear supportive, comfortable shoes. Avoid wearing high heels.  Avoid sleeping on a mattress that is too soft or too hard. A mattress that is firm enough to support your back when you sleep may help to reduce your pain.  Keep all follow-up visits as told by your health care provider. This is important. Contact a health care provider if:  You have pain that wakes you up when you are sleeping.  You have pain that gets worse when you lie down.  Your pain is worse than you have experienced in the past.  Your pain lasts longer than 4 weeks.  You experience unexplained weight loss. Get help right away if:  You lose control  of your bowel or bladder (incontinence).  You have: ? Weakness in your lower back, pelvis, buttocks, or legs that gets worse. ? Redness or swelling of your back. ? A burning sensation when you urinate. This information is not intended to replace advice given to you by your health care provider. Make sure you discuss any questions you have with your health care provider. Document Released: 12/15/2000 Document Revised: 05/27/2015 Document Reviewed: 08/30/2014 Elsevier Interactive Patient Education  2018 [employer].  Sciatica Rehab Ask your health care provider which exercises are safe for you. Do exercises exactly as told by your health care provider and adjust them as directed. It is normal to feel mild stretching, pulling, tightness, or discomfort as you do these exercises, but you should stop right away if you feel sudden pain or your pain gets worse.Do not begin these exercises until told by your health care provider. Stretching and range of motion exercises These exercises warm up your muscles and joints and improve the movement  and flexibility of your hips and your back. These exercises also help to relieve pain, numbness, and tingling. Exercise A: Sciatic nerve glide 1. Sit in a chair with your head facing down toward your chest. Place your hands behind your back. Let your shoulders slump forward. 2. Slowly straighten one of your knees while you tilt your head back as if you are looking toward the ceiling. Only straighten your leg as far as you can without making your symptoms worse. 3. Hold for __________ seconds. 4. Slowly return to the starting position. 5. Repeat with your other leg. Repeat __________ times. Complete this exercise __________ times a day. Exercise B: Knee to chest with hip adduction and internal rotation  1. Lie on your back on a firm surface with both legs straight. 2. Bend one of your knees and move it up toward your chest until you feel a gentle stretch in your  lower back and buttock. Then, move your knee toward the shoulder that is on the opposite side from your leg. ? Hold your leg in this position by holding onto the front of your knee. 3. Hold for __________ seconds. 4. Slowly return to the starting position. 5. Repeat with your other leg. Repeat __________ times. Complete this exercise __________ times a day. Exercise C: Prone extension on elbows  1. Lie on your abdomen on a firm surface. A bed may be too soft for this exercise. 2. Prop yourself up on your elbows. 3. Use your arms to help lift your chest up until you feel a gentle stretch in your abdomen and your lower back. ? This will place some of your body weight on your elbows. If this is uncomfortable, try stacking pillows under your chest. ? Your hips should stay down, against the surface that you are lying on. Keep your hip and back muscles relaxed. 4. Hold for __________ seconds. 5. Slowly relax your upper body and return to the starting position. Repeat __________ times. Complete this exercise __________ times a day. Strengthening exercises These exercises build strength and endurance in your back. Endurance is the ability to use your muscles for a long time, even after they get tired. Exercise D: Pelvic tilt 1. Lie on your back on a firm surface. Bend your knees and keep your feet flat. 2. Tense your abdominal muscles. Tip your pelvis up toward the ceiling and flatten your lower back into the floor. ? To help with this exercise, you may place a small towel under your lower back and try to push your back into the towel. 3. Hold for __________ seconds. 4. Let your muscles relax completely before you repeat this exercise. Repeat __________ times. Complete this exercise __________ times a day. Exercise E: Alternating arm and leg raises  1. Get on your hands and knees on a firm surface. If you are on a hard floor, you may want to use padding to cushion your knees, such as an exercise  mat. 2. Line up your arms and legs. Your hands should be below your shoulders, and your knees should be below your hips. 3. Lift your left leg behind you. At the same time, raise your right arm and straighten it in front of you. ? Do not lift your leg higher than your hip. ? Do not lift your arm higher than your shoulder. ? Keep your abdominal and back muscles tight. ? Keep your hips facing the ground. ? Do not arch your back. ? Keep your balance carefully, and do not  hold your breath. 4. Hold for __________ seconds. 5. Slowly return to the starting position and repeat with your right leg and your left arm. Repeat __________ times. Complete this exercise __________ times a day. Posture and body mechanics  Body mechanics refers to the movements and positions of your body while you do your daily activities. Posture is part of body mechanics. Good posture and healthy body mechanics can help to relieve stress in your body's tissues and joints. Good posture means that your spine is in its natural S-curve position (your spine is neutral), your shoulders are pulled back slightly, and your head is not tipped forward. The following are general guidelines for applying improved posture and body mechanics to your everyday activities. Standing   When standing, keep your spine neutral and your feet about hip-width apart. Keep a slight bend in your knees. Your ears, shoulders, and hips should line up.  When you do a task in which you stand in one place for a long time, place one foot up on a stable object that is 2-4 inches (5-10 cm) high, such as a footstool. This helps keep your spine neutral. Sitting   When sitting, keep your spine neutral and keep your feet flat on the floor. Use a footrest, if necessary, and keep your thighs parallel to the floor. Avoid rounding your shoulders, and avoid tilting your head forward.  When working at a desk or a computer, keep your desk at a height where your hands are  slightly lower than your elbows. Slide your chair under your desk so you are close enough to maintain good posture.  When working at a computer, place your monitor at a height where you are looking straight ahead and you do not have to tilt your head forward or downward to look at the screen. Resting   When lying down and resting, avoid positions that are most painful for you.  If you have pain with activities such as sitting, bending, stooping, or squatting (flexion-based activities), lie in a position in which your body does not bend very much. For example, avoid curling up on your side with your arms and knees near your chest (fetal position).  If you have pain with activities such as standing for a long time or reaching with your arms (extension-based activities), lie with your spine in a neutral position and bend your knees slightly. Try the following positions: ? Lying on your side with a pillow between your knees. ? Lying on your back with a pillow under your knees. Lifting   When lifting objects, keep your feet at least shoulder-width apart and tighten your abdominal muscles.  Bend your knees and hips and keep your spine neutral. It is important to lift using the strength of your legs, not your back. Do not lock your knees straight out.  Always ask for help to lift heavy or awkward objects. This information is not intended to replace advice given to you by your health care provider. Make sure you discuss any questions you have with your health care provider. Document Released: 12/21/2004 Document Revised: 08/28/2015 Document Reviewed: 09/06/2014 Elsevier Interactive Patient Education  [employer].

## 2017-07-28 ENCOUNTER — Telehealth (HOSPITAL_COMMUNITY): Payer: Self-pay | Admitting: Vascular Surgery

## 2017-07-28 NOTE — Telephone Encounter (Signed)
Left pt message to make 6 mon brst appt w/ echo w/ DB in Oct

## 2017-09-15 ENCOUNTER — Other Ambulatory Visit: Payer: Self-pay | Admitting: Family Medicine

## 2017-09-15 ENCOUNTER — Ambulatory Visit
Admission: RE | Admit: 2017-09-15 | Discharge: 2017-09-15 | Disposition: A | Discharge status: Home / Self Care | Payer: 59 | Source: Ambulatory Visit | Attending: Family Medicine | Admitting: Family Medicine

## 2017-09-15 DIAGNOSIS — E70319 Ocular albinism, unspecified: Secondary | ICD-10-CM

## 2017-09-15 DIAGNOSIS — M170 Bilateral primary osteoarthritis of knee: Secondary | ICD-10-CM | POA: Diagnosis not present

## 2017-10-05 ENCOUNTER — Encounter: Payer: Self-pay | Admitting: *Deleted

## 2017-10-05 ENCOUNTER — Inpatient Hospital Stay: Payer: 59

## 2017-10-05 ENCOUNTER — Inpatient Hospital Stay: Payer: 59 | Admitting: *Deleted

## 2017-10-05 ENCOUNTER — Ambulatory Visit (HOSPITAL_COMMUNITY)
Admission: RE | Admit: 2017-10-05 | Discharge: 2017-10-05 | Disposition: A | Discharge status: Home / Self Care | Payer: 59 | Source: Ambulatory Visit | Attending: Hematology and Oncology | Admitting: Hematology and Oncology

## 2017-10-05 ENCOUNTER — Inpatient Hospital Stay: Payer: 59 | Attending: Hematology and Oncology | Admitting: Hematology and Oncology

## 2017-10-05 DIAGNOSIS — Z9013 Acquired absence of bilateral breasts and nipples: Secondary | ICD-10-CM | POA: Diagnosis not present

## 2017-10-05 DIAGNOSIS — Z90710 Acquired absence of both cervix and uterus: Secondary | ICD-10-CM | POA: Diagnosis not present

## 2017-10-05 DIAGNOSIS — Z923 Personal history of irradiation: Secondary | ICD-10-CM

## 2017-10-05 DIAGNOSIS — Z170 Estrogen receptor positive status [ER+]: Principal | ICD-10-CM

## 2017-10-05 DIAGNOSIS — Z006 Encounter for examination for normal comparison and control in clinical research program: Secondary | ICD-10-CM | POA: Diagnosis not present

## 2017-10-05 DIAGNOSIS — C50411 Malignant neoplasm of upper-outer quadrant of right female breast: Secondary | ICD-10-CM

## 2017-10-05 DIAGNOSIS — Z79811 Long term (current) use of aromatase inhibitors: Secondary | ICD-10-CM

## 2017-10-05 DIAGNOSIS — Z9221 Personal history of antineoplastic chemotherapy: Secondary | ICD-10-CM

## 2017-10-05 LAB — CBC WITH DIFFERENTIAL (CANCER CENTER ONLY)
BASOS ABS: 0.1 10*3/uL (ref 0.0–0.1)
Basophils Relative: 3 %
EOS ABS: 0 10*3/uL (ref 0.0–0.5)
EOS PCT: 1 %
HCT: 36.5 % (ref 34.8–46.6)
Hemoglobin: 12.7 g/dL (ref 11.6–15.9)
LYMPHS ABS: 0.5 10*3/uL — AB (ref 0.9–3.3)
LYMPHS PCT: 26 %
MCH: 37 pg — ABNORMAL HIGH (ref 25.1–34.0)
MCHC: 34.8 g/dL (ref 31.5–36.0)
MCV: 106.4 fL — AB (ref 79.5–101.0)
Monocytes Absolute: 0.3 10*3/uL (ref 0.1–0.9)
Monocytes Relative: 14 %
NEUTROS PCT: 56 %
Neutro Abs: 1.1 10*3/uL — ABNORMAL LOW (ref 1.5–6.5)
PLATELETS: 133 10*3/uL — AB (ref 145–400)
RBC: 3.43 MIL/uL — AB (ref 3.70–5.45)
RDW: 13.7 % (ref 11.2–14.5)
WBC: 1.9 10*3/uL — AB (ref 3.9–10.3)

## 2017-10-05 LAB — CMP (CANCER CENTER ONLY)
ALT: 23 U/L (ref 0–44)
AST: 21 U/L (ref 15–41)
Albumin: 4.4 g/dL (ref 3.5–5.0)
Alkaline Phosphatase: 72 U/L (ref 38–126)
Anion gap: 9 (ref 5–15)
BILIRUBIN TOTAL: 0.8 mg/dL (ref 0.3–1.2)
BUN: 12 mg/dL (ref 6–20)
CHLORIDE: 105 mmol/L (ref 98–111)
CO2: 27 mmol/L (ref 22–32)
Calcium: 9.3 mg/dL (ref 8.9–10.3)
Creatinine: 0.88 mg/dL (ref 0.44–1.00)
GFR, Estimated: >60 mL/min (ref >60)
Glucose, Bld: 83 mg/dL (ref 70–99)
POTASSIUM: 3.8 mmol/L (ref 3.5–5.1)
Sodium: 141 mmol/L (ref 135–145)
TOTAL PROTEIN: 7.2 g/dL (ref 6.5–8.1)

## 2017-10-05 LAB — RESEARCH LABS

## 2017-10-05 MED ORDER — INV-PALBOCICLIB 100 MG CAPS #23 ALLIANCE FOUNDATION AFT-05 (PALLAS): 100.0000 mg | ORAL_CAPSULE | Freq: Every day | ORAL | 0 refills | Status: DC

## 2017-10-05 NOTE — Progress Notes (Signed)
10/05/17 at 1:22pm- PREVENT month 24 study notes -Patient presented to cancer center unaccompanied after completion of her cardiac MRI.  Vilinda Blanks at [facility] was notified of the pt's scan.  The research nurse contacted [facility] and informed them that nurse was not able to enter pt's scan in [facility].  The issue is still pending.  However, nurse gave the information to Myrle Sheng at [facility] to enter the data since the system is not working properly.  Research labs were drawn and V/S were recorded.  Waist measurement 38.5 inches, and patient denied peripheral edema. ECOG = "1".  Pt is working full time and performing all of her usual activities with some mild limitations.  24 month booklet was given to patient and this was completed, followed by neurocognitive testing per Remer Macho, research specialist. Current medication list reviewed with the pt, and the research nurse updated the pt's current medication.  Last dose of atorvastatin/placebo was 10/04/17 at 6:30pm. She denies any difficulty swallowing pills or adverse events from study medication (no URI, increase in H/A, abdominal pain, myalgia, liver function changes, confusion or s/s diabetes). She returned her study drug bottle with #9 pills remaining, and this was confirmed with Romualdo Bolk, PharmD when bottle returned to pharmacy. The pt's 6 month compliance is 98.3%.  Medication diaries for September and October collected from patient. She was provided her Study Completion Certificate and thanked for her participation in the study for the last 24 months. Informed her that research nurse will be calling her in 1 month for final contact regarding adverse events from this study.   Brion Aliment RN, BSN, CCRP  Clinical Research Nurse 10/05/2017 4:16 PM   10/07/17- The MRI Encounter form was faxed to the [place] reading room on 10/06/17.  The pt's month 24 lab values from [place] is still pending.  The research nurse will forward the month 24 packet to [facility] once the site  receives the lab values.  Brion Aliment RN, BSN, CCRP Clinical Research Nurse 10/07/2017 11:33 AM

## 2017-10-05 NOTE — Progress Notes (Signed)
10/05/17 at 4:23pm [name]/ AFT-05 cycle 15 study notes- The pt was into the cancer center this afternoon for her cycle15 assessments.  Research nurse informed the pt that there was a new consent form version 6.1 dated 07/13/17.  The pt read the consent form and the research nurse reviewed the new changes with the pt. The pt said that she was comfortable continuing her participation in the study.  The pt signed the consent form, and the pt was given a copy of her signed forms for her records.  The pt signed the consent form at 2:35pm.  The pt did return her cycle 12-14 study drug bottles.  The bottles were empty. The research nurse took the empty bottles to the pharmacy for the drug accountability check with Romualdo Bolk, pharmacist. The pt returned her cycle 14 medication diaries (cycle 12 and 13 were previously mailed to the research nurse). The pt confirmed that she has been 100% compliant with her study drug.  The pt's labs were drawn, and the pt's ANC value is 1.1 (grade 2)and WBC's is 1.9 (grade 3)  The pt's grade 1 low platelets are not clinically significant. The pt was informed that her cycle15 can start today without any dose modifications. The pt reports the following AE's as ongoing: fatigue (grade 1), arthralgia (grade 1), hot flashes (grade 2), and nausea (grade 1).  The pt was seen and examined by Dr. Lindi Adie.The pt denies any new adverse events. The pt updated her current medication list.  The pt was given her cycle 15-17 palbociclib andanti-hormone diaries to record her daily doses. The pt was dispensed 3 bottles of study drug assigned by the IRT system ([id number], [id number] , and [id number]). The pt was thanked for her continued support of this trial. The pt will be called later about her cycle 18 appts.   Brion Aliment RN, BSN, CCRP Clinical Research Nurse 10/05/2017 4:44 PM

## 2017-10-05 NOTE — Assessment & Plan Note (Signed)
Bilateral mastectomies 10/01/2015 With reconstruction Right mastectomy: IDC grade 2, multifocal largest 2.5 cm, with high-grade DCIS, broadly present at anterior margin and inferior margin 2/5 LN positive, LVI Present,  Left mastectomy: ALH, ER 95%, PR 5-65%, HER-2 negative, Ki-67 10-20%,  Pathologic stage: T2 N1 a stage IIB Mammaprint: High risk CT-CAP and bone scan: No evidence of metastatic disease ----------------------------------------------------------------------------------------------------------------------------------------------------------------- Treatment summary: 1. Adjuvant chemotherapy with dose dense Adriamycin and Cytoxan 4 followed by Abraxane weekly 12 started 11/05/2015 completed 03/17/2016 3. Followed by adjuvant radiation therapy Completed 06/08/2016 4. Followed by adjuvant antiestrogen therapy with anastrozole 7years  PREVENT trial: [id number]  No toxicities to Study drug -------------------------------------------------------------------------------------------------------------------------------------------------------- Anastrozole Toxicities: No hot flashes or myalgias. Patient is tolerating anastrozole extremely well.  [name] clinical trial:Patient has been randomized to Palbociclib currently on 100 mg daily   Palbociclib toxicities: 1. Fatigue: Much improved with reduced dosage continue 2. Neutropenia: Today's ANC is patient can continue 100 mg dosage of palbociclib  Return to clinic in 3 months for follow-up

## 2017-10-05 NOTE — Progress Notes (Signed)
Patient Care Team: Fanny Bien, MD as PCP - General (Family Medicine) Rolm Bookbinder, MD as Consulting Physician (General Surgery) Nicholas Lose, MD as Consulting Physician (Hematology and Oncology) Delice Bison Charlestine Massed, NP as Nurse Practitioner (Hematology and Oncology) Kyung Rudd, MD as Consulting Physician (Radiation Oncology)  DIAGNOSIS:  Encounter Diagnosis  Name Primary?  . Malignant neoplasm of upper-outer quadrant of right breast in female, estrogen receptor positive ([place])     SUMMARY OF ONCOLOGIC HISTORY:   Breast cancer of upper-outer quadrant of right female breast ([place])   09/02/2015 Mammogram    Right breast UOQ: 2 masses 3 cm apart, U/S they measured 3.4 cm, larger mass at 1.6 cm, in addition, 1 cm mass at 9:00 and 0.9 cm mass at 8:30( could be intramammary LN); right axillary LN 11 mm; microcalcs 10 cm span     09/09/2015 Initial Diagnosis    Right breast biopsy OUQ: DCIS with calcs and necrosis ER 95%, PR 80%; 9:30 position 5cmfn: IDC with DCIS ER 95%, PR 65%, Ki-67 20%,; right biopsy 8:30 position 3cmfn ER 95%, PR 5%, Ki-67 10%: intramammary LN with IDC    09/12/2015 Breast MRI    Multicentric right breast cancer cluster of enhancing masses 4.1 x 2.2 x 2.6 cm, LOQ: 1.1 cm enhancing mass previously biopsied, subcutaneous low right breast 2 cm mass not biopsied, multiple other small enhancing masses, 1 cm right axillary lymph node    10/01/2015 Surgery    Right mastectomy: IDC grade 2, multifocal largest 2.5 cm, with high-grade DCIS, broadly present at  anterior margin and inferior margin 2/5 LN positive, LVI Present,  Left mastectomy: ALH, ER 95%, PR 5-65%, HER-2 negative, Ki-67 10-20%, T2 N1 a stage IIB    11/05/2015 - 03/17/2016 Chemotherapy    Adjuvant chemotherapy with dose dense Adriamycin and Cytoxan 4 followed by Abraxane weekly 12    04/22/2016 - 06/08/2016 Radiation Therapy    Adjuvant radiation therapy    06/22/2016 Surgery    Hysterectomy with  bilateral salpingo-oophorectomy: Negative for malignancy    06/30/2016 -  Anti-estrogen oral therapy    Anastrozole 1 mg daily, patient is on a clinical trial [name] ( randomized to Palbociclib)     CHIEF COMPLIANT: Follow-up on [name] clinical trial on Ibrance  INTERVAL HISTORY: Melanie Barber is a 52 year old with above-mentioned history of right breast cancer currently on adjuvant anastrozole and appears to be tolerating that fairly well.  She is also on [place] on [place] clinical trial and she has no major side effects from it.  She does have some arthralgias which are related to anastrozole therapy.  Denies any pain lumps or nodules in the breast.  REVIEW OF SYSTEMS:   Constitutional: Denies fevers, chills or abnormal weight loss Eyes: Denies blurriness of vision Ears, nose, mouth, throat, and face: Denies mucositis or sore throat Respiratory: Denies cough, dyspnea or wheezes Cardiovascular: Denies palpitation, chest discomfort Gastrointestinal:  Denies nausea, heartburn or change in bowel habits Skin: Denies abnormal skin rashes Lymphatics: Denies new lymphadenopathy or easy bruising Neurological:Denies numbness, tingling or new weaknesses Behavioral/Psych: Mood is stable, no new changes  Extremities: No lower extremity edema Breast:  denies any pain or lumps or nodules in either breasts All other systems were reviewed with the patient and are negative.  I have reviewed the past medical history, past surgical history, social history and family history with the patient and they are unchanged from previous note.  ALLERGIES:  is allergic to keflex [cephalexin] and decadron [dexamethasone].  MEDICATIONS:  Current Outpatient Medications  Medication Sig Dispense Refill  . anastrozole (ARIMIDEX) 1 MG tablet Take 1 tablet (1 mg total) by mouth daily. 90 tablet 3  . Biotin 10 MG TABS Take 10 mg by mouth daily.    Marland Kitchen buPROPion (WELLBUTRIN XL) 300 MG 24 hr tablet Take 1 tablet (300 mg  total) by mouth daily.    . Calcium Carbonate-Vitamin D (CALCIUM 600+D PO) Take 1 tablet by mouth daily.    . Investigational palbociclib (IBRANCE) 100 MG capsule [employer] [facility] AFT-05 [name] Take 1 capsule (100 mg total) by mouth daily. Take with food. Swallow whole. Do not chew. Take on days 1-21. Repeat every 28 days. 63 capsule 0  . Melatonin 5 MG CAPS Take 5 mg by mouth at bedtime as needed (sleep).    . Multiple Vitamin (MULTIVITAMIN) tablet Take 1 tablet by mouth daily.    . naproxen sodium (ANAPROX) 220 MG tablet Take 220-440 mg by mouth daily as needed (pain).     . ondansetron (ZOFRAN) 8 MG tablet Take 0.5 tablets (4 mg total) every 8 (eight) hours as needed by mouth for nausea or vomiting. (Patient taking differently: Take 4-8 mg by mouth every 8 (eight) hours as needed for nausea or vomiting. ) 30 tablet 0   No current facility-administered medications for this visit.     PHYSICAL EXAMINATION: ECOG PERFORMANCE STATUS: 1 - Symptomatic but completely ambulatory  Vitals:   10/05/17 1505  BP: 109/73  Pulse: 83  Resp: 18  Temp: 97.7 F (36.5 C)  SpO2: 99%   Filed Weights   10/05/17 1505  Weight: 175 lb 3.2 oz (79.5 kg)    GENERAL:alert, no distress and comfortable SKIN: skin color, texture, turgor are normal, no rashes or significant lesions EYES: normal, Conjunctiva are pink and non-injected, sclera clear OROPHARYNX:no exudate, no erythema and lips, buccal mucosa, and tongue normal  NECK: supple, thyroid normal size, non-tender, without nodularity LYMPH:  no palpable lymphadenopathy in the cervical, axillary or inguinal LUNGS: clear to auscultation and percussion with normal breathing effort HEART: regular rate & rhythm and no murmurs and no lower extremity edema ABDOMEN:abdomen soft, non-tender and normal bowel sounds MUSCULOSKELETAL:no cyanosis of digits and no clubbing  NEURO: alert & oriented x 3 with fluent speech, no focal motor/sensory deficits EXTREMITIES:  No lower extremity edema   LABORATORY DATA:  I have reviewed the data as listed CMP Latest Ref Rng & Units 10/05/2017 07/13/2017 04/20/2017  Glucose 70 - 99 mg/dL 83 90 95  BUN 6 - 20 mg/dL 12 21(H) 21  Creatinine 0.44 - 1.00 mg/dL 0.88 0.90 0.75  Sodium 135 - 145 mmol/L 141 140 142  Potassium 3.5 - 5.1 mmol/L 3.8 3.8 3.8  Chloride 98 - 111 mmol/L 105 105 106  CO2 22 - 32 mmol/L 27 26 30(H)  Calcium 8.9 - 10.3 mg/dL 9.3 9.3 9.3  Total Protein 6.5 - 8.1 g/dL 7.2 7.2 6.9  Total Bilirubin 0.3 - 1.2 mg/dL 0.8 0.5 0.5  Alkaline Phos 38 - 126 U/L 72 68 78  AST 15 - 41 U/L _0 ALT 0 - 44 U/L 23 20 34    Lab Results  Component Value Date   WBC 1.9 (L) 10/05/2017   HGB 12.7 10/05/2017   HCT 36.5 10/05/2017   MCV 106.4 (H) 10/05/2017   PLT 133 (L) 10/05/2017   NEUTROABS 1.1 (L) 10/05/2017    ASSESSMENT & PLAN:  Breast cancer of upper-outer quadrant of right female  breast [facility]) Bilateral mastectomies 10/01/2015 With reconstruction Right mastectomy: IDC grade 2, multifocal largest 2.5 cm, with high-grade DCIS, broadly present at anterior margin and inferior margin 2/5 LN positive, LVI Present,  Left mastectomy: ALH, ER 95%, PR 5-65%, HER-2 negative, Ki-67 10-20%,  Pathologic stage: T2 N1 a stage IIB Mammaprint: High risk CT-CAP and bone scan: No evidence of metastatic disease ----------------------------------------------------------------------------------------------------------------------------------------------------------------- Treatment summary: 1. Adjuvant chemotherapy with dose dense Adriamycin and Cytoxan 4 followed by Abraxane weekly 12 started 11/05/2015 completed 03/17/2016 3. Followed by adjuvant radiation therapy Completed 06/08/2016 4. Followed by adjuvant antiestrogen therapy with anastrozole 7years  PREVENT trial: [id number]  No toxicities to Study  drug -------------------------------------------------------------------------------------------------------------------------------------------------------- Anastrozole Toxicities: No hot flashes or myalgias. Patient is tolerating anastrozole extremely well.  [name] clinical trial:Patient has been randomized to Palbociclib currently on 100 mg daily   Palbociclib toxicities: 1. Fatigue: Much improved with reduced dosage continue 2. Neutropenia: Today's ANC is patient can continue 100 mg dosage of palbociclib  Return to clinic in 3 months for follow-up    Orders Placed This Encounter  Procedures  . CBC with Differential (Cancer Center Only)    Standing Status:   Future    Standing Expiration Date:   10/06/2018  . CMP ([place] only)    Standing Status:   Future    Standing Expiration Date:   10/06/2018   The patient has a good understanding of the overall plan. she agrees with it. she will call with any problems that may develop before the next visit here.   Harriette Ohara, MD 10/05/17

## 2017-10-10 ENCOUNTER — Ambulatory Visit (HOSPITAL_BASED_OUTPATIENT_CLINIC_OR_DEPARTMENT_OTHER)
Admission: RE | Admit: 2017-10-10 | Discharge: 2017-10-10 | Disposition: A | Discharge status: Home / Self Care | Payer: 59 | Source: Ambulatory Visit | Attending: Internal Medicine | Admitting: Internal Medicine

## 2017-10-10 ENCOUNTER — Ambulatory Visit (HOSPITAL_COMMUNITY)
Admission: RE | Admit: 2017-10-10 | Discharge: 2017-10-10 | Disposition: A | Discharge status: Home / Self Care | Payer: 59 | Source: Ambulatory Visit | Attending: Family Medicine | Admitting: Family Medicine

## 2017-10-10 ENCOUNTER — Encounter (HOSPITAL_COMMUNITY): Payer: Self-pay | Admitting: Internal Medicine

## 2017-10-10 ENCOUNTER — Telehealth: Payer: Self-pay | Admitting: Hematology and Oncology

## 2017-10-10 VITALS — BP 119/82 | HR 83 | Wt 176.5 lb

## 2017-10-10 DIAGNOSIS — F329 Major depressive disorder, single episode, unspecified: Secondary | ICD-10-CM | POA: Insufficient documentation

## 2017-10-10 DIAGNOSIS — Z881 Allergy status to other antibiotic agents status: Secondary | ICD-10-CM | POA: Diagnosis not present

## 2017-10-10 DIAGNOSIS — C50411 Malignant neoplasm of upper-outer quadrant of right female breast: Secondary | ICD-10-CM

## 2017-10-10 DIAGNOSIS — Z170 Estrogen receptor positive status [ER+]: Secondary | ICD-10-CM

## 2017-10-10 DIAGNOSIS — Z975 Presence of (intrauterine) contraceptive device: Secondary | ICD-10-CM | POA: Insufficient documentation

## 2017-10-10 DIAGNOSIS — Z9221 Personal history of antineoplastic chemotherapy: Secondary | ICD-10-CM | POA: Insufficient documentation

## 2017-10-10 DIAGNOSIS — Z79899 Other long term (current) drug therapy: Secondary | ICD-10-CM | POA: Diagnosis not present

## 2017-10-10 MED FILL — ANASTROZOLE 1 MG TABLET: 1 | 90 days supply | Qty: 90 | Fill #0

## 2017-10-10 MED FILL — BuPROPion HCL ER (XL) 300 M: 300 | 90 days supply | Qty: 90 | Fill #1

## 2017-10-10 NOTE — Patient Instructions (Signed)
We will contact you in 6 months to schedule your next appointment and echocardiogram  

## 2017-10-10 NOTE — Addendum Note (Signed)
Encounter addended by: Scarlette Calico, RN on: 10/10/2017 4:03 PM  Actions taken: Order list changed, Diagnosis association updated, Sign clinical note

## 2017-10-10 NOTE — Telephone Encounter (Signed)
Tried to call regarding 10/4 sch msg left a message

## 2017-10-10 NOTE — Progress Notes (Signed)
  Echocardiogram 2D Echocardiogram has been performed.  Melanie Barber 10/10/2017, 2:29 PM

## 2017-10-10 NOTE — Progress Notes (Signed)
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 Lindi Adie to the cardio-oncology clinic.   SUMMARY OF ONCOLOGIC HISTORY:       Breast cancer of upper-outer quadrant of right female breast ([place])   09/02/2015 Mammogram    Right breast UOQ: 2 masses 3 cm apart, U/S they measured 3.4 cm, larger mass at 1.6 cm, in addition, 1 cm mass at 9:00 and 0.9 cm mass at 8:30( could be intramammary LN); right axillary LN 11 mm; microcalcs 10 cm span       09/09/2015 Initial Diagnosis    Right breast biopsy OUQ: DCIS with calcs and necrosis ER 95%, PR 80%; 9:30 position 5cmfn: IDC with DCIS ER 95%, PR 65%, Ki-67 20%,; right biopsy 8:30 position 3cmfn ER 95%, PR 5%, Ki-67 10%: intramammary LN with IDC      09/12/2015 Breast MRI    Multicentric right breast cancer cluster of enhancing masses 4.1 x 2.2 x 2.6 cm, LOQ: 1.1 cm enhancing mass previously biopsied, subcutaneous low right breast 2 cm mass not biopsied, multiple other small enhancing masses, 1 cm right axillary lymph node      10/01/2015 Surgery    Right mastectomy: IDC grade 2, multifocal largest 2.5 cm, with high-grade DCIS, broadly present at  anterior margin and inferior margin 2/5 LN positive, LVI Present,  Left mastectomy: ALH, ER 95%, PR 5-65%, HER-2 negative, Ki-67 10-20%, T2 N1 a stage IIB      11/05/2015 - 03/17/2016 Chemotherapy    Adjuvant chemotherapy with dose dense Adriamycin and Cytoxan 4 followed by Abraxane weekly 12      04/22/2016 - 06/08/2016 Radiation Therapy    Adjuvant radiation therapy      06/22/2016 Surgery    Hysterectomy with bilateral salpingo-oophorectomy: Negative for malignancy         Finished Adriamycin in January 2018. In October 201 started on Ibrance doing 3/4 weeks x 2 years [facility] trial) - but dose reduced due to low counts. Underwent breast  reconstruction with lat flap in 12/18. Doing well working FT in EP lab. Still having some fatigue but denies orthopnea, edema or PND.    ECHO 02/26/16: EF 60% lateral s' 12.7 GLS -21.7% Echo 08/11/16: EF 60%, lateral s' 13.2  GLS -19.3 %  Echo 04/14/17: EF 60-65%, lateral s' 14.8. GLS -21.7% Echo 10/10/17: EF 60-65% GLS -19.0% (slightly underestimated due to poor tracking)   Past Medical History:  Diagnosis Date  . Arthritis    right knee (12/20/2016)  . Cancer of right breast ([place])   . Depression   . PONV (postoperative nausea and vomiting)   . Sciatic leg pain    left leg    Current Outpatient Medications  Medication Sig Dispense Refill  . anastrozole (ARIMIDEX) 1 MG tablet Take 1 tablet (1 mg total) by mouth daily. 90 tablet 3  . Biotin 10 MG TABS Take 10 mg by mouth daily.    Marland Kitchen buPROPion (WELLBUTRIN XL) 300 MG 24 hr tablet Take 1 tablet (300 mg total) by mouth daily.    . Calcium Carbonate-Vitamin D (CALCIUM 600+D PO) Take 1 tablet by mouth daily.    . Investigational palbociclib (IBRANCE) 100 MG capsule [employer] [facility] AFT-05 [name] Take 1 capsule (100 mg total) by mouth daily. Take with food. Swallow whole. Do not chew. Take on days 1-21. Repeat every 28 days. 63 capsule 0  .  Melatonin 5 MG CAPS Take 5 mg by mouth at bedtime as needed (sleep).    . Multiple Vitamin (MULTIVITAMIN) tablet Take 1 tablet by mouth daily.    . naproxen sodium (ANAPROX) 220 MG tablet Take 220-440 mg by mouth daily as needed (pain).     . ondansetron (ZOFRAN) 8 MG tablet Take 0.5 tablets (4 mg total) every 8 (eight) hours as needed by mouth for nausea or vomiting. (Patient taking differently: Take 4-8 mg by mouth every 8 (eight) hours as needed for nausea or vomiting. ) 30 tablet 0   No current facility-administered medications for this encounter.     Allergies  Allergen Reactions  . Keflex [Cephalexin] Anaphylaxis  . Decadron [Dexamethasone] Anxiety    Beyond hyper      Social History    Socioeconomic History  . Marital status: Divorced    Spouse name: Not on file  . Number of children: Not on file  . Years of education: Not on file  . Highest education level: Not on file  Occupational History  . Not on file  Social Needs  . Financial resource strain: Not on file  . Food insecurity:    Worry: Not on file    Inability: Not on file  . Transportation needs:    Medical: Not on file    Non-medical: Not on file  Tobacco Use  . Smoking status: Never Smoker  . Smokeless tobacco: Never Used  Substance and Sexual Activity  . Alcohol use: Yes    Alcohol/week: 2.0 standard drinks    Types: 2 Glasses of wine per week  . Drug use: No  . Sexual activity: Yes    Birth control/protection: IUD  Lifestyle  . Physical activity:    Days per week: Not on file    Minutes per session: Not on file  . Stress: Not on file  Relationships  . Social connections:    Talks on phone: Not on file    Gets together: Not on file    Attends religious service: Not on file    Active member of club or organization: Not on file    Attends meetings of clubs or organizations: Not on file    Relationship status: Not on file  . Intimate partner violence:    Fear of current or ex partner: Not on file    Emotionally abused: Not on file    Physically abused: Not on file    Forced sexual activity: Not on file  Other Topics Concern  . Not on file  Social History Narrative  . Not on file      Family History  Problem Relation Age of Onset  . Depression Mother   . Inflammatory bowel disease Mother   . Alcohol abuse Mother   . Bipolar disorder Mother   . Hypertension Son   . Other Brother        severe intellectual disabilities  . Arthritis Maternal Uncle        hx knee replacement surgeries  . Stroke Maternal Grandmother 86  . Breast cancer Maternal Grandmother        dx before menopause  . Uterine cancer Maternal Grandmother        dx late 14s  . Bipolar disorder Maternal  Grandfather   . Stroke Paternal Grandmother 28  . Diabetes Paternal Grandmother        later in life  . Heart attack Paternal Grandfather     Vitals:   10/10/17 1455  BP: 119/82  Pulse: 83  SpO2: 100%  Weight: 80.1 kg (176 lb 8 oz)    PHYSICAL EXAM: General:  Well appearing. No resp difficulty HEENT: normal Neck: supple. no JVD. Carotids 2+ bilat; no bruits. No lymphadenopathy or thryomegaly appreciated. Cor: PMI nondisplaced. Regular rate & rhythm. No rubs, gallops or murmurs. Lungs: clear Abdomen: soft, nontender, nondistended. No hepatosplenomegaly. No bruits or masses. Good bowel sounds. Extremities: no cyanosis, clubbing, rash, edema Neuro: alert & orientedx3, cranial nerves grossly intact. moves all 4 extremities w/o difficulty. Affect pleasant   ASSESSMENT & PLAN: 1. R breast CA (ER/PR+) - has completed neoadjuvant chemo including adriamycin in 1/18. Continues anastrozole.   - s/p hysterectomy in June 2018. Breast reconstruction 12/18 - Now getting Ibrance x 2 years (starting 10/18)  Minimal risk of cardiotoxicity with Ibrance, she is going to take 3 weeks on, 1 week off. She is at high risk for breast CA recurrence. - Echo images reviewed personally. All parameters stable. Reviewed signs and symptoms of HF to look for. Continue Ibrance. Repeat echo in 6 months.   Glori Bickers, MD  3:44 PM

## 2017-11-07 ENCOUNTER — Encounter: Payer: Self-pay | Admitting: *Deleted

## 2017-11-07 NOTE — Progress Notes (Signed)
11/07/17 at 4:43pm - [place] month 25 phone contact- The research nurse called the pt for her month 25 phone contact.  The pt confirmed that her last dose of study drug, atorvastatin/placebo, was on 10/04/17.  The pt denied experiencing any aching or pain in her legs since discontinuing her study drug.  The pt denied any other problems taking the study drug. The pt denied any ER visits or hospital admissions since stopping her study drug.  The pt denied any questions/concerns about her study participation.  The pt was thanked for her support and participation in this important clinical trial.  The pt was informed that her month 24 cardiac MRI has not been read by Dr. Danny Lawless yet.  The site will await the pt's MRI clinical report from [facility].  Brion Aliment RN, BSN, CCRP Clinical Research Nurse 11/07/2017 4:50 PM

## 2017-12-23 DIAGNOSIS — Z9013 Acquired absence of bilateral breasts and nipples: Secondary | ICD-10-CM | POA: Diagnosis not present

## 2017-12-26 ENCOUNTER — Other Ambulatory Visit: Payer: 59

## 2017-12-26 ENCOUNTER — Ambulatory Visit: Payer: 59 | Admitting: Oncology

## 2017-12-26 NOTE — Progress Notes (Signed)
[facility]  [phone] Fax:([phone]    ID: Melanie Barber DOB: Mar 20, 1965  MR#: [id number]  FHL#:[id number]  Patient Care Team: Fanny Bien, MD as PCP - General (Family Medicine) Rolm Bookbinder, MD as Consulting Physician (General Surgery) Nicholas Lose, MD as Consulting Physician (Hematology and Oncology) Delice Bison, Charlestine Massed, NP as Nurse Practitioner (Hematology and Oncology) Kyung Rudd, MD as Consulting Physician (Radiation Oncology) Bensimhon, Shaune Pascal, MD as Consulting Physician (Cardiology) OTHER MD:   CHIEF COMPLAINT: estrogen receptor positive breast cancer   CURRENT TREATMENT: [name] clinical trial: randomized to palbociclib; PREVENT trial, receiving atorvastatin/placebo   HISTORY OF CURRENT ILLNESS: Melanie Barber had routine screening mammography in [place], [place] showing a possible abnormality in the right breast. She underwent unilateral right diagnostic mammography with tomography and right breast ultrasonography at [facility] on 09/01/2017 showing: Breast Density Category B. On mammography, there are two oval circumscribed masses in the outer right breast separated by 3 cm on the 90 degree lateral view. Best seen in the CC projection, just deep to the circumscribed oval mass furthest from the nipple, is an irregular spiculated mass with associated heterogeneous microcalcifications. Magnification views were performed of the outer right breast for evaluation of the extent of the microcalcifications associated with the mass. Microcalcifications extend approximately 10 cm anterior to posterior dimension, 7 cm superior to inferior, and 5 cm transverse dimension. On physical exam, there was no palpable mass in the outer right breast.   Targeted ultrasound is performed, showing two adjacent irregular masses with microlobulated margins that in total measure 3.4 cm at 9:30 position 5 cm from the nipple. The larger of the  individual masses measures 1.6 x 1.2 x 1.0 cm. These masses result in marked posterior acoustic shadowing. At 9 o'clock position 5 cm from the nipple, adjacent to one of these hypoechoic masses is a partially anechoic and partially hypoechoic oval mass measuring 1.0 x 0.5 x 0.7 cm. This corresponds to a 1.0 cm oval mass seen in the outer right breast, adjacent to the suspicious mass on the mammogram. This mass may be an intramammary lymph node involved with tumor, or a complex cyst. At 8:30 position 3 cm from the nipple is a heterogeneous hypoechoic oval and lobulated mass measuring 0.9 x 0.6 x 0.5 cm, and this is adjacent to a prominent blood vessel and contains central vascular flow. This is suspicious for an intramammary lymph node involved with malignancy. Ultrasound of the right axilla is performed. Subjacent to the pectoralis muscle is a probable 9 x 4 x 11 mm lymph node with a hypoechoic cortex measuring approximately 4 mm. This lymph node is equivocal. Level 1 right axillary lymph nodes appear normal on ultrasound.  Accordingly on 09/09/2015 she proceeded to biopsy of the right breast upper outer anterior area in question. The pathology from this procedure showed ([id number]): ductal carcinoma in situ with calcifications. Prognostic indicators significant for: estrogen receptor, 95% positive and progesterone receptor, 80% positive, both with strong staining intensity.   On the same day, a second biopsy of the right breast at 9:30, 5cm from the nipple was performed. The pathology from this procedure showed ([id number]): invasive mammary carcinoma and ductal carcinoma in situ; e-cadherin positive, which is ductal consistent. Grade not stated. Prognostic indicators significant for: estrogen receptor, 95% positive and progesterone receptor, 65% positive, both with strong staining intensity. Proliferation marker Ki67 at 20%. HER2  negative by fluorescent in situ hybridization with a signals ratio 1.00  and number  per cell 1.70.   Finally, with the previous two biopsies, she also proceeded to biopsy the right breast at 8:30, 3 cm from the nipple area in question. The pathology from this procedure showed ([id number]): intramammary lymph node with metastatic carcinoma. Prognostic indicators significant for: estrogen receptor, 95% positive and progesterone receptor, 80% positive, both with strong staining intensity. Proliferation marker Ki67 at 20%. HER2 negative by fluorescent in situ hybridization with a signals ratio 1.06 and number per cell 1.90.   The patient's subsequent history is as detailed below.   INTERVAL HISTORY: Melanie Barber returns today for follow-up of her estrogen receptor positive breast cancer.   She continues on anastrozole, which she is tolerating well. She does experience hot flashes. She has some vaginal dryness.   The patient also continues on Ibrance/palbociclib, currently at the 100 mg dose. She is tolerating her treatment well. She does not feel much difference, "maybe slightly more energy," on her weeks off.    Since her last visit here on 10/05/2017 with Dr. Lindi Adie, she has not undergone any studies.   REVIEW OF SYSTEMS: Melanie Barber is doing well overall. For work, she is on her feet, but she does not check her step counts. She does consider herself to be a walker, however. She is feeling lightly fatigued, She has some nausea, but this usually occurs when she is tired and has not eaten recently. The patient denies unusual headaches, visual changes, vomiting, or dizziness. There has been no unusual cough, phlegm production, or pleurisy. This been no change in bowel or bladder habits. The patient denies unexplained weight loss, bleeding, rash, or fever. A detailed review of systems was otherwise noncontributory.    PAST MEDICAL HISTORY: Past Medical History:  Diagnosis Date  . Arthritis    right knee (12/20/2016)  . Cancer of right breast ([place])   . Depression   . PONV  (postoperative nausea and vomiting)   . Sciatic leg pain    left leg     PAST SURGICAL HISTORY: Past Surgical History:  Procedure Laterality Date  . ABDOMINAL HYSTERECTOMY    . BREAST BIOPSY Right   . BREAST CYST ASPIRATION Right   . BREAST RECONSTRUCTION WITH PLACEMENT OF TISSUE EXPANDER AND FLEX HD (ACELLULAR HYDRATED DERMIS) Bilateral 10/01/2015  . BREAST RECONSTRUCTION WITH PLACEMENT OF TISSUE EXPANDER AND FLEX HD (ACELLULAR HYDRATED DERMIS) Bilateral 10/01/2015   Procedure: BREAST RECONSTRUCTION WITH PLACEMENT OF TISSUE EXPANDER AND CORTIVA;  Surgeon: Irene Limbo, MD;  Location: [facility];  Service: Plastics;  Laterality: Bilateral;  . [place]   growth removed  . LATISSIMUS FLAP TO BREAST Right 12/20/2016  . LATISSIMUS FLAP TO BREAST Right 12/20/2016   Procedure: RIGHT LATISSIMUS FLAP TO BREAST;  Surgeon: Irene Limbo, MD;  Location: [facility];  Service: Plastics;  Laterality: Right;  . LIPOSUCTION WITH LIPOFILLING N/A 12/20/2016   Procedure: LIPOSUCTION WITH LIPOFILLING;  Surgeon: Irene Limbo, MD;  Location: [facility];  Service: Plastics;  Laterality: N/A;  . MASTECTOMY Left 10/01/2015   NIPPLE SPARING  . MASTECTOMY COMPLETE / SIMPLE W/ SENTINEL NODE BIOPSY Right 10/01/2015   NIPPLE SPARING  . NASAL/SINUS ENDOSCOPY Bilateral 2005  . NIPPLE SPARING MASTECTOMY/SENTINAL LYMPH NODE BIOPSY/RECONSTRUCTION/PLACEMENT OF TISSUE EXPANDER Bilateral 10/01/2015   Procedure: BILATERAL NIPPLE SPARING MASTECTOMY WITH RIGHT SENTINAL LYMPH NODE BIOPSY;  Surgeon: Rolm Bookbinder, MD;  Location: [facility];  Service: General;  Laterality: Bilateral;  . PORT-A-CATH REMOVAL Right 04/01/2016   Procedure: MINOR REMOVAL PORT-A-CATH;  Surgeon: Rolm Bookbinder, MD;  Location: [facility];  Service: General;  Laterality: Right;  . PORTACATH PLACEMENT Right 10/01/2015   Procedure: INSERTION PORT-A-CATH;  Surgeon: Rolm Bookbinder, MD;  Location: [facility];  Service: General;   Laterality: Right;  . REMOVAL OF BILATERAL TISSUE EXPANDERS WITH PLACEMENT OF BILATERAL BREAST IMPLANTS Bilateral 75/64/3329   SILICONE BREAST IMPLANTS, LIPOFILLING FROM ABDOMEN TO BILATERAL CHESTBilateral    . REMOVAL OF BILATERAL TISSUE EXPANDERS WITH PLACEMENT OF BILATERAL BREAST IMPLANTS Bilateral 12/20/2016   Procedure: REMOVAL OF BILATERAL TISSUE EXPANDERS WITH PLACEMENT OF BILATERAL SILICONE BREAST IMPLANTS, LIPOFILLING FROM ABDOMEN TO BILATERAL CHEST;  Surgeon: Irene Limbo, MD;  Location: [facility];  Service: Plastics;  Laterality: Bilateral;  . ROBOTIC ASSISTED TOTAL HYSTERECTOMY WITH BILATERAL SALPINGO OOPHERECTOMY Bilateral 06/22/2016   Procedure: XI ROBOTIC ASSISTED TOTAL HYSTERECTOMY WITH BILATERAL SALPINGO OOPHORECTOMY;  Surgeon: Everitt Amber, MD;  Location: [facility];  Service: Gynecology;  Laterality: Bilateral;  . TOE SURGERY Left 1990   "bone spur on great toe"  . TONSILLECTOMY  1977  . ULTRASOUND GUIDANCE FOR VASCULAR ACCESS Right 10/01/2015   Procedure: ULTRASOUND GUIDANCE;  Surgeon: Rolm Bookbinder, MD;  Location: [facility];  Service: General;  Laterality: Right;  . WISDOM TOOTH EXTRACTION       FAMILY HISTORY: Family History  Problem Relation Age of Onset  . Depression Mother   . Inflammatory bowel disease Mother   . Alcohol abuse Mother   . Bipolar disorder Mother   . Hypertension Son   . Other Brother        severe intellectual disabilities  . Arthritis Maternal Uncle        hx knee replacement surgeries  . Stroke Maternal Grandmother 86  . Breast cancer Maternal Grandmother        dx before menopause  . Uterine cancer Maternal Grandmother        dx late 66s  . Bipolar disorder Maternal Grandfather   . Stroke Paternal Grandmother 20  . Diabetes Paternal Grandmother        later in life  . Heart attack Paternal Grandfather     GYNECOLOGIC HISTORY:  No LMP recorded. Patient has had a hysterectomy. 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    ADVANCED DIRECTIVES:    HEALTH MAINTENANCE: Social History   Tobacco Use  . Smoking status: Never Smoker  . Smokeless tobacco: Never Used  Substance Use Topics  . Alcohol use: Yes    Alcohol/week: 2.0 standard drinks    Types: 2 Glasses of wine per week  . Drug use: No    Colonoscopy:   PAP:   Bone density:    Allergies  Allergen Reactions  . Keflex [Cephalexin] Anaphylaxis  . Decadron [Dexamethasone] Anxiety    Beyond hyper    Current Outpatient Medications  Medication Sig Dispense Refill  . anastrozole (ARIMIDEX) 1 MG tablet Take 1 tablet (1 mg total) by mouth daily. 90 tablet 3  . Biotin 10 MG TABS Take 10 mg by mouth daily.    Marland Kitchen buPROPion (WELLBUTRIN XL) 300 MG 24 hr tablet Take 1 tablet (300 mg total) by mouth daily.    . Calcium Carbonate-Vitamin D (CALCIUM 600+D PO) Take 1 tablet by mouth daily.    . Investigational palbociclib (IBRANCE) 100 MG capsule [facility] AFT-05 [name] Take 1 capsule (  100 mg total) by mouth daily. Take with food. Swallow whole. Do not chew. Take on days 1-21. Repeat every 28 days. 63 capsule 0  . Melatonin 5 MG CAPS Take 5 mg by mouth at bedtime as needed (sleep).    . Multiple Vitamin (MULTIVITAMIN) tablet Take 1 tablet by mouth daily.    . ondansetron (ZOFRAN) 8 MG tablet Take 0.5 tablets (4 mg total) every 8 (eight) hours as needed by mouth for nausea or vomiting. (Patient taking differently: Take 4-8 mg by mouth every 8 (eight) hours as needed for nausea or vomiting. ) 30 tablet 0   No current facility-administered medications for this visit.      OBJECTIVE: Middle-aged white woman who appears well  Vitals:   12/27/17 1050  BP: 114/80  Pulse: 88  Resp: 18  Temp: 97.9 F (36.6 C)  SpO2: 99%     Body mass index is 27.69 kg/m.   Wt Readings from  Last 3 Encounters:  12/27/17 176 lb 12.8 oz (80.2 kg)  10/10/17 176 lb 8 oz (80.1 kg)  10/05/17 175 lb 3.2 oz (79.5 kg)      ECOG FS:0 - Asymptomatic  Ocular: Sclerae unicteric, pupils round and equal Lymphatic: No cervical or supraclavicular adenopathy Lungs no rales or rhonchi Heart regular rate and rhythm Abd soft, nontender, positive bowel sounds MSK no focal spinal tenderness, no joint edema Neuro: non-focal, well-oriented, appropriate affect Breasts: Status post bilateral mastectomies with bilateral silicone implant reconstruction and right-sided latissimus flap and radiation, with no evidence of disease recurrence.  Both axillae are benign.   LAB RESULTS:  CMP     Component Value Date/Time   NA 142 12/27/2017 1009   NA 140 10/22/2016 0951   K 4.0 12/27/2017 1009   K 4.1 10/22/2016 0951   CL 105 12/27/2017 1009   CO2 28 12/27/2017 1009   CO2 26 10/22/2016 0951   GLUCOSE 76 12/27/2017 1009   GLUCOSE 76 10/22/2016 0951   BUN 14 12/27/2017 1009   BUN 21.4 10/22/2016 0951   CREATININE 0.78 12/27/2017 1009   CREATININE 0.7 10/22/2016 0951   CALCIUM 9.0 12/27/2017 1009   CALCIUM 9.1 10/22/2016 0951   PROT 6.9 12/27/2017 1009   PROT 6.7 10/22/2016 0951   ALBUMIN 4.0 12/27/2017 1009   ALBUMIN 4.0 10/22/2016 0951   AST 18 12/27/2017 1009   AST 21 10/22/2016 0951   ALT 15 12/27/2017 1009   ALT 20 10/22/2016 0951   ALKPHOS 65 12/27/2017 1009   ALKPHOS 68 10/22/2016 0951   BILITOT 0.5 12/27/2017 1009   BILITOT 0.64 10/22/2016 0951   GFRNONAA >60 12/27/2017 1009   GFRAA >60 12/27/2017 1009    No results found for: TOTALPROTELP, ALBUMINELP, A1GS, A2GS, BETS, BETA2SER, GAMS, MSPIKE, SPEI  No results found for: KPAFRELGTCHN, LAMBDASER, KAPLAMBRATIO  Lab Results  Component Value Date   WBC 2.3 (L) 12/27/2017   NEUTROABS 1.3 (L) 12/27/2017   HGB 12.5 12/27/2017   HCT 35.3 (L) 12/27/2017   MCV 103.5 (H) 12/27/2017   PLT 134 (L) 12/27/2017     _0 @  No results found for: LABCA2  No components found for: [id number]  No results for input(s): INR in the last 168 hours.  No results found for: LABCA2  No results found for: PYK998  No results found for: PJA250  No results found for: NLZ767  No results found for: CA2729  No components found for: HGQUANT  No results found for: CEA1 / No results found for: CEA1  No results found for: AFPTUMOR  No results found for: [place]  No results found for: PSA1  Appointment on 12/27/2017  Component Date Value Ref Range Status  . Sodium 12/27/2017 142  135 - 145 mmol/L Final  . Potassium 12/27/2017 4.0  3.5 - 5.1 mmol/L Final  . Chloride 12/27/2017 105  98 - 111 mmol/L Final  . CO2 12/27/2017 28  22 - 32 mmol/L Final  . Glucose, Bld 12/27/2017 76  70 - 99 mg/dL Final  . BUN 12/27/2017 14  6 - 20 mg/dL Final  . Creatinine 12/27/2017 0.78  0.44 - 1.00 mg/dL Final  . Calcium 12/27/2017 9.0  8.9 - 10.3 mg/dL Final  . Total Protein 12/27/2017 6.9  6.5 - 8.1 g/dL Final  . Albumin 12/27/2017 4.0  3.5 - 5.0 g/dL Final  . AST 12/27/2017 18  15 - 41 U/L Final  . ALT 12/27/2017 15  0 - 44 U/L Final  . Alkaline Phosphatase 12/27/2017 65  38 - 126 U/L Final  . Total Bilirubin 12/27/2017 0.5  0.3 - 1.2 mg/dL Final  . GFR, Est Non Af Am 12/27/2017 >60  >60 mL/min Final  . GFR, Est AFR Am 12/27/2017 >60  >60 mL/min Final  . Anion gap 12/27/2017 9  5 - 15 Final   Performed at [facility], [place] [address] [place], [place] [address]  . WBC Count 12/27/2017 2.3* 4.0 - 10.5 K/uL Final  . RBC 12/27/2017 3.41* 3.87 - 5.11 MIL/uL Final  . Hemoglobin 12/27/2017 12.5  12.0 - 15.0 g/dL Final  . HCT 12/27/2017 35.3* 36.0 - 46.0 % Final  . MCV 12/27/2017 103.5* 80.0 - 100.0 fL Final  . MCH 12/27/2017 36.7* 26.0 - 34.0 pg Final  . MCHC 12/27/2017 35.4  30.0 - 36.0 g/dL Final  . RDW 12/27/2017 12.1  11.5 - 15.5 % Final  . Platelet Count 12/27/2017 134* 150  - 400 K/uL Final  . nRBC 12/27/2017 0.0  0.0 - 0.2 % Final  . Neutrophils Relative % 12/27/2017 56  % Final  . Neutro Abs 12/27/2017 1.3* 1.7 - 7.7 K/uL Final  . Lymphocytes Relative 12/27/2017 26  % Final  . Lymphs Abs 12/27/2017 0.6* 0.7 - 4.0 K/uL Final  . Monocytes Relative 12/27/2017 15  % Final  . Monocytes Absolute 12/27/2017 0.3  0.1 - 1.0 K/uL Final  . Eosinophils Relative 12/27/2017 1  % Final  . Eosinophils Absolute 12/27/2017 0.0  0.0 - 0.5 K/uL Final  . Basophils Relative 12/27/2017 2  % Final  . Basophils Absolute 12/27/2017 0.1  0.0 - 0.1 K/uL Final  . Immature Granulocytes 12/27/2017 0  % Final  . Abs Immature Granulocytes 12/27/2017 0.00  0.00 - 0.07 K/uL Final   Performed at [facility], [place] [address] [place], [place] [address]    (this displays the last labs from the last 3 days)  No results found for: TOTALPROTELP, ALBUMINELP, A1GS, A2GS, BETS, BETA2SER, GAMS, MSPIKE, SPEI (this displays SPEP labs)  No results found for: KPAFRELGTCHN, LAMBDASER, KAPLAMBRATIO (kappa/lambda light chains)  No results found for: HGBA, HGBA2QUANT, HGBFQUANT, HGBSQUAN (Hemoglobinopathy evaluation)   No results found for: LDH  No results found for: IRON, TIBC, IRONPCTSAT (Iron and TIBC)  No results found for: FERRITIN  Urinalysis    Component Value Date/Time   COLORURINE YELLOW 04/19/2017 1159   APPEARANCEUR HAZY (A) 04/19/2017 1159   LABSPEC 1.015 04/19/2017 1159   PHURINE 6.0 04/19/2017 1159   GLUCOSEU  NEGATIVE 04/19/2017 1159   HGBUR MODERATE (A) 04/19/2017 1159   BILIRUBINUR NEGATIVE 04/19/2017 1159   KETONESUR NEGATIVE 04/19/2017 1159   PROTEINUR 30 (A) 04/19/2017 1159   UROBILINOGEN 1.0 03/21/2017 1844   NITRITE NEGATIVE 04/19/2017 1159   LEUKOCYTESUR LARGE (A) 04/19/2017 1159     STUDIES:  No results found.   ASSESSMENT: 52 y.o. [place], [place] woman status post right breast upper outer quadrant biopsies 09/09/2015 for clinically  mT1c N0 invasive ductal carcinoma, estrogen and progesterone receptor positive, HER-2 not amplified, with an MIB-1 of 20%  (1) on 10/01/2015 she underwent bilateral mastectomies, showing (a) in the left breast, no evidence of malignancy; no lymph nodes sampled (b) in the right breast, mpT2 pN1 stage IIB invasive ductal carcinoma, grade 2, with positive anterior and inferior margins; a total of 5 sentinel lymph nodes were removed, 2+  (c) margins discussed in Dr Cristal Generous note 10/10/2015 (clear) (d) status post right latissimus flap and bilateral silicone implant reconstruction 12/20/2016  (2) MammaPrint read as high risk, predicting a risk of recurrence of 29% without systemic adjuvant treatment, but a 5-year disease-free survival of 94.6% if treated with chemotherapy and antiestrogens.  (3) Adjuvant chemotherapy with dose dense doxorubicin and cyclophosphamide 4 followed by Abraxane weekly 12 started 11/05/2015, completed 03/17/2016  (4) adjuvant radiation 04/22/2016 to 06/08/2016 Site/dose:    1. 4 field Right breast was treated to 50.4 Gy in 28 fractions at 1.8 Gy per fraction. 2. The Right breast was boosted to 10 Gy in 5 fractions at 2 Gy per fraction.  (5) anastrozole 7years started   (6) PREVENT trial: [facility] [phone]  (a) Patient has been randomized to palbociclibcurrently on 100 mg daily  (7) Status post laparoscopic total hysterectomy and bilateral salpingo-oophorectomy 06/22/2016  PLAN: Melanie Barber is now a little over 2 years out from definitive surgery for her breast cancer with no evidence of disease recurrence.  This is very favorable.  She is tolerating anastrozole well, with mild, expected side effects including hot flashes and vaginal dryness.  Today we did discuss the pelvic health program through physical therapy and she will consider whether she wishes to participate.  She is tolerating the palbociclib with very mild nausea and minimal fatigue.  She has a strenuous  job and in addition to that exercises regularly.  No changes in her dose needed as her counts are fine.  Overall she is doing very well and she will return to see Dr. Payton Mccallum in 2 months as per protocol  She knows to call for any other issues that may develop before the next visit.  Saroya Riccobono, Virgie Dad, MD  12/27/17 11:15 AM Medical Oncology and Hematology [facility] [address] [place], [place] [address] Tel. [phone]    Fax. [phone]    I, Jacqualyn Posey am acting as a Education administrator for Chauncey Cruel, MD.   I, Lurline Del MD, have reviewed the above documentation for accuracy and completeness, and I agree with the above.

## 2017-12-27 ENCOUNTER — Inpatient Hospital Stay (HOSPITAL_BASED_OUTPATIENT_CLINIC_OR_DEPARTMENT_OTHER): Payer: 59 | Admitting: Oncology

## 2017-12-27 ENCOUNTER — Encounter: Payer: Self-pay | Admitting: *Deleted

## 2017-12-27 ENCOUNTER — Inpatient Hospital Stay: Payer: 59 | Attending: Hematology and Oncology

## 2017-12-27 VITALS — BP 114/80 | HR 88 | Temp 97.9°F | Resp 18 | Ht 67.0 in | Wt 176.8 lb

## 2017-12-27 DIAGNOSIS — Z90722 Acquired absence of ovaries, bilateral: Secondary | ICD-10-CM | POA: Insufficient documentation

## 2017-12-27 DIAGNOSIS — Z170 Estrogen receptor positive status [ER+]: Secondary | ICD-10-CM

## 2017-12-27 DIAGNOSIS — Z79811 Long term (current) use of aromatase inhibitors: Secondary | ICD-10-CM | POA: Insufficient documentation

## 2017-12-27 DIAGNOSIS — C50411 Malignant neoplasm of upper-outer quadrant of right female breast: Secondary | ICD-10-CM

## 2017-12-27 DIAGNOSIS — Z9221 Personal history of antineoplastic chemotherapy: Secondary | ICD-10-CM | POA: Insufficient documentation

## 2017-12-27 DIAGNOSIS — Z79899 Other long term (current) drug therapy: Secondary | ICD-10-CM | POA: Diagnosis not present

## 2017-12-27 DIAGNOSIS — Z9013 Acquired absence of bilateral breasts and nipples: Secondary | ICD-10-CM | POA: Insufficient documentation

## 2017-12-27 DIAGNOSIS — Z923 Personal history of irradiation: Secondary | ICD-10-CM | POA: Diagnosis not present

## 2017-12-27 DIAGNOSIS — R232 Flushing: Secondary | ICD-10-CM | POA: Diagnosis not present

## 2017-12-27 DIAGNOSIS — Z9882 Breast implant status: Secondary | ICD-10-CM | POA: Diagnosis not present

## 2017-12-27 DIAGNOSIS — Z006 Encounter for examination for normal comparison and control in clinical research program: Secondary | ICD-10-CM | POA: Diagnosis not present

## 2017-12-27 DIAGNOSIS — Z90710 Acquired absence of both cervix and uterus: Secondary | ICD-10-CM

## 2017-12-27 LAB — CMP (CANCER CENTER ONLY)
ALT: 15 U/L (ref 0–44)
AST: 18 U/L (ref 15–41)
Albumin: 4 g/dL (ref 3.5–5.0)
Alkaline Phosphatase: 65 U/L (ref 38–126)
Anion gap: 9 (ref 5–15)
BUN: 14 mg/dL (ref 6–20)
CO2: 28 mmol/L (ref 22–32)
Calcium: 9 mg/dL (ref 8.9–10.3)
Chloride: 105 mmol/L (ref 98–111)
Creatinine: 0.78 mg/dL (ref 0.44–1.00)
Glucose, Bld: 76 mg/dL (ref 70–99)
POTASSIUM: 4 mmol/L (ref 3.5–5.1)
Sodium: 142 mmol/L (ref 135–145)
Total Bilirubin: 0.5 mg/dL (ref 0.3–1.2)
Total Protein: 6.9 g/dL (ref 6.5–8.1)

## 2017-12-27 LAB — CBC WITH DIFFERENTIAL (CANCER CENTER ONLY)
ABS IMMATURE GRANULOCYTES: 0 10*3/uL (ref 0.00–0.07)
BASOS ABS: 0.1 10*3/uL (ref 0.0–0.1)
Basophils Relative: 2 %
EOS PCT: 1 %
Eosinophils Absolute: 0 10*3/uL (ref 0.0–0.5)
HCT: 35.3 % — ABNORMAL LOW (ref 36.0–46.0)
Hemoglobin: 12.5 g/dL (ref 12.0–15.0)
Immature Granulocytes: 0 %
Lymphocytes Relative: 26 %
Lymphs Abs: 0.6 10*3/uL — ABNORMAL LOW (ref 0.7–4.0)
MCH: 36.7 pg — ABNORMAL HIGH (ref 26.0–34.0)
MCHC: 35.4 g/dL (ref 30.0–36.0)
MCV: 103.5 fL — ABNORMAL HIGH (ref 80.0–100.0)
Monocytes Absolute: 0.3 10*3/uL (ref 0.1–1.0)
Monocytes Relative: 15 %
NEUTROS ABS: 1.3 10*3/uL — AB (ref 1.7–7.7)
NEUTROS PCT: 56 %
NRBC: 0 % (ref 0.0–0.2)
Platelet Count: 134 10*3/uL — ABNORMAL LOW (ref 150–400)
RBC: 3.41 MIL/uL — AB (ref 3.87–5.11)
RDW: 12.1 % (ref 11.5–15.5)
WBC: 2.3 10*3/uL — AB (ref 4.0–10.5)

## 2017-12-27 MED ORDER — INV-PALBOCICLIB 100 MG CAPS #23 ALLIANCE FOUNDATION AFT-05 (PALLAS): 100.0000 mg | ORAL_CAPSULE | Freq: Every day | ORAL | 0 refills | Status: DC

## 2017-12-27 NOTE — Progress Notes (Signed)
12/27/17 at 12:09pm - [name] cycle 18 study notes and re-consent to version 7, dated 08/02/17.   The pt was into the cancer center this morning for her cycle18assessments.  Research nurse informed the pt that there was a new consent form version 7, 08/02/17.  The pt read the consent form, and the research nurse reviewed the new changes with the pt. The pt said that she was comfortable continuing her participation in the study.  The pt signed the consent form, and the pt was given a copy of her signed forms for her records.  The pt signed the consent form at 10:50am.  The pt did returnher cycle 15-17study drug bottles.  The bottles were empty. The research nurse took theempty bottles to the pharmacy for the drug accountability check with Kennith Center, pharmacist. The pt returned hercycle 15, cycle 16, and cycle 13medication diaries.The pt confirmed that she has been 100% compliant with her study drug.The pt's labs were drawn, and the pt's ANC value is1.3 (grade 2)and WBC's is2.3 (grade 2)The pt's grade 1 low platelets are not clinically significant. The pt was informed that her cycle18 can start tomorrow without any dose modifications. The pt reports the following AE's as ongoing: fatigue (grade 1), arthralgia (grade 1), hot flashes (grade 2),andnausea (grade 1).The pt was seen and examined byDr. Magrinat.The pt reports mild vaginal dryness which Dr. Jana Hakim feels is related to her anastrozole. The ptupdated her current medication list.The pt states that she still takes naproxen as needed.  The pt was given her cycle18-20palbociclib andanti-hormone diaries to record her daily doses. The pt was dispensed 3 bottles of study drug (100mg  dose) assigned by the IRT system ([id number], [id number], and [id number]). The pt was thanked for her continued support of this trial. The pt was seen 1 day earlier due to her cycle 18 falling on Christmas Day.  The protocol allowed for the pt to be seen and  evaluated 1 day early.  The pt was instructed to begin her cycle 18 as scheduled on 12/28/17.  The pt verbalized understanding.  Brion Aliment RN, BSN, [place]  Clinical Research Nurse 12/27/2017 12:33 PM   Study/Protocol: [name]/AFT-05 Cycle:15-17 AE'sfrom10/03/19- 12/27/17                       Event Grade Onset  Date Resolved Date Attribution to Palbociclib Attribution to anastrozole Treatment Comments  Fatigue 1 09/16/16 Ongoing Yes  Yes Palbociclib/ anastrozole   Platelet countdecreased 1 07/13/17 Ongoing Yes No Palbociclib/ anastrozole Not clinically significant  Hot flashes 2 10/22/16 Ongoing  No  Yes Palbociclib/ anastrozole   Arthralgia  1 10/08/16 Ongoing No  Yes Palbociclib/ anastrozole   Nausea  1 11/01/16 Ongoing Yes  No Palbociclib/ anastrozole     White blood cell decreased 2 12/27/17 ongoing Yes No Palbociclib/ anastrozole   Vaginal dryness 1 12/27/17 ongoing No  Yes Palbociclib/ anastrozole   White blood cell decreased  3 10/05/17 12/27/17  Yes  No Palbociclib/ anastrozole   Neutrophil Count decreased 2 04/20/17 Ongoing  Yes  No Palbociclib/ anastrozole

## 2017-12-28 LAB — HEMOGLOBIN A1C
Hgb A1c MFr Bld: 4.7 % — ABNORMAL LOW (ref 4.8–5.6)
Mean Plasma Glucose: 88 mg/dL

## 2018-01-09 MED FILL — buPROPion HCL ER (XL) 300 M: 300 | 90 days supply | Qty: 90 | Fill #2

## 2018-01-09 MED FILL — ANASTROZOLE 1 MG TABLET: 1 | 90 days supply | Qty: 90 | Fill #1

## 2018-01-19 ENCOUNTER — Telehealth (HOSPITAL_COMMUNITY): Payer: Self-pay | Admitting: Vascular Surgery

## 2018-01-19 NOTE — Telephone Encounter (Signed)
Left pt message to make 6 month f/u w/ echo in April w/ db

## 2018-01-30 IMAGING — NM NM BONE WHOLE BODY
2 series · 2 of 2 positions shown · non-contrast
Comparison: None new line radiographic correlation: CT chest
abdomen pelvis 09/18/2015

CLINICAL DATA: New diagnosed RIGHT breast cancer, history of
arthritis RIGHT knee

EXAM:
NUCLEAR MEDICINE WHOLE BODY BONE SCAN
TECHNIQUE: Whole body anterior and posterior images were obtained approximately
3 hours after intravenous injection of radiopharmaceutical.
RADIOPHARMACEUTICALS:  19.8 mCi Sechnetium-IIm MDP IV

[Series 1: whole body · 2.66mm/px · 1 of 1 slices shown (1 of 2)]
[im 1/1]
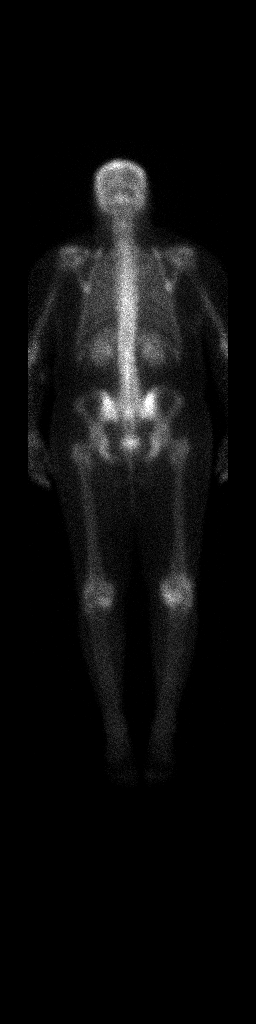

[Series 1: whole body · 2.66mm/px · 1 of 1 slices shown (2 of 2)]
[im 1/1]
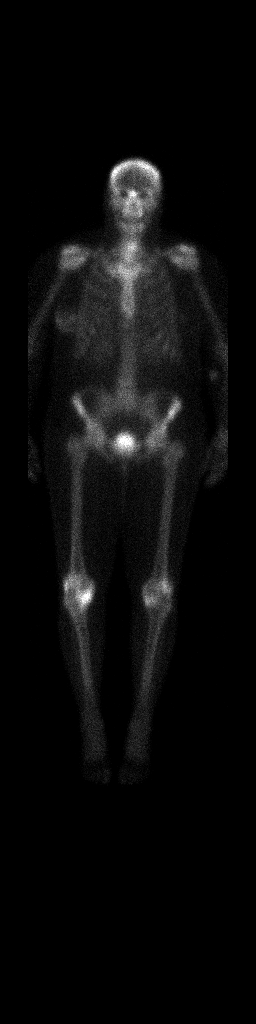

[2 of 2 positions shown; findings below may reference images not displayed]

FINDINGS: Uptake at knees bilaterally RIGHT greater than LEFT likely
degenerative.

No additional sites of abnormal osseous tracer accumulation
identified.

Nonspecific increased tracer localization within the RIGHT breast,
may be related to preceding surgery or breast tumor.

Small focus of tracer accumulation at the soft tissues of the medial
proximal LEFT forearm question injection site.

Expected urinary tract and soft tissue distribution of tracer
otherwise seen.
IMPRESSION: No scintigraphic evidence of osseous metastatic disease.

## 2018-01-30 IMAGING — CT CT CHEST W/ CM
2 of 5 series · 15 of 46 positions shown, 17 images · IV contrast (ISOVUE)
Comparison: Breast MRI 09/12/2015

CLINICAL DATA: Newly diagnosed breast cancer. Multi centric lesions
in the right breast on recent MRI.

EXAM:
CT CHEST, ABDOMEN, AND PELVIS WITH CONTRAST
TECHNIQUE: Multidetector CT imaging of the chest, abdomen and pelvis was
performed following the standard protocol during bolus
administration of intravenous contrast.
CONTRAST:  100mL LLOQHH-BOO IOPAMIDOL (LLOQHH-BOO) INJECTION 61%

[Series 2: cap with st · axial · 0.83mm/px · z∈[-616,-76]mm · 12 of 128 slices shown, 14 images]
[im 10/128  soft-tissue]
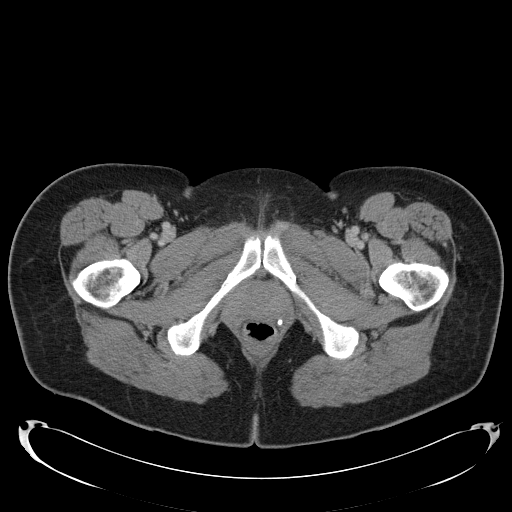
[im 10/128  bone]
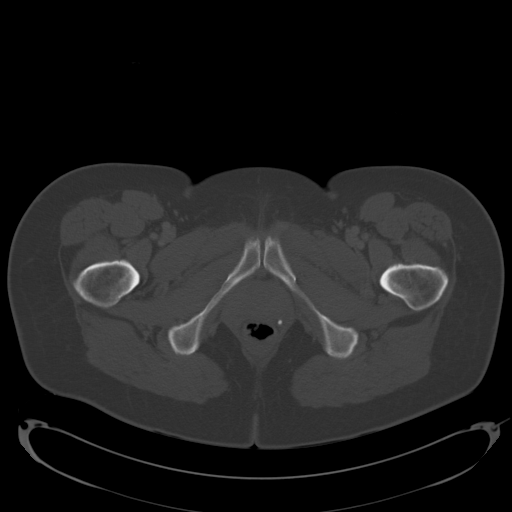
[im 20/128  soft-tissue]
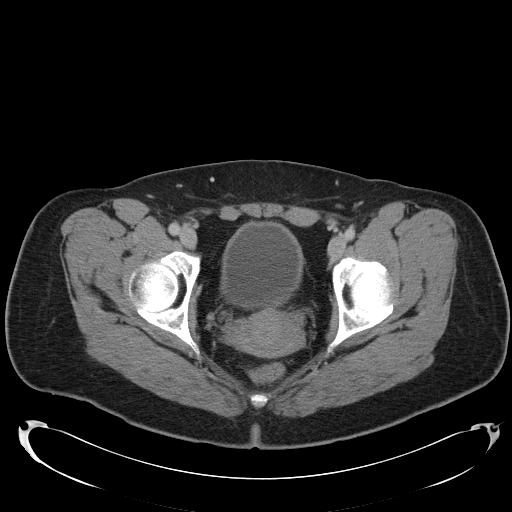
[im 30/128  soft-tissue]
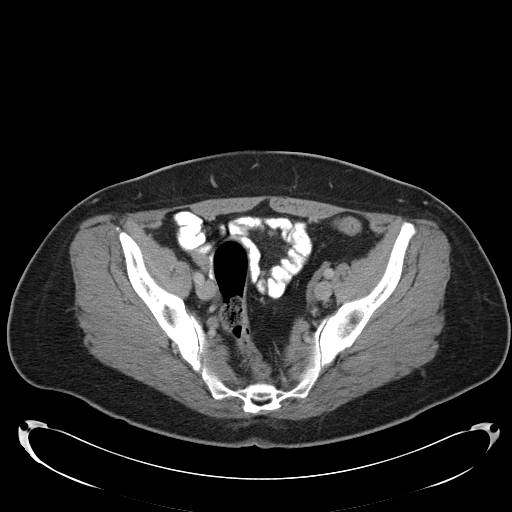
[im 40/128  soft-tissue]
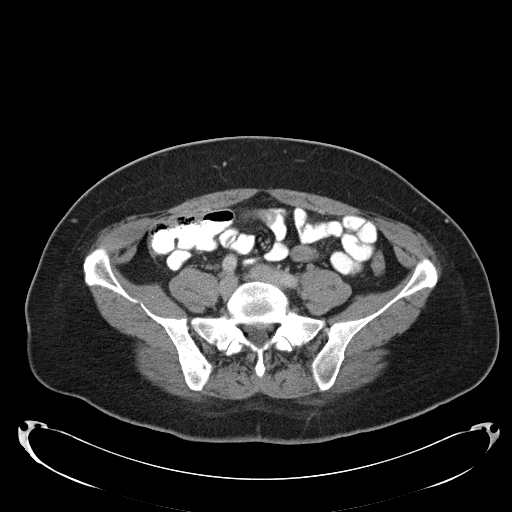
[im 49/128  soft-tissue]
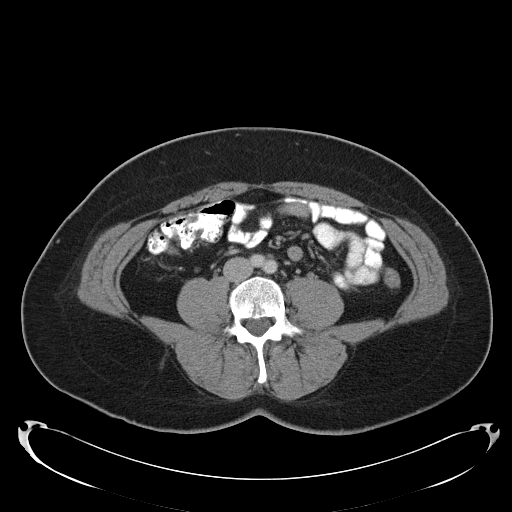
[im 59/128  soft-tissue]
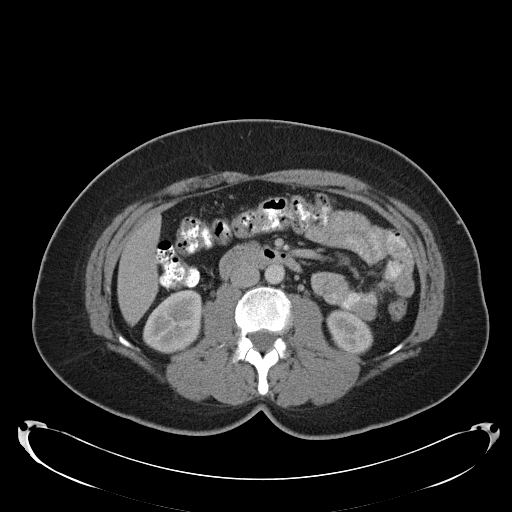
[im 69/128  soft-tissue]
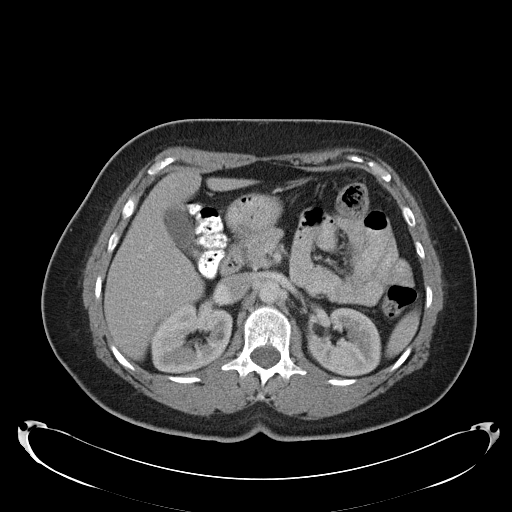
[im 79/128  soft-tissue]
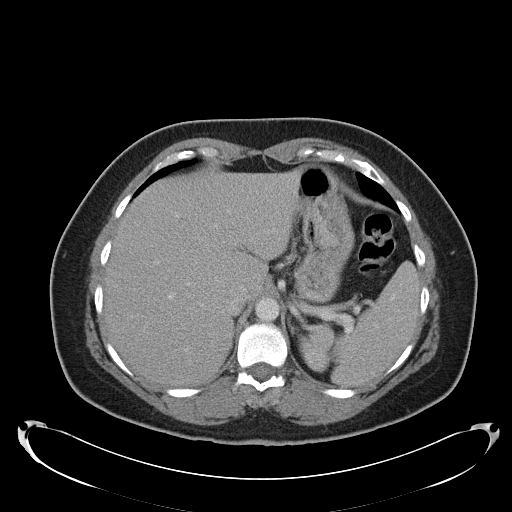
[im 88/128  soft-tissue]
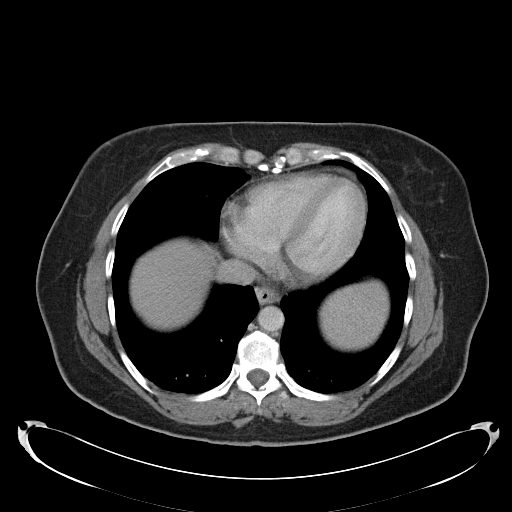
[im 88/128  bone]
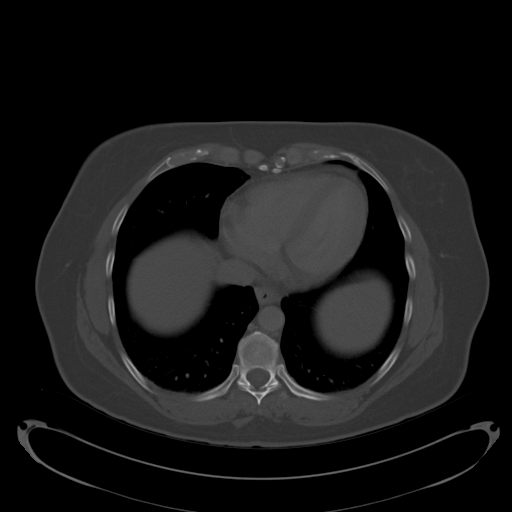
[im 98/128  soft-tissue]
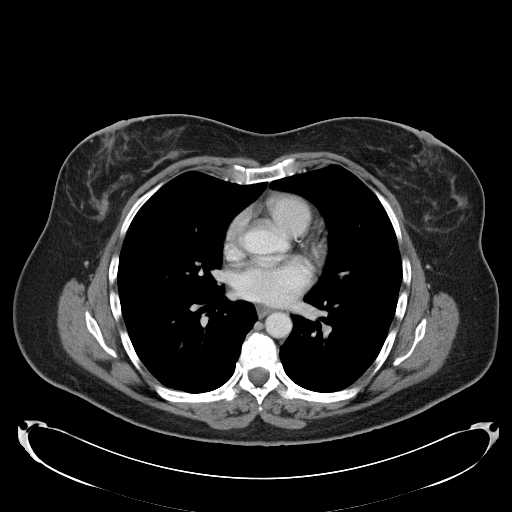
[im 108/128  soft-tissue]
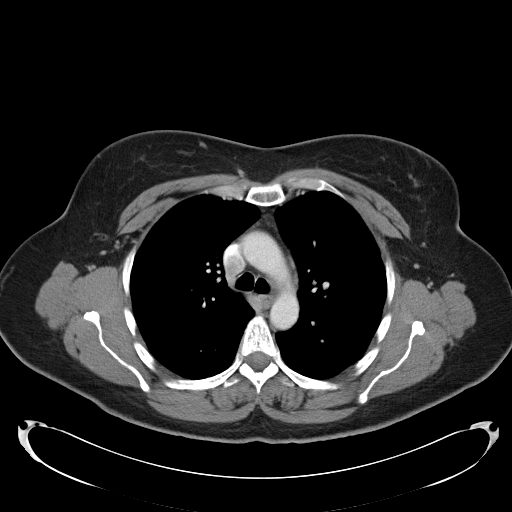
[im 118/128  soft-tissue]
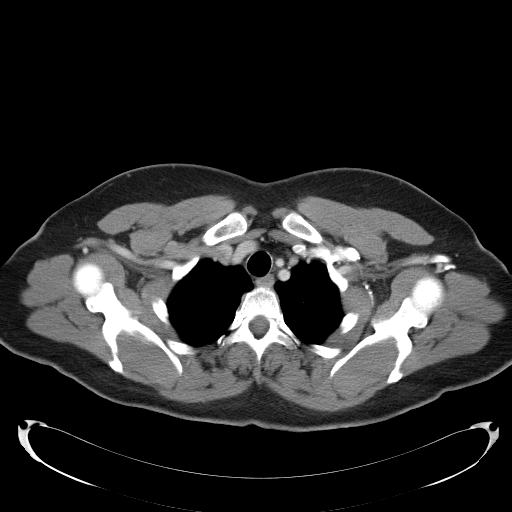

[Series 602: <mpr thick range> · coronal · 1.25mm/px · 3 of 137 slices shown]
[im 46/137  soft-tissue]
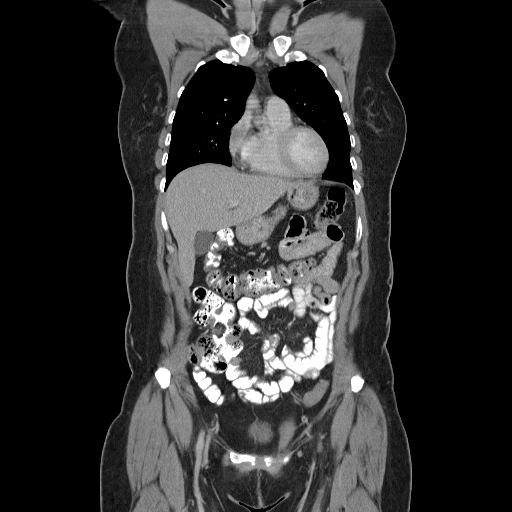
[im 61/137  soft-tissue]
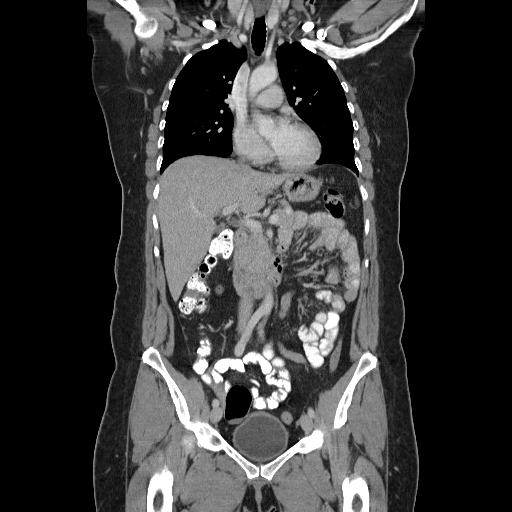
[im 76/137  soft-tissue]
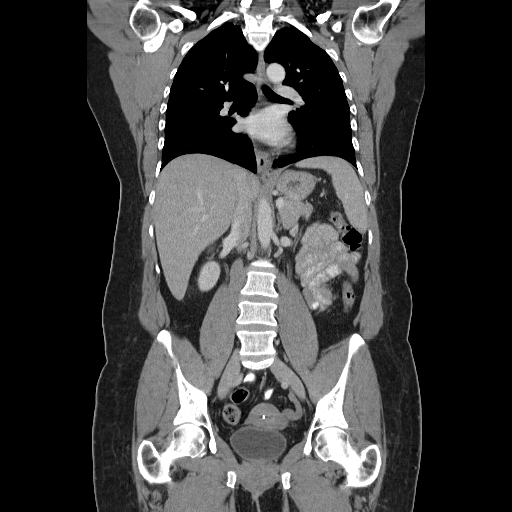

[15 of 46 positions shown; findings below may reference images not displayed]

FINDINGS: CT CHEST FINDINGS

Chest wall: No left-sided breast lesions are identified. Multiple
small lesions noted in the right breast. A biopsy clip is noted in
the lesion which is the deep lateral breast. No enlarged axillary or
supraclavicular lymph nodes. Small scattered lymph nodes are noted.
The thyroid gland is normal.

Cardiovascular: The heart is normal in size. No pericardial
effusion. The aorta is normal in caliber. No dissection.

Mediastinum/Nodes: No mediastinal or hilar mass or lymphadenopathy.
Esophagus is normal.

Lungs/Pleura: No pulmonary lesions to suggest metastatic disease. No
acute pulmonary findings. No pleural effusion. No pleural nodules.

Musculoskeletal: No significant bony findings. No worrisome bone
lesions to suggest metastatic disease.

CT ABDOMEN PELVIS FINDINGS

Hepatobiliary: No focal hepatic lesions or intrahepatic biliary
dilatation. Tiny low-attenuation lesion in the left hepatic lobe
measures 15 Hounsfield units and is consistent with a small cyst.
The gallbladder is normal. No common bile duct dilatation.

Pancreas: No mass, inflammation or ductal dilatation.

Spleen: Normal size.  No focal lesions.

Adrenals/Urinary Tract: The adrenal glands and kidneys are normal.

Stomach/Bowel: The stomach, duodenum, small bowel and colon are
unremarkable. No inflammatory changes, mass lesions or obstructive
findings. The terminal ileum is normal.

Vascular/Lymphatic: The aorta and branch vessels are normal. The
major venous structures are patent. No mesenteric or retroperitoneal
mass or adenopathy.

Reproductive: The uterus and ovaries are normal. Not IUD is noted in
endometrial canal. No complicating features.

Other: No pelvic mass or adenopathy. No free pelvic fluid
collections. No inguinal mass or adenopathy. No abdominal wall
hernia or subcutaneous lesions.

Musculoskeletal: No significant bony findings. No lytic or sclerotic
bone lesions to suggest metastatic disease.
IMPRESSION: 1. Small right breast lesions but no axillary or supraclavicular
lymphadenopathy.
2. No findings for metastatic disease involving the chest, abdomen,
pelvis or bony structures.

## 2018-02-15 DIAGNOSIS — C50411 Malignant neoplasm of upper-outer quadrant of right female breast: Secondary | ICD-10-CM | POA: Diagnosis not present

## 2018-03-21 ENCOUNTER — Other Ambulatory Visit: Payer: Self-pay

## 2018-03-21 DIAGNOSIS — Z170 Estrogen receptor positive status [ER+]: Principal | ICD-10-CM

## 2018-03-21 DIAGNOSIS — C50411 Malignant neoplasm of upper-outer quadrant of right female breast: Secondary | ICD-10-CM

## 2018-03-21 NOTE — Progress Notes (Signed)
Patient Care Team: Fanny Bien, MD as PCP - General (Family Medicine) Rolm Bookbinder, MD as Consulting Physician (General Surgery) Nicholas Lose, MD as Consulting Physician (Hematology and Oncology) Delice Bison, Charlestine Massed, NP as Nurse Practitioner (Hematology and Oncology) Kyung Rudd, MD as Consulting Physician (Radiation Oncology) Bensimhon, Shaune Pascal, MD as Consulting Physician (Cardiology)  DIAGNOSIS:    ICD-10-CM   1. Malignant neoplasm of upper-outer quadrant of right breast in female, estrogen receptor positive ([place]) C50.411    Z17.0     SUMMARY OF ONCOLOGIC HISTORY:   Breast cancer of upper-outer quadrant of right female breast ([place])   09/02/2015 Mammogram    Right breast UOQ: 2 masses 3 cm apart, U/S they measured 3.4 cm, larger mass at 1.6 cm, in addition, 1 cm mass at 9:00 and 0.9 cm mass at 8:30( could be intramammary LN); right axillary LN 11 mm; microcalcs 10 cm span     09/09/2015 Initial Diagnosis    Right breast biopsy OUQ: DCIS with calcs and necrosis ER 95%, PR 80%; 9:30 position 5cmfn: IDC with DCIS ER 95%, PR 65%, Ki-67 20%,; right biopsy 8:30 position 3cmfn ER 95%, PR 5%, Ki-67 10%: intramammary LN with IDC    09/12/2015 Breast MRI    Multicentric right breast cancer cluster of enhancing masses 4.1 x 2.2 x 2.6 cm, LOQ: 1.1 cm enhancing mass previously biopsied, subcutaneous low right breast 2 cm mass not biopsied, multiple other small enhancing masses, 1 cm right axillary lymph node    10/01/2015 Surgery    Right mastectomy: IDC grade 2, multifocal largest 2.5 cm, with high-grade DCIS, broadly present at  anterior margin and inferior margin 2/5 LN positive, LVI Present,  Left mastectomy: ALH, ER 95%, PR 5-65%, HER-2 negative, Ki-67 10-20%, T2 N1 a stage IIB    11/05/2015 - 03/17/2016 Chemotherapy    Adjuvant chemotherapy with dose dense Adriamycin and Cytoxan 4 followed by Abraxane weekly 12    04/22/2016 - 06/08/2016 Radiation Therapy    Adjuvant  radiation therapy    06/22/2016 Surgery    Hysterectomy with bilateral salpingo-oophorectomy: Negative for malignancy    06/30/2016 -  Anti-estrogen oral therapy    Anastrozole 1 mg daily, patient is on a clinical trial [name] ( randomized to Palbociclib)     CHIEF COMPLIANT: Follow-up of anastrozole therapy and Ibrance on [name] clinical trial  INTERVAL HISTORY: Melanie Barber is a 53 y.o. with above-mentioned history of right breast cancer currently on adjuvant anastrozole and Ibrance, as she is a participant in the [place] clinical trial. She presents to the clinic today with her research nurse and is doing well. She had a virus a few weeks ago with severe nausea, vomiting, and diarrhea, but has since recovered. She reports fatigue and notes she has been working a lot and is still taking classes. She reports continuing mild nausea, vaginal dyness, hot flashes, arthralgias, increased anxiety, and mental fogginess that followed chemotherapy. 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 She is very interested in participation in additional clinical trials. Her labs today show: WBC 2.4, Hg 12.7, platelets 122, ANC 1.5.   REVIEW OF SYSTEMS:   Constitutional: Denies fevers, chills or abnormal weight loss (+) fatigue (+) hot flashes Eyes: Denies blurriness of vision Ears, nose, mouth, throat, and face: Denies mucositis or sore throat Respiratory: Denies cough, dyspnea or wheezes Cardiovascular: Denies palpitation, chest discomfort Gastrointestinal: Denies heartburn or change  in bowel habits (+) mild nausea Skin: Denies abnormal skin rashes MSK: (+) arthralgias  GYN: (+) vaginal dryness Lymphatics: Denies new lymphadenopathy or easy bruising Neurological: Denies numbness, tingling or new weaknesses Behavioral/Psych: (+) anxiety (+) mental cloudiness  Extremities: No lower extremity edema Breast: denies  any pain or lumps or nodules in either breasts All other systems were reviewed with the patient and are negative.  I have reviewed the past medical history, past surgical history, social history and family history with the patient and they are unchanged from previous note.  ALLERGIES:  is allergic to keflex [cephalexin] and decadron [dexamethasone].  MEDICATIONS:  Current Outpatient Medications  Medication Sig Dispense Refill  . anastrozole (ARIMIDEX) 1 MG tablet Take 1 tablet (1 mg total) by mouth daily. 90 tablet 3  . Biotin 10 MG TABS Take 10 mg by mouth daily.    Marland Kitchen buPROPion (WELLBUTRIN XL) 300 MG 24 hr tablet Take 1 tablet (300 mg total) by mouth daily.    . Calcium Carbonate-Vitamin D (CALCIUM 600+D PO) Take 1 tablet by mouth daily.    . Investigational palbociclib (IBRANCE) 100 MG capsule [facility] AFT-05 [name] Take 1 capsule (100 mg total) by mouth daily. Take with food. Swallow whole. Do not chew. Take on days 1-21. Repeat every 28 days. 63 capsule 0  . Melatonin 5 MG CAPS Take 5 mg by mouth at bedtime as needed (sleep).    . Multiple Vitamin (MULTIVITAMIN) tablet Take 1 tablet by mouth daily.    . ondansetron (ZOFRAN) 8 MG tablet Take 0.5 tablets (4 mg total) every 8 (eight) hours as needed by mouth for nausea or vomiting. (Patient taking differently: Take 4-8 mg by mouth every 8 (eight) hours as needed for nausea or vomiting. ) 30 tablet 0   No current facility-administered medications for this visit.     PHYSICAL EXAMINATION: ECOG PERFORMANCE STATUS: 1 - Symptomatic but completely ambulatory  Vitals:   03/22/18 1142  BP: 117/76  Pulse: 85  Resp: 18  Temp: 97.9 F (36.6 C)  SpO2: 98%   Filed Weights   03/22/18 1142  Weight: 180 lb 9.6 oz (81.9 kg)    GENERAL: alert, no distress and comfortable SKIN: skin color, texture, turgor are normal, no rashes or significant lesions EYES: normal, Conjunctiva are pink and non-injected, sclera clear OROPHARYNX: no  exudate, no erythema and lips, buccal mucosa, and tongue normal  NECK: supple, thyroid normal size, non-tender, without nodularity LYMPH: no palpable lymphadenopathy in the cervical, axillary or inguinal LUNGS: clear to auscultation and percussion with normal breathing effort HEART: regular rate & rhythm and no murmurs and no lower extremity edema ABDOMEN: abdomen soft, non-tender and normal bowel sounds MUSCULOSKELETAL: no cyanosis of digits and no clubbing  NEURO: alert & oriented x 3 with fluent speech, no focal motor/sensory deficits EXTREMITIES: No lower extremity edema  LABORATORY DATA:  I have reviewed the data as listed CMP Latest Ref Rng & Units 03/22/2018 12/27/2017 10/05/2017  Glucose 70 - 99 mg/dL 78 76 83  BUN 6 - 20 mg/dL _0 Creatinine 0.44 - 1.00 mg/dL 0.77 0.78 0.88  Sodium 135 - 145 mmol/L 140 142 141  Potassium 3.5 - 5.1 mmol/L 4.1 4.0 3.8  Chloride 98 - 111 mmol/L 105 105 105  CO2 22 - 32 mmol/L _1 Calcium 8.9 - 10.3 mg/dL 8.8(L) 9.0 9.3  Total Protein 6.5 - 8.1 g/dL 6.9 6.9 7.2  Total Bilirubin 0.3 - 1.2 mg/dL 0.7 0.5  0.8  Alkaline Phos 38 - 126 U/L 70 65 72  AST 15 - 41 U/L 19 18 21  ALT 0 - 44 U/L 19 15 23    Lab Results  Component Value Date   WBC 2.4 (L) 03/22/2018   HGB 12.7 03/22/2018   HCT 36.0 03/22/2018   MCV 101.4 (H) 03/22/2018   PLT 122 (L) 03/22/2018   NEUTROABS 1.5 (L) 03/22/2018    ASSESSMENT & PLAN:  Malignant neoplasm of upper-outer quadrant of right breast in female, estrogen receptor positive ([facility]) Bilateral mastectomies 10/01/2015 With reconstruction Right mastectomy: IDC grade 2, multifocal largest 2.5 cm, with high-grade DCIS, broadly present at anterior margin and inferior margin 2/5 LN positive, LVI Present,  Left mastectomy: ALH, ER 95%, PR 5-65%, HER-2 negative, Ki-67 10-20%,  Pathologic stage: T2 N1 a stage IIB Mammaprint: High risk CT-CAP and bone scan: No evidence of metastatic disease  ----------------------------------------------------------------------------------------------------------------------------------------------------------------- Treatment summary: 1. Adjuvant chemotherapy with dose dense Adriamycin and Cytoxan 4 followed by Abraxane weekly 12 started 11/05/2015 completed 03/17/2016 3. Adjuvant radiation therapy Completed 06/08/2016 4. Adjuvant antiestrogen therapy with anastrozole 7years  PREVENT trial: [id number]  No toxicities to Study drug -------------------------------------------------------------------------------------------------------------------------------------------------------- Anastrozole Toxicities: No hot flashes or myalgias. Patient is tolerating anastrozole extremely well.  [name] clinical trial:Patient has been randomized to Palbociclibcurrently on 100 mg daily  Palbociclib toxicities: 1. Fatigue: Grade 1 2. 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  She will contact me after some meetings today to discuss the best option for her.  Return to clinic in 3 months for follow-up    No orders of the defined types were placed in this encounter.  The patient has a good understanding of the overall plan. she agrees with it. she will call with any problems that may develop before the next visit here.  Gudena, Vinay, MD 03/22/2018  I, Molly Dorshimer am acting as scribe for Dr. Vinay Gudena.  I have reviewed the above documentation for accuracy and completeness, and I agree with the above.    

## 2018-03-22 ENCOUNTER — Encounter: Payer: Self-pay | Admitting: *Deleted

## 2018-03-22 ENCOUNTER — Inpatient Hospital Stay: Payer: 59 | Attending: Hematology and Oncology

## 2018-03-22 ENCOUNTER — Telehealth: Payer: Self-pay | Admitting: Hematology and Oncology

## 2018-03-22 ENCOUNTER — Other Ambulatory Visit: Payer: Self-pay

## 2018-03-22 ENCOUNTER — Inpatient Hospital Stay (HOSPITAL_BASED_OUTPATIENT_CLINIC_OR_DEPARTMENT_OTHER): Payer: 59 | Admitting: Hematology and Oncology

## 2018-03-22 DIAGNOSIS — C50411 Malignant neoplasm of upper-outer quadrant of right female breast: Secondary | ICD-10-CM | POA: Insufficient documentation

## 2018-03-22 DIAGNOSIS — Z90710 Acquired absence of both cervix and uterus: Secondary | ICD-10-CM | POA: Diagnosis not present

## 2018-03-22 DIAGNOSIS — Z006 Encounter for examination for normal comparison and control in clinical research program: Secondary | ICD-10-CM | POA: Diagnosis not present

## 2018-03-22 DIAGNOSIS — Z90722 Acquired absence of ovaries, bilateral: Secondary | ICD-10-CM | POA: Diagnosis not present

## 2018-03-22 DIAGNOSIS — Z170 Estrogen receptor positive status [ER+]: Secondary | ICD-10-CM | POA: Diagnosis not present

## 2018-03-22 DIAGNOSIS — D701 Agranulocytosis secondary to cancer chemotherapy: Secondary | ICD-10-CM | POA: Insufficient documentation

## 2018-03-22 DIAGNOSIS — Z9011 Acquired absence of right breast and nipple: Secondary | ICD-10-CM | POA: Insufficient documentation

## 2018-03-22 DIAGNOSIS — Z9221 Personal history of antineoplastic chemotherapy: Secondary | ICD-10-CM

## 2018-03-22 DIAGNOSIS — Z79811 Long term (current) use of aromatase inhibitors: Secondary | ICD-10-CM | POA: Diagnosis not present

## 2018-03-22 DIAGNOSIS — Z923 Personal history of irradiation: Secondary | ICD-10-CM | POA: Diagnosis not present

## 2018-03-22 LAB — CMP (CANCER CENTER ONLY)
ALK PHOS: 70 U/L (ref 38–126)
ALT: 19 U/L (ref 0–44)
ANION GAP: 9 (ref 5–15)
AST: 19 U/L (ref 15–41)
Albumin: 4 g/dL (ref 3.5–5.0)
BILIRUBIN TOTAL: 0.7 mg/dL (ref 0.3–1.2)
BUN: 11 mg/dL (ref 6–20)
CALCIUM: 8.8 mg/dL — AB (ref 8.9–10.3)
CO2: 26 mmol/L (ref 22–32)
Chloride: 105 mmol/L (ref 98–111)
Creatinine: 0.77 mg/dL (ref 0.44–1.00)
GFR, Est AFR Am: >60 mL/min (ref >60)
GLUCOSE: 78 mg/dL (ref 70–99)
POTASSIUM: 4.1 mmol/L (ref 3.5–5.1)
Sodium: 140 mmol/L (ref 135–145)
TOTAL PROTEIN: 6.9 g/dL (ref 6.5–8.1)

## 2018-03-22 LAB — CBC WITH DIFFERENTIAL (CANCER CENTER ONLY)
ABS IMMATURE GRANULOCYTES: 0.01 10*3/uL (ref 0.00–0.07)
BASOS PCT: 2 %
Basophils Absolute: 0.1 10*3/uL (ref 0.0–0.1)
EOS ABS: 0 10*3/uL (ref 0.0–0.5)
Eosinophils Relative: 2 %
HCT: 36 % (ref 36.0–46.0)
Hemoglobin: 12.7 g/dL (ref 12.0–15.0)
Immature Granulocytes: 0 %
Lymphocytes Relative: 21 %
Lymphs Abs: 0.5 10*3/uL — ABNORMAL LOW (ref 0.7–4.0)
MCH: 35.8 pg — AB (ref 26.0–34.0)
MCHC: 35.3 g/dL (ref 30.0–36.0)
MCV: 101.4 fL — AB (ref 80.0–100.0)
MONO ABS: 0.3 10*3/uL (ref 0.1–1.0)
MONOS PCT: 14 %
NEUTROS ABS: 1.5 10*3/uL — AB (ref 1.7–7.7)
NEUTROS PCT: 61 %
PLATELETS: 122 10*3/uL — AB (ref 150–400)
RBC: 3.55 MIL/uL — AB (ref 3.87–5.11)
RDW: 12.5 % (ref 11.5–15.5)
WBC Count: 2.4 10*3/uL — ABNORMAL LOW (ref 4.0–10.5)
nRBC: 0 % (ref 0.0–0.2)

## 2018-03-22 MED ORDER — INV-PALBOCICLIB 100 MG CAPS #23 ALLIANCE FOUNDATION AFT-05 (PALLAS): 100.0000 mg | ORAL_CAPSULE | Freq: Every day | ORAL | 0 refills | Status: DC

## 2018-03-22 NOTE — Assessment & Plan Note (Signed)
Bilateral mastectomies 10/01/2015 With reconstruction Right mastectomy: IDC grade 2, multifocal largest 2.5 cm, with high-grade DCIS, broadly present at anterior margin and inferior margin 2/5 LN positive, LVI Present,  Left mastectomy: ALH, ER 95%, PR 5-65%, HER-2 negative, Ki-67 10-20%,  Pathologic stage: T2 N1 a stage IIB Mammaprint: High risk CT-CAP and bone scan: No evidence of metastatic disease ----------------------------------------------------------------------------------------------------------------------------------------------------------------- Treatment summary: 1. Adjuvant chemotherapy with dose dense Adriamycin and Cytoxan 4 followed by Abraxane weekly 12 started 11/05/2015 completed 03/17/2016 3. Adjuvant radiation therapy Completed 06/08/2016 4. Adjuvant antiestrogen therapy with anastrozole 7years  PREVENT trial: [facility] [id number]  No toxicities to Study drug -------------------------------------------------------------------------------------------------------------------------------------------------------- Anastrozole Toxicities: No hot flashes or myalgias. Patient is tolerating anastrozole extremely well.  [name] clinical trial:Patient has been randomized to Palbociclibcurrently on 100 mg daily  Palbociclib toxicities: 1. Fatigue: Much improved with reduced dosage continue 2. Neutropenia: Today's ANC is patient cancontinue100 mg dosage of palbociclib  Return to clinic in 3 months for follow-up

## 2018-03-22 NOTE — Telephone Encounter (Signed)
Gave avs and calendar ° °

## 2018-03-22 NOTE — Research (Signed)
03/22/2018 - [name] cycle 21 study notes-The pt was into the cancer center this morning for her cycle21assessments. The pt did returnher cycle18-20 study drug bottles.The 3 bottles were empty.The research nurse tooktheempty bottlesto the pharmacy for the drug accountability checkwith Kennith Center, pharmacist. The pt returnedher cycle 19 and cycle 82mdication diaries.The pt previously returned her cycle 18 diaries on 02/06/2018.  The pt confirmed that she has been 100% compliant with her study drug.The pt's labs were drawn, and the pt's [place]value is1.5(grade 2)and WBC's is2.4(grade 2)The pt's grade 1 low platelets are not clinically significant. The pt was informed that her cycle21can start today without any dose modifications. The pt reports the following AE's as ongoing: fatigue (grade 1), arthralgia (grade 1), hot flashes (grade 2), vaginal dryness (grade 1),andnausea (grade 1).The pt was seen and examined by Dr. GLindi Adie  The ptupdated her current medication list.She confirmed that she still takes naprosyn prn. The pt states that there have been no new medication changes. The pt was given her cycle21-23 palbociclib andanti-hormone diaries to record her daily doses. The pt was dispensed 3 bottles of study drug (1075mdose) assigned by the IRT system ([id number]). The pt was thanked for her continued support of this trial. The pt was given her next appts for cycle 24.   The pt verbalized understanding.  NiBrion AlimentN, BSN, CCMoraviaClinical Research Nurse 03/22/2018 2:12 PM   3/18 /2020 at 4:36pm - The research nurse and Dr. GuLindi Adieet to discuss the pt's new AE's reported today.  Dr. GuLindi Adieelt the pt had a grade 2 anxiety related to work stressors and COVID-19 risk factors.  He did not feel that her anxiety was related to her palbociclib or her arimidex.  Dr. GuLindi Adieescribed "mental fogginess" in his note.  He agreed that the pt AE needed to be  reported as a grade 1 cognitive disturbance.  He did not feel that her cognitive issues were related to her current treatment.   He said that he feels that it is related to her past chemotherapy.  The pt also described a "GI virus" with nausea, vomiting, and diarrhea which started on 03/06/18 through 03/10/18.  He did not feel that this "virus" was related to her palbociclib and arimidex.  He felt that this AE needed to be recorded as a moderate gastrointestinal disorder.  Dr. GuLindi Adietated that he did not want this listed as a GI infection b/c it was not a confirmed infection.    Study/Protocol: [name]/AFT-05 Cycle:18-20 AE'sfrom12/25/19- 03/22/2018  Event Grade Onset  Date Resolved Date Attribution to Palbociclib Attribution to anastrozole Treatment Comments  Fatigue 1 09/16/16 Ongoing Yes  Yes Palbociclib/ anastrozole   Platelet countdecreased 1 07/13/17 Ongoing Yes No Palbociclib/ anastrozole Not clinically significant  Hot flashes 2 10/22/16 Ongoing  No  Yes Palbociclib/ anastrozole   Arthralgia  1 10/08/16 Ongoing No  Yes Palbociclib/ anastrozole   Nausea  1 11/01/16 Ongoing Yes  No Palbociclib/ anastrozole     White blood cell decreased  2 12/27/17 Ongoing  Yes  No Palbociclib/ anastrozole   Neutrophil Count decreased 2 04/20/17 Ongoing  Yes  No Palbociclib/ anastrozole   Vaginal dryness 1 12/27/17 Ongoing No  Yes Palbociclib/ anastrozole   Cognitive disturbance 2 03/22/18 Ongoing No  No Palbociclib / anastrozole described as mental fogginess  Anxiety  2 03/22/18 Ongoing No  No Palbociclib  anastrozole Increased anxiety r/t COVID-19 and work assignments  GI disorder, other virus  2 03/06/18  03/10/18 No  No  Palbociclib  anastrozole Pt self-reported a "virus"

## 2018-03-23 DIAGNOSIS — Z719 Counseling, unspecified: Secondary | ICD-10-CM | POA: Diagnosis not present

## 2018-03-23 DIAGNOSIS — C50919 Malignant neoplasm of unspecified site of unspecified female breast: Secondary | ICD-10-CM | POA: Diagnosis not present

## 2018-03-23 DIAGNOSIS — F331 Major depressive disorder, recurrent, moderate: Secondary | ICD-10-CM | POA: Diagnosis not present

## 2018-03-23 DIAGNOSIS — F411 Generalized anxiety disorder: Secondary | ICD-10-CM | POA: Diagnosis not present

## 2018-03-23 MED FILL — SERTRALINE HCL 25 MG TABLET: 25 | 90 days supply | Qty: 90 | Fill #0

## 2018-04-07 MED FILL — buPROPion HCL ER (XL) 300 M: 300 | 90 days supply | Qty: 90 | Fill #0

## 2018-04-07 MED FILL — ANASTROZOLE 1 MG TABLET: 1 | 90 days supply | Qty: 90 | Fill #0

## 2018-04-11 ENCOUNTER — Encounter (HOSPITAL_COMMUNITY): Payer: 59 | Admitting: Internal Medicine

## 2018-04-11 ENCOUNTER — Ambulatory Visit (HOSPITAL_COMMUNITY): Payer: 59

## 2018-06-05 ENCOUNTER — Encounter: Payer: Self-pay | Admitting: *Deleted

## 2018-06-05 NOTE — Progress Notes (Signed)
06/05/2018 at 11:29am-[name] AFT-05- The research nurse was notified that all ARM 1 patients who are still receiving study drug, palbociclib, are to discontinue taking this drug immediately.  The research nurse called the pt and told her about the study's decision to stop the trial.  The pt was told that she needs to stop taking her palbociclib today.  The pt verbalized understanding.  The pt was upset, and she stated that she is disappointed.  The research nurse offered emotional support to the pt.  The pt was told that the nurse will forward any communication about the study to her.  The pt was encouraged to contact Dr. Lindi Adie, study PI, with any questions/concerns.  The pt has an appointment next week.  The pt was informed to return all of her study drug at this visit. Brion Aliment RN, BSN, CCRP Clinical Research Nurse 06/05/2018 11:35 AM

## 2018-06-06 ENCOUNTER — Other Ambulatory Visit: Payer: Self-pay | Admitting: *Deleted

## 2018-06-06 DIAGNOSIS — C50411 Malignant neoplasm of upper-outer quadrant of right female breast: Secondary | ICD-10-CM

## 2018-06-06 DIAGNOSIS — Z170 Estrogen receptor positive status [ER+]: Secondary | ICD-10-CM

## 2018-06-06 NOTE — Assessment & Plan Note (Deleted)
Bilateral mastectomies 10/01/2015 With reconstruction Right mastectomy: IDC grade 2, multifocal largest 2.5 cm, with high-grade DCIS, broadly present at anterior margin and inferior margin 2/5 LN positive, LVI Present,  Left mastectomy: ALH, ER 95%, PR 5-65%, HER-2 negative, Ki-67 10-20%,  Pathologic stage: T2 N1 a stage IIB Mammaprint: High risk CT-CAP and bone scan: No evidence of metastatic disease ----------------------------------------------------------------------------------------------------------------------------------------------------------------- Treatment summary: 1. Adjuvant chemotherapy with dose dense Adriamycin and Cytoxan 4 followed by Abraxane weekly 12 started 11/05/2015 completed 03/17/2016 3. Adjuvant radiation therapy Completed 06/08/2016 4. Adjuvant antiestrogen therapy with anastrozole 7years  PREVENT trial: [facility] [id number]  No toxicities to Studydrug ------------------------------------------------------------------------------------------------------------------------------------------------------- Anastrozole Toxicities: No hot flashes or myalgias. Patient is tolerating anastrozole extremely well.  [name] clinical trial:Patient has been randomized to Palbociclibon 100 mg daily Because the study interim analysis did not show any benefit to palbociclib, the recommendation is to stop the study drug.  She will be followed regularly for surveillance

## 2018-06-07 ENCOUNTER — Telehealth: Payer: Self-pay | Admitting: Hematology and Oncology

## 2018-06-07 NOTE — Telephone Encounter (Signed)
Moved 6/10 to 6/30 per sch msg. Called and left msg for patient

## 2018-06-14 ENCOUNTER — Ambulatory Visit: Payer: 59 | Admitting: Hematology and Oncology

## 2018-06-14 ENCOUNTER — Other Ambulatory Visit: Payer: 59

## 2018-06-16 ENCOUNTER — Ambulatory Visit (HOSPITAL_COMMUNITY): Payer: 59

## 2018-06-16 ENCOUNTER — Encounter (HOSPITAL_COMMUNITY): Payer: 59 | Admitting: Internal Medicine

## 2018-06-26 ENCOUNTER — Other Ambulatory Visit: Payer: Self-pay | Admitting: Hematology and Oncology

## 2018-06-27 MED FILL — ANASTROZOLE 1 MG TABLET: 1 | 90 days supply | Qty: 90 | Fill #0

## 2018-06-28 DIAGNOSIS — F411 Generalized anxiety disorder: Secondary | ICD-10-CM | POA: Diagnosis not present

## 2018-06-28 DIAGNOSIS — F331 Major depressive disorder, recurrent, moderate: Secondary | ICD-10-CM | POA: Diagnosis not present

## 2018-06-28 DIAGNOSIS — C50919 Malignant neoplasm of unspecified site of unspecified female breast: Secondary | ICD-10-CM | POA: Diagnosis not present

## 2018-06-28 MED FILL — SERTRALINE HCL 25 MG TABLET: 25 | 90 days supply | Qty: 90 | Fill #0

## 2018-06-28 MED FILL — buPROPion HCL ER (XL) 300 M: 300 | 90 days supply | Qty: 90 | Fill #0

## 2018-06-28 NOTE — Assessment & Plan Note (Addendum)
Bilateral mastectomies 10/01/2015 With reconstruction Right mastectomy: IDC grade 2, multifocal largest 2.5 cm, with high-grade DCIS, broadly present at anterior margin and inferior margin 2/5 LN positive, LVI Present,  Left mastectomy: ALH, ER 95%, PR 5-65%, HER-2 negative, Ki-67 10-20%,  Pathologic stage: T2 N1 a stage IIB Mammaprint: High risk CT-CAP and bone scan: No evidence of metastatic disease ----------------------------------------------------------------------------------------------------------------------------------------------------------------- Treatment summary: 1. Adjuvant chemotherapy with dose dense Adriamycin and Cytoxan 4 followed by Abraxane weekly 12 started 11/05/2015 completed 03/17/2016 3. Adjuvant radiation therapy Completed 06/08/2016 4. Adjuvant antiestrogen therapy with anastrozole 7years  PREVENT trial: [id number] [id number]  [name] clinical trial: Off Ibrance because of study did not show benefit -------------------------------------------------------------------------------------------------------------------------------------------------------- Anastrozole Toxicities: No hot flashes or myalgias. Patient is tolerating anastrozole extremely well.  Severe fatigue: COVID tested  Return to clinic in 6 months for follow-up

## 2018-07-03 ENCOUNTER — Encounter: Payer: Self-pay | Admitting: *Deleted

## 2018-07-03 NOTE — Progress Notes (Signed)
Patient Care Team: Fanny Bien, MD as PCP - General (Family Medicine) Rolm Bookbinder, MD as Consulting Physician (General Surgery) Nicholas Lose, MD as Consulting Physician (Hematology and Oncology) Delice Bison, Charlestine Massed, NP as Nurse Practitioner (Hematology and Oncology) Kyung Rudd, MD as Consulting Physician (Radiation Oncology) Bensimhon, Shaune Pascal, MD as Consulting Physician (Cardiology)  DIAGNOSIS:    ICD-10-CM   1. Malignant neoplasm of upper-outer quadrant of right breast in female, estrogen receptor positive ([place])  C50.411    Z17.0     SUMMARY OF ONCOLOGIC HISTORY: Oncology History  Breast cancer of upper-outer quadrant of right female breast ([place])  09/02/2015 Mammogram   Right breast UOQ: 2 masses 3 cm apart, U/S they measured 3.4 cm, larger mass at 1.6 cm, in addition, 1 cm mass at 9:00 and 0.9 cm mass at 8:30( could be intramammary LN); right axillary LN 11 mm; microcalcs 10 cm span    09/09/2015 Initial Diagnosis   Right breast biopsy OUQ: DCIS with calcs and necrosis ER 95%, PR 80%; 9:30 position 5cmfn: IDC with DCIS ER 95%, PR 65%, Ki-67 20%,; right biopsy 8:30 position 3cmfn ER 95%, PR 5%, Ki-67 10%: intramammary LN with IDC   09/12/2015 Breast MRI   Multicentric right breast cancer cluster of enhancing masses 4.1 x 2.2 x 2.6 cm, LOQ: 1.1 cm enhancing mass previously biopsied, subcutaneous low right breast 2 cm mass not biopsied, multiple other small enhancing masses, 1 cm right axillary lymph node   10/01/2015 Surgery   Right mastectomy: IDC grade 2, multifocal largest 2.5 cm, with high-grade DCIS, broadly present at  anterior margin and inferior margin 2/5 LN positive, LVI Present,  Left mastectomy: ALH, ER 95%, PR 5-65%, HER-2 negative, Ki-67 10-20%, T2 N1 a stage IIB   11/05/2015 - 03/17/2016 Chemotherapy   Adjuvant chemotherapy with dose dense Adriamycin and Cytoxan 4 followed by Abraxane weekly 12   04/22/2016 - 06/08/2016 Radiation Therapy   Adjuvant  radiation therapy   06/22/2016 Surgery   Hysterectomy with bilateral salpingo-oophorectomy: Negative for malignancy   06/30/2016 -  Anti-estrogen oral therapy   Anastrozole 1 mg daily, patient is on a clinical trial [name] ( randomized to Palbociclib)     CHIEF COMPLIANT: Follow-up of anastrozole therapy    INTERVAL HISTORY: Melanie Barber is a 54 y.o. with above-mentioned history of right breast cancer currently on adjuvant anastrozole. She presents to the clinic today with her research nurse.  She continues to have persistent problems with fatigue.  She stopped Ibrance June 05, 2018. Recently she had a viral syndrome and got tested for COVID and it is apparently negative.  She is back to work.  She is having a lot of difficulty with fatigue at work.  REVIEW OF SYSTEMS:   Constitutional: Denies fevers, chills or abnormal weight loss, complains of fatigue Eyes: Denies blurriness of vision Ears, nose, mouth, throat, and face: Denies mucositis or sore throat Respiratory: Denies cough, dyspnea or wheezes Cardiovascular: Denies palpitation, chest discomfort Gastrointestinal: Denies nausea, heartburn or change in bowel habits Skin: Denies abnormal skin rashes Lymphatics: Denies new lymphadenopathy or easy bruising Neurological: Denies numbness, tingling or new weaknesses Behavioral/Psych: Mood is stable, no new changes  Extremities: No lower extremity edema Breast: denies any pain or lumps or nodules in either breasts All other systems were reviewed with the patient and are negative.  I have reviewed the past medical history, past surgical history, social history and family history with the patient and they are unchanged from previous note.  ALLERGIES:  is allergic to keflex [cephalexin] and decadron [dexamethasone].  MEDICATIONS:  Current Outpatient Medications  Medication Sig Dispense Refill  . anastrozole (ARIMIDEX) 1 MG tablet TAKE 1 TABLET BY MOUTH ONCE DAILY 90 tablet 0  .  Biotin 10 MG TABS Take 10 mg by mouth daily.    Marland Kitchen buPROPion (WELLBUTRIN XL) 300 MG 24 hr tablet Take 1 tablet (300 mg total) by mouth daily.    . Calcium Carbonate-Vitamin D (CALCIUM 600+D PO) Take 1 tablet by mouth daily.    . Investigational palbociclib (IBRANCE) 100 MG capsule [facility] AFT-05 [name] Take 1 capsule (100 mg total) by mouth daily. Take with food. Swallow whole. Do not chew. Take on days 1-21. Repeat every 28 days. 63 capsule 0  . Melatonin 5 MG CAPS Take 5 mg by mouth at bedtime as needed (sleep).    . Multiple Vitamin (MULTIVITAMIN) tablet Take 1 tablet by mouth daily.    . ondansetron (ZOFRAN) 8 MG tablet Take 0.5 tablets (4 mg total) every 8 (eight) hours as needed by mouth for nausea or vomiting. (Patient taking differently: Take 4-8 mg by mouth every 8 (eight) hours as needed for nausea or vomiting. ) 30 tablet 0   No current facility-administered medications for this visit.     PHYSICAL EXAMINATION: ECOG PERFORMANCE STATUS: 1 - Symptomatic but completely ambulatory  Vitals:   07/04/18 0842  BP: 126/81  Pulse: 100  Resp: 17  Temp: 98.5 F (36.9 C)  SpO2: 98%   Filed Weights   07/04/18 0842  Weight: 184 lb 8 oz (83.7 kg)    GENERAL: alert, no distress and comfortable SKIN: skin color, texture, turgor are normal, no rashes or significant lesions EYES: normal, Conjunctiva are pink and non-injected, sclera clear OROPHARYNX: no exudate, no erythema and lips, buccal mucosa, and tongue normal  NECK: supple, thyroid normal size, non-tender, without nodularity LYMPH: no palpable lymphadenopathy in the cervical, axillary or inguinal LUNGS: clear to auscultation and percussion with normal breathing effort HEART: regular rate & rhythm and no murmurs and no lower extremity edema ABDOMEN: abdomen soft, non-tender and normal bowel sounds MUSCULOSKELETAL: no cyanosis of digits and no clubbing  NEURO: alert & oriented x 3 with fluent speech, no focal  motor/sensory deficits EXTREMITIES: No lower extremity edema  LABORATORY DATA:  I have reviewed the data as listed CMP Latest Ref Rng & Units 03/22/2018 12/27/2017 10/05/2017  Glucose 70 - 99 mg/dL 78 76 83  BUN 6 - 20 mg/dL '11 14 12  '$ Creatinine 0.44 - 1.00 mg/dL 0.77 0.78 0.88  Sodium 135 - 145 mmol/L 140 142 141  Potassium 3.5 - 5.1 mmol/L 4.1 4.0 3.8  Chloride 98 - 111 mmol/L 105 105 105  CO2 22 - 32 mmol/L '26 28 27  '$ Calcium 8.9 - 10.3 mg/dL 8.8(L) 9.0 9.3  Total Protein 6.5 - 8.1 g/dL 6.9 6.9 7.2  Total Bilirubin 0.3 - 1.2 mg/dL 0.7 0.5 0.8  Alkaline Phos 38 - 126 U/L 70 65 72  AST 15 - 41 U/L '19 18 21  '$ ALT 0 - 44 U/L '19 15 23    '$ Lab Results  Component Value Date   WBC 6.1 07/04/2018   HGB 12.7 07/04/2018   HCT 36.7 07/04/2018   MCV 100.3 (H) 07/04/2018   PLT 260 07/04/2018   NEUTROABS 4.3 07/04/2018    ASSESSMENT & PLAN:  Breast cancer of upper-outer quadrant of right female breast ([place]) Bilateral mastectomies 10/01/2015 With reconstruction Right mastectomy: IDC grade 2,  multifocal largest 2.5 cm, with high-grade DCIS, broadly present at anterior margin and inferior margin 2/5 LN positive, LVI Present,  Left mastectomy: ALH, ER 95%, PR 5-65%, HER-2 negative, Ki-67 10-20%,  Pathologic stage: T2 N1 a stage IIB Mammaprint: High risk CT-CAP and bone scan: No evidence of metastatic disease ----------------------------------------------------------------------------------------------------------------------------------------------------------------- Treatment summary: 1. Adjuvant chemotherapy with dose dense Adriamycin and Cytoxan 4 followed by Abraxane weekly 12 started 11/05/2015 completed 03/17/2016 3. Adjuvant radiation therapy Completed 06/08/2016 4. Adjuvant antiestrogen therapy with anastrozole 7years  PREVENT trial: [id number]  [name] clinical trial: Off Ibrance because of study did not show benefit  -------------------------------------------------------------------------------------------------------------------------------------------------------- Anastrozole Toxicities: No hot flashes or myalgias. Patient is tolerating anastrozole extremely well.  Severe fatigue: Most likely related to prior Ibrance.   This was complicated by recent viral syndrome. COVID test: Negative  I discussed with her that she needs to increase her physical activity and exercise regularly so that she can get stronger.  As she goes further away from [place] she is likely to show improvement in her energy levels.  I do not have any concern for disease recurrence.  Return to clinic in 6 months for follow-up   No orders of the defined types were placed in this encounter.  The patient has a good understanding of the overall plan. she agrees with it. she will call with any problems that may develop before the next visit here.  Rulon Eisenmenger, MD 07/04/2018  Julious Oka Dorshimer am acting as scribe for Dr. Nicholas Lose.  I have reviewed the above documentation for accuracy and completeness, and I agree with the above.

## 2018-07-03 NOTE — Progress Notes (Signed)
07/03/2018 at 12:22pm - [name] study- COVID-19 Pandemic Related Documentation and Testing- The pt contacted the research nurse on 06/27/2018, and she said she was having "COVID testing at [facility] tomorrow between 9a-11a".  The research nurse thanked the pt for notifying the nurse about her testing.  The pt reported the following AE's on 06/27/18:  Fever (100.8), fatigue, and "achiness".  The research nurse followed up with the pt on 06/30/2018 to check on her AE's and to see if she was aware of her testing results.  The pt responded on 07/01/2018 that her COVID testing was "negative".  The pt said that she was contacted Friday by [facility] regarding her test results.  The pt said that she has not received any documentation related to her testing, just a phone call.  The pt said that she is starting to feel better now.  The pt is aware of her [name] end of treatment appointments scheduled for 07/04/18.  The pt was told to return all of her study drug and medication diaries to the research nurse tomorrow.  The pt verbalized understanding.   Brion Aliment RN, BSN, CCRP Clinical Research Nurse 07/03/2018 12:31 PM

## 2018-07-04 ENCOUNTER — Inpatient Hospital Stay: Payer: 59 | Attending: Hematology and Oncology

## 2018-07-04 ENCOUNTER — Telehealth: Payer: Self-pay | Admitting: Hematology and Oncology

## 2018-07-04 ENCOUNTER — Inpatient Hospital Stay (HOSPITAL_BASED_OUTPATIENT_CLINIC_OR_DEPARTMENT_OTHER): Payer: 59 | Admitting: Hematology and Oncology

## 2018-07-04 ENCOUNTER — Other Ambulatory Visit: Payer: Self-pay

## 2018-07-04 ENCOUNTER — Encounter: Payer: Self-pay | Admitting: *Deleted

## 2018-07-04 DIAGNOSIS — Z006 Encounter for examination for normal comparison and control in clinical research program: Secondary | ICD-10-CM | POA: Diagnosis not present

## 2018-07-04 DIAGNOSIS — Z79811 Long term (current) use of aromatase inhibitors: Secondary | ICD-10-CM

## 2018-07-04 DIAGNOSIS — Z170 Estrogen receptor positive status [ER+]: Secondary | ICD-10-CM | POA: Diagnosis not present

## 2018-07-04 DIAGNOSIS — C50411 Malignant neoplasm of upper-outer quadrant of right female breast: Secondary | ICD-10-CM | POA: Diagnosis not present

## 2018-07-04 DIAGNOSIS — Z923 Personal history of irradiation: Secondary | ICD-10-CM | POA: Diagnosis not present

## 2018-07-04 DIAGNOSIS — Z9221 Personal history of antineoplastic chemotherapy: Secondary | ICD-10-CM | POA: Insufficient documentation

## 2018-07-04 DIAGNOSIS — Z9013 Acquired absence of bilateral breasts and nipples: Secondary | ICD-10-CM

## 2018-07-04 LAB — CBC WITH DIFFERENTIAL (CANCER CENTER ONLY)
Abs Immature Granulocytes: 0.1 10*3/uL — ABNORMAL HIGH (ref 0.00–0.07)
Basophils Absolute: 0.1 10*3/uL (ref 0.0–0.1)
Basophils Relative: 1 %
Eosinophils Absolute: 0.2 10*3/uL (ref 0.0–0.5)
Eosinophils Relative: 3 %
HCT: 36.7 % (ref 36.0–46.0)
Hemoglobin: 12.7 g/dL (ref 12.0–15.0)
Immature Granulocytes: 2 %
Lymphocytes Relative: 14 %
Lymphs Abs: 0.8 10*3/uL (ref 0.7–4.0)
MCH: 34.7 pg — ABNORMAL HIGH (ref 26.0–34.0)
MCHC: 34.6 g/dL (ref 30.0–36.0)
MCV: 100.3 fL — ABNORMAL HIGH (ref 80.0–100.0)
Monocytes Absolute: 0.6 10*3/uL (ref 0.1–1.0)
Monocytes Relative: 9 %
Neutro Abs: 4.3 10*3/uL (ref 1.7–7.7)
Neutrophils Relative %: 71 %
Platelet Count: 260 10*3/uL (ref 150–400)
RBC: 3.66 MIL/uL — ABNORMAL LOW (ref 3.87–5.11)
RDW: 11.6 % (ref 11.5–15.5)
WBC Count: 6.1 10*3/uL (ref 4.0–10.5)
nRBC: 0 % (ref 0.0–0.2)

## 2018-07-04 LAB — RESEARCH LABS

## 2018-07-04 LAB — CMP (CANCER CENTER ONLY)
ALT: 37 U/L (ref 0–44)
AST: 27 U/L (ref 15–41)
Albumin: 4 g/dL (ref 3.5–5.0)
Alkaline Phosphatase: 80 U/L (ref 38–126)
Anion gap: 11 (ref 5–15)
BUN: 16 mg/dL (ref 6–20)
CO2: 24 mmol/L (ref 22–32)
Calcium: 8.9 mg/dL (ref 8.9–10.3)
Chloride: 105 mmol/L (ref 98–111)
Creatinine: 0.85 mg/dL (ref 0.44–1.00)
GFR, Est AFR Am: >60 mL/min (ref >60)
GFR, Estimated: >60 mL/min (ref >60)
Glucose, Bld: 82 mg/dL (ref 70–99)
Potassium: 4.1 mmol/L (ref 3.5–5.1)
Sodium: 140 mmol/L (ref 135–145)
Total Bilirubin: 0.7 mg/dL (ref 0.3–1.2)
Total Protein: 7 g/dL (ref 6.5–8.1)

## 2018-07-04 LAB — HEMOGLOBIN A1C
Hgb A1c MFr Bld: 4.6 % — ABNORMAL LOW (ref 4.8–5.6)
Mean Plasma Glucose: 85.32 mg/dL

## 2018-07-04 MED ORDER — SERTRALINE HCL 25 MG PO TABS: 25.0000 mg | ORAL_TABLET | Freq: Every day | ORAL | Status: DC

## 2018-07-04 NOTE — Telephone Encounter (Signed)
Gave avs and calendar ° °

## 2018-07-04 NOTE — Research (Signed)
07/04/2018 at 9:48am - [name] study- End of Treatment visit.  The pt was into the cancer center this morning for her End of Treatment visit on the [name] study.  The pt was notified on 06/05/2018 that the study wanted all subjects on palbociclib to stop the study drug immediately.  The pt said that her last dose of palbociclib was on 06/04/18.  The pt returned her cycle 21-23 study drug bottles to the research nurse.  Her cycle 21 and 22 bottles were empty.  The pt's cycle 23 had 2 remaining pills inside.  The research nurse took the study drug bottles to the pharmacy for the drug accountability check.  Kennith Center, pharmacist, received the 3 bottles.  The pt also returned her cycles 21-23 drug diaries to the research nurse.  The pt was seen and examined by Dr. Lindi Adie this morning.  Dr. Lindi Adie answered all of her questions about the study closure.  The pt reviewed her current medication list and updated the list.  The pt said that she began Zoloft 25 mg in mid-March (~03/19/18) due to her fears/concerns/anxiety about the coronavirus.  The pt confirmed that she is no longer taking the following medications:  Biotin and Zofran.  She reports still taking naproxen sodium as needed.  The pt reported a "flu-like virus" that began last week on 06/27/18, and she said that her flu like symptoms have resolved today.  She was tested for COVID-19, but it was negative.  The pt was asked to obtain a copy of her COVID tests results for research purposes.   The pt reports the following AE's as ongoing:  Increased fatigue, arthralgias (mainly in her knees), hot flashes, anxiety, mild cognitive impairment, and vaginal dryness.  The pt denies any nausea symptoms.  Dr. Lindi Adie stated that her low white blood cells, low ANC, and her low platelets have resolved since she discontinued her palbociclib 30 days ago.  Dr. Lindi Adie felt that the pt's increased fatigue was most likely related to her palbociclib.  Dr. Lindi Adie did not feel that the pt's  flu like symptoms were related to either her palbociclib or her anastrozole. The pt agreed to remain on the study and continue her participation in the study in the follow up phase 1.  The pt was thanked for her support of this trial.  The research nurse completed the IRT transaction ending the pt's study drug supply.  Brion Aliment RN, BSN, CCRP  Clinical Research Nurse 07/04/2018 10:01 AM   Study/Protocol: [name]/AFT-05 - End of Treatment Visit Cycle:21-23 AE'sfrom3/19/20 -07/04/2018             Event Grade Onset  Date Resolved Date Attribution to Palbociclib Attribution to anastrozole Treatment Comments  Fatigue 1 09/16/16 06/26/2018 Yes  Yes Palbociclib/ anastrozole   Platelet countdecreased 1 07/13/17 07/04/2018 Yes No Palbociclib/ anastrozole Not clinically significant  Hot flashes 2 10/22/16 Ongoing  No  Yes Palbociclib/ anastrozole   Arthralgia  1 10/08/16 Ongoing No  Yes Palbociclib/ anastrozole mainly in her knees  Nausea  1 11/01/16 07/04/2018 Yes  No Palbociclib/ anastrozole     White blood cell decreased  2 12/27/17 07/04/2018  Yes  No Palbociclib/ anastrozole   Neutrophil Count decreased 2 04/20/17 07/04/2018 Yes  No Palbociclib/ anastrozole   Vaginal dryness 1 12/27/17 Ongoing No  Yes Palbociclib/ anastrozole   Cognitive disturbance 2 03/22/18 Ongoing No  No Palbociclib/anastrozole described as mental fogginess  Anxiety  2 03/22/18 Ongoing No  No Palbociclib  anastrozole Increased  anxiety r/t COVID-19 and work assignments  Fatigue 2 07/04/18  ongoing Yes No  Palbociclib  anastrozole   Flu like symptoms 2 06/27/2018 07/04/2018 No  No Palbociclib  anastrozole COVID-19 test was negative per pt report

## 2018-08-23 DIAGNOSIS — Z0000 Encounter for general adult medical examination without abnormal findings: Secondary | ICD-10-CM | POA: Diagnosis not present

## 2018-09-14 DIAGNOSIS — M1711 Unilateral primary osteoarthritis, right knee: Secondary | ICD-10-CM | POA: Diagnosis not present

## 2018-09-14 DIAGNOSIS — M25561 Pain in right knee: Secondary | ICD-10-CM | POA: Diagnosis not present

## 2018-09-27 ENCOUNTER — Other Ambulatory Visit: Payer: Self-pay | Admitting: Hematology and Oncology

## 2018-09-27 MED FILL — ANASTROZOLE 1 MG TABLET: 1 | 90 days supply | Qty: 90 | Fill #0

## 2018-09-27 MED FILL — buPROPion HCL ER (XL) 300 M: 300 | 90 days supply | Qty: 90 | Fill #1

## 2018-09-27 MED FILL — SERTRALINE HCL 25 MG TABLET: 25 | 90 days supply | Qty: 90 | Fill #1

## 2018-10-08 DIAGNOSIS — Z20828 Contact with and (suspected) exposure to other viral communicable diseases: Secondary | ICD-10-CM | POA: Diagnosis not present

## 2018-10-08 DIAGNOSIS — Z1159 Encounter for screening for other viral diseases: Secondary | ICD-10-CM | POA: Diagnosis not present

## 2018-10-12 DIAGNOSIS — M549 Dorsalgia, unspecified: Secondary | ICD-10-CM | POA: Diagnosis not present

## 2018-10-12 DIAGNOSIS — Z0000 Encounter for general adult medical examination without abnormal findings: Secondary | ICD-10-CM | POA: Diagnosis not present

## 2018-10-12 DIAGNOSIS — M170 Bilateral primary osteoarthritis of knee: Secondary | ICD-10-CM | POA: Diagnosis not present

## 2018-10-12 DIAGNOSIS — H6123 Impacted cerumen, bilateral: Secondary | ICD-10-CM | POA: Diagnosis not present

## 2018-10-12 DIAGNOSIS — E782 Mixed hyperlipidemia: Secondary | ICD-10-CM | POA: Diagnosis not present

## 2018-10-12 DIAGNOSIS — Z23 Encounter for immunization: Secondary | ICD-10-CM | POA: Diagnosis not present

## 2018-10-18 ENCOUNTER — Telehealth: Payer: Self-pay | Admitting: *Deleted

## 2018-10-18 NOTE — Telephone Encounter (Signed)
10/18/18 - REMEMBER Study- Research nurse called the pt to see if she was interested in participating in the study.  The study is getting close to meeting accrual, and the nurse wanted to see if the pt wanted to enroll in the study before it closes.  Will await pt's return call.  Of note, pt is currently taking a medication that is on the list as a possible interaction with the study drug.  Will inform the pt about her moderate risk QTc prolongation medication and the special study considerations if the pt enrolls on the study and continues to take her current medication (sertraline).   Brion Aliment RN, BSN, CCRP Clinical Research Nurse 10/18/2018 1:51 PM

## 2018-11-07 DIAGNOSIS — R945 Abnormal results of liver function studies: Secondary | ICD-10-CM | POA: Diagnosis not present

## 2018-11-07 DIAGNOSIS — Z0000 Encounter for general adult medical examination without abnormal findings: Secondary | ICD-10-CM | POA: Diagnosis not present

## 2018-11-17 DIAGNOSIS — F411 Generalized anxiety disorder: Secondary | ICD-10-CM | POA: Diagnosis not present

## 2018-11-17 DIAGNOSIS — F331 Major depressive disorder, recurrent, moderate: Secondary | ICD-10-CM | POA: Diagnosis not present

## 2018-11-17 DIAGNOSIS — C50919 Malignant neoplasm of unspecified site of unspecified female breast: Secondary | ICD-10-CM | POA: Diagnosis not present

## 2018-11-17 DIAGNOSIS — M170 Bilateral primary osteoarthritis of knee: Secondary | ICD-10-CM | POA: Diagnosis not present

## 2018-12-04 DIAGNOSIS — Z9012 Acquired absence of left breast and nipple: Secondary | ICD-10-CM | POA: Diagnosis not present

## 2018-12-04 DIAGNOSIS — C50911 Malignant neoplasm of unspecified site of right female breast: Secondary | ICD-10-CM | POA: Diagnosis not present

## 2018-12-06 DIAGNOSIS — C50911 Malignant neoplasm of unspecified site of right female breast: Secondary | ICD-10-CM | POA: Diagnosis not present

## 2018-12-06 DIAGNOSIS — Z9012 Acquired absence of left breast and nipple: Secondary | ICD-10-CM | POA: Diagnosis not present

## 2018-12-25 MED FILL — BUPROPION HCL XL 300 MG TAB: 300 | 90 days supply | Qty: 90 | Fill #2

## 2018-12-25 MED FILL — SERTRALINE HCL 25 MG TABLET: 25 | 90 days supply | Qty: 90 | Fill #2

## 2018-12-25 MED FILL — ANASTROZOLE 1 MG TABLET: 1 | 90 days supply | Qty: 90 | Fill #1

## 2019-01-08 ENCOUNTER — Telehealth: Payer: Self-pay | Admitting: Hematology and Oncology

## 2019-01-08 NOTE — Telephone Encounter (Signed)
Returned patient's phone call regarding cancelling 01/06 appointment, per patient's request appointment has been cancelled. Patient will return phone call when ready to reschedule.

## 2019-01-10 ENCOUNTER — Inpatient Hospital Stay: Payer: 59 | Admitting: Hematology and Oncology

## 2019-01-10 ENCOUNTER — Inpatient Hospital Stay: Payer: 59

## 2019-01-23 DIAGNOSIS — M25561 Pain in right knee: Secondary | ICD-10-CM | POA: Diagnosis not present

## 2019-01-23 DIAGNOSIS — M1711 Unilateral primary osteoarthritis, right knee: Secondary | ICD-10-CM | POA: Diagnosis not present

## 2019-02-20 ENCOUNTER — Inpatient Hospital Stay: Payer: 59 | Admitting: Hematology and Oncology

## 2019-02-22 ENCOUNTER — Encounter: Payer: Self-pay | Admitting: *Deleted

## 2019-02-22 DIAGNOSIS — Z170 Estrogen receptor positive status [ER+]: Secondary | ICD-10-CM

## 2019-02-22 DIAGNOSIS — C50411 Malignant neoplasm of upper-outer quadrant of right female breast: Secondary | ICD-10-CM

## 2019-02-22 NOTE — Research (Signed)
02/22/2019 at 12:01pm - AFT-05/ [name] - month 30 study notes- The pt was scheduled to see Dr. Lindi Adie on 02/20/2019 for an in-person visit (pt's preference).  However, the pt contacted Dr. Lindi Adie the morning of the visit, and she canceled her appt b/c she had a headache from working long hours.  The research nurse called the pt to re-schedule the visit.   The pt was told that the month 30 visit could be a phone contact only visit for study purposes.  The pt called the research nurse back on 02/21/19 in the evening and left a message with an update on her status.  The pt stated she  is working many hours now and will try to re-schedule in the near future.  Patient continues anti-hormone therapy with anastrozole, and she denies any new anti-cancer medications.  She also denies any serious adverse events since her last study visit.  At present, there is no known evidence of breast cancer recurrence.  The pt will be scheduled for her month 36 in-person visit for August 2021. Brion Aliment RN, BSN, CCRP Clinical Research Nurse 02/22/2019 12:10 PM

## 2019-02-26 ENCOUNTER — Telehealth: Payer: Self-pay | Admitting: Hematology and Oncology

## 2019-02-26 NOTE — Telephone Encounter (Signed)
Scheduled 8/16 appt per 2/18 sch msg. Left voicemail with appt details. Also mailed out reminder letter and appt calendar.

## 2019-03-28 MED FILL — ANASTROZOLE 1 MG TABLET: 1 | 90 days supply | Qty: 90 | Fill #2

## 2019-03-28 MED FILL — SERTRALINE HCL 25 MG TABLET: 25 | 90 days supply | Qty: 90 | Fill #3

## 2019-03-28 MED FILL — BUPROPION HCL ER (XL) 300 M: 300 | 90 days supply | Qty: 90 | Fill #3

## 2019-07-03 MED FILL — ANASTROZOLE 1 MG TABLET: 1 | 90 days supply | Qty: 90 | Fill #3

## 2019-07-16 DIAGNOSIS — F411 Generalized anxiety disorder: Secondary | ICD-10-CM | POA: Diagnosis not present

## 2019-07-16 DIAGNOSIS — C50919 Malignant neoplasm of unspecified site of unspecified female breast: Secondary | ICD-10-CM | POA: Diagnosis not present

## 2019-07-16 DIAGNOSIS — F331 Major depressive disorder, recurrent, moderate: Secondary | ICD-10-CM | POA: Diagnosis not present

## 2019-07-16 DIAGNOSIS — G4700 Insomnia, unspecified: Secondary | ICD-10-CM | POA: Diagnosis not present

## 2019-07-16 DIAGNOSIS — M170 Bilateral primary osteoarthritis of knee: Secondary | ICD-10-CM | POA: Diagnosis not present

## 2019-07-16 MED FILL — BUPROPION HCL ER (XL) 300 M: 300 | 90 days supply | Qty: 90 | Fill #0

## 2019-08-13 ENCOUNTER — Telehealth: Payer: Self-pay | Admitting: Hematology and Oncology

## 2019-08-13 NOTE — Telephone Encounter (Signed)
Per 8/9 sch msg, cancel 8/16 appt per patient request. Called and confirmed with patient. Will call back to reschedule

## 2019-08-20 ENCOUNTER — Ambulatory Visit: Payer: 59 | Admitting: Hematology and Oncology

## 2019-08-22 NOTE — Progress Notes (Signed)
Patient Care Team: Fanny Bien, MD as PCP - General (Family Medicine) Rolm Bookbinder, MD as Consulting Physician (General Surgery) Nicholas Lose, MD as Consulting Physician (Hematology and Oncology) Delice Bison, Charlestine Massed, NP as Nurse Practitioner (Hematology and Oncology) Kyung Rudd, MD as Consulting Physician (Radiation Oncology) Bensimhon, Shaune Pascal, MD as Consulting Physician (Cardiology)  DIAGNOSIS:    ICD-10-CM   1. Malignant neoplasm of upper-outer quadrant of right breast in female, estrogen receptor positive ([place])  C50.411    Z17.0     SUMMARY OF ONCOLOGIC HISTORY: Oncology History  Breast cancer of upper-outer quadrant of right female breast ([place])  09/02/2015 Mammogram   Right breast UOQ: 2 masses 3 cm apart, U/S they measured 3.4 cm, larger mass at 1.6 cm, in addition, 1 cm mass at 9:00 and 0.9 cm mass at 8:30( could be intramammary LN); right axillary LN 11 mm; microcalcs 10 cm span    09/09/2015 Initial Diagnosis   Right breast biopsy OUQ: DCIS with calcs and necrosis ER 95%, PR 80%; 9:30 position 5cmfn: IDC with DCIS ER 95%, PR 65%, Ki-67 20%,; right biopsy 8:30 position 3cmfn ER 95%, PR 5%, Ki-67 10%: intramammary LN with IDC   09/12/2015 Breast MRI   Multicentric right breast cancer cluster of enhancing masses 4.1 x 2.2 x 2.6 cm, LOQ: 1.1 cm enhancing mass previously biopsied, subcutaneous low right breast 2 cm mass not biopsied, multiple other small enhancing masses, 1 cm right axillary lymph node   10/01/2015 Surgery   Right mastectomy: IDC grade 2, multifocal largest 2.5 cm, with high-grade DCIS, broadly present at  anterior margin and inferior margin 2/5 LN positive, LVI Present,  Left mastectomy: ALH, ER 95%, PR 5-65%, HER-2 negative, Ki-67 10-20%, T2 N1 a stage IIB   11/05/2015 - 03/17/2016 Chemotherapy   Adjuvant chemotherapy with dose dense Adriamycin and Cytoxan 4 followed by Abraxane weekly 12   04/22/2016 - 06/08/2016 Radiation Therapy   Adjuvant  radiation therapy   06/22/2016 Surgery   Hysterectomy with bilateral salpingo-oophorectomy: Negative for malignancy   06/30/2016 -  Anti-estrogen oral therapy   Anastrozole 1 mg daily, patient is on a clinical trial [name] ( randomized to Palbociclib)     CHIEF COMPLIANT: Follow-up of right breast cancer on anastrozole therapy    INTERVAL HISTORY: Melanie Barber is a 54 y.o. with above-mentioned history of right breast cancer currently on anastrozole.She presents to the clinic todayfor follow-up.   ALLERGIES:  is allergic to keflex [cephalexin] and decadron [dexamethasone].  MEDICATIONS:  Current Outpatient Medications  Medication Sig Dispense Refill  . anastrozole (ARIMIDEX) 1 MG tablet TAKE 1 TABLET BY MOUTH ONCE DAILY 90 tablet 3  . buPROPion (WELLBUTRIN XL) 300 MG 24 hr tablet Take 1 tablet (300 mg total) by mouth daily.    . Calcium Carbonate-Vitamin D (CALCIUM 600+D PO) Take 1 tablet by mouth daily.    . Melatonin 5 MG CAPS Take 5 mg by mouth at bedtime as needed (sleep).    . Multiple Vitamin (MULTIVITAMIN) tablet Take 1 tablet by mouth daily.    . sertraline (ZOLOFT) 25 MG tablet Take 1 tablet (25 mg total) by mouth daily.     No current facility-administered medications for this visit.    PHYSICAL EXAMINATION: ECOG PERFORMANCE STATUS: 1 - Symptomatic but completely ambulatory  Vitals:   08/23/19 1535  BP: 119/78  Pulse: 90  Resp: 20  Temp: (!) 97.2 F (36.2 C)  SpO2: 100%   Filed Weights   08/23/19 1535  Weight: 167 lb 12.8 oz (76.1 kg)    BREAST: No palpable masses or nodules in either right or left breasts. No palpable axillary supraclavicular or infraclavicular adenopathy no breast tenderness or nipple discharge. (exam performed in the presence of a chaperone)  LABORATORY DATA:  I have reviewed the data as listed CMP Latest Ref Rng & Units 07/04/2018 03/22/2018 12/27/2017  Glucose 70 - 99 mg/dL 82 78 76  BUN 6 - 20 mg/dL $Remove'16 11 14  'kBGdovu$ Creatinine 0.44 -  1.00 mg/dL 0.85 0.77 0.78  Sodium 135 - 145 mmol/L 140 140 142  Potassium 3.5 - 5.1 mmol/L 4.1 4.1 4.0  Chloride 98 - 111 mmol/L 105 105 105  CO2 22 - 32 mmol/L $RemoveB'24 26 28  'tTviiOES$ Calcium 8.9 - 10.3 mg/dL 8.9 8.8(L) 9.0  Total Protein 6.5 - 8.1 g/dL 7.0 6.9 6.9  Total Bilirubin 0.3 - 1.2 mg/dL 0.7 0.7 0.5  Alkaline Phos 38 - 126 U/L 80 70 65  AST 15 - 41 U/L $Remo'27 19 18  'RzFun$ ALT 0 - 44 U/L 37 19 15    Lab Results  Component Value Date   WBC 6.1 07/04/2018   HGB 12.7 07/04/2018   HCT 36.7 07/04/2018   MCV 100.3 (H) 07/04/2018   PLT 260 07/04/2018   NEUTROABS 4.3 07/04/2018    ASSESSMENT & PLAN:  Breast cancer of upper-outer quadrant of right female breast ([facility]) Bilateral mastectomies 10/01/2015 With reconstruction Right mastectomy: IDC grade 2, multifocal largest 2.5 cm, with high-grade DCIS, broadly present at anterior margin and inferior margin 2/5 LN positive, LVI Present,  Left mastectomy: ALH, ER 95%, PR 5-65%, HER-2 negative, Ki-67 10-20%,  Pathologic stage: T2 N1 a stage IIB Mammaprint: High risk CT-CAP and bone scan: No evidence of metastatic disease ----------------------------------------------------------------------------------------------------------------------------------------------------------------- Treatment summary: 1. Adjuvant chemotherapy with dose dense Adriamycin and Cytoxan 4 followed by Abraxane weekly 12 started 11/05/2015 completed 03/17/2016 3.Adjuvant radiation therapy Completed 06/08/2016 4.Adjuvant antiestrogen therapy with anastrozole 7years started 06/30/2016  PREVENT trial: [id number]  [name] clinical trial: Off Ibrance because of study did not show benefit -------------------------------------------------------------------------------------------------------------------------------------------------------- Anastrozole Toxicities: No hot flashes or myalgias. Patient is tolerating anastrozole extremely well.  Fatigue: Related to her high  intensity of work Memory issues: I sent a message to Dr. Mickeal Skinner to see if he would consult on her Vaginal dryness: Related to anastrozole   No clinical concern for disease recurrence. I renewed her prescription for anastrozole. Return to clinic in 1 year for follow-up    No orders of the defined types were placed in this encounter.  The patient has a good understanding of the overall plan. she agrees with it. she will call with any problems that may develop before the next visit here.  Total time spent: 20 mins including face to face time and time spent for planning, charting and coordination of care  Nicholas Lose, MD 08/23/2019  I, Cloyde Reams Dorshimer, am acting as scribe for Dr. Nicholas Lose.  I have reviewed the above documentation for accuracy and completeness, and I agree with the above.

## 2019-08-23 ENCOUNTER — Other Ambulatory Visit: Payer: Self-pay | Admitting: Hematology and Oncology

## 2019-08-23 ENCOUNTER — Encounter: Payer: Self-pay | Admitting: *Deleted

## 2019-08-23 ENCOUNTER — Other Ambulatory Visit: Payer: Self-pay

## 2019-08-23 ENCOUNTER — Inpatient Hospital Stay: Payer: 59 | Attending: Hematology and Oncology | Admitting: Hematology and Oncology

## 2019-08-23 DIAGNOSIS — Z170 Estrogen receptor positive status [ER+]: Secondary | ICD-10-CM | POA: Diagnosis not present

## 2019-08-23 DIAGNOSIS — Z90710 Acquired absence of both cervix and uterus: Secondary | ICD-10-CM | POA: Diagnosis not present

## 2019-08-23 DIAGNOSIS — Z923 Personal history of irradiation: Secondary | ICD-10-CM | POA: Diagnosis not present

## 2019-08-23 DIAGNOSIS — Z9013 Acquired absence of bilateral breasts and nipples: Secondary | ICD-10-CM | POA: Insufficient documentation

## 2019-08-23 DIAGNOSIS — C50411 Malignant neoplasm of upper-outer quadrant of right female breast: Secondary | ICD-10-CM | POA: Diagnosis not present

## 2019-08-23 DIAGNOSIS — R413 Other amnesia: Secondary | ICD-10-CM | POA: Diagnosis not present

## 2019-08-23 DIAGNOSIS — Z79811 Long term (current) use of aromatase inhibitors: Secondary | ICD-10-CM | POA: Diagnosis not present

## 2019-08-23 DIAGNOSIS — Z9221 Personal history of antineoplastic chemotherapy: Secondary | ICD-10-CM | POA: Insufficient documentation

## 2019-08-23 DIAGNOSIS — Z006 Encounter for examination for normal comparison and control in clinical research program: Secondary | ICD-10-CM | POA: Diagnosis not present

## 2019-08-23 MED ORDER — ANASTROZOLE 1 MG PO TABS: 1.0000 mg | ORAL_TABLET | Freq: Every day | ORAL | 3 refills | Status: DC

## 2019-08-23 NOTE — Research (Signed)
08/23/2019 at 4:38pm - [name] study note- month 36 visit and reconsent visit (ICF version 8, dated 02/21/2019) The pt was into the cancer center this afternoon for her month 36 follow up visit.  The pt was greeted upon arrival and given her 2 questionnaires/PRO booklets due at this time point.  The pt completed her questionnaire booklets before she was seen by Dr. Lindi Adie.  The research nurse reviewed both of the questionnaire/PRO booklets for accuracy and completeness.  The pt the confirmed that she received the [name] patient letter in March 2021.  She then was told that the study has a new ICF version 8, dated 02/21/2019.  The pt was informed about all of the changes/updates in the new consent form.  The pt was told that the study follow up phase II was updated to include the addition of blood sample collection in years 7 and 10 after randomization for translational research purposes.  The pt then read the 22 page consent form.  The pt had no questions/concerns about continuing her participation in the [place] study.  The pt was told that the final analysis results are planned to be publicly presented at [facility] ([place]) 2021 in December, as well as in a full scientific publication later this year.  The pt signed the consent form, and she was given a copy of her signed consent form to take home for future reference.  The pt was seen and examined by Dr. Lindi Adie.  The pt reported the following AE's as ongoing:  hot flashes, arthralgias (knees), vaginal dryness, cognitive disturbance, and fatigue.  She states her anxiety resolved "last fall", and she stopped her Zoloft on 10/05/2018.  Dr. Lindi Adie confirmed that the pt did not have any serious adverse events related to palbociclib since her last visit.  The pt denied any positive COVID tests.  The pt was told to inform the research nurse if she has any positive COVID tests.  The pt verbalized understanding. The pt said that she was very eager to  receive her booster vaccine shot in the near future.  Dr. Lindi Adie felt the pt had no clinical concern for disease recurrence. The was told that she will need to be seen in 1 year for her month 48 in-person visit.  The research nurse told the that she would call her around February 18, 2020 to check on her status.  The pt was encouraged to call Dr. Lindi Adie or the research nurse if she has any concerns/questions in the interim.  The pt was thanked for her continued support in the [name] study. Brion Aliment RN, BSN, CCRP Clinical Research Nurse 08/23/2019 4:56 PM

## 2019-08-23 NOTE — Assessment & Plan Note (Signed)
Bilateral mastectomies 10/01/2015 With reconstruction Right mastectomy: IDC grade 2, multifocal largest 2.5 cm, with high-grade DCIS, broadly present at anterior margin and inferior margin 2/5 LN positive, LVI Present,  Left mastectomy: ALH, ER 95%, PR 5-65%, HER-2 negative, Ki-67 10-20%,  Pathologic stage: T2 N1 a stage IIB Mammaprint: High risk CT-CAP and bone scan: No evidence of metastatic disease ----------------------------------------------------------------------------------------------------------------------------------------------------------------- Treatment summary: 1. Adjuvant chemotherapy with dose dense Adriamycin and Cytoxan 4 followed by Abraxane weekly 12 started 11/05/2015 completed 03/17/2016 3.Adjuvant radiation therapy Completed 06/08/2016 4.Adjuvant antiestrogen therapy with anastrozole 7years  PREVENT trial: [id number]  [name] clinical trial: Off Ibrance because of study did not show benefit -------------------------------------------------------------------------------------------------------------------------------------------------------- Anastrozole Toxicities: No hot flashes or myalgias. Patient is tolerating anastrozole extremely well.  Severe fatigue: Most likely related to prior Ibrance.   This was complicated by recent viral syndrome. COVID test: Negative  No clinical concern for disease recurrence.  Return to clinic in 6 months for follow-up

## 2019-08-24 ENCOUNTER — Telehealth: Payer: Self-pay | Admitting: Hematology and Oncology

## 2019-08-24 ENCOUNTER — Telehealth: Payer: Self-pay | Admitting: Internal Medicine

## 2019-08-24 NOTE — Telephone Encounter (Signed)
Received a staff msg from Dr. Mickeal Skinner to schedule a new pt appt for Ms. Pohlmann. I cld and lft the pt a vm to schedule an appt w/him.

## 2019-08-24 NOTE — Telephone Encounter (Signed)
Scheduled appts per 8/19 los. Left voicemail with appt date and time.

## 2019-08-25 DIAGNOSIS — H524 Presbyopia: Secondary | ICD-10-CM | POA: Diagnosis not present

## 2019-08-28 ENCOUNTER — Telehealth: Payer: Self-pay | Admitting: Internal Medicine

## 2019-08-28 NOTE — Telephone Encounter (Signed)
Pt returned my cal to schedule an appt w/Dr. Mickeal Skinner. I lft her another vm to call back.

## 2019-08-31 ENCOUNTER — Telehealth: Payer: Self-pay | Admitting: Internal Medicine

## 2019-08-31 NOTE — Telephone Encounter (Signed)
Ms. Melanie Barber has returned my call to schedule a new pt appt w/Dr. Mickeal Skinner on 8/30 at 12pm.

## 2019-09-03 ENCOUNTER — Ambulatory Visit: Payer: 59 | Admitting: Internal Medicine

## 2019-09-03 ENCOUNTER — Other Ambulatory Visit: Payer: Self-pay | Admitting: Internal Medicine

## 2019-09-03 ENCOUNTER — Encounter: Payer: Self-pay | Admitting: Internal Medicine

## 2019-09-03 ENCOUNTER — Inpatient Hospital Stay (HOSPITAL_BASED_OUTPATIENT_CLINIC_OR_DEPARTMENT_OTHER): Payer: 59 | Admitting: Internal Medicine

## 2019-09-03 ENCOUNTER — Other Ambulatory Visit: Payer: Self-pay

## 2019-09-03 DIAGNOSIS — Z90710 Acquired absence of both cervix and uterus: Secondary | ICD-10-CM | POA: Diagnosis not present

## 2019-09-03 DIAGNOSIS — R413 Other amnesia: Secondary | ICD-10-CM | POA: Diagnosis not present

## 2019-09-03 DIAGNOSIS — Z9013 Acquired absence of bilateral breasts and nipples: Secondary | ICD-10-CM | POA: Diagnosis not present

## 2019-09-03 DIAGNOSIS — R4189 Other symptoms and signs involving cognitive functions and awareness: Secondary | ICD-10-CM

## 2019-09-03 DIAGNOSIS — Z9221 Personal history of antineoplastic chemotherapy: Secondary | ICD-10-CM | POA: Diagnosis not present

## 2019-09-03 DIAGNOSIS — Z006 Encounter for examination for normal comparison and control in clinical research program: Secondary | ICD-10-CM | POA: Diagnosis not present

## 2019-09-03 DIAGNOSIS — Z923 Personal history of irradiation: Secondary | ICD-10-CM | POA: Diagnosis not present

## 2019-09-03 DIAGNOSIS — C50411 Malignant neoplasm of upper-outer quadrant of right female breast: Secondary | ICD-10-CM | POA: Diagnosis not present

## 2019-09-03 DIAGNOSIS — Z170 Estrogen receptor positive status [ER+]: Secondary | ICD-10-CM | POA: Diagnosis not present

## 2019-09-03 DIAGNOSIS — Z79811 Long term (current) use of aromatase inhibitors: Secondary | ICD-10-CM | POA: Diagnosis not present

## 2019-09-03 MED ORDER — BUPROPION HCL ER (XL) 150 MG PO TB24: 150.0000 mg | ORAL_TABLET | Freq: Every day | ORAL | 0 refills | Status: DC

## 2019-09-03 MED ORDER — SERTRALINE HCL 25 MG PO TABS: 25.0000 mg | ORAL_TABLET | Freq: Every day | ORAL | 3 refills | Status: DC

## 2019-09-03 MED FILL — SERTRALINE HCL 25 MG TABLET: 25 | 30 days supply | Qty: 30 | Fill #0

## 2019-09-03 MED FILL — buPROPion HCL ER (XL) 150 M: 150 | 14 days supply | Qty: 14 | Fill #0

## 2019-09-04 ENCOUNTER — Telehealth: Payer: Self-pay | Admitting: Internal Medicine

## 2019-09-04 DIAGNOSIS — R4189 Other symptoms and signs involving cognitive functions and awareness: Secondary | ICD-10-CM | POA: Insufficient documentation

## 2019-09-04 NOTE — Telephone Encounter (Signed)
Scheduled per 8/30 los. Unable to reach pt. Left voicemail with appt time and date.

## 2019-09-04 NOTE — Progress Notes (Signed)
[facility] [place], [address] [address] [phone]   Cognitive Survivorship Evaluation  Date of Service: 09/04/19 Patient Name: Melanie Barber Patient MRN: [id number] Patient DOB: 1965-11-01 Provider: Ventura Sellers, MD  Identifying Statement:  Melanie Barber is a 54 y.o. female who presents for initial consultation and evaluation regarding cancer associated cognitive decline.    Referring Provider: Fanny Bien, [address] [place],  [place] [address]  Primary Cancer:  Oncologic History: Oncology History  Breast cancer of upper-outer quadrant of right female breast ([place])  09/02/2015 Mammogram   Right breast UOQ: 2 masses 3 cm apart, U/S they measured 3.4 cm, larger mass at 1.6 cm, in addition, 1 cm mass at 9:00 and 0.9 cm mass at 8:30( could be intramammary LN); right axillary LN 11 mm; microcalcs 10 cm span    09/09/2015 Initial Diagnosis   Right breast biopsy OUQ: DCIS with calcs and necrosis ER 95%, PR 80%; 9:30 position 5cmfn: IDC with DCIS ER 95%, PR 65%, Ki-67 20%,; right biopsy 8:30 position 3cmfn ER 95%, PR 5%, Ki-67 10%: intramammary LN with IDC   09/12/2015 Breast MRI   Multicentric right breast cancer cluster of enhancing masses 4.1 x 2.2 x 2.6 cm, LOQ: 1.1 cm enhancing mass previously biopsied, subcutaneous low right breast 2 cm mass not biopsied, multiple other small enhancing masses, 1 cm right axillary lymph node   10/01/2015 Surgery   Right mastectomy: IDC grade 2, multifocal largest 2.5 cm, with high-grade DCIS, broadly present at  anterior margin and inferior margin 2/5 LN positive, LVI Present,  Left mastectomy: ALH, ER 95%, PR 5-65%, HER-2 negative, Ki-67 10-20%, T2 N1 a stage IIB   11/05/2015 - 03/17/2016 Chemotherapy   Adjuvant chemotherapy with dose dense Adriamycin and Cytoxan 4 followed by Abraxane weekly 12   04/22/2016 - 06/08/2016 Radiation Therapy   Adjuvant radiation therapy     06/22/2016 Surgery   Hysterectomy with bilateral salpingo-oophorectomy: Negative for malignancy   06/30/2016 -  Anti-estrogen oral therapy   Anastrozole 1 mg daily, patient is on a clinical trial [name] ( randomized to Palbociclib)     History of Present Illness: The patient's records from the referring physician were obtained and reviewed and the patient interviewed to confirm this HPI.  Melanie Barber presents today to discuss cognitive changes since beginning treatment for breast cancer.  She describes modest impairment in attention, processing speed and short term memory.  There is increased anxiety and mood lability as well. It is more difficult for her to work because of cognitive issues, but she continues to challenge herself and is currently a Marine scientist in cardiac electrophysiology.  Her burden of chemotherapy from 2018-19 was quite high and she participated in a clinical trial here. Otherwise denies focal complaints, no seizures, headaches.  Medications: Current Outpatient Medications on File Prior to Visit  Medication Sig Dispense Refill  . anastrozole (ARIMIDEX) 1 MG tablet Take 1 tablet (1 mg total) by mouth daily. 90 tablet 3  . Calcium Carbonate-Vitamin D (CALCIUM 600+D PO) Take 1 tablet by mouth daily.    . Multiple Vitamin (MULTIVITAMIN) tablet Take 1 tablet by mouth daily.     No current facility-administered medications on file prior to visit.    Allergies:  Allergies  Allergen Reactions  . Keflex [Cephalexin] Anaphylaxis  . Decadron [Dexamethasone] Anxiety    Beyond hyper   Past Medical History:  Past Medical History:  Diagnosis Date  . Arthritis    right knee (12/20/2016)  . Cancer of right breast ([facility])   . Depression   . PONV (postoperative nausea and vomiting)   . Sciatic leg pain    left leg   Past Surgical History:  Past Surgical History:  Procedure Laterality Date  . ABDOMINAL HYSTERECTOMY    . BREAST BIOPSY Right   . BREAST CYST ASPIRATION Right    . BREAST RECONSTRUCTION WITH PLACEMENT OF TISSUE EXPANDER AND FLEX HD (ACELLULAR HYDRATED DERMIS) Bilateral 10/01/2015  . BREAST RECONSTRUCTION WITH PLACEMENT OF TISSUE EXPANDER AND FLEX HD (ACELLULAR HYDRATED DERMIS) Bilateral 10/01/2015   Procedure: BREAST RECONSTRUCTION WITH PLACEMENT OF TISSUE EXPANDER AND CORTIVA;  Surgeon: Irene Limbo, MD;  Location: [facility];  Service: Plastics;  Laterality: Bilateral;  . [place]   growth removed  . LATISSIMUS FLAP TO BREAST Right 12/20/2016  . LATISSIMUS FLAP TO BREAST Right 12/20/2016   Procedure: RIGHT LATISSIMUS FLAP TO BREAST;  Surgeon: Irene Limbo, MD;  Location: [facility];  Service: Plastics;  Laterality: Right;  . LIPOSUCTION WITH LIPOFILLING N/A 12/20/2016   Procedure: LIPOSUCTION WITH LIPOFILLING;  Surgeon: Irene Limbo, MD;  Location: [facility];  Service: Plastics;  Laterality: N/A;  . MASTECTOMY Left 10/01/2015   NIPPLE SPARING  . MASTECTOMY COMPLETE / SIMPLE W/ SENTINEL NODE BIOPSY Right 10/01/2015   NIPPLE SPARING  . NASAL/SINUS ENDOSCOPY Bilateral 2005  . NIPPLE SPARING MASTECTOMY/SENTINAL LYMPH NODE BIOPSY/RECONSTRUCTION/PLACEMENT OF TISSUE EXPANDER Bilateral 10/01/2015   Procedure: BILATERAL NIPPLE SPARING MASTECTOMY WITH RIGHT SENTINAL LYMPH NODE BIOPSY;  Surgeon: Rolm Bookbinder, MD;  Location: [facility];  Service: General;  Laterality: Bilateral;  . PORT-A-CATH REMOVAL Right 04/01/2016   Procedure: MINOR REMOVAL PORT-A-CATH;  Surgeon: Rolm Bookbinder, MD;  Location: [facility];  Service: General;  Laterality: Right;  . PORTACATH PLACEMENT Right 10/01/2015   Procedure: INSERTION PORT-A-CATH;  Surgeon: Rolm Bookbinder, MD;  Location: [facility];  Service: General;  Laterality: Right;  . REMOVAL OF BILATERAL TISSUE EXPANDERS WITH PLACEMENT OF BILATERAL BREAST IMPLANTS Bilateral 82/99/3716   SILICONE BREAST IMPLANTS, LIPOFILLING FROM ABDOMEN TO BILATERAL CHESTBilateral    . REMOVAL OF BILATERAL TISSUE  EXPANDERS WITH PLACEMENT OF BILATERAL BREAST IMPLANTS Bilateral 12/20/2016   Procedure: REMOVAL OF BILATERAL TISSUE EXPANDERS WITH PLACEMENT OF BILATERAL SILICONE BREAST IMPLANTS, LIPOFILLING FROM ABDOMEN TO BILATERAL CHEST;  Surgeon: Irene Limbo, MD;  Location: [facility];  Service: Plastics;  Laterality: Bilateral;  . ROBOTIC ASSISTED TOTAL HYSTERECTOMY WITH BILATERAL SALPINGO OOPHERECTOMY Bilateral 06/22/2016   Procedure: XI ROBOTIC ASSISTED TOTAL HYSTERECTOMY WITH BILATERAL SALPINGO OOPHORECTOMY;  Surgeon: Everitt Amber, MD;  Location: [facility];  Service: Gynecology;  Laterality: Bilateral;  . TOE SURGERY Left 1990   "bone spur on great toe"  . TONSILLECTOMY  1977  . ULTRASOUND GUIDANCE FOR VASCULAR ACCESS Right 10/01/2015   Procedure: ULTRASOUND GUIDANCE;  Surgeon: Rolm Bookbinder, MD;  Location: [facility];  Service: General;  Laterality: Right;  . WISDOM TOOTH EXTRACTION     Social History:  Social History   Socioeconomic History  . Marital status: Divorced    Spouse name: Not on file  . Number of children: Not on file  . Years of education: Not on file  . Highest education level: Not on file  Occupational History  . Not on file  Tobacco Use  . Smoking status: Never Smoker  . Smokeless tobacco: Never Used  Vaping Use  . Vaping Use: Never used  Substance and Sexual Activity  . Alcohol  use: Yes    Alcohol/week: 2.0 standard drinks    Types: 2 Glasses of wine per week  . Drug use: No  . Sexual activity: Yes    Birth control/protection: I.U.D.  Other Topics Concern  . Not on file  Social History Narrative  . Not on file   Social Determinants of Health   Financial Resource Strain:   . Difficulty of Paying Living Expenses: Not on file  Food Insecurity:   . Worried About Charity fundraiser in the Last Year: Not on file  . Ran Out of Food in the Last Year: Not on file  Transportation Needs:   . Lack of Transportation (Medical): Not on file  . Lack of Transportation  (Non-Medical): Not on file  Physical Activity:   . Days of Exercise per Week: Not on file  . Minutes of Exercise per Session: Not on file  Stress:   . Feeling of Stress : Not on file  Social Connections:   . Frequency of Communication with Friends and Family: Not on file  . Frequency of Social Gatherings with Friends and Family: Not on file  . Attends Religious Services: Not on file  . Active Member of Clubs or Organizations: Not on file  . Attends Archivist Meetings: Not on file  . Marital Status: Not on file  Intimate Partner Violence:   . Fear of Current or Ex-Partner: Not on file  . Emotionally Abused: Not on file  . Physically Abused: Not on file  . Sexually Abused: Not on file   Family History:  Family History  Problem Relation Age of Onset  . Depression Mother   . Inflammatory bowel disease Mother   . Alcohol abuse Mother   . Bipolar disorder Mother   . Hypertension Son   . Other Brother        severe intellectual disabilities  . Arthritis Maternal Uncle        hx knee replacement surgeries  . Stroke Maternal Grandmother 86  . Breast cancer Maternal Grandmother        dx before menopause  . Uterine cancer Maternal Grandmother        dx late 38s  . Bipolar disorder Maternal Grandfather   . Stroke Paternal Grandmother 13  . Diabetes Paternal Grandmother        later in life  . Heart attack Paternal Grandfather     Review of Systems: Constitutional: Doesn't report fevers, chills or abnormal weight loss Eyes: Doesn't report blurriness of vision Ears, nose, mouth, throat, and face: Doesn't report sore throat Respiratory: Doesn't report cough, dyspnea or wheezes Cardiovascular: Doesn't report palpitation, chest discomfort  Gastrointestinal:  Doesn't report nausea, constipation, diarrhea GU: Doesn't report incontinence Skin: Doesn't report skin rashes Neurological: Per HPI Musculoskeletal: Doesn't report joint pain Behavioral/Psych:  +anxiety  Physical Exam: Vitals:   09/03/19 1220  BP: 116/81  Pulse: 86  Resp: 18  Temp: 99.3 F (37.4 C)  SpO2: 99%   General: Alert, cooperative, pleasant, in no acute distress Head: Normal EENT: No conjunctival injection or scleral icterus.  Lungs: Resp effort normal Cardiac: Regular rate Abdomen: Non-distended abdomen Skin: No rashes cyanosis or petechiae. Extremities: No clubbing or edema  Neurologic Exam: Mental Status: Awake, alert, attentive to examiner. Oriented to self and environment. Language is fluent with intact comprehension.  Cranial Nerves: Visual acuity is grossly normal. Visual fields are full. Extra-ocular movements intact. No ptosis. Face is symmetric Motor: Tone and bulk are normal. Power is  full in both arms and legs.  Sensory: Intact to light touch Gait: Normal.   Labs: I have reviewed the data as listed    Component Value Date/Time   NA 140 07/04/2018 0821   NA 140 10/22/2016 0951   K 4.1 07/04/2018 0821   K 4.1 10/22/2016 0951   CL 105 07/04/2018 0821   CO2 24 07/04/2018 0821   CO2 26 10/22/2016 0951   GLUCOSE 82 07/04/2018 0821   GLUCOSE 76 10/22/2016 0951   BUN 16 07/04/2018 0821   BUN 21.4 10/22/2016 0951   CREATININE 0.85 07/04/2018 0821   CREATININE 0.7 10/22/2016 0951   CALCIUM 8.9 07/04/2018 0821   CALCIUM 9.1 10/22/2016 0951   PROT 7.0 07/04/2018 0821   PROT 6.7 10/22/2016 0951   ALBUMIN 4.0 07/04/2018 0821   ALBUMIN 4.0 10/22/2016 0951   AST 27 07/04/2018 0821   AST 21 10/22/2016 0951   ALT 37 07/04/2018 0821   ALT 20 10/22/2016 0951   ALKPHOS 80 07/04/2018 0821   ALKPHOS 68 10/22/2016 0951   BILITOT 0.7 07/04/2018 0821   BILITOT 0.64 10/22/2016 0951   GFRNONAA >60 07/04/2018 0821   GFRAA >60 07/04/2018 0821   Lab Results  Component Value Date   WBC 6.1 07/04/2018   NEUTROABS 4.3 07/04/2018   HGB 12.7 07/04/2018   HCT 36.7 07/04/2018   MCV 100.3 (H) 07/04/2018   PLT 260 07/04/2018      Assessment/Plan:  Cognitive Changes  Melanie Barber Melanie Barber presents with clinical syndrome consistent with mild 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     We provided counseling regarding healthy behaviors to maintain cognitive function, including exercise, diet, and positive outlook.  We discussed mindful relaxation and provided some methods to redirect anxiety without medication.  Some primary source information was provided.   For depression medication, she would like to come off the Wellubutrin due to poor efficacy and resume Sertraline, which was well tolerated previously.  We recommended decreasing Wellbutrin to 150mg  daily x14 days, then discontinuing and resuming sertraline 25mg  daily.  No further CNS workup or cognitive screening recommended at this time.  We spent twenty additional minutes teaching regarding the natural history, biology, and historical experience in the treatment of neurologic complications of cancer.   We appreciate the opportunity to participate in the care of Melanie Barber.  We will follow up with her in 2-3 months for further management of symptoms and medications.  All questions were answered. The patient knows to call the clinic with any problems, questions or concerns. No barriers to learning were detected.  The total time spent in the encounter was 40 minutes and more than 50% was on counseling and review of test results   Ventura Sellers, MD Medical Director of Neuro-Oncology [facility] at [place] 09/04/19 11:38 AM

## 2019-10-17 DIAGNOSIS — Z0000 Encounter for general adult medical examination without abnormal findings: Secondary | ICD-10-CM | POA: Diagnosis not present

## 2019-10-17 DIAGNOSIS — Z1159 Encounter for screening for other viral diseases: Secondary | ICD-10-CM | POA: Diagnosis not present

## 2019-10-23 ENCOUNTER — Encounter: Payer: Self-pay | Admitting: Gastroenterology

## 2019-10-23 DIAGNOSIS — Z0000 Encounter for general adult medical examination without abnormal findings: Secondary | ICD-10-CM | POA: Diagnosis not present

## 2019-10-23 DIAGNOSIS — Z118 Encounter for screening for other infectious and parasitic diseases: Secondary | ICD-10-CM | POA: Diagnosis not present

## 2019-10-23 DIAGNOSIS — Z23 Encounter for immunization: Secondary | ICD-10-CM | POA: Diagnosis not present

## 2019-10-23 DIAGNOSIS — Z1211 Encounter for screening for malignant neoplasm of colon: Secondary | ICD-10-CM | POA: Diagnosis not present

## 2019-10-23 DIAGNOSIS — Z01419 Encounter for gynecological examination (general) (routine) without abnormal findings: Secondary | ICD-10-CM | POA: Diagnosis not present

## 2019-10-23 MED FILL — ANASTROZOLE 1 MG TABLET: 1 | 90 days supply | Qty: 90 | Fill #0

## 2019-10-25 DIAGNOSIS — M1711 Unilateral primary osteoarthritis, right knee: Secondary | ICD-10-CM | POA: Diagnosis not present

## 2019-10-25 DIAGNOSIS — M25561 Pain in right knee: Secondary | ICD-10-CM | POA: Diagnosis not present

## 2019-11-12 MED FILL — SERTRALINE HCL 25 MG TABLET: 25 | 30 days supply | Qty: 30 | Fill #1

## 2019-11-19 ENCOUNTER — Inpatient Hospital Stay: Payer: 59 | Admitting: Internal Medicine

## 2019-11-27 DIAGNOSIS — M25561 Pain in right knee: Secondary | ICD-10-CM | POA: Diagnosis not present

## 2019-12-04 DIAGNOSIS — M1711 Unilateral primary osteoarthritis, right knee: Secondary | ICD-10-CM | POA: Diagnosis not present

## 2019-12-11 DIAGNOSIS — M1711 Unilateral primary osteoarthritis, right knee: Secondary | ICD-10-CM | POA: Diagnosis not present

## 2019-12-13 ENCOUNTER — Other Ambulatory Visit: Payer: Self-pay

## 2019-12-13 ENCOUNTER — Other Ambulatory Visit: Payer: Self-pay | Admitting: Gastroenterology

## 2019-12-13 ENCOUNTER — Ambulatory Visit (AMBULATORY_SURGERY_CENTER): Payer: Self-pay

## 2019-12-13 VITALS — Ht 67.0 in | Wt 170.8 lb

## 2019-12-13 DIAGNOSIS — Z1211 Encounter for screening for malignant neoplasm of colon: Secondary | ICD-10-CM

## 2019-12-13 DIAGNOSIS — Z23 Encounter for immunization: Secondary | ICD-10-CM | POA: Diagnosis not present

## 2019-12-13 MED ORDER — NA SULFATE-K SULFATE-MG SULF 17.5-3.13-1.6 GM/177ML PO SOLN: 1.0000 | Freq: Once | ORAL | 0 refills | Status: DC

## 2019-12-13 MED FILL — SUPREP BOWEL PREP KIT: 17.5-3.13-1 | 2 days supply | Qty: 354 | Fill #0

## 2019-12-13 NOTE — Progress Notes (Signed)
Denies allergies to eggs or soy products. Denies complication of anesthesia or sedation. Denies use of weight loss medication. Denies use of O2.   Emmi instructions given for colonoscopy.  Patient has completed Covid vaccinations times 3.

## 2019-12-14 ENCOUNTER — Encounter: Payer: Self-pay | Admitting: Gastroenterology

## 2019-12-17 MED FILL — SERTRALINE HCL 25 MG TABLET: 25 | 30 days supply | Qty: 30 | Fill #2

## 2019-12-24 ENCOUNTER — Encounter: Payer: Self-pay | Admitting: Gastroenterology

## 2019-12-24 ENCOUNTER — Ambulatory Visit (AMBULATORY_SURGERY_CENTER): Payer: 59 | Admitting: Gastroenterology

## 2019-12-24 ENCOUNTER — Other Ambulatory Visit: Payer: Self-pay

## 2019-12-24 VITALS — BP 149/71 | HR 57 | Temp 97.5°F | Resp 14 | Ht 67.0 in | Wt 170.0 lb

## 2019-12-24 DIAGNOSIS — Z1211 Encounter for screening for malignant neoplasm of colon: Secondary | ICD-10-CM | POA: Diagnosis not present

## 2019-12-24 DIAGNOSIS — Z853 Personal history of malignant neoplasm of breast: Secondary | ICD-10-CM | POA: Diagnosis not present

## 2019-12-24 MED ORDER — SODIUM CHLORIDE 0.9 % IV SOLN: 500.0000 mL | Freq: Once | INTRAVENOUS | Status: DC

## 2019-12-24 NOTE — Op Note (Signed)
[facility] Patient Name: Melanie Barber Procedure Date: 12/24/2019 11:52 AM MRN: [id number] Endoscopist: Gerrit Heck , MD Age: 54 Referring MD:  Date of Birth: 14-Jan-1965 Gender: Female Account #: [id number] Procedure:                Colonoscopy Indications:              Screening for colorectal malignant neoplasm, This                            is the patient's first colonoscopy Medicines:                Monitored Anesthesia Care Procedure:                Pre-Anesthesia Assessment:                           - Prior to the procedure, a History and Physical                            was performed, and patient medications and                            allergies were reviewed. The patient's tolerance of                            previous anesthesia was also reviewed. The risks                            and benefits of the procedure and the sedation                            options and risks were discussed with the patient.                            All questions were answered, and informed consent                            was obtained. Prior Anticoagulants: The patient has                            taken no previous anticoagulant or antiplatelet                            agents. ASA Grade Assessment: II - A patient with                            mild systemic disease. After reviewing the risks                            and benefits, the patient was deemed in                            satisfactory condition to undergo the procedure.  After obtaining informed consent, the colonoscope                            was passed under direct vision. Throughout the                            procedure, the patient's blood pressure, pulse, and                            oxygen saturations were monitored continuously. The                            Olympus CF-HQ190L (Serial# [id number]) Colonoscope was                            introduced through  the anus and advanced to the the                            terminal ileum. The colonoscopy was performed                            without difficulty. The patient tolerated the                            procedure well. The quality of the bowel                            preparation was excellent. The terminal ileum,                            ileocecal valve, appendiceal orifice, and rectum                            were photographed. Scope In: 11:59:37 AM Scope Out: 12:17:30 PM Scope Withdrawal Time: 0 hours 11 minutes 25 seconds  Total Procedure Duration: 0 hours 17 minutes 53 seconds  Findings:                 The perianal and digital rectal examinations were                            normal.                           The colon appeared normal throughout.                           Retroflexion in the rectum was not performed due to                            anatomy (narrowed rectal vault). The rectum was                            otherwise normal on anterograde views.  The terminal ileum appeared normal. Complications:            No immediate complications. Estimated Blood Loss:     Estimated blood loss: none. Impression:               - The entire examined colon is normal.                           - The examined portion of the ileum was normal.                           - No specimens collected. Recommendation:           - Patient has a contact number available for                            emergencies. The signs and symptoms of potential                            delayed complications were discussed with the                            patient. Return to normal activities tomorrow.                            Written discharge instructions were provided to the                            patient.                           - Resume previous diet.                           - Continue present medications.                           - Repeat colonoscopy  in 10 years for screening                            purposes.                           - Return to GI office PRN. Gerrit Heck, MD 12/24/2019 12:22:35 PM

## 2019-12-24 NOTE — Progress Notes (Signed)
VS-CW  Pt's states no medical or surgical changes since previsit or office visit.  

## 2019-12-24 NOTE — Progress Notes (Signed)
Report given to PACU, vss 

## 2019-12-24 NOTE — Patient Instructions (Signed)
Repeat Colonoscopy in 10 years.   YOU HAD AN ENDOSCOPIC PROCEDURE TODAY AT [facility]:   Refer to the procedure report that was given to you for any specific questions about what was found during the examination.  If the procedure report does not answer your questions, please call your gastroenterologist to clarify.  If you requested that your care partner not be given the details of your procedure findings, then the procedure report has been included in a sealed envelope for you to review at your convenience later.  YOU SHOULD EXPECT: Some feelings of bloating in the abdomen. Passage of more gas than usual.  Walking can help get rid of the air that was put into your GI tract during the procedure and reduce the bloating. If you had a lower endoscopy (such as a colonoscopy or flexible sigmoidoscopy) you may notice spotting of blood in your stool or on the toilet paper. If you underwent a bowel prep for your procedure, you may not have a normal bowel movement for a few days.  Please Note:  You might notice some irritation and congestion in your nose or some drainage.  This is from the oxygen used during your procedure.  There is no need for concern and it should clear up in a day or so.  SYMPTOMS TO REPORT IMMEDIATELY:   Following lower endoscopy (colonoscopy or flexible sigmoidoscopy):  Excessive amounts of blood in the stool  Significant tenderness or worsening of abdominal pains  Swelling of the abdomen that is new, acute  Fever of 100F or higher  For urgent or emergent issues, a gastroenterologist can be reached at any hour by calling [phone]. Do not use MyChart messaging for urgent concerns.    DIET:  We do recommend a small meal at first, but then you may proceed to your regular diet.  Drink plenty of fluids but you should avoid alcoholic beverages for 24 hours.  ACTIVITY:  You should plan to take it easy for the rest of today and you should NOT DRIVE or use heavy  machinery until tomorrow (because of the sedation medicines used during the test).    FOLLOW UP: Our staff will call the number listed on your records 48-72 hours following your procedure to check on you and address any questions or concerns that you may have regarding the information given to you following your procedure. If we do not reach you, we will leave a message.  We will attempt to reach you two times.  During this call, we will ask if you have developed any symptoms of COVID 19. If you develop any symptoms (ie: fever, flu-like symptoms, shortness of breath, cough etc.) before then, please call [phone].  If you test positive for Covid 19 in the 2 weeks post procedure, please call and report this information to [place].    If any biopsies were taken you will be contacted by phone or by letter within the next 1-3 weeks.  Please call us at [phone] if you have not heard about the biopsies in 3 weeks.    SIGNATURES/CONFIDENTIALITY: You and/or your care partner have signed paperwork which will be entered into your electronic medical record.  These signatures attest to the fact that that the information above on your After Visit Summary has been reviewed and is understood.  Full responsibility of the confidentiality of this discharge information lies with you and/or your care-partner.

## 2019-12-27 ENCOUNTER — Telehealth: Payer: Self-pay

## 2019-12-27 NOTE — Telephone Encounter (Signed)
Post procedure follow up call, no answer 

## 2020-01-14 MED FILL — ANASTROZOLE 1 MG TABLET: 1 | 90 days supply | Qty: 90 | Fill #1

## 2020-01-14 MED FILL — SERTRALINE HCL 25 MG TABLET: 25 | 30 days supply | Qty: 30 | Fill #3

## 2020-02-18 ENCOUNTER — Telehealth: Payer: Self-pay | Admitting: *Deleted

## 2020-02-18 ENCOUNTER — Other Ambulatory Visit: Payer: Self-pay | Admitting: Internal Medicine

## 2020-02-18 NOTE — Telephone Encounter (Signed)
02/18/20 at 11:14am- [name] month 42 follow up call.  The pt was not available.  Research nurse left a message informing the pt that her month 42 follow up call was due.  Nurse asked the pt to call the research nurse when she was available to conduct this assessment.  Brion Aliment RN, BSN, CCRP Clinical Research Nurse Lead 02/18/2020 11:17 AM

## 2020-02-19 ENCOUNTER — Other Ambulatory Visit: Payer: Self-pay | Admitting: Internal Medicine

## 2020-02-19 MED FILL — SERTRALINE HCL 25 MG TABLET: 25 | 30 days supply | Qty: 30 | Fill #0

## 2020-02-19 NOTE — Telephone Encounter (Signed)
Rx refill request

## 2020-03-04 ENCOUNTER — Telehealth: Payer: Self-pay | Admitting: Radiology

## 2020-03-04 NOTE — Telephone Encounter (Signed)
[name]: AFT-05  03/04/20    10:57AM  PHONE CALL: Left v/m for patient concerning 42 month follow-up phone call visit for the above listed study. Informed patient she could call this clinical research coordinator back or Doristine Johns, [employer]. Patient thanked for her time.   Carol Ada, RT(R)(T) Clinical Research Coordinator

## 2020-03-19 ENCOUNTER — Telehealth: Payer: Self-pay | Admitting: *Deleted

## 2020-03-19 NOTE — Telephone Encounter (Signed)
Melanie Barber 42 visit call  AFT-05 [name]- Month 42 follow-up visit The research nurse called the pt this morning, and the pt was available to complete this month 42 assessment over the phone.  The pt reports she is doing well.  She is working some extra shifts at work, and she reports some fatigue.    Anti-cancer medications- Patient states she is continuing to take anastrozole without interruption; she is on no new anti-cancer medications.  Serious adverse events- Patient denies any new, serious health problems since her last visit.    Disease monitoring- Patient denies any recent scans since her last visit.   COVID-19- Patient denies any COVID-19 infections since her last visit.  She reports that she has received the vaccinations and her booster shot.    The pt is aware that her next research visit will be for her month 48 visit scheduled for 08/28/20.  Thanked the pt for her continued participation in this research study.  Brion Aliment RN, BSN, CCRP Clinical Research Nurse Lead 03/19/2020 11:25 AM

## 2020-04-03 DIAGNOSIS — M1712 Unilateral primary osteoarthritis, left knee: Secondary | ICD-10-CM | POA: Diagnosis not present

## 2020-04-03 DIAGNOSIS — M1711 Unilateral primary osteoarthritis, right knee: Secondary | ICD-10-CM | POA: Diagnosis not present

## 2020-04-24 ENCOUNTER — Other Ambulatory Visit (HOSPITAL_COMMUNITY): Payer: Self-pay

## 2020-04-24 MED FILL — Anastrozole Tab 1 MG: ORAL | 90 days supply | Qty: 90 | Fill #0 | Status: AC

## 2020-04-24 MED FILL — Sertraline HCl Tab 25 MG: ORAL | 30 days supply | Qty: 30 | Fill #0 | Status: AC

## 2020-05-27 ENCOUNTER — Other Ambulatory Visit (HOSPITAL_COMMUNITY): Payer: Self-pay

## 2020-05-27 MED FILL — Sertraline HCl Tab 25 MG: ORAL | 30 days supply | Qty: 30 | Fill #1 | Status: AC

## 2020-06-26 ENCOUNTER — Other Ambulatory Visit: Payer: Self-pay | Admitting: Internal Medicine

## 2020-06-27 ENCOUNTER — Other Ambulatory Visit (HOSPITAL_COMMUNITY): Payer: Self-pay

## 2020-06-27 MED ORDER — SERTRALINE HCL 25 MG PO TABS
25.0000 mg | ORAL_TABLET | Freq: Every day | ORAL | 3 refills | Status: DC
  Filled 2020-06-27: qty 30, 30d supply, fill #0
  Filled 2020-07-28: qty 30, 30d supply, fill #1
  Filled 2020-09-01: qty 30, 30d supply, fill #2
  Filled 2020-10-02 – 2020-10-03 (×2): qty 30, 30d supply, fill #3

## 2020-07-28 MED FILL — Anastrozole Tab 1 MG: ORAL | 90 days supply | Qty: 90 | Fill #1 | Status: AC

## 2020-07-29 ENCOUNTER — Other Ambulatory Visit (HOSPITAL_COMMUNITY): Payer: Self-pay

## 2020-08-27 NOTE — Progress Notes (Signed)
Patient Care Team: Lewis Moccasin, MD as PCP - General (Family Medicine) Emelia Loron, MD as Consulting Physician (General Surgery) Serena Croissant, MD as Consulting Physician (Hematology and Oncology) Axel Filler, Larna Daughters, NP as Nurse Practitioner (Hematology and Oncology) Dorothy Puffer, MD as Consulting Physician (Radiation Oncology) Bensimhon, Bevelyn Buckles, MD as Consulting Physician (Cardiology)  DIAGNOSIS:    ICD-10-CM   1. Malignant neoplasm of upper-outer quadrant of right breast in female, estrogen receptor positive (HCC)  C50.411    Z17.0       SUMMARY OF ONCOLOGIC HISTORY: Oncology History  Breast cancer of upper-outer quadrant of right female breast (HCC)  09/02/2015 Mammogram   Right breast UOQ: 2 masses 3 cm apart, U/S they measured 3.4 cm, larger mass at 1.6 cm, in addition, 1 cm mass at 9:00 and 0.9 cm mass at 8:30( could be intramammary LN); right axillary LN 11 mm; microcalcs 10 cm span    09/09/2015 Initial Diagnosis   Right breast biopsy OUQ: DCIS with calcs and necrosis ER 95%, PR 80%; 9:30 position 5cmfn: IDC with DCIS ER 95%, PR 65%, Ki-67 20%,; right biopsy 8:30 position 3cmfn ER 95%, PR 5%, Ki-67 10%: intramammary LN with IDC   09/12/2015 Breast MRI   Multicentric right breast cancer cluster of enhancing masses 4.1 x 2.2 x 2.6 cm, LOQ: 1.1 cm enhancing mass previously biopsied, subcutaneous low right breast 2 cm mass not biopsied, multiple other small enhancing masses, 1 cm right axillary lymph node   10/01/2015 Surgery   Right mastectomy: IDC grade 2, multifocal largest 2.5 cm, with high-grade DCIS, broadly present at  anterior margin and inferior margin 2/5 LN positive, LVI Present,  Left mastectomy: ALH, ER 95%, PR 5-65%, HER-2 negative, Ki-67 10-20%, T2 N1 a stage IIB   11/05/2015 - 03/17/2016 Chemotherapy   Adjuvant chemotherapy with dose dense Adriamycin and Cytoxan 4 followed by Abraxane weekly 12   04/22/2016 - 06/08/2016 Radiation Therapy   Adjuvant  radiation therapy   06/22/2016 Surgery   Hysterectomy with bilateral salpingo-oophorectomy: Negative for malignancy   06/30/2016 -  Anti-estrogen oral therapy   Anastrozole 1 mg daily, patient is on a clinical trial [name] ( randomized to Palbociclib)     CHIEF COMPLIANT: Follow-up of right breast cancer on anastrozole therapy   INTERVAL HISTORY: Melanie Barber is a 55 y.o. with above-mentioned history of right breast cancer currently on anastrozole. She presents to the clinic today for follow-up.  She denies any pain or discomfort in the breast but denies any pain anywhere in the body.  Denies any lumps or nodules.  She continues to have hot flashes from anastrozole.  ALLERGIES:  is allergic to keflex [cephalexin] and decadron [dexamethasone].  MEDICATIONS:  Current Outpatient Medications  Medication Sig Dispense Refill   anastrozole (ARIMIDEX) 1 MG tablet TAKE 1 TABLET (1 MG TOTAL) BY MOUTH DAILY. 90 tablet 3   Calcium Carbonate-Vitamin D (CALCIUM 600+D PO) Take 1 tablet by mouth daily.     Multiple Vitamin (MULTIVITAMIN) tablet Take 1 tablet by mouth daily.     sertraline (ZOLOFT) 25 MG tablet Take 1 tablet (25 mg total) by mouth daily. 30 tablet 3   No current facility-administered medications for this visit.    PHYSICAL EXAMINATION: ECOG PERFORMANCE STATUS: 1 - Symptomatic but completely ambulatory  Vitals:   08/28/20 1529  BP: 111/75  Pulse: 71  Resp: 18  Temp: 97.8 F (36.6 C)  SpO2: 97%   Filed Weights   08/28/20 1529  Weight: 173  lb (78.5 kg)    BREAST: Bilateral breast reconstruction.  No palpable lumps or nodules.  (exam performed in the presence of a chaperone)  LABORATORY DATA:  I have reviewed the data as listed CMP Latest Ref Rng & Units 07/04/2018 03/22/2018 12/27/2017  Glucose 70 - 99 mg/dL 82 78 76  BUN 6 - 20 mg/dL $Remove'16 11 14  'siERwbg$ Creatinine 0.44 - 1.00 mg/dL 0.85 0.77 0.78  Sodium 135 - 145 mmol/L 140 140 142  Potassium 3.5 - 5.1 mmol/L 4.1 4.1 4.0   Chloride 98 - 111 mmol/L 105 105 105  CO2 22 - 32 mmol/L $RemoveB'24 26 28  'CjVAvlgy$ Calcium 8.9 - 10.3 mg/dL 8.9 8.8(L) 9.0  Total Protein 6.5 - 8.1 g/dL 7.0 6.9 6.9  Total Bilirubin 0.3 - 1.2 mg/dL 0.7 0.7 0.5  Alkaline Phos 38 - 126 U/L 80 70 65  AST 15 - 41 U/L $Remo'27 19 18  'UWgSj$ ALT 0 - 44 U/L 37 19 15    Lab Results  Component Value Date   WBC 6.1 07/04/2018   HGB 12.7 07/04/2018   HCT 36.7 07/04/2018   MCV 100.3 (H) 07/04/2018   PLT 260 07/04/2018   NEUTROABS 4.3 07/04/2018    ASSESSMENT & PLAN:  Breast cancer of upper-outer quadrant of right female breast ([place]) Bilateral mastectomies 10/01/2015 With reconstruction Right mastectomy: IDC grade 2, multifocal largest 2.5 cm, with high-grade DCIS, broadly present at  anterior margin and inferior margin 2/5 LN positive, LVI Present,   Left mastectomy: ALH, ER 95%, PR 5-65%, HER-2 negative, Ki-67 10-20%,  Pathologic stage: T2 N1 a stage IIB Mammaprint: High risk CT-CAP and bone scan: No evidence of metastatic disease ----------------------------------------------------------------------------------------------------------------------------------------------------------------- Treatment summary: 1. Adjuvant chemotherapy with dose dense Adriamycin and Cytoxan 4 followed by Abraxane weekly 12 started 11/05/2015 completed 03/17/2016 3. Adjuvant radiation therapy Completed 06/08/2016 4. Adjuvant antiestrogen therapy with anastrozole  7 years started 06/30/2016   PREVENT trial: [id number]  [name] clinical trial: Off Ibrance because of study did not show benefit -------------------------------------------------------------------------------------------------------------------------------------------------------- Anastrozole Toxicities: No hot flashes or myalgias. Patient is tolerating anastrozole extremely well.   Fatigue: Related to her high intensity of work Memory issues: Continues to be a problem. Vaginal dryness: Related to anastrozole We will  order a bone density test to be done at breast center.     No clinical concern for disease recurrence.   Return to clinic in 1 year for follow-up    No orders of the defined types were placed in this encounter.  The patient has a good understanding of the overall plan. she agrees with it. she will call with any problems that may develop before the next visit here.  Total time spent: 20 mins including face to face time and time spent for planning, charting and coordination of care  Rulon Eisenmenger, MD, MPH 08/28/2020  I, Thana Ates, am acting as scribe for Dr. Nicholas Lose.  I have reviewed the above documentation for accuracy and completeness, and I agree with the above.

## 2020-08-28 ENCOUNTER — Other Ambulatory Visit: Payer: Self-pay

## 2020-08-28 ENCOUNTER — Inpatient Hospital Stay: Payer: 59 | Attending: Hematology and Oncology | Admitting: Hematology and Oncology

## 2020-08-28 ENCOUNTER — Encounter: Payer: Self-pay | Admitting: *Deleted

## 2020-08-28 VITALS — BP 111/75 | HR 71 | Temp 97.8°F | Resp 18 | Ht 67.0 in | Wt 173.0 lb

## 2020-08-28 DIAGNOSIS — Z170 Estrogen receptor positive status [ER+]: Secondary | ICD-10-CM

## 2020-08-28 DIAGNOSIS — C50411 Malignant neoplasm of upper-outer quadrant of right female breast: Secondary | ICD-10-CM

## 2020-08-28 DIAGNOSIS — Z780 Asymptomatic menopausal state: Secondary | ICD-10-CM

## 2020-08-28 DIAGNOSIS — Z006 Encounter for examination for normal comparison and control in clinical research program: Secondary | ICD-10-CM | POA: Diagnosis not present

## 2020-08-28 NOTE — Research (Signed)
[place] 48 visit    AFT-05 [name]- Month 48 follow-up visit Patient came into clinic today by herself to complete the month 48 visit on study.    Anti-cancer medications- Patient states she is continuing to take anastrozole without interruption; she is on no new anti-cancer medications.   Serious adverse events- Patient denies any new, serious health problems since her last visit.     Disease monitoring- Dr. Lindi Adie completed clinical exam during today's visit. See MD notes from today's encounter.   COVID-19- Patient denies any COVID-19 infections since her last visit.    The pt is aware that her next research visit in clinic will be in one year and research will contact her in 6 months for a "phone visit"  Thanked the pt for her continued participation in this research study.   Foye Spurling, BSN, RN Clinical Research Nurse 08/28/2020 4:29 PM

## 2020-08-28 NOTE — Assessment & Plan Note (Signed)
Bilateral mastectomies 10/01/2015 With reconstruction Right mastectomy: IDC grade 2, multifocal largest 2.5 cm, with high-grade DCIS, broadly present at anterior margin and inferior margin 2/5 LN positive, LVI Present,  Left mastectomy: ALH, ER 95%, PR 5-65%, HER-2 negative, Ki-67 10-20%,  Pathologic stage: T2 N1 a stage IIB Mammaprint: High risk CT-CAP and bone scan: No evidence of metastatic disease ----------------------------------------------------------------------------------------------------------------------------------------------------------------- Treatment summary: 1. Adjuvant chemotherapy with dose dense Adriamycin and Cytoxan 4 followed by Abraxane weekly 12 started 11/05/2015 completed 03/17/2016 3.Adjuvant radiation therapy Completed 06/08/2016 4.Adjuvant antiestrogen therapy with anastrozole 7years started 06/30/2016  PREVENT trial: [id number] [name] clinical trial: Off Ibrance because of study did not show benefit -------------------------------------------------------------------------------------------------------------------------------------------------------- Anastrozole Toxicities: No hot flashes or myalgias. Patient is tolerating anastrozole extremely well.  Fatigue: Related to her high intensity of work Memory issues: I sent a message to Dr. Mickeal Skinner to see if he would consult on her Vaginal dryness: Related to anastrozole   No clinical concern for disease recurrence. I renewed her prescription for anastrozole. Return to clinic in 1 year for follow-up

## 2020-09-01 ENCOUNTER — Other Ambulatory Visit (HOSPITAL_COMMUNITY): Payer: Self-pay

## 2020-09-09 ENCOUNTER — Other Ambulatory Visit: Payer: Self-pay

## 2020-09-09 ENCOUNTER — Ambulatory Visit (HOSPITAL_BASED_OUTPATIENT_CLINIC_OR_DEPARTMENT_OTHER)
Admission: RE | Admit: 2020-09-09 | Discharge: 2020-09-09 | Disposition: A | Discharge status: Home / Self Care | Payer: 59 | Source: Ambulatory Visit | Attending: Hematology and Oncology | Admitting: Hematology and Oncology

## 2020-09-09 DIAGNOSIS — Z90722 Acquired absence of ovaries, bilateral: Secondary | ICD-10-CM | POA: Diagnosis not present

## 2020-09-09 DIAGNOSIS — Z780 Asymptomatic menopausal state: Secondary | ICD-10-CM | POA: Insufficient documentation

## 2020-09-09 DIAGNOSIS — Z90711 Acquired absence of uterus with remaining cervical stump: Secondary | ICD-10-CM | POA: Insufficient documentation

## 2020-09-09 DIAGNOSIS — M8580 Other specified disorders of bone density and structure, unspecified site: Secondary | ICD-10-CM | POA: Diagnosis not present

## 2020-09-09 DIAGNOSIS — M8589 Other specified disorders of bone density and structure, multiple sites: Secondary | ICD-10-CM | POA: Diagnosis not present

## 2020-10-02 ENCOUNTER — Other Ambulatory Visit (HOSPITAL_COMMUNITY): Payer: Self-pay

## 2020-10-03 ENCOUNTER — Other Ambulatory Visit (HOSPITAL_COMMUNITY): Payer: Self-pay

## 2020-10-22 DIAGNOSIS — Z0000 Encounter for general adult medical examination without abnormal findings: Secondary | ICD-10-CM | POA: Diagnosis not present

## 2020-10-28 DIAGNOSIS — H6121 Impacted cerumen, right ear: Secondary | ICD-10-CM | POA: Diagnosis not present

## 2020-10-28 DIAGNOSIS — Z0000 Encounter for general adult medical examination without abnormal findings: Secondary | ICD-10-CM | POA: Diagnosis not present

## 2020-11-04 ENCOUNTER — Other Ambulatory Visit: Payer: Self-pay | Admitting: Hematology and Oncology

## 2020-11-04 ENCOUNTER — Other Ambulatory Visit: Payer: Self-pay | Admitting: Internal Medicine

## 2020-11-05 ENCOUNTER — Other Ambulatory Visit (HOSPITAL_COMMUNITY): Payer: Self-pay

## 2020-11-05 MED ORDER — ANASTROZOLE 1 MG PO TABS
1.0000 mg | ORAL_TABLET | Freq: Every day | ORAL | 3 refills | Status: DC
  Filled 2020-11-05: qty 90, 90d supply, fill #0
  Filled 2021-02-05 – 2021-02-06 (×2): qty 90, 90d supply, fill #1
  Filled 2021-02-11: qty 90, 90d supply, fill #2
  Filled 2021-08-24: qty 90, 90d supply, fill #3

## 2020-11-05 MED ORDER — SERTRALINE HCL 25 MG PO TABS
25.0000 mg | ORAL_TABLET | Freq: Every day | ORAL | 3 refills | Status: DC
  Filled 2020-11-05: qty 30, 30d supply, fill #0

## 2020-11-11 ENCOUNTER — Other Ambulatory Visit (HOSPITAL_COMMUNITY): Payer: Self-pay

## 2020-11-11 DIAGNOSIS — F331 Major depressive disorder, recurrent, moderate: Secondary | ICD-10-CM | POA: Diagnosis not present

## 2020-11-11 DIAGNOSIS — F411 Generalized anxiety disorder: Secondary | ICD-10-CM | POA: Diagnosis not present

## 2020-11-11 DIAGNOSIS — F988 Other specified behavioral and emotional disorders with onset usually occurring in childhood and adolescence: Secondary | ICD-10-CM | POA: Diagnosis not present

## 2020-11-11 DIAGNOSIS — G4700 Insomnia, unspecified: Secondary | ICD-10-CM | POA: Diagnosis not present

## 2020-11-11 MED ORDER — AMPHETAMINE-DEXTROAMPHETAMINE 10 MG PO TABS
10.0000 mg | ORAL_TABLET | Freq: Two times a day (BID) | ORAL | 0 refills | Status: DC
  Filled 2020-11-11: qty 120, 30d supply, fill #0

## 2020-11-11 MED ORDER — AMPHETAMINE-DEXTROAMPHETAMINE 10 MG PO TABS
10.0000 mg | ORAL_TABLET | Freq: Two times a day (BID) | ORAL | 0 refills | Status: DC
  Filled 2020-11-11: qty 60, 30d supply, fill #0

## 2020-11-11 MED ORDER — SERTRALINE HCL 50 MG PO TABS
50.0000 mg | ORAL_TABLET | ORAL | 3 refills | Status: DC
  Filled 2020-11-11: qty 90, 90d supply, fill #0
  Filled 2021-02-05 – 2021-02-06 (×2): qty 90, 90d supply, fill #1
  Filled 2021-06-02: qty 90, 90d supply, fill #2
  Filled 2021-08-24: qty 90, 90d supply, fill #3
  Filled 2021-11-27: qty 15, 15d supply, fill #4

## 2020-12-17 ENCOUNTER — Other Ambulatory Visit (HOSPITAL_COMMUNITY): Payer: Self-pay

## 2020-12-17 DIAGNOSIS — F988 Other specified behavioral and emotional disorders with onset usually occurring in childhood and adolescence: Secondary | ICD-10-CM | POA: Diagnosis not present

## 2020-12-17 DIAGNOSIS — G4700 Insomnia, unspecified: Secondary | ICD-10-CM | POA: Diagnosis not present

## 2020-12-17 DIAGNOSIS — F411 Generalized anxiety disorder: Secondary | ICD-10-CM | POA: Diagnosis not present

## 2020-12-17 DIAGNOSIS — F331 Major depressive disorder, recurrent, moderate: Secondary | ICD-10-CM | POA: Diagnosis not present

## 2020-12-17 MED ORDER — ADZENYS XR-ODT 12.5 MG PO TBED
12.5000 mg | EXTENDED_RELEASE_TABLET | Freq: Every morning | ORAL | 0 refills | Status: AC
  Filled 2020-12-17 – 2020-12-18 (×2): qty 30, 30d supply, fill #0

## 2020-12-18 ENCOUNTER — Other Ambulatory Visit (HOSPITAL_COMMUNITY): Payer: Self-pay

## 2020-12-19 ENCOUNTER — Other Ambulatory Visit (HOSPITAL_COMMUNITY): Payer: Self-pay

## 2021-01-21 DIAGNOSIS — J069 Acute upper respiratory infection, unspecified: Secondary | ICD-10-CM | POA: Diagnosis not present

## 2021-01-21 DIAGNOSIS — U071 COVID-19: Secondary | ICD-10-CM | POA: Diagnosis not present

## 2021-01-21 DIAGNOSIS — Z20822 Contact with and (suspected) exposure to covid-19: Secondary | ICD-10-CM | POA: Diagnosis not present

## 2021-01-23 ENCOUNTER — Other Ambulatory Visit (HOSPITAL_COMMUNITY): Payer: Self-pay

## 2021-01-23 MED ORDER — CARESTART COVID-19 HOME TEST VI KIT
PACK | 0 refills | Status: DC
  Filled 2021-01-23: qty 4, 4d supply, fill #0

## 2021-01-27 ENCOUNTER — Other Ambulatory Visit (HOSPITAL_COMMUNITY): Payer: Self-pay

## 2021-02-05 ENCOUNTER — Other Ambulatory Visit (HOSPITAL_COMMUNITY): Payer: Self-pay

## 2021-02-06 ENCOUNTER — Other Ambulatory Visit (HOSPITAL_COMMUNITY): Payer: Self-pay

## 2021-02-11 ENCOUNTER — Other Ambulatory Visit (HOSPITAL_COMMUNITY): Payer: Self-pay

## 2021-02-11 DIAGNOSIS — F439 Reaction to severe stress, unspecified: Secondary | ICD-10-CM | POA: Diagnosis not present

## 2021-02-11 DIAGNOSIS — H524 Presbyopia: Secondary | ICD-10-CM | POA: Diagnosis not present

## 2021-02-11 DIAGNOSIS — F331 Major depressive disorder, recurrent, moderate: Secondary | ICD-10-CM | POA: Diagnosis not present

## 2021-02-11 DIAGNOSIS — F988 Other specified behavioral and emotional disorders with onset usually occurring in childhood and adolescence: Secondary | ICD-10-CM | POA: Diagnosis not present

## 2021-02-11 DIAGNOSIS — F411 Generalized anxiety disorder: Secondary | ICD-10-CM | POA: Diagnosis not present

## 2021-02-11 DIAGNOSIS — G4700 Insomnia, unspecified: Secondary | ICD-10-CM | POA: Diagnosis not present

## 2021-02-12 ENCOUNTER — Other Ambulatory Visit: Payer: 59

## 2021-02-16 ENCOUNTER — Other Ambulatory Visit: Payer: Self-pay | Admitting: *Deleted

## 2021-02-16 ENCOUNTER — Encounter: Payer: Self-pay | Admitting: *Deleted

## 2021-02-16 DIAGNOSIS — Z170 Estrogen receptor positive status [ER+]: Secondary | ICD-10-CM

## 2021-02-16 NOTE — Research (Signed)
[name]: PALBOCICLIB COLLABORATIVE ADJUVANT STUDY: A RANDOMIZED PHASE III TRIAL OF PALBOCICLIB WITH STANDARD ADJUVANT ENDOCRINE THERAPY VERSUS STANDARD ADJUVANT ENDOCRINE THERAPY ALONE FOR HORMONE RECEPTOR POSITIVE (HR+)/HUMAN EPIDERMAL GROWTH FACTOR RECEPTOR 2 (HER2)-NEGATIVE EARLY BREAST CANCER   Phase I - Month 54 visit call   AFT-05 [name]- Month 54 follow-up visit call The research nurse called the pt this morning, and the pt was available to complete the month 54 assessment over the phone.  The pt reports she is doing well.    Anti-cancer medications- Patient states she is continuing to take anastrozole without interruption; she is on no new anti-cancer medications.   Serious adverse events- Patient denies any new, serious health problems since her last visit.     Disease monitoring- Patient denies any recent scans since her last visit.    COVID-19- Patient reports having COVID-19 the week of January 16th, 2023.  She states she missed a week of work while she recovered from the virus.  She reports that she has received her 2 vaccination shots, and she has received "4 or 5 booster shots".       The pt is aware that her next research visit will be for her month 60 visit scheduled for 08/31/21.  The pt was informed that the nurse added a research lab appt before she sees Dr. Lindi Adie to draw her month 19 research samples.  The pt verbalized understanding.  Thanked the pt for her continued participation in this research study.  Brion Aliment RN, BSN, CCRP Clinical Research Nurse Lead 02/16/2021 10:14 AM

## 2021-05-05 DIAGNOSIS — G4700 Insomnia, unspecified: Secondary | ICD-10-CM | POA: Diagnosis not present

## 2021-05-05 DIAGNOSIS — F411 Generalized anxiety disorder: Secondary | ICD-10-CM | POA: Diagnosis not present

## 2021-05-05 DIAGNOSIS — L309 Dermatitis, unspecified: Secondary | ICD-10-CM | POA: Diagnosis not present

## 2021-05-05 DIAGNOSIS — F331 Major depressive disorder, recurrent, moderate: Secondary | ICD-10-CM | POA: Diagnosis not present

## 2021-05-05 DIAGNOSIS — F988 Other specified behavioral and emotional disorders with onset usually occurring in childhood and adolescence: Secondary | ICD-10-CM | POA: Diagnosis not present

## 2021-06-03 ENCOUNTER — Other Ambulatory Visit (HOSPITAL_COMMUNITY): Payer: Self-pay

## 2021-07-28 DIAGNOSIS — Z8659 Personal history of other mental and behavioral disorders: Secondary | ICD-10-CM | POA: Diagnosis not present

## 2021-07-28 DIAGNOSIS — F988 Other specified behavioral and emotional disorders with onset usually occurring in childhood and adolescence: Secondary | ICD-10-CM | POA: Diagnosis not present

## 2021-08-05 DIAGNOSIS — C50411 Malignant neoplasm of upper-outer quadrant of right female breast: Secondary | ICD-10-CM | POA: Diagnosis not present

## 2021-08-05 DIAGNOSIS — Z170 Estrogen receptor positive status [ER+]: Secondary | ICD-10-CM | POA: Diagnosis not present

## 2021-08-24 ENCOUNTER — Other Ambulatory Visit (HOSPITAL_COMMUNITY): Payer: Self-pay

## 2021-08-25 ENCOUNTER — Other Ambulatory Visit (HOSPITAL_COMMUNITY): Payer: Self-pay

## 2021-08-31 ENCOUNTER — Other Ambulatory Visit: Payer: 59

## 2021-08-31 ENCOUNTER — Ambulatory Visit: Payer: 59 | Admitting: Hematology and Oncology

## 2021-09-04 ENCOUNTER — Telehealth: Payer: Self-pay | Admitting: *Deleted

## 2021-09-04 NOTE — Telephone Encounter (Signed)
[place] AFT-05 [place] visit    Research nurse was notified on 08/28/21 that this pt canceled her required research appts scheduled for 08/31/21.  Research nurse has left several voice messages trying to reach the pt to get her appts rescheduled within the visit window.  Will await pt's return call. Brion Aliment RN, BSN, CCRP Clinical Research Nurse Lead 09/04/2021 10:05 AM

## 2021-09-10 NOTE — Progress Notes (Signed)
Patient Care Team: Fanny Bien, MD as PCP - General (Family Medicine) Rolm Bookbinder, MD as Consulting Physician (General Surgery) Nicholas Lose, MD as Consulting Physician (Hematology and Oncology) Delice Bison, Charlestine Massed, NP as Nurse Practitioner (Hematology and Oncology) Kyung Rudd, MD as Consulting Physician (Radiation Oncology) Bensimhon, Shaune Pascal, MD as Consulting Physician (Cardiology)  DIAGNOSIS: No diagnosis found.  SUMMARY OF ONCOLOGIC HISTORY: Oncology History  Breast cancer of upper-outer quadrant of right female breast ([place])  09/02/2015 Mammogram   Right breast UOQ: 2 masses 3 cm apart, U/S they measured 3.4 cm, larger mass at 1.6 cm, in addition, 1 cm mass at 9:00 and 0.9 cm mass at 8:30( could be intramammary LN); right axillary LN 11 mm; microcalcs 10 cm span    09/09/2015 Initial Diagnosis   Right breast biopsy OUQ: DCIS with calcs and necrosis ER 95%, PR 80%; 9:30 position 5cmfn: IDC with DCIS ER 95%, PR 65%, Ki-67 20%,; right biopsy 8:30 position 3cmfn ER 95%, PR 5%, Ki-67 10%: intramammary LN with IDC   09/12/2015 Breast MRI   Multicentric right breast cancer cluster of enhancing masses 4.1 x 2.2 x 2.6 cm, LOQ: 1.1 cm enhancing mass previously biopsied, subcutaneous low right breast 2 cm mass not biopsied, multiple other small enhancing masses, 1 cm right axillary lymph node   10/01/2015 Surgery   Right mastectomy: IDC grade 2, multifocal largest 2.5 cm, with high-grade DCIS, broadly present at  anterior margin and inferior margin 2/5 LN positive, LVI Present,  Left mastectomy: ALH, ER 95%, PR 5-65%, HER-2 negative, Ki-67 10-20%, T2 N1 a stage IIB   11/05/2015 - 03/17/2016 Chemotherapy   Adjuvant chemotherapy with dose dense Adriamycin and Cytoxan 4 followed by Abraxane weekly 12   04/22/2016 - 06/08/2016 Radiation Therapy   Adjuvant radiation therapy   06/22/2016 Surgery   Hysterectomy with bilateral salpingo-oophorectomy: Negative for malignancy    06/30/2016 -  Anti-estrogen oral therapy   Anastrozole 1 mg daily, patient is on a clinical trial [name] ( randomized to Palbociclib)     CHIEF COMPLIANT:  Follow-up of right breast cancer on anastrozole therapy   INTERVAL HISTORY: Melanie Barber is a 56 y.o. with above-mentioned history of right breast cancer currently on anastrozole. She presents to the clinic today for follow-up.   ALLERGIES:  is allergic to keflex [cephalexin] and decadron [dexamethasone].  MEDICATIONS:  Current Outpatient Medications  Medication Sig Dispense Refill   Amphetamine ER (ADZENYS XR-ODT) 12.5 MG TBED Take 1 tablet (12.5 mg) by mouth daily in the morning. 30 tablet 0   amphetamine-dextroamphetamine (ADDERALL) 10 MG tablet Take 1 tablet (10 mg total) by mouth 2 (two) times daily. 60 tablet 0   anastrozole (ARIMIDEX) 1 MG tablet Take 1 tablet (1 mg total) by mouth daily. 90 tablet 3   Calcium Carbonate-Vitamin D (CALCIUM 600+D PO) Take 1 tablet by mouth daily.     COVID-19 At Home Antigen Test [facility] COVID-19 HOME TEST) KIT Use as directed 4 each 0   Multiple Vitamin (MULTIVITAMIN) tablet Take 1 tablet by mouth daily.     sertraline (ZOLOFT) 50 MG tablet Take 1 tablet (50 mg total) by mouth every morning. 90 tablet 3   No current facility-administered medications for this visit.    PHYSICAL EXAMINATION: ECOG PERFORMANCE STATUS: {CHL ONC ECOG PS:[phone]}  There were no vitals filed for this visit. There were no vitals filed for this visit.  BREAST:*** No palpable masses or nodules in either right or left breasts. No palpable axillary  supraclavicular or infraclavicular adenopathy no breast tenderness or nipple discharge. (exam performed in the presence of a chaperone)  LABORATORY DATA:  I have reviewed the data as listed    Latest Ref Rng & Units 07/04/2018    8:21 AM 03/22/2018   11:25 AM 12/27/2017   10:09 AM  CMP  Glucose 70 - 99 mg/dL 82  78  76   BUN 6 - 20 mg/dL $Remove'16  11  14    'almAQTi$ Creatinine 0.44 - 1.00 mg/dL 0.85  0.77  0.78   Sodium 135 - 145 mmol/L 140  140  142   Potassium 3.5 - 5.1 mmol/L 4.1  4.1  4.0   Chloride 98 - 111 mmol/L 105  105  105   CO2 22 - 32 mmol/L $RemoveB'24  26  28   'FprOjzUG$ Calcium 8.9 - 10.3 mg/dL 8.9  8.8  9.0   Total Protein 6.5 - 8.1 g/dL 7.0  6.9  6.9   Total Bilirubin 0.3 - 1.2 mg/dL 0.7  0.7  0.5   Alkaline Phos 38 - 126 U/L 80  70  65   AST 15 - 41 U/L $Remo'27  19  18   'aMhxj$ ALT 0 - 44 U/L 37  19  15     Lab Results  Component Value Date   WBC 6.1 07/04/2018   HGB 12.7 07/04/2018   HCT 36.7 07/04/2018   MCV 100.3 (H) 07/04/2018   PLT 260 07/04/2018   NEUTROABS 4.3 07/04/2018    ASSESSMENT & PLAN:  No problem-specific Assessment & Plan notes found for this encounter.    No orders of the defined types were placed in this encounter.  The patient has a good understanding of the overall plan. she agrees with it. she will call with any problems that may develop before the next visit here. Total time spent: 30 mins including face to face time and time spent for planning, charting and co-ordination of care   Suzzette Righter, [place] 09/10/21    I Gardiner Coins am scribing for Dr. Lindi Adie  ***

## 2021-09-14 ENCOUNTER — Inpatient Hospital Stay: Payer: 59

## 2021-09-14 ENCOUNTER — Encounter: Payer: Self-pay | Admitting: *Deleted

## 2021-09-14 ENCOUNTER — Inpatient Hospital Stay: Payer: 59 | Attending: Hematology and Oncology | Admitting: Hematology and Oncology

## 2021-09-14 DIAGNOSIS — Z79811 Long term (current) use of aromatase inhibitors: Secondary | ICD-10-CM | POA: Insufficient documentation

## 2021-09-14 DIAGNOSIS — Z170 Estrogen receptor positive status [ER+]: Secondary | ICD-10-CM

## 2021-09-14 DIAGNOSIS — Z006 Encounter for examination for normal comparison and control in clinical research program: Secondary | ICD-10-CM | POA: Diagnosis not present

## 2021-09-14 DIAGNOSIS — C50411 Malignant neoplasm of upper-outer quadrant of right female breast: Secondary | ICD-10-CM | POA: Diagnosis not present

## 2021-09-14 DIAGNOSIS — Z79899 Other long term (current) drug therapy: Secondary | ICD-10-CM | POA: Diagnosis not present

## 2021-09-14 DIAGNOSIS — Z881 Allergy status to other antibiotic agents status: Secondary | ICD-10-CM | POA: Diagnosis not present

## 2021-09-14 DIAGNOSIS — R5383 Other fatigue: Secondary | ICD-10-CM | POA: Diagnosis not present

## 2021-09-14 LAB — RESEARCH LABS

## 2021-09-14 NOTE — Assessment & Plan Note (Signed)
Bilateral mastectomies 10/01/2015 With reconstruction Right mastectomy: IDC grade 2, multifocal largest 2.5 cm, with high-grade DCIS, broadly present at anterior margin and inferior margin 2/5 LN positive, LVI Present,  Left mastectomy: ALH, ER 95%, PR 5-65%, HER-2 negative, Ki-67 10-20%,  Pathologic stage: T2 N1 a stage IIB Mammaprint: High risk CT-CAP and bone scan: No evidence of metastatic disease ----------------------------------------------------------------------------------------------------------------------------------------------------------------- Treatment summary: 1. Adjuvant chemotherapy with dose dense Adriamycin and Cytoxan 4 followed by Abraxane weekly 12 started 11/05/2015 completed 03/17/2016 3.Adjuvant radiation therapy Completed 06/08/2016 4.Adjuvant antiestrogen therapy with anastrozole 7yearsstarted 06/30/2016  PREVENT trial: [id number] [name] clinical trial: Off Ibrance because of study did not show benefit -------------------------------------------------------------------------------------------------------------------------------------------------------- Anastrozole Toxicities: No hot flashes or myalgias. Patient is tolerating anastrozole extremely well.  Fatigue: Related to her high intensity of work Memory issues: Continues to be a problem. Vaginal dryness: Related to anastrozole We will order a bone density test to be done at breast center.  No clinical concern for disease recurrence. Chest examination 09/14/2021: No palpable lumps or nodules   Return to clinic in1 yearfor follow-up

## 2021-09-14 NOTE — Research (Unsigned)
[place] visit    AFT-05 [name]- Month 60 follow-up visit  Patient came into clinic today by herself to complete the month 35 visit for the [name] study.  Research blood:  The pt's month 60 samples were drawn today.   Anti-cancer medications- Patient states she is continuing to take anastrozole without interruption; she is on no new anti-cancer medications.   Serious adverse events- Patient denies any new, serious health problems since her last visit.     Disease monitoring- Dr. Lindi Adie completed clinical exam during today's visit. See MD notes from today's encounter.   COVID-19- Patient denies any COVID-19 infections in the past year.    The pt is aware that her next research visit in clinic will be in 1 year for her year 6/follow up Phase II visit.  Thanked the pt for her continued participation in this research study.   Brion Aliment RN, BSN, CCRP Clinical Research Nurse Lead 09/14/2021 4:55 PM

## 2021-09-16 ENCOUNTER — Telehealth: Payer: Self-pay | Admitting: Hematology and Oncology

## 2021-09-16 NOTE — Telephone Encounter (Signed)
Scheduled appointment per 9/11 los. Left voicemail.

## 2021-10-27 DIAGNOSIS — G4700 Insomnia, unspecified: Secondary | ICD-10-CM | POA: Diagnosis not present

## 2021-10-27 DIAGNOSIS — Z23 Encounter for immunization: Secondary | ICD-10-CM | POA: Diagnosis not present

## 2021-10-27 DIAGNOSIS — F331 Major depressive disorder, recurrent, moderate: Secondary | ICD-10-CM | POA: Diagnosis not present

## 2021-10-27 DIAGNOSIS — F988 Other specified behavioral and emotional disorders with onset usually occurring in childhood and adolescence: Secondary | ICD-10-CM | POA: Diagnosis not present

## 2021-10-27 DIAGNOSIS — F411 Generalized anxiety disorder: Secondary | ICD-10-CM | POA: Diagnosis not present

## 2021-10-28 ENCOUNTER — Other Ambulatory Visit (HOSPITAL_COMMUNITY): Payer: Self-pay

## 2021-11-03 ENCOUNTER — Other Ambulatory Visit: Payer: Self-pay | Admitting: General Surgery

## 2021-11-03 ENCOUNTER — Other Ambulatory Visit (HOSPITAL_COMMUNITY): Payer: Self-pay | Admitting: General Surgery

## 2021-11-03 DIAGNOSIS — Z170 Estrogen receptor positive status [ER+]: Secondary | ICD-10-CM

## 2021-11-04 ENCOUNTER — Other Ambulatory Visit: Payer: Self-pay | Admitting: Family Medicine

## 2021-11-04 ENCOUNTER — Ambulatory Visit (HOSPITAL_COMMUNITY): Payer: 59

## 2021-11-04 DIAGNOSIS — R58 Hemorrhage, not elsewhere classified: Secondary | ICD-10-CM | POA: Diagnosis not present

## 2021-11-04 DIAGNOSIS — Z719 Counseling, unspecified: Secondary | ICD-10-CM | POA: Diagnosis not present

## 2021-11-04 DIAGNOSIS — J069 Acute upper respiratory infection, unspecified: Secondary | ICD-10-CM | POA: Diagnosis not present

## 2021-11-04 DIAGNOSIS — N63 Unspecified lump in unspecified breast: Secondary | ICD-10-CM

## 2021-11-04 DIAGNOSIS — J309 Allergic rhinitis, unspecified: Secondary | ICD-10-CM | POA: Diagnosis not present

## 2021-11-07 ENCOUNTER — Ambulatory Visit
Admission: RE | Admit: 2021-11-07 | Discharge: 2021-11-07 | Disposition: A | Discharge status: Home / Self Care | Payer: 59 | Source: Ambulatory Visit | Attending: General Surgery | Admitting: General Surgery

## 2021-11-07 DIAGNOSIS — C50411 Malignant neoplasm of upper-outer quadrant of right female breast: Secondary | ICD-10-CM

## 2021-11-07 DIAGNOSIS — D0511 Intraductal carcinoma in situ of right breast: Secondary | ICD-10-CM | POA: Diagnosis not present

## 2021-11-07 MED ORDER — GADOPICLENOL 0.5 MMOL/ML IV SOLN
7.0000 mL | Freq: Once | INTRAVENOUS | Status: AC | PRN
  Administered 2021-11-07: 7 mL via INTRAVENOUS

## 2021-11-10 ENCOUNTER — Other Ambulatory Visit (HOSPITAL_COMMUNITY): Payer: Self-pay

## 2021-11-10 MED ORDER — ADZENYS XR-ODT 12.5 MG PO TBED
12.5000 mg | EXTENDED_RELEASE_TABLET | Freq: Every morning | ORAL | 0 refills | Status: AC
  Filled 2021-11-27 – 2021-12-07 (×3): qty 30, 30d supply, fill #0

## 2021-11-10 MED ORDER — ADZENYS XR-ODT 12.5 MG PO TBED: 12.5000 mg | EXTENDED_RELEASE_TABLET | Freq: Every morning | ORAL | 0 refills | Status: AC

## 2021-11-19 ENCOUNTER — Other Ambulatory Visit: Payer: 59

## 2021-11-25 ENCOUNTER — Other Ambulatory Visit (HOSPITAL_COMMUNITY): Payer: Self-pay

## 2021-11-25 ENCOUNTER — Other Ambulatory Visit: Payer: Self-pay | Admitting: Hematology and Oncology

## 2021-11-25 MED ORDER — ANASTROZOLE 1 MG PO TABS
1.0000 mg | ORAL_TABLET | Freq: Every day | ORAL | 3 refills | Status: DC
  Filled 2021-11-25: qty 90, 90d supply, fill #0
  Filled 2022-02-17: qty 90, 90d supply, fill #1
  Filled 2022-05-17: qty 90, 90d supply, fill #2
  Filled 2022-07-07 – 2022-09-02 (×2): qty 90, 90d supply, fill #3

## 2021-11-27 ENCOUNTER — Other Ambulatory Visit (HOSPITAL_COMMUNITY): Payer: Self-pay

## 2021-12-03 ENCOUNTER — Other Ambulatory Visit (HOSPITAL_COMMUNITY): Payer: Self-pay

## 2021-12-04 ENCOUNTER — Other Ambulatory Visit (HOSPITAL_COMMUNITY): Payer: Self-pay

## 2021-12-07 ENCOUNTER — Other Ambulatory Visit (HOSPITAL_COMMUNITY): Payer: Self-pay

## 2021-12-08 ENCOUNTER — Other Ambulatory Visit (HOSPITAL_COMMUNITY): Payer: Self-pay

## 2021-12-08 MED ORDER — METHYLPHENIDATE HCL ER 20 MG PO TBCR
20.0000 mg | EXTENDED_RELEASE_TABLET | Freq: Two times a day (BID) | ORAL | 0 refills | Status: DC
  Filled 2021-12-08: qty 60, 30d supply, fill #0

## 2021-12-09 ENCOUNTER — Other Ambulatory Visit (HOSPITAL_COMMUNITY): Payer: Self-pay

## 2021-12-09 MED ORDER — SERTRALINE HCL 50 MG PO TABS
50.0000 mg | ORAL_TABLET | Freq: Every morning | ORAL | 3 refills | Status: DC
  Filled 2021-12-09: qty 90, 90d supply, fill #0

## 2021-12-09 MED ORDER — SERTRALINE HCL 50 MG PO TABS
50.0000 mg | ORAL_TABLET | Freq: Every morning | ORAL | 3 refills | Status: AC
  Filled 2021-12-09: qty 90, 90d supply, fill #0

## 2021-12-10 ENCOUNTER — Other Ambulatory Visit (HOSPITAL_COMMUNITY): Payer: Self-pay

## 2021-12-30 DIAGNOSIS — F411 Generalized anxiety disorder: Secondary | ICD-10-CM | POA: Diagnosis not present

## 2021-12-30 DIAGNOSIS — F988 Other specified behavioral and emotional disorders with onset usually occurring in childhood and adolescence: Secondary | ICD-10-CM | POA: Diagnosis not present

## 2021-12-31 ENCOUNTER — Other Ambulatory Visit (HOSPITAL_COMMUNITY): Payer: Self-pay

## 2021-12-31 MED ORDER — AMPHETAMINE-DEXTROAMPHETAMINE 10 MG PO TABS
10.0000 mg | ORAL_TABLET | Freq: Every day | ORAL | 0 refills | Status: DC
  Filled 2021-12-31: qty 90, 90d supply, fill #0

## 2021-12-31 MED ORDER — METHYLPHENIDATE HCL ER (OSM) 36 MG PO TBCR
36.0000 mg | EXTENDED_RELEASE_TABLET | Freq: Every morning | ORAL | 0 refills | Status: DC
  Filled 2021-12-31: qty 90, 90d supply, fill #0

## 2022-01-01 ENCOUNTER — Other Ambulatory Visit (HOSPITAL_COMMUNITY): Payer: Self-pay

## 2022-02-09 ENCOUNTER — Other Ambulatory Visit (HOSPITAL_COMMUNITY): Payer: Self-pay

## 2022-02-09 DIAGNOSIS — F411 Generalized anxiety disorder: Secondary | ICD-10-CM | POA: Diagnosis not present

## 2022-02-09 DIAGNOSIS — F331 Major depressive disorder, recurrent, moderate: Secondary | ICD-10-CM | POA: Diagnosis not present

## 2022-02-09 DIAGNOSIS — G4700 Insomnia, unspecified: Secondary | ICD-10-CM | POA: Diagnosis not present

## 2022-02-09 DIAGNOSIS — F988 Other specified behavioral and emotional disorders with onset usually occurring in childhood and adolescence: Secondary | ICD-10-CM | POA: Diagnosis not present

## 2022-02-10 ENCOUNTER — Other Ambulatory Visit (HOSPITAL_COMMUNITY): Payer: Self-pay

## 2022-02-10 MED ORDER — METHYLPHENIDATE HCL ER (OSM) 54 MG PO TBCR
54.0000 mg | EXTENDED_RELEASE_TABLET | Freq: Every morning | ORAL | 0 refills | Status: DC
  Filled 2022-02-10: qty 30, 30d supply, fill #0

## 2022-02-16 ENCOUNTER — Other Ambulatory Visit (HOSPITAL_COMMUNITY): Payer: Self-pay

## 2022-02-17 ENCOUNTER — Other Ambulatory Visit: Payer: Self-pay

## 2022-02-17 ENCOUNTER — Other Ambulatory Visit (HOSPITAL_COMMUNITY): Payer: Self-pay

## 2022-02-18 ENCOUNTER — Other Ambulatory Visit (HOSPITAL_COMMUNITY): Payer: Self-pay

## 2022-02-18 MED ORDER — SERTRALINE HCL 50 MG PO TABS
50.0000 mg | ORAL_TABLET | ORAL | 3 refills | Status: AC
  Filled 2022-02-18: qty 90, 90d supply, fill #0
  Filled 2022-07-07: qty 90, 90d supply, fill #1
  Filled 2023-01-25: qty 90, 90d supply, fill #2

## 2022-02-19 ENCOUNTER — Other Ambulatory Visit (HOSPITAL_COMMUNITY): Payer: Self-pay

## 2022-02-22 ENCOUNTER — Other Ambulatory Visit (HOSPITAL_COMMUNITY): Payer: Self-pay

## 2022-02-22 DIAGNOSIS — F988 Other specified behavioral and emotional disorders with onset usually occurring in childhood and adolescence: Secondary | ICD-10-CM | POA: Diagnosis not present

## 2022-02-22 MED ORDER — AMPHETAMINE-DEXTROAMPHETAMINE 10 MG PO TABS
10.0000 mg | ORAL_TABLET | Freq: Every day | ORAL | 0 refills | Status: AC
  Filled 2022-02-22 – 2022-05-15 (×2): qty 30, 30d supply, fill #0

## 2022-02-22 MED ORDER — AMPHETAMINE-DEXTROAMPHET ER 15 MG PO CP24
15.0000 mg | ORAL_CAPSULE | ORAL | 0 refills | Status: AC
  Filled 2022-02-22 – 2022-02-25 (×2): qty 30, 30d supply, fill #0

## 2022-02-25 ENCOUNTER — Other Ambulatory Visit (HOSPITAL_COMMUNITY): Payer: Self-pay

## 2022-03-11 ENCOUNTER — Other Ambulatory Visit (HOSPITAL_COMMUNITY): Payer: Self-pay

## 2022-03-11 DIAGNOSIS — F988 Other specified behavioral and emotional disorders with onset usually occurring in childhood and adolescence: Secondary | ICD-10-CM | POA: Diagnosis not present

## 2022-03-11 MED ORDER — ADZENYS XR-ODT 12.5 MG PO TBED
1.0000 | EXTENDED_RELEASE_TABLET | Freq: Every morning | ORAL | 0 refills | Status: AC
  Filled 2022-07-07: qty 30, 30d supply, fill #0

## 2022-03-11 MED ORDER — ADZENYS XR-ODT 12.5 MG PO TBED: 12.5000 mg | EXTENDED_RELEASE_TABLET | ORAL | 0 refills | Status: AC

## 2022-03-11 MED ORDER — ADZENYS XR-ODT 12.5 MG PO TBED
12.5000 mg | EXTENDED_RELEASE_TABLET | ORAL | 0 refills | Status: AC
  Filled 2022-03-11 – 2022-03-16 (×2): qty 30, 30d supply, fill #0

## 2022-03-16 ENCOUNTER — Other Ambulatory Visit (HOSPITAL_COMMUNITY): Payer: Self-pay

## 2022-03-17 ENCOUNTER — Other Ambulatory Visit (HOSPITAL_COMMUNITY): Payer: Self-pay

## 2022-03-17 DIAGNOSIS — M1990 Unspecified osteoarthritis, unspecified site: Secondary | ICD-10-CM | POA: Diagnosis not present

## 2022-03-17 DIAGNOSIS — Z79811 Long term (current) use of aromatase inhibitors: Secondary | ICD-10-CM | POA: Diagnosis not present

## 2022-03-17 DIAGNOSIS — F419 Anxiety disorder, unspecified: Secondary | ICD-10-CM | POA: Diagnosis not present

## 2022-03-17 DIAGNOSIS — M8580 Other specified disorders of bone density and structure, unspecified site: Secondary | ICD-10-CM | POA: Diagnosis not present

## 2022-03-17 DIAGNOSIS — Z853 Personal history of malignant neoplasm of breast: Secondary | ICD-10-CM | POA: Diagnosis not present

## 2022-03-17 DIAGNOSIS — R4189 Other symptoms and signs involving cognitive functions and awareness: Secondary | ICD-10-CM | POA: Diagnosis not present

## 2022-03-18 ENCOUNTER — Other Ambulatory Visit (HOSPITAL_COMMUNITY): Payer: Self-pay

## 2022-04-19 ENCOUNTER — Other Ambulatory Visit (HOSPITAL_COMMUNITY): Payer: Self-pay

## 2022-04-19 MED ORDER — ADZENYS XR-ODT 12.5 MG PO TBED
12.5000 mg | EXTENDED_RELEASE_TABLET | Freq: Every morning | ORAL | 0 refills | Status: AC
  Filled 2022-04-19: qty 30, 30d supply, fill #0

## 2022-04-20 ENCOUNTER — Other Ambulatory Visit (HOSPITAL_COMMUNITY): Payer: Self-pay

## 2022-05-17 ENCOUNTER — Other Ambulatory Visit (HOSPITAL_COMMUNITY): Payer: Self-pay

## 2022-05-25 ENCOUNTER — Other Ambulatory Visit (HOSPITAL_COMMUNITY): Payer: Self-pay

## 2022-05-25 MED ORDER — ADZENYS XR-ODT 12.5 MG PO TBED
1.0000 | EXTENDED_RELEASE_TABLET | Freq: Every morning | ORAL | 0 refills | Status: AC
  Filled 2022-05-25: qty 30, 30d supply, fill #0

## 2022-07-07 ENCOUNTER — Other Ambulatory Visit (HOSPITAL_COMMUNITY): Payer: Self-pay

## 2022-07-07 ENCOUNTER — Other Ambulatory Visit: Payer: Self-pay

## 2022-07-12 ENCOUNTER — Other Ambulatory Visit (HOSPITAL_COMMUNITY): Payer: Self-pay

## 2022-08-26 ENCOUNTER — Telehealth: Payer: Self-pay | Admitting: Hematology and Oncology

## 2022-08-26 NOTE — Telephone Encounter (Signed)
 Left patient a message in regards to rescheduled appointment times/dates

## 2022-08-30 ENCOUNTER — Other Ambulatory Visit (HOSPITAL_COMMUNITY): Payer: Self-pay

## 2022-08-30 ENCOUNTER — Ambulatory Visit
Admission: RE | Admit: 2022-08-30 | Discharge: 2022-08-30 | Disposition: A | Discharge status: Home / Self Care | Payer: 59 | Source: Ambulatory Visit | Attending: Family Medicine | Admitting: Family Medicine

## 2022-08-30 ENCOUNTER — Ambulatory Visit: Payer: 59

## 2022-08-30 VITALS — BP 125/83 | HR 85 | Temp 98.0°F | Resp 18 | Ht 67.0 in | Wt 160.0 lb

## 2022-08-30 DIAGNOSIS — M50321 Other cervical disc degeneration at C4-C5 level: Secondary | ICD-10-CM | POA: Diagnosis not present

## 2022-08-30 DIAGNOSIS — M47812 Spondylosis without myelopathy or radiculopathy, cervical region: Secondary | ICD-10-CM | POA: Diagnosis not present

## 2022-08-30 DIAGNOSIS — M542 Cervicalgia: Secondary | ICD-10-CM

## 2022-08-30 DIAGNOSIS — M8580 Other specified disorders of bone density and structure, unspecified site: Secondary | ICD-10-CM | POA: Diagnosis not present

## 2022-08-30 DIAGNOSIS — R110 Nausea: Secondary | ICD-10-CM

## 2022-08-30 MED ORDER — CYCLOBENZAPRINE HCL 5 MG PO TABS
5.0000 mg | ORAL_TABLET | Freq: Three times a day (TID) | ORAL | 0 refills | Status: AC | PRN
  Filled 2022-08-30: qty 21, 7d supply, fill #0

## 2022-08-30 MED ORDER — ONDANSETRON 4 MG PO TBDP
4.0000 mg | ORAL_TABLET | Freq: Once | ORAL | Status: AC
  Administered 2022-08-30: 4 mg via ORAL
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
## 2022-09-02 ENCOUNTER — Ambulatory Visit
Admission: EM | Admit: 2022-09-02 | Discharge: 2022-09-02 | Disposition: A | Discharge status: Home / Self Care | Payer: 59 | Attending: Internal Medicine | Admitting: Internal Medicine

## 2022-09-02 DIAGNOSIS — M5441 Lumbago with sciatica, right side: Secondary | ICD-10-CM

## 2022-09-02 MED ORDER — PREDNISONE 20 MG PO TABS
40.0000 mg | ORAL_TABLET | Freq: Every day | ORAL | 0 refills | Status: AC
  Filled 2022-09-02 – 2022-09-03 (×2): qty 10, 5d supply, fill #0
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
## 2022-09-02 NOTE — Discharge Instructions (Signed)
I have prescribed you prednisone to decrease inflammation associated with your sciatica.  Please monitor and follow-up if pain persists or worsens.

## 2022-09-03 ENCOUNTER — Other Ambulatory Visit (HOSPITAL_COMMUNITY): Payer: Self-pay

## 2022-09-03 ENCOUNTER — Ambulatory Visit: Payer: 59

## 2022-09-15 ENCOUNTER — Other Ambulatory Visit: Payer: 59

## 2022-09-15 ENCOUNTER — Ambulatory Visit: Payer: 59 | Admitting: Hematology and Oncology

## 2022-09-15 DIAGNOSIS — M542 Cervicalgia: Secondary | ICD-10-CM | POA: Diagnosis not present

## 2022-09-15 DIAGNOSIS — M5126 Other intervertebral disc displacement, lumbar region: Secondary | ICD-10-CM | POA: Diagnosis not present

## 2022-09-15 DIAGNOSIS — M5416 Radiculopathy, lumbar region: Secondary | ICD-10-CM | POA: Diagnosis not present

## 2022-09-23 ENCOUNTER — Other Ambulatory Visit (HOSPITAL_COMMUNITY): Payer: Self-pay

## 2022-09-23 ENCOUNTER — Encounter (HOSPITAL_COMMUNITY): Payer: Self-pay

## 2022-09-23 MED ORDER — ADZENYS XR-ODT 12.5 MG PO TBED
12.5000 mg | EXTENDED_RELEASE_TABLET | Freq: Every morning | ORAL | 0 refills | Status: DC
  Filled 2022-09-23: qty 30, 30d supply, fill #0

## 2022-09-27 ENCOUNTER — Other Ambulatory Visit: Payer: Self-pay

## 2022-09-27 DIAGNOSIS — C50411 Malignant neoplasm of upper-outer quadrant of right female breast: Secondary | ICD-10-CM

## 2022-09-28 ENCOUNTER — Inpatient Hospital Stay: Payer: 59 | Admitting: Hematology and Oncology

## 2022-09-28 ENCOUNTER — Inpatient Hospital Stay: Payer: 59

## 2022-09-28 NOTE — Assessment & Plan Note (Deleted)
Bilateral mastectomies 10/01/2015 With reconstruction Right mastectomy: IDC grade 2, multifocal largest 2.5 cm, with high-grade DCIS, broadly present at  anterior margin and inferior margin 2/5 LN positive, LVI Present,   Left mastectomy: ALH, ER 95%, PR 5-65%, HER-2 negative, Ki-67 10-20%,  Pathologic stage: T2 N1 a stage IIB Mammaprint: High risk CT-CAP and bone scan: No evidence of metastatic disease ----------------------------------------------------------------------------------------------------------------------------------------------------------------- Treatment summary: 1. Adjuvant chemotherapy with dose dense Adriamycin and Cytoxan 4 followed by Abraxane weekly 12 started 11/05/2015 completed 03/17/2016 3. Adjuvant radiation therapy Completed 06/08/2016 4. Adjuvant antiestrogen therapy with anastrozole  7 years started 06/30/2016   PREVENT trial: [id number]  [name] clinical trial: Off Ibrance because of study did not show benefit -------------------------------------------------------------------------------------------------------------------------------------------------------- Anastrozole Toxicities: No hot flashes or myalgias. Patient is tolerating anastrozole extremely well.   Fatigue: Related to her high intensity of work Memory issues: Continues to be a problem.  Vaginal dryness: Related to anastrozole Bone density 09/09/2020: T-score -1.9: Osteopenia     No clinical concern for disease recurrence. Chest examination 09/28/2022: No palpable lumps or nodules   Return to clinic in 1 year for follow-up

## 2022-10-08 ENCOUNTER — Telehealth: Payer: Self-pay | Admitting: Oncology

## 2022-10-08 NOTE — Telephone Encounter (Signed)
10/08/22 - Research - AFT-05 [name] - Follow-up year 6 visit.   Patient returned my call to complete her 6 year visit for the [name] study.  Anti-cancer medications - Patient states she is continuing to take anastrozole every day and she is on no new anti-cancer medications.  Serious adverse events - Patient denies any new, serious health problems since her last visit.  Disease monitoring - Patient had a mammogram completed on 11/09/21 and it was benign.  COVID 19 - Patient denies any COVID-19 infections in the past year.  The patient is aware that next year she will need to have her year 7 research labs drawn and she is in agreement to have those labs done at next year's visit.  The patient was thanked for her continued participation in this research study.  Adella Nissen Research Specialist 10/08/22 - 3:25 pm

## 2022-10-18 ENCOUNTER — Other Ambulatory Visit (HOSPITAL_COMMUNITY): Payer: Self-pay

## 2022-10-20 ENCOUNTER — Other Ambulatory Visit (HOSPITAL_COMMUNITY): Payer: Self-pay

## 2022-10-20 DIAGNOSIS — M542 Cervicalgia: Secondary | ICD-10-CM | POA: Diagnosis not present

## 2022-10-20 MED ORDER — CYCLOBENZAPRINE HCL 10 MG PO TABS
10.0000 mg | ORAL_TABLET | Freq: Two times a day (BID) | ORAL | 0 refills | Status: AC | PRN
  Filled 2022-10-20: qty 50, 25d supply, fill #0

## 2022-10-27 DIAGNOSIS — Z0000 Encounter for general adult medical examination without abnormal findings: Secondary | ICD-10-CM | POA: Diagnosis not present

## 2022-10-28 ENCOUNTER — Other Ambulatory Visit (HOSPITAL_COMMUNITY): Payer: Self-pay

## 2022-10-28 MED ORDER — ADZENYS XR-ODT 12.5 MG PO TBED
1.0000 | EXTENDED_RELEASE_TABLET | ORAL | 0 refills | Status: AC
  Filled 2022-10-28 – 2022-11-21 (×2): qty 30, 30d supply, fill #0

## 2022-10-29 ENCOUNTER — Other Ambulatory Visit (HOSPITAL_BASED_OUTPATIENT_CLINIC_OR_DEPARTMENT_OTHER): Payer: Self-pay | Admitting: Internal Medicine

## 2022-10-29 DIAGNOSIS — M8580 Other specified disorders of bone density and structure, unspecified site: Secondary | ICD-10-CM

## 2022-11-10 ENCOUNTER — Other Ambulatory Visit (HOSPITAL_COMMUNITY): Payer: Self-pay

## 2022-11-22 ENCOUNTER — Other Ambulatory Visit (HOSPITAL_COMMUNITY): Payer: Self-pay

## 2022-12-04 ENCOUNTER — Other Ambulatory Visit (HOSPITAL_COMMUNITY): Payer: Self-pay

## 2022-12-04 ENCOUNTER — Telehealth: Payer: 59 | Admitting: Family Medicine

## 2022-12-04 DIAGNOSIS — J069 Acute upper respiratory infection, unspecified: Secondary | ICD-10-CM | POA: Diagnosis not present

## 2022-12-04 MED ORDER — DOXYCYCLINE HYCLATE 100 MG PO TABS
100.0000 mg | ORAL_TABLET | Freq: Two times a day (BID) | ORAL | 0 refills | Status: AC
  Filled 2022-12-04: qty 14, 7d supply, fill #0

## 2022-12-04 NOTE — Patient Instructions (Signed)
Melanie Barber, thank you for joining Reed Pandy, PA-C for today's virtual visit.  While this provider is not your primary care provider (PCP), if your PCP is located in our provider database this encounter information will be shared with them immediately following your visit.   A [facility] MyChart account gives you access to today's visit and all your visits, tests, and labs performed at [facility] " click here if you don't have a [facility] MyChart account or go to [email or web address]  Consent: (Patient) Melanie Barber provided verbal consent for this virtual visit at the beginning of the encounter.  Current Medications:  Current Outpatient Medications:    doxycycline (VIBRA-TABS) 100 MG tablet, Take 1 tablet (100 mg total) by mouth 2 (two) times daily for 7 days., Disp: 14 tablet, Rfl: 0   Amphetamine ER (ADZENYS XR-ODT) 12.5 MG TBED, Take 1 tablet (12.5 mg) by mouth daily in the morning., Disp: 30 tablet, Rfl: 0   Amphetamine ER (ADZENYS XR-ODT) 12.5 MG TBED, Take 1 tablet (12.5 mg) by mouth in the morning. DNF before 12/27/21, Disp: 30 tablet, Rfl: 0   Amphetamine ER (ADZENYS XR-ODT) 12.5 MG TBED, Take 1 tablet (12.5 mg) by mouth in the morning., Disp: 30 tablet, Rfl: 0   Amphetamine ER (ADZENYS XR-ODT) 12.5 MG TBED, Take 1 tablet by mouth in the morning., Disp: 30 tablet, Rfl: 0   Amphetamine ER (ADZENYS XR-ODT) 12.5 MG TBED, Take 1 tablet by mouth every morning., Disp: 30 tablet, Rfl: 0   Amphetamine ER (ADZENYS XR-ODT) 12.5 MG TBED, Dissolve 1 tablet in mouth daily in the morning, Disp: 30 tablet, Rfl: 0   Amphetamine ER (ADZENYS XR-ODT) 12.5 MG TBED, Dissolve 1 tablet (12.5 mg total) by mouth in the morning., Disp: 30 tablet, Rfl: 0   Amphetamine ER (ADZENYS XR-ODT) 12.5 MG TBED, Take 1 tablet by mouth in the morning., Disp: 30 tablet, Rfl: 0   Amphetamine ER (ADZENYS XR-ODT) 12.5 MG TBED, Take 1 tablet by mouth every morning., Disp: 30 tablet, Rfl: 0    amphetamine-dextroamphetamine (ADDERALL XR) 15 MG 24 hr capsule, Take 1 capsule by mouth every morning., Disp: 30 capsule, Rfl: 0   amphetamine-dextroamphetamine (ADDERALL) 10 MG tablet, Take 1 tablet (10 mg total) by mouth daily in the afternoon if needed, Disp: 30 tablet, Rfl: 0   anastrozole (ARIMIDEX) 1 MG tablet, Take 1 tablet (1 mg total) by mouth daily., Disp: 90 tablet, Rfl: 3   Calcium Carbonate-Vitamin D (CALCIUM 600+D PO), Take 1 tablet by mouth daily., Disp: , Rfl:    cyclobenzaprine (FLEXERIL) 10 MG tablet, Take 1 tablet (10 mg total) by mouth 2 (two) times daily as needed., Disp: 50 tablet, Rfl: 0   cyclobenzaprine (FLEXERIL) 5 MG tablet, Take 1 tablet (5 mg total) by mouth 3 (three) times daily as needed for muscle spasms., Disp: 21 tablet, Rfl: 0   Gluc-Chonn-MSM-Boswellia-Vit D (GLUCOS CHONDROIT, BOSWELLIA, PO), Take by mouth., Disp: , Rfl:    methylphenidate (CONCERTA) 54 MG PO CR tablet, Take 1 tablet (54 mg total) by mouth in the morning., Disp: 30 tablet, Rfl: 0   methylphenidate (METADATE ER) 20 MG ER tablet, Take 1 tablet (20 mg total) by mouth 2 (two) times daily., Disp: 60 tablet, Rfl: 0   methylphenidate (CONCERTA) 36 MG PO CR tablet, Take 1 tablet (36 mg total) by mouth in the morning., Disp: 90 tablet, Rfl: 0   Multiple Vitamin (MULTIVITAMIN) tablet, Take 1 tablet by mouth daily., Disp: , Rfl:  sertraline (ZOLOFT) 50 MG tablet, Take 1 tablet (50 mg total) by mouth in the morning., Disp: 90 tablet, Rfl: 3   sertraline (ZOLOFT) 50 MG tablet, Take 1 tablet (50 mg total) by mouth every morning., Disp: 90 tablet, Rfl: 3   Medications ordered in this encounter:  Meds ordered this encounter  Medications   doxycycline (VIBRA-TABS) 100 MG tablet    Sig: Take 1 tablet (100 mg total) by mouth 2 (two) times daily for 7 days.    Dispense:  14 tablet    Refill:  0     *If you need refills on other medications prior to your next appointment, please contact your  pharmacy*  Follow-Up: Call back or seek an in-person evaluation if the symptoms worsen or if the condition fails to improve as anticipated.   Virtual Care [phone]  Other Instructions Upper Respiratory Infection, Adult An upper respiratory infection (URI) is a common viral infection of the nose, throat, and upper air passages that lead to the lungs. The most common type of URI is the common cold. URIs usually get better on their own, without medical treatment. What are the causes? A URI is caused by a virus. You may catch a virus by: Breathing in droplets from an infected person's cough or sneeze. Touching something that has been exposed to the virus (is contaminated) and then touching your mouth, nose, or eyes. What increases the risk? You are more likely to get a URI if: You are very young or very old. You have close contact with others, such as at work, school, or a health care facility. You smoke. You have long-term (chronic) heart or lung disease. You have a weakened disease-fighting system (immune system). You have nasal allergies or asthma. You are experiencing a lot of stress. You have poor nutrition. What are the signs or symptoms? A URI usually involves some of the following symptoms: Runny or stuffy (congested) nose. Cough. Sneezing. Sore throat. Headache. Fatigue. Fever. Loss of appetite. Pain in your forehead, behind your eyes, and over your cheekbones (sinus pain). Muscle aches. Redness or irritation of the eyes. Pressure in the ears or face. How is this diagnosed? This condition may be diagnosed based on your medical history and symptoms, and a physical exam. Your health care provider may use a swab to take a mucus sample from your nose (nasal swab). This sample can be tested to determine what virus is causing the illness. How is this treated? URIs usually get better on their own within 7-10 days. Medicines cannot cure URIs, but your health  care provider may recommend certain medicines to help relieve symptoms, such as: Over-the-counter cold medicines. Cough suppressants. Coughing is a type of defense against infection that helps to clear the respiratory system, so take these medicines only as recommended by your health care provider. Fever-reducing medicines. Follow these instructions at home: Activity Rest as needed. If you have a fever, stay home from work or school until your fever is gone or until your health care provider says your URI cannot spread to other people (is no longer contagious). Your health care provider may have you wear a face mask to prevent your infection from spreading. Relieving symptoms Gargle with a mixture of salt and water 3-4 times a day or as needed. To make salt water, completely dissolve -1 tsp (3-6 g) of salt in 1 cup (237 mL) of warm water. Use a cool-mist humidifier to add moisture to the air. This can help you  breathe more easily. Eating and drinking  Drink enough fluid to keep your urine pale yellow. Eat soups and other clear broths. General instructions  Take over-the-counter and prescription medicines only as told by your health care provider. These include cold medicines, fever reducers, and cough suppressants. Do not use any products that contain nicotine or tobacco. These products include cigarettes, chewing tobacco, and vaping devices, such as e-cigarettes. If you need help quitting, ask your health care provider. Stay away from secondhand smoke. Stay up to date on all immunizations, including the yearly (annual) flu vaccine. Keep all follow-up visits. This is important. How to prevent the spread of infection to others URIs can be contagious. To prevent the infection from spreading: Wash your hands with soap and water for at least 20 seconds. If soap and water are not available, use hand sanitizer. Avoid touching your mouth, face, eyes, or nose. Cough or sneeze into a tissue or your  sleeve or elbow instead of into your hand or into the air.  Contact a health care provider if: You are getting worse instead of better. You have a fever or chills. Your mucus is brown or red. You have yellow or brown discharge coming from your nose. You have pain in your face, especially when you bend forward. You have swollen neck glands. You have pain while swallowing. You have white areas in the back of your throat. Get help right away if: You have shortness of breath that gets worse. You have severe or persistent: Headache. Ear pain. Sinus pain. Chest pain. You have chronic lung disease along with any of the following: Making high-pitched whistling sounds when you breathe, most often when you breathe out (wheezing). Prolonged cough (more than 14 days). Coughing up blood. A change in your usual mucus. You have a stiff neck. You have changes in your: Vision. Hearing. Thinking. Mood. These symptoms may be an emergency. Get help right away. Call 911. Do not wait to see if the symptoms will go away. Do not drive yourself to the hospital. Summary An upper respiratory infection (URI) is a common infection of the nose, throat, and upper air passages that lead to the lungs. A URI is caused by a virus. URIs usually get better on their own within 7-10 days. Medicines cannot cure URIs, but your health care provider may recommend certain medicines to help relieve symptoms. This information is not intended to replace advice given to you by your health care provider. Make sure you discuss any questions you have with your health care provider. Document Revised: 07/23/2020 Document Reviewed: 07/23/2020 Elsevier Patient Education  2024 Elsevier Inc.    If you have been instructed to have an in-person evaluation today at a local Urgent Care facility, please use the link below. It will take you to a list of all of our available [facility], including address, phone number and  hours of operation. Please do not delay care.  [facility]  If you or a family member do not have a primary care provider, use the link below to schedule a visit and establish care. When you choose a [facility] primary care physician or advanced practice provider, you gain a long-term partner in health. Find a Primary Care Provider  Learn more about [facility] in-office and virtual care options: [facility] - Get Care Now

## 2022-12-04 NOTE — Progress Notes (Signed)
Virtual Visit Consent   Melanie Barber, you are scheduled for a virtual visit with a [facility] provider today. Just as with appointments in the office, your consent must be obtained to participate. Your consent will be active for this visit and any virtual visit you may have with one of our providers in the next 365 days. If you have a MyChart account, a copy of this consent can be sent to you electronically.  As this is a virtual visit, video technology does not allow for your provider to perform a traditional examination. This may limit your provider's ability to fully assess your condition. If your provider identifies any concerns that need to be evaluated in person or the need to arrange testing (such as labs, EKG, etc.), we will make arrangements to do so. Although advances in technology are sophisticated, we cannot ensure that it will always work on either your end or our end. If the connection with a video visit is poor, the visit may have to be switched to a telephone visit. With either a video or telephone visit, we are not always able to ensure that we have a secure connection.  By engaging in this virtual visit, you consent to the provision of healthcare and authorize for your insurance to be billed (if applicable) for the services provided during this visit. Depending on your insurance coverage, you may receive a charge related to this service.  I need to obtain your verbal consent now. Are you willing to proceed with your visit today? Melanie Barber has provided verbal consent on 12/04/2022 for a virtual visit (video or telephone). Reed Pandy, [place]  Date: 12/04/2022 11:34 AM  Virtual Visit via Video Note   I, Reed Pandy, connected with  Melanie Barber  ([id number], [birth year]) on 12/04/22 at 11:30 AM EST by a video-enabled telemedicine application and verified that I am speaking with the correct person using two identifiers.  Location: Patient: Virtual Visit Location  Patient: Home Provider: Virtual Visit Location Provider: Home Office   I discussed the limitations of evaluation and management by telemedicine and the availability of in person appointments. The patient expressed understanding and agreed to proceed.    History of Present Illness: Melanie Barber is a 57 y.o. who identifies as a female who was assigned female at birth, and is being seen today for c/o started to have a really bad sore throat Thursday night.  Pt states Friday started to have body aches, fever and drenched in sweat.  Pt took a covid test and it was negative. Pt states she has really bad weakness.  Pt states she had cancer treatment three years ago and was on chemo and since the chemo when she becomes very weak when she is sick.  Pt states she is very congested and has a headache that is constant. Pt states coughing up yellow-green phlegm that began to worsen yesterday. Pt states she is taking over the counter day and night medicine.   HPI: HPI  Problems:  Patient Active Problem List   Diagnosis Date Noted   Cognitive changes 09/04/2019   History of breast cancer 12/20/2016   History of bilateral breast cancer 06/22/2016   Genetic testing 11/17/2015   Malignant neoplasm of upper-outer quadrant of right breast in female, estrogen receptor positive (HCC) 10/09/2015   Breast cancer of upper-outer quadrant of right female breast (HCC) 09/12/2015    Allergies:  Allergies  Allergen Reactions   Keflex [Cephalexin] Anaphylaxis   Medications:  Current Outpatient Medications:    doxycycline (VIBRA-TABS) 100 MG tablet, Take 1 tablet (100 mg total) by mouth 2 (two) times daily for 7 days., Disp: 14 tablet, Rfl: 0   Amphetamine ER (ADZENYS XR-ODT) 12.5 MG TBED, Take 1 tablet (12.5 mg) by mouth daily in the morning., Disp: 30 tablet, Rfl: 0   Amphetamine ER (ADZENYS XR-ODT) 12.5 MG TBED, Take 1 tablet (12.5 mg) by mouth in the morning. DNF before 12/27/21, Disp: 30 tablet, Rfl: 0    Amphetamine ER (ADZENYS XR-ODT) 12.5 MG TBED, Take 1 tablet (12.5 mg) by mouth in the morning., Disp: 30 tablet, Rfl: 0   Amphetamine ER (ADZENYS XR-ODT) 12.5 MG TBED, Take 1 tablet by mouth in the morning., Disp: 30 tablet, Rfl: 0   Amphetamine ER (ADZENYS XR-ODT) 12.5 MG TBED, Take 1 tablet by mouth every morning., Disp: 30 tablet, Rfl: 0   Amphetamine ER (ADZENYS XR-ODT) 12.5 MG TBED, Dissolve 1 tablet in mouth daily in the morning, Disp: 30 tablet, Rfl: 0   Amphetamine ER (ADZENYS XR-ODT) 12.5 MG TBED, Dissolve 1 tablet (12.5 mg total) by mouth in the morning., Disp: 30 tablet, Rfl: 0   Amphetamine ER (ADZENYS XR-ODT) 12.5 MG TBED, Take 1 tablet by mouth in the morning., Disp: 30 tablet, Rfl: 0   Amphetamine ER (ADZENYS XR-ODT) 12.5 MG TBED, Take 1 tablet by mouth every morning., Disp: 30 tablet, Rfl: 0   amphetamine-dextroamphetamine (ADDERALL XR) 15 MG 24 hr capsule, Take 1 capsule by mouth every morning., Disp: 30 capsule, Rfl: 0   amphetamine-dextroamphetamine (ADDERALL) 10 MG tablet, Take 1 tablet (10 mg total) by mouth daily in the afternoon if needed, Disp: 30 tablet, Rfl: 0   anastrozole (ARIMIDEX) 1 MG tablet, Take 1 tablet (1 mg total) by mouth daily., Disp: 90 tablet, Rfl: 3   Calcium Carbonate-Vitamin D (CALCIUM 600+D PO), Take 1 tablet by mouth daily., Disp: , Rfl:    cyclobenzaprine (FLEXERIL) 10 MG tablet, Take 1 tablet (10 mg total) by mouth 2 (two) times daily as needed., Disp: 50 tablet, Rfl: 0   cyclobenzaprine (FLEXERIL) 5 MG tablet, Take 1 tablet (5 mg total) by mouth 3 (three) times daily as needed for muscle spasms., Disp: 21 tablet, Rfl: 0   Gluc-Chonn-MSM-Boswellia-Vit D (GLUCOS CHONDROIT, BOSWELLIA, PO), Take by mouth., Disp: , Rfl:    methylphenidate (CONCERTA) 54 MG PO CR tablet, Take 1 tablet (54 mg total) by mouth in the morning., Disp: 30 tablet, Rfl: 0   methylphenidate (METADATE ER) 20 MG ER tablet, Take 1 tablet (20 mg total) by mouth 2 (two) times daily., Disp:  60 tablet, Rfl: 0   methylphenidate (CONCERTA) 36 MG PO CR tablet, Take 1 tablet (36 mg total) by mouth in the morning., Disp: 90 tablet, Rfl: 0   Multiple Vitamin (MULTIVITAMIN) tablet, Take 1 tablet by mouth daily., Disp: , Rfl:    sertraline (ZOLOFT) 50 MG tablet, Take 1 tablet (50 mg total) by mouth in the morning., Disp: 90 tablet, Rfl: 3   sertraline (ZOLOFT) 50 MG tablet, Take 1 tablet (50 mg total) by mouth every morning., Disp: 90 tablet, Rfl: 3  Observations/Objective: Patient is well-developed, well-nourished in no acute distress.  But does appear ill Resting comfortably at home.  Head is normocephalic, atraumatic.  No labored breathing.  Speech is clear and coherent with logical content.  Patient is alert and oriented at baseline.    Assessment and Plan: 1. Upper respiratory tract infection, unspecified type - doxycycline (VIBRA-TABS) 100 MG  tablet; Take 1 tablet (100 mg total) by mouth 2 (two) times daily for 7 days.  Dispense: 14 tablet; Refill: 0  -Advised Pt if worsening symptoms to follow up with PCP or urgent care  -Pt verbalized understanding   Follow Up Instructions: I discussed the assessment and treatment plan with the patient. The patient was provided an opportunity to ask questions and all were answered. The patient agreed with the plan and demonstrated an understanding of the instructions.  A copy of instructions were sent to the patient via MyChart unless otherwise noted below.     The patient was advised to call back or seek an in-person evaluation if the symptoms worsen or if the condition fails to improve as anticipated.    Reed Pandy, PA-C

## 2022-12-06 ENCOUNTER — Other Ambulatory Visit: Payer: Self-pay | Admitting: Medical Genetics

## 2022-12-07 ENCOUNTER — Other Ambulatory Visit (HOSPITAL_COMMUNITY): Payer: Self-pay

## 2022-12-07 MED ORDER — GUAIFENESIN-CODEINE 100-10 MG/5ML PO SOLN
10.0000 mL | Freq: Four times a day (QID) | ORAL | 0 refills | Status: AC | PRN
  Filled 2022-12-07: qty 200, 5d supply, fill #0

## 2022-12-07 MED ORDER — BENZONATATE 200 MG PO CAPS
200.0000 mg | ORAL_CAPSULE | Freq: Three times a day (TID) | ORAL | 0 refills | Status: AC | PRN
  Filled 2022-12-07: qty 30, 10d supply, fill #0

## 2022-12-08 ENCOUNTER — Other Ambulatory Visit: Payer: Self-pay | Admitting: Medical Genetics

## 2022-12-10 ENCOUNTER — Other Ambulatory Visit (HOSPITAL_COMMUNITY): Payer: Self-pay

## 2022-12-10 ENCOUNTER — Inpatient Hospital Stay (HOSPITAL_BASED_OUTPATIENT_CLINIC_OR_DEPARTMENT_OTHER): Admission: RE | Admit: 2022-12-10 | Payer: 59 | Source: Ambulatory Visit

## 2022-12-10 ENCOUNTER — Other Ambulatory Visit: Payer: Self-pay | Admitting: Hematology and Oncology

## 2022-12-10 DIAGNOSIS — Z1283 Encounter for screening for malignant neoplasm of skin: Secondary | ICD-10-CM | POA: Diagnosis not present

## 2022-12-10 DIAGNOSIS — D225 Melanocytic nevi of trunk: Secondary | ICD-10-CM | POA: Diagnosis not present

## 2022-12-13 ENCOUNTER — Other Ambulatory Visit (HOSPITAL_COMMUNITY): Payer: Self-pay

## 2022-12-13 ENCOUNTER — Telehealth: Payer: Self-pay

## 2022-12-13 NOTE — Telephone Encounter (Signed)
Called pt to LVM as we received refill request for Anastrozole. Pt cancelled MD appt 09/28/22 and has not r/s. She also cancelled her DEXA which was scheduled for 12/10/22. LVM for pt to return call to discuss appts.

## 2022-12-13 NOTE — Telephone Encounter (Signed)
Nelly Rout, LPN called pt to LVM as we received refill request for Anastrozole. Pt cancelled MD appt 09/28/22 and has not r/s. She also cancelled her DEXA which was scheduled for 12/10/22. LVM for pt to return call to discuss appts.

## 2022-12-14 ENCOUNTER — Encounter (HOSPITAL_COMMUNITY): Payer: Self-pay

## 2022-12-14 ENCOUNTER — Other Ambulatory Visit (HOSPITAL_COMMUNITY): Payer: Self-pay

## 2022-12-15 ENCOUNTER — Other Ambulatory Visit (HOSPITAL_COMMUNITY): Payer: Self-pay

## 2022-12-15 ENCOUNTER — Other Ambulatory Visit (HOSPITAL_COMMUNITY): Payer: Self-pay | Attending: Medical Genetics

## 2022-12-15 ENCOUNTER — Other Ambulatory Visit: Payer: Self-pay | Admitting: Hematology and Oncology

## 2022-12-15 MED ORDER — ANASTROZOLE 1 MG PO TABS
1.0000 mg | ORAL_TABLET | Freq: Every day | ORAL | 3 refills | Status: DC
  Filled 2022-12-15: qty 90, 90d supply, fill #0
  Filled 2023-01-25 – 2023-02-25 (×2): qty 90, 90d supply, fill #1
  Filled 2023-05-10: qty 90, 90d supply, fill #2
  Filled 2023-08-30: qty 90, 90d supply, fill #3

## 2022-12-17 ENCOUNTER — Other Ambulatory Visit (HOSPITAL_COMMUNITY): Payer: Self-pay

## 2022-12-24 ENCOUNTER — Ambulatory Visit
Admission: RE | Admit: 2022-12-24 | Discharge: 2022-12-24 | Disposition: A | Discharge status: Home / Self Care | Payer: 59 | Source: Ambulatory Visit | Attending: Physician Assistant | Admitting: Physician Assistant

## 2022-12-24 ENCOUNTER — Ambulatory Visit (INDEPENDENT_AMBULATORY_CARE_PROVIDER_SITE_OTHER): Payer: 59

## 2022-12-24 VITALS — BP 102/73 | HR 103 | Temp 98.4°F | Resp 20 | Ht 67.0 in | Wt 160.1 lb

## 2022-12-24 DIAGNOSIS — R059 Cough, unspecified: Secondary | ICD-10-CM | POA: Diagnosis not present

## 2022-12-24 DIAGNOSIS — J209 Acute bronchitis, unspecified: Secondary | ICD-10-CM | POA: Diagnosis not present

## 2022-12-24 MED ORDER — PREDNISONE 20 MG PO TABS: 40.0000 mg | ORAL_TABLET | Freq: Every day | ORAL | 0 refills | Status: AC

## 2022-12-24 NOTE — ED Triage Notes (Signed)
I had bronchitis (?) and took a round of doxycycline.  This started on Thanksgiving day. My cough, congestion and fatigue continues. - Entered by patient  "When it all started I had a Fever none now".

## 2023-01-04 NOTE — ED Provider Notes (Signed)
[name]    CSN: [id number] Arrival date & time: 12/24/22  1603      History   Chief Complaint Chief Complaint  Patient presents with   Cough    HPI Melanie Barber is a 57 y.o. female.   Patient here today for evaluation of cough and congestion is continued for the last several weeks.  She reports that she took a round of antibiotics (doxycycline) without resolution.  She reports she continues to have congestion as well.  She did have fever initially but this is resolved.  The history is provided by the patient.  Cough Associated symptoms: no chills, no ear pain, no eye discharge, no fever, no shortness of breath, no sore throat and no wheezing     Past Medical History:  Diagnosis Date   Allergy    Arthritis    right knee (12/20/2016)   Cancer of right breast (HCC)    Depression    PONV (postoperative nausea and vomiting)    Sciatic leg pain    left leg    Patient Active Problem List   Diagnosis Date Noted   Cognitive changes 09/04/2019   History of breast cancer 12/20/2016   History of therapeutic radiation 08/04/2016   HNP (herniated nucleus pulposus), lumbar 07/19/2016   Lumbar radiculopathy 07/19/2016   History of bilateral breast cancer 06/22/2016   Genetic testing 11/17/2015   Malignant neoplasm of upper-outer quadrant of right breast in female, estrogen receptor positive (HCC) 10/09/2015   Acquired absence of both breasts 10/09/2015   Breast cancer of upper-outer quadrant of right female breast (HCC) 09/12/2015   Arthritis of knee 07/01/2011   Back pain 07/01/2011   Knee pain 07/01/2011   Patellofemoral misalignment with pain 07/01/2011    Past Surgical History:  Procedure Laterality Date   ABDOMINAL HYSTERECTOMY     BREAST BIOPSY Right    BREAST CYST ASPIRATION Right    BREAST RECONSTRUCTION WITH PLACEMENT OF TISSUE EXPANDER AND FLEX HD (ACELLULAR HYDRATED DERMIS) Bilateral 10/01/2015   BREAST RECONSTRUCTION WITH PLACEMENT OF  TISSUE EXPANDER AND FLEX HD (ACELLULAR HYDRATED DERMIS) Bilateral 10/01/2015   Procedure: BREAST RECONSTRUCTION WITH PLACEMENT OF TISSUE EXPANDER AND CORTIVA;  Surgeon: Glenna Fellows, MD;  Location: [facility] OR;  Service: Plastics;  Laterality: Bilateral;   CERVIX SURGERY  1993   growth removed   LATISSIMUS FLAP TO BREAST Right 12/20/2016   LATISSIMUS FLAP TO BREAST Right 12/20/2016   Procedure: RIGHT LATISSIMUS FLAP TO BREAST;  Surgeon: Glenna Fellows, MD;  Location: [facility] OR;  Service: Plastics;  Laterality: Right;   LIPOSUCTION WITH LIPOFILLING N/A 12/20/2016   Procedure: LIPOSUCTION WITH LIPOFILLING;  Surgeon: Glenna Fellows, MD;  Location: [facility] OR;  Service: Plastics;  Laterality: N/A;   MASTECTOMY Left 10/01/2015   NIPPLE SPARING   MASTECTOMY COMPLETE / SIMPLE W/ SENTINEL NODE BIOPSY Right 10/01/2015   NIPPLE SPARING   NASAL/SINUS ENDOSCOPY Bilateral 2005   NIPPLE SPARING MASTECTOMY/SENTINAL LYMPH NODE BIOPSY/RECONSTRUCTION/PLACEMENT OF TISSUE EXPANDER Bilateral 10/01/2015   Procedure: BILATERAL NIPPLE SPARING MASTECTOMY WITH RIGHT SENTINAL LYMPH NODE BIOPSY;  Surgeon: Emelia Loron, MD;  Location: [facility] OR;  Service: General;  Laterality: Bilateral;   PORT-A-CATH REMOVAL Right 04/01/2016   Procedure: MINOR REMOVAL PORT-A-CATH;  Surgeon: Emelia Loron, MD;  Location: [facility];  Service: General;  Laterality: Right;   PORTACATH PLACEMENT Right 10/01/2015   Procedure: INSERTION PORT-A-CATH;  Surgeon: Emelia Loron, MD;  Location: [facility] OR;  Service: General;  Laterality: Right;   REMOVAL OF BILATERAL  TISSUE EXPANDERS WITH PLACEMENT OF BILATERAL BREAST IMPLANTS Bilateral 12/20/2016   SILICONE BREAST IMPLANTS, LIPOFILLING FROM ABDOMEN TO BILATERAL CHESTBilateral     REMOVAL OF BILATERAL TISSUE EXPANDERS WITH PLACEMENT OF BILATERAL BREAST IMPLANTS Bilateral 12/20/2016   Procedure: REMOVAL OF BILATERAL TISSUE EXPANDERS WITH PLACEMENT OF BILATERAL SILICONE BREAST IMPLANTS,  LIPOFILLING FROM ABDOMEN TO BILATERAL CHEST;  Surgeon: Glenna Fellows, MD;  Location: [facility] OR;  Service: Plastics;  Laterality: Bilateral;   ROBOTIC ASSISTED TOTAL HYSTERECTOMY WITH BILATERAL SALPINGO OOPHERECTOMY Bilateral 06/22/2016   Procedure: XI ROBOTIC ASSISTED TOTAL HYSTERECTOMY WITH BILATERAL SALPINGO OOPHORECTOMY;  Surgeon: Adolphus Birchwood, MD;  Location: [facility];  Service: Gynecology;  Laterality: Bilateral;   TOE SURGERY Left 1990   "bone spur on great toe"   TONSILLECTOMY  1977   TONSILLECTOMY     ULTRASOUND GUIDANCE FOR VASCULAR ACCESS Right 10/01/2015   Procedure: ULTRASOUND GUIDANCE;  Surgeon: Emelia Loron, MD;  Location: [facility] OR;  Service: General;  Laterality: Right;   WISDOM TOOTH EXTRACTION      OB History   No obstetric history on file.      Home Medications    Prior to Admission medications   Medication Sig Start Date End Date Taking? Authorizing Provider  acetaminophen (TYLENOL) 500 MG tablet Take 500 mg by mouth every 4 (four) hours as needed. 10/09/15  Yes [provider]  Amphetamine ER (ADZENYS XR-ODT) 12.5 MG TBED Take 1 tablet by mouth every morning. 10/28/22  Yes   Biotin 5000 MCG CAPS Take 1 capsule by mouth daily. 08/04/16  Yes [provider]  bisacodyl (DULCOLAX) 5 MG EC tablet Take 5 mg by mouth daily as needed. 12/29/16  Yes [provider]  buPROPion (WELLBUTRIN XL) 300 MG 24 hr tablet Take 300 mg by mouth daily. 07/13/17  Yes [provider]  Docusate Sodium (DSS) 100 MG CAPS Take 100 mg by mouth as directed. 12/29/16  Yes [provider]  Melatonin 5 MG CAPS Take 5 mg by mouth as directed. 12/29/16  Yes [provider]  methocarbamol (ROBAXIN) 500 MG tablet Take 500 mg by mouth as directed. 12/29/16  Yes [provider]  Multiple Vitamin (MULTIVITAMIN PO) Take 1 tablet by mouth daily. 04/27/12  Yes [provider]  naproxen sodium (ALEVE) 220 MG tablet Take 220 mg by mouth 2 (two)  times daily as needed. 04/27/12  Yes [provider]  ondansetron (ZOFRAN) 8 MG tablet Take 8 mg by mouth every 8 (eight) hours as needed. 11/12/16  Yes [provider]  palbociclib Ilda Foil) 75 MG capsule Take 75 mg by mouth daily with breakfast. 10/22/16  Yes [provider]  sertraline (ZOLOFT) 50 MG tablet Take 1 tablet (50 mg total) by mouth every morning. 02/18/22  Yes   venlafaxine (EFFEXOR) 37.5 MG tablet Take 37.5 mg by mouth as directed. 10/27/16  Yes [provider]  Amphetamine ER (ADZENYS XR-ODT) 12.5 MG TBED Take 1 tablet (12.5 mg) by mouth daily in the morning. 12/17/20     Amphetamine ER (ADZENYS XR-ODT) 12.5 MG TBED Take 1 tablet (12.5 mg) by mouth in the morning. DNF before 12/27/21 12/27/21     Amphetamine ER (ADZENYS XR-ODT) 12.5 MG TBED Take 1 tablet (12.5 mg) by mouth in the morning. 11/27/21     Amphetamine ER (ADZENYS XR-ODT) 12.5 MG TBED Take 1 tablet by mouth in the morning. 04/11/22     Amphetamine ER (ADZENYS XR-ODT) 12.5 MG TBED Take 1 tablet by mouth every morning. 05/11/22  Amphetamine ER (ADZENYS XR-ODT) 12.5 MG TBED Dissolve 1 tablet in mouth daily in the morning 03/11/22     Amphetamine ER (ADZENYS XR-ODT) 12.5 MG TBED Dissolve 1 tablet (12.5 mg total) by mouth in the morning. 04/19/22     Amphetamine ER (ADZENYS XR-ODT) 12.5 MG TBED Take 1 tablet by mouth in the morning. 05/25/22     amphetamine-dextroamphetamine (ADDERALL XR) 15 MG 24 hr capsule Take 1 capsule by mouth every morning. 02/22/22     amphetamine-dextroamphetamine (ADDERALL) 10 MG tablet Take 1 tablet (10 mg total) by mouth daily in the afternoon if needed 02/22/22     anastrozole (ARIMIDEX) 1 MG tablet Take 1 tablet (1 mg total) by mouth daily. 12/15/22   Serena Croissant, MD  Calcium Carbonate-Vitamin D (CALCIUM 600+D PO) Take 1 tablet by mouth daily.    [provider]  cyclobenzaprine (FLEXERIL) 10 MG tablet Take 1 tablet (10 mg total) by mouth 2 (two) times daily as  needed. 10/20/22     cyclobenzaprine (FLEXERIL) 5 MG tablet Take 1 tablet (5 mg total) by mouth 3 (three) times daily as needed for muscle spasms. 08/30/22   Piontek, Denny Peon, MD  Gluc-Chonn-MSM-Boswellia-Vit D (GLUCOS CHONDROIT, BOSWELLIA, PO) Take by mouth.    [provider]  methylphenidate (CONCERTA) 54 MG PO CR tablet Take 1 tablet (54 mg total) by mouth in the morning. 02/09/22     methylphenidate (METADATE ER) 20 MG ER tablet Take 1 tablet (20 mg total) by mouth 2 (two) times daily. 12/08/21     methylphenidate (CONCERTA) 36 MG PO CR tablet Take 1 tablet (36 mg total) by mouth in the morning. 12/30/21     Multiple Vitamin (MULTIVITAMIN) tablet Take 1 tablet by mouth daily.    [provider]  sertraline (ZOLOFT) 50 MG tablet Take 1 tablet (50 mg total) by mouth in the morning. 10/27/21       Family History Family History  Problem Relation Age of Onset   Depression Mother    Inflammatory bowel disease Mother    Alcohol abuse Mother    Bipolar disorder Mother    Hypertension Son    Other Brother        severe intellectual disabilities   Arthritis Maternal Uncle        hx knee replacement surgeries   Stroke Maternal Grandmother 86   Breast cancer Maternal Grandmother        dx before menopause   Uterine cancer Maternal Grandmother        dx late 60s   Bipolar disorder Maternal Grandfather    Stroke Paternal Grandmother 42   Diabetes Paternal Grandmother        later in life   Heart attack Paternal Grandfather    Colon cancer Neg Hx    Esophageal cancer Neg Hx    Rectal cancer Neg Hx    Stomach cancer Neg Hx     Social History Social History   Tobacco Use   Smoking status: Never   Smokeless tobacco: Never  Vaping Use   Vaping status: Never Used  Substance Use Topics   Alcohol use: Yes    Alcohol/week: 2.0 standard drinks of alcohol    Types: 2 Glasses of wine per week    Comment: Occassionally.   Drug use: No     Allergies   Keflex  [cephalexin]   Review of Systems Review of Systems  Constitutional:  Negative for chills and fever.  HENT:  Positive for congestion. Negative for  ear pain and sore throat.   Eyes:  Negative for discharge and redness.  Respiratory:  Positive for cough. Negative for shortness of breath and wheezing.   Gastrointestinal:  Negative for abdominal pain, diarrhea, nausea and vomiting.     Physical Exam Triage Vital Signs ED Triage Vitals  Encounter Vitals Group     BP 12/24/22 1625 102/73     Systolic BP Percentile --      Diastolic BP Percentile --      Pulse Rate 12/24/22 1625 (!) 103     Resp 12/24/22 1625 20     Temp 12/24/22 1625 98.4 F (36.9 C)     Temp Source 12/24/22 1625 Oral     SpO2 12/24/22 1625 99 %     Weight 12/24/22 1621 160 lb 0.9 oz (72.6 kg)     Height 12/24/22 1621 5\' 7"  (1.702 m)     Head Circumference --      Peak Flow --      Pain Score 12/24/22 1616 0     Pain Loc --      Pain Education --      Exclude from Growth Chart --    No data found.  Updated Vital Signs BP 102/73 (BP Location: Left Arm)   Pulse (!) 103   Temp 98.4 F (36.9 C) (Oral)   Resp 20   Ht 5\' 7"  (1.702 m)   Wt 160 lb 0.9 oz (72.6 kg)   SpO2 99%   BMI 25.07 kg/m   Visual Acuity Right Eye Distance:   Left Eye Distance:   Bilateral Distance:    Right Eye Near:   Left Eye Near:    Bilateral Near:     Physical Exam Vitals and nursing note reviewed.  Constitutional:      General: She is not in acute distress.    Appearance: Normal appearance. She is not ill-appearing.  HENT:     Head: Normocephalic and atraumatic.     Right Ear: Tympanic membrane normal.     Left Ear: Tympanic membrane normal.     Nose: Congestion present.     Mouth/Throat:     Mouth: Mucous membranes are moist.     Pharynx: No oropharyngeal exudate or posterior oropharyngeal erythema.  Eyes:     Conjunctiva/sclera: Conjunctivae normal.  Cardiovascular:     Rate and Rhythm: Normal rate and regular  rhythm.     Heart sounds: Normal heart sounds. No murmur heard. Pulmonary:     Effort: Pulmonary effort is normal. No respiratory distress.     Breath sounds: Normal breath sounds. No wheezing, rhonchi or rales.  Skin:    General: Skin is warm and dry.  Neurological:     Mental Status: She is alert.  Psychiatric:        Mood and Affect: Mood normal.        Thought Content: Thought content normal.      UC Treatments / Results  Labs (all labs ordered are listed, but only abnormal results are displayed) Labs Reviewed - No data to display  EKG   Radiology No results found.  Procedures Procedures (including critical care time)  Medications Ordered in UC Medications - No data to display  Initial Impression / Assessment and Plan / UC Course  I have reviewed the triage vital signs and the nursing notes.  Pertinent labs & imaging results that were available during my care of the patient were reviewed by me and considered in my medical  decision making (see chart for details).    Chest x-ray without abnormal findings.  Will treat to cover bronchitis with steroid burst.  Recommended follow-up if no gradual improvement with any further concerns.  Final Clinical Impressions(s) / UC Diagnoses   Final diagnoses:  Cough, unspecified type  Acute bronchitis, unspecified organism   Discharge Instructions   None    ED Prescriptions     Medication Sig Dispense Auth. Provider   predniSONE (DELTASONE) 20 MG tablet Take 2 tablets (40 mg total) by mouth daily with breakfast for 5 days. 10 tablet Tomi Bamberger, PA-C      PDMP not reviewed this encounter.   Tomi Bamberger, PA-C 01/04/23 1306

## 2023-01-26 ENCOUNTER — Other Ambulatory Visit: Payer: Self-pay

## 2023-01-26 ENCOUNTER — Other Ambulatory Visit (HOSPITAL_COMMUNITY): Payer: Self-pay

## 2023-02-02 ENCOUNTER — Ambulatory Visit (HOSPITAL_BASED_OUTPATIENT_CLINIC_OR_DEPARTMENT_OTHER)
Admission: RE | Admit: 2023-02-02 | Discharge: 2023-02-02 | Disposition: A | Discharge status: Home / Self Care | Payer: Commercial Managed Care - PPO | Source: Ambulatory Visit | Attending: Internal Medicine | Admitting: Internal Medicine

## 2023-02-02 DIAGNOSIS — M8580 Other specified disorders of bone density and structure, unspecified site: Secondary | ICD-10-CM | POA: Diagnosis not present

## 2023-02-02 DIAGNOSIS — M85851 Other specified disorders of bone density and structure, right thigh: Secondary | ICD-10-CM | POA: Diagnosis not present

## 2023-02-25 ENCOUNTER — Other Ambulatory Visit (HOSPITAL_COMMUNITY): Payer: Self-pay

## 2023-02-28 ENCOUNTER — Other Ambulatory Visit (HOSPITAL_COMMUNITY): Payer: Self-pay

## 2023-02-28 ENCOUNTER — Encounter (HOSPITAL_COMMUNITY): Payer: Self-pay

## 2023-02-28 MED ORDER — ADZENYS XR-ODT 12.5 MG PO TBED
1.0000 | EXTENDED_RELEASE_TABLET | Freq: Every morning | ORAL | 0 refills | Status: DC
  Filled 2023-02-28: qty 30, 30d supply, fill #0

## 2023-04-05 DIAGNOSIS — H5213 Myopia, bilateral: Secondary | ICD-10-CM | POA: Diagnosis not present

## 2023-04-05 DIAGNOSIS — H52223 Regular astigmatism, bilateral: Secondary | ICD-10-CM | POA: Diagnosis not present

## 2023-04-05 DIAGNOSIS — H524 Presbyopia: Secondary | ICD-10-CM | POA: Diagnosis not present

## 2023-04-12 ENCOUNTER — Other Ambulatory Visit (HOSPITAL_COMMUNITY): Payer: Self-pay

## 2023-04-12 MED ORDER — AMPHETAMINE-DEXTROAMPHETAMINE 10 MG PO TABS
10.0000 mg | ORAL_TABLET | Freq: Every day | ORAL | 0 refills | Status: AC | PRN
Start: 2023-04-12 — End: ?
  Filled 2023-04-12: qty 30, 30d supply, fill #0

## 2023-04-12 MED ORDER — ADZENYS XR-ODT 12.5 MG PO TBED
1.0000 | EXTENDED_RELEASE_TABLET | Freq: Every morning | ORAL | 0 refills | Status: DC
  Filled 2023-04-12: qty 30, 30d supply, fill #0

## 2023-05-10 ENCOUNTER — Other Ambulatory Visit (HOSPITAL_COMMUNITY): Payer: Self-pay

## 2023-05-10 ENCOUNTER — Other Ambulatory Visit: Payer: Self-pay

## 2023-05-11 ENCOUNTER — Encounter (HOSPITAL_COMMUNITY): Payer: Self-pay

## 2023-05-11 ENCOUNTER — Other Ambulatory Visit (HOSPITAL_COMMUNITY): Payer: Self-pay

## 2023-05-11 MED ORDER — SERTRALINE HCL 50 MG PO TABS
50.0000 mg | ORAL_TABLET | Freq: Every day | ORAL | 1 refills | Status: DC
  Filled 2023-05-11: qty 90, 90d supply, fill #0
  Filled 2023-08-30: qty 90, 90d supply, fill #1

## 2023-06-28 ENCOUNTER — Other Ambulatory Visit (HOSPITAL_COMMUNITY): Payer: Self-pay

## 2023-06-28 MED ORDER — ADZENYS XR-ODT 12.5 MG PO TBED
12.5000 mg | EXTENDED_RELEASE_TABLET | Freq: Every morning | ORAL | 0 refills | Status: AC
  Filled 2023-06-28: qty 30, 30d supply, fill #0

## 2023-08-17 ENCOUNTER — Other Ambulatory Visit: Payer: Self-pay | Admitting: *Deleted

## 2023-08-17 DIAGNOSIS — Z170 Estrogen receptor positive status [ER+]: Secondary | ICD-10-CM

## 2023-08-29 NOTE — Unmapped External Note (Signed)
 Special OR Call completed to review [facility] Program requirements. - No answer, LVMM with CB info.

## 2023-08-30 ENCOUNTER — Other Ambulatory Visit (HOSPITAL_COMMUNITY): Payer: Self-pay

## 2023-08-30 ENCOUNTER — Encounter (HOSPITAL_COMMUNITY): Payer: Self-pay

## 2023-08-30 MED ORDER — ADZENYS XR-ODT 12.5 MG PO TBED
1.0000 | EXTENDED_RELEASE_TABLET | ORAL | 0 refills | Status: DC
  Filled 2023-08-30: qty 30, 30d supply, fill #0

## 2023-09-01 ENCOUNTER — Other Ambulatory Visit (HOSPITAL_COMMUNITY): Payer: Self-pay

## 2023-09-29 ENCOUNTER — Telehealth: Payer: Self-pay | Admitting: Pharmacy Technician

## 2023-09-29 NOTE — Telephone Encounter (Signed)
 LVM for patient reminding them of upcoming appt 10/06/23 for lab/MD and asking them to call back to answer annual questionnaires for study.   Crystal Keven BS [facility]  Clinical Research Specialist II Oncology Services  Direct Dial: [phone] Fax: [phone]   09/29/2023 9:41 AM

## 2023-10-05 ENCOUNTER — Other Ambulatory Visit: Payer: Self-pay | Admitting: *Deleted

## 2023-10-05 DIAGNOSIS — Z170 Estrogen receptor positive status [ER+]: Secondary | ICD-10-CM

## 2023-10-06 ENCOUNTER — Encounter: Payer: Self-pay | Admitting: *Deleted

## 2023-10-06 ENCOUNTER — Inpatient Hospital Stay

## 2023-10-06 ENCOUNTER — Inpatient Hospital Stay: Attending: Hematology and Oncology | Admitting: Hematology and Oncology

## 2023-10-06 VITALS — BP 127/78 | HR 90 | Temp 98.2°F | Resp 17 | Ht 66.0 in | Wt 169.8 lb

## 2023-10-06 DIAGNOSIS — F419 Anxiety disorder, unspecified: Secondary | ICD-10-CM | POA: Diagnosis not present

## 2023-10-06 DIAGNOSIS — Z881 Allergy status to other antibiotic agents status: Secondary | ICD-10-CM | POA: Insufficient documentation

## 2023-10-06 DIAGNOSIS — R413 Other amnesia: Secondary | ICD-10-CM | POA: Diagnosis not present

## 2023-10-06 DIAGNOSIS — R232 Flushing: Secondary | ICD-10-CM | POA: Insufficient documentation

## 2023-10-06 DIAGNOSIS — Z170 Estrogen receptor positive status [ER+]: Secondary | ICD-10-CM | POA: Insufficient documentation

## 2023-10-06 DIAGNOSIS — Z79811 Long term (current) use of aromatase inhibitors: Secondary | ICD-10-CM | POA: Diagnosis not present

## 2023-10-06 DIAGNOSIS — Z923 Personal history of irradiation: Secondary | ICD-10-CM | POA: Diagnosis not present

## 2023-10-06 DIAGNOSIS — C50811 Malignant neoplasm of overlapping sites of right female breast: Secondary | ICD-10-CM | POA: Insufficient documentation

## 2023-10-06 DIAGNOSIS — M8580 Other specified disorders of bone density and structure, unspecified site: Secondary | ICD-10-CM | POA: Diagnosis not present

## 2023-10-06 DIAGNOSIS — Z79899 Other long term (current) drug therapy: Secondary | ICD-10-CM | POA: Insufficient documentation

## 2023-10-06 DIAGNOSIS — Z9013 Acquired absence of bilateral breasts and nipples: Secondary | ICD-10-CM | POA: Insufficient documentation

## 2023-10-06 DIAGNOSIS — Z9221 Personal history of antineoplastic chemotherapy: Secondary | ICD-10-CM | POA: Diagnosis not present

## 2023-10-06 DIAGNOSIS — R5383 Other fatigue: Secondary | ICD-10-CM | POA: Insufficient documentation

## 2023-10-06 DIAGNOSIS — Z1721 Progesterone receptor positive status: Secondary | ICD-10-CM | POA: Diagnosis not present

## 2023-10-06 DIAGNOSIS — C50411 Malignant neoplasm of upper-outer quadrant of right female breast: Secondary | ICD-10-CM | POA: Diagnosis not present

## 2023-10-06 DIAGNOSIS — F22 Delusional disorders: Secondary | ICD-10-CM | POA: Diagnosis not present

## 2023-10-06 DIAGNOSIS — N951 Menopausal and female climacteric states: Secondary | ICD-10-CM | POA: Insufficient documentation

## 2023-10-06 LAB — CBC WITH DIFFERENTIAL (CANCER CENTER ONLY)
Abs Immature Granulocytes: 0.01 K/uL (ref 0.00–0.07)
Basophils Absolute: 0.1 K/uL (ref 0.0–0.1)
Basophils Relative: 1 %
Eosinophils Absolute: 0.1 K/uL (ref 0.0–0.5)
Eosinophils Relative: 1 %
HCT: 37.6 % (ref 36.0–46.0)
Hemoglobin: 13.2 g/dL (ref 12.0–15.0)
Immature Granulocytes: 0 %
Lymphocytes Relative: 22 %
Lymphs Abs: 1 K/uL (ref 0.7–4.0)
MCH: 30.8 pg (ref 26.0–34.0)
MCHC: 35.1 g/dL (ref 30.0–36.0)
MCV: 87.9 fL (ref 80.0–100.0)
Monocytes Absolute: 0.3 K/uL (ref 0.1–1.0)
Monocytes Relative: 7 %
Neutro Abs: 3 K/uL (ref 1.7–7.7)
Neutrophils Relative %: 69 %
Platelet Count: 207 K/uL (ref 150–400)
RBC: 4.28 MIL/uL (ref 3.87–5.11)
RDW: 12.3 % (ref 11.5–15.5)
WBC Count: 4.3 K/uL (ref 4.0–10.5)
nRBC: 0 % (ref 0.0–0.2)

## 2023-10-06 LAB — CMP (CANCER CENTER ONLY)
ALT: 15 U/L (ref 0–44)
AST: 19 U/L (ref 15–41)
Albumin: 4.6 g/dL (ref 3.5–5.0)
Alkaline Phosphatase: 71 U/L (ref 38–126)
Anion gap: 6 (ref 5–15)
BUN: 14 mg/dL (ref 6–20)
CO2: 28 mmol/L (ref 22–32)
Calcium: 9.6 mg/dL (ref 8.9–10.3)
Chloride: 104 mmol/L (ref 98–111)
Creatinine: 0.66 mg/dL (ref 0.44–1.00)
GFR, Estimated: >60 mL/min (ref >60)
Glucose, Bld: 101 mg/dL — ABNORMAL HIGH (ref 70–99)
Potassium: 3.8 mmol/L (ref 3.5–5.1)
Sodium: 138 mmol/L (ref 135–145)
Total Bilirubin: 0.8 mg/dL (ref 0.0–1.2)
Total Protein: 7.5 g/dL (ref 6.5–8.1)

## 2023-10-06 LAB — RESEARCH LABS

## 2023-10-06 NOTE — Assessment & Plan Note (Signed)
 Bilateral mastectomies 10/01/2015 With reconstruction Right mastectomy: IDC grade 2, multifocal largest 2.5 cm, with high-grade DCIS, broadly present at  anterior margin and inferior margin 2/5 LN positive, LVI Present,   Left mastectomy: ALH, ER 95%, PR 5-65%, HER-2 negative, Ki-67 10-20%,  Pathologic stage: T2 N1 a stage IIB Mammaprint: High risk CT-CAP and bone scan: No evidence of metastatic disease ----------------------------------------------------------------------------------------------------------------------------------------------------------------- Treatment summary: 1. Adjuvant chemotherapy with dose dense Adriamycin  and Cytoxan  4 followed by Abraxane  weekly 12 started 11/05/2015 completed 03/17/2016 3. Adjuvant radiation therapy Completed 06/08/2016 4. Adjuvant antiestrogen therapy with anastrozole   7 years started 06/30/2016   PREVENT trial: [id number]  [name] clinical trial: Off Ibrance  because of study did not show benefit -------------------------------------------------------------------------------------------------------------------------------------------------------- Anastrozole  Toxicities: No hot flashes or myalgias. Patient is tolerating anastrozole  extremely well. She completed 7 years of therapy.  We discussed the role of extended endocrine therapy to 10 versus 7.   Fatigue: Related to her high intensity of work Memory issues: Continues to be a problem. Vaginal dryness: Related to anastrozole  Bone density 01/29/2023: T-score -2.1: Osteopenia recommend calcium  vitamin D and weightbearing exercises     No clinical concern for disease recurrence. Chest examination 10/06/2023: No palpable lumps or nodules   Return to clinic in 1 year for follow-up

## 2023-10-06 NOTE — Research (Signed)
 Research- [facility] AFT-05 [name]- Follow-Up Phase II - Year 7/Month 84 visit   Patient came into clinic today by herself to complete her year 7 (month 91) visit for the [name] study.   Research blood:  The pt's year 7/month 84 samples were drawn today. The samples will be shipped today.     Anti-cancer medications- Patient states she is continuing to take anastrozole  without interruption; she is on no new anti-cancer medications.  During the pt's clinic visit, Dr. Gudena stopped her anastrozole  medication.  No new anti-cancer medications were prescribed.     Serious adverse events- Patient denies any new, serious health problems since her last visit. She did report that she will need a knee replacement in the near future.     Disease monitoring- Dr. Odean reviewed pt's labs today.  All of her labs were WNL.   He discussed about adding a Guardant lab sample to monitor for CTC's.  The pt was given the brochure to read about the test.  See MD notes from today's encounter.  The pt has not had any imaging done this year.  The pt was encouraged to follow up with her plastic surgeon to check her implants.  The pt verbalized understanding.     COVID-19- Patient reports having a COVID-19 during this summer.   The pt was unsure of the exact date, but she states she had it around mid-June 2025. She states that she did get a COVID booster in 2024 (date unknown).     The pt is aware that her next research visit will be in 1 year for her year 8/follow up Phase II visit.  Thanked the pt for her continued participation in this research study.   Levon FREDRIK Sandifer RN, BSN, CCRP Clinical Research Nurse Lead 10/06/2023 4:21 PM

## 2023-10-06 NOTE — Progress Notes (Signed)
 Patient Care Team: Stephane Leita DEL, MD as PCP - General (Internal Medicine) Ebbie Cough, MD as Consulting Physician (General Surgery) Odean Potts, MD as Consulting Physician (Hematology and Oncology) Crawford, Morna Pickle, NP as Nurse Practitioner (Hematology and Oncology) [name] Rush, MD as Consulting Physician (Radiation Oncology) Bensimhon, Toribio SAUNDERS, MD as Consulting Physician (Cardiology)  DIAGNOSIS:  Encounter Diagnosis  Name Primary?   Malignant neoplasm of upper-outer quadrant of right breast in female, estrogen receptor positive (HCC) Yes    SUMMARY OF ONCOLOGIC HISTORY: Oncology History  Breast cancer of upper-outer quadrant of right female breast (HCC)  09/02/2015 Mammogram   Right breast UOQ: 2 masses 3 cm apart, U/S they measured 3.4 cm, larger mass at 1.6 cm, in addition, 1 cm mass at 9:00 and 0.9 cm mass at 8:30( could be intramammary LN); right axillary LN 11 mm; microcalcs 10 cm span    09/09/2015 Initial Diagnosis   Right breast biopsy OUQ: DCIS with calcs and necrosis ER 95%, PR 80%; 9:30 position 5cmfn: IDC with DCIS ER 95%, PR 65%, Ki-67 20%,; right biopsy 8:30 position 3cmfn ER 95%, PR 5%, Ki-67 10%: intramammary LN with IDC   09/12/2015 Breast MRI   Multicentric right breast cancer cluster of enhancing masses 4.1 x 2.2 x 2.6 cm, LOQ: 1.1 cm enhancing mass previously biopsied, subcutaneous low right breast 2 cm mass not biopsied, multiple other small enhancing masses, 1 cm right axillary lymph node   10/01/2015 Surgery   Right mastectomy: IDC grade 2, multifocal largest 2.5 cm, with high-grade DCIS, broadly present at  anterior margin and inferior margin 2/5 LN positive, LVI Present,  Left mastectomy: ALH, ER 95%, PR 5-65%, HER-2 negative, Ki-67 10-20%, T2 N1 a stage IIB   11/05/2015 - 03/17/2016 Chemotherapy   Adjuvant chemotherapy with dose dense Adriamycin  and Cytoxan  4 followed by Abraxane  weekly 12   04/22/2016 - 06/08/2016 Radiation Therapy   Adjuvant  radiation therapy   06/22/2016 Surgery   Hysterectomy with bilateral salpingo-oophorectomy: Negative for malignancy   06/30/2016 -  Anti-estrogen oral therapy   Anastrozole  1 mg daily, patient is on a clinical trial [name] ( randomized to Palbociclib )     CHIEF COMPLIANT: Surveillance of breast cancer on anastrozole  therapy  HISTORY OF PRESENT ILLNESS:  History of Present Illness Melanie Barber is a 58 year old female with breast cancer who presents for follow-up regarding her ongoing symptoms and medication management.  She experiences significant fatigue, which she attributes to her demanding work schedule and possibly her medication, anastrozole . She has been on anastrozole  for seven and a half years and experiences persistent, severe hot flashes, especially when wearing scrubs and sterile garb. Various methods to alleviate the hot flashes have been attempted without success.  Her medication history includes anastrozole  and Ibrance  (palbociclib ), prescribed in 2018. She is currently on Adzenys  for memory issues and occasionally takes melatonin for sleep. She discontinued Concerta  due to anxiety and paranoia.  She has osteopenia with a bone density score of minus 2.1, slightly worsened from minus 1.9 two years ago. Her exercise routine is inconsistent due to work and weather, but she plans to resume with the change in seasons.     ALLERGIES:  is allergic to keflex [cephalexin]. PHYSICAL EXAMINATION: ECOG PERFORMANCE STATUS: 1 - Symptomatic but completely ambulatory  Vitals:   10/06/23 1530  BP: 127/78  Pulse: 90  Resp: 17  Temp: 98.2 F (36.8 C)  SpO2: 96%   Filed Weights   10/06/23 1530  Weight: 169  lb 12.8 oz (77 kg)      LABORATORY DATA:  I have reviewed the data as listed    Latest Ref Rng & Units 07/04/2018    8:21 AM 03/22/2018   11:25 AM 12/27/2017   10:09 AM  CMP  Glucose 70 - 99 mg/dL 82  78  76   BUN 6 - 20 mg/dL 16  11  14    Creatinine 0.44 - 1.00  mg/dL 9.14  9.22  9.21   Sodium 135 - 145 mmol/L 140  140  142   Potassium 3.5 - 5.1 mmol/L 4.1  4.1  4.0   Chloride 98 - 111 mmol/L 105  105  105   CO2 22 - 32 mmol/L 24  26  28    Calcium  8.9 - 10.3 mg/dL 8.9  8.8  9.0   Total Protein 6.5 - 8.1 g/dL 7.0  6.9  6.9   Total Bilirubin 0.3 - 1.2 mg/dL 0.7  0.7  0.5   Alkaline Phos 38 - 126 U/L 80  70  65   AST 15 - 41 U/L 27  19  18    ALT 0 - 44 U/L 37  19  15     Lab Results  Component Value Date   WBC 4.3 10/06/2023   HGB 13.2 10/06/2023   HCT 37.6 10/06/2023   MCV 87.9 10/06/2023   PLT 207 10/06/2023   NEUTROABS 3.0 10/06/2023    ASSESSMENT & PLAN:  Breast cancer of upper-outer quadrant of right female breast (HCC) Bilateral mastectomies 10/01/2015 With reconstruction Right mastectomy: IDC grade 2, multifocal largest 2.5 cm, with high-grade DCIS, broadly present at  anterior margin and inferior margin 2/5 LN positive, LVI Present,   Left mastectomy: ALH, ER 95%, PR 5-65%, HER-2 negative, Ki-67 10-20%,  Pathologic stage: T2 N1 a stage IIB Mammaprint: High risk CT-CAP and bone scan: No evidence of metastatic disease ----------------------------------------------------------------------------------------------------------------------------------------------------------------- Treatment summary: 1. Adjuvant chemotherapy with dose dense Adriamycin  and Cytoxan  4 followed by Abraxane  weekly 12 started 11/05/2015 completed 03/17/2016 3. Adjuvant radiation therapy Completed 06/08/2016 4. Adjuvant antiestrogen therapy with anastrozole   7 years started 06/30/2016 completed 10/06/2023   PREVENT trial: [id number]  [name] clinical trial: Off Ibrance  because of study did not show benefit -------------------------------------------------------------------------------------------------------------------------------------------------------- Anastrozole  Toxicities: No hot flashes or myalgias. Patient is tolerating anastrozole  extremely  well. She completed 7 years of therapy.  She can stop anastrozole  at this time   Fatigue: Related to her high intensity of work Memory issues: Continues to be a problem. Vaginal dryness: Related to anastrozole  Bone density 01/29/2023: T-score -2.1: Osteopenia recommend calcium  vitamin D and weightbearing exercises     No clinical concern for disease recurrence. Chest examination 10/06/2023: No palpable lumps or nodules Recommended guardant reveal for MRD testing Assessment & Plan Osteopenia Bone density score of -2.1, slight progression from -1.9. Plans to resume exercise. - Continue bone density testing every two years. - Encourage regular exercise.  Cognitive symptoms (memory loss) Memory loss not age-related. Finds Adzenys  helpful   Telephone visit in 1 year for routine surveillance and follow-up   No orders of the defined types were placed in this encounter.  The patient has a good understanding of the overall plan. she agrees with it. she will call with any problems that may develop before the next visit here. Total time spent: 30 mins including face to face time and time spent for planning, charting and co-ordination of care   Viinay K Tayla Panozzo, MD 10/06/23

## 2023-10-07 NOTE — Telephone Encounter (Signed)
 Per md orders entered for [facility] and all supported documents faxed to [phone]. Faxed confirmation was received.

## 2023-10-17 ENCOUNTER — Other Ambulatory Visit (HOSPITAL_COMMUNITY): Payer: Self-pay

## 2023-10-18 ENCOUNTER — Other Ambulatory Visit (HOSPITAL_COMMUNITY): Payer: Self-pay

## 2023-10-18 MED ORDER — SERTRALINE HCL 50 MG PO TABS
50.0000 mg | ORAL_TABLET | Freq: Every day | ORAL | 3 refills | Status: AC
  Filled 2023-10-18: qty 90, 90d supply, fill #0

## 2023-10-19 ENCOUNTER — Other Ambulatory Visit (HOSPITAL_COMMUNITY): Payer: Self-pay

## 2023-10-19 MED ORDER — AMPHETAMINE ER 12.5 MG PO TBED
12.5000 mg | EXTENDED_RELEASE_TABLET | ORAL | 0 refills | Status: DC
  Filled 2023-10-19 – 2023-10-24 (×2): qty 30, 30d supply, fill #0

## 2023-10-21 ENCOUNTER — Encounter: Payer: Self-pay | Admitting: *Deleted

## 2023-10-21 NOTE — Progress Notes (Signed)
 Received message from [facility] team stating patient was non responsive to mobile phlebotomy team.  Testing will be canceled at this time.  If pt wishes to proceed in the future, orders will be placed.

## 2023-10-24 ENCOUNTER — Other Ambulatory Visit (HOSPITAL_COMMUNITY): Payer: Self-pay

## 2023-10-25 ENCOUNTER — Other Ambulatory Visit (HOSPITAL_COMMUNITY): Payer: Self-pay

## 2023-11-07 ENCOUNTER — Other Ambulatory Visit: Payer: Self-pay | Admitting: Medical Genetics

## 2023-11-07 DIAGNOSIS — Z006 Encounter for examination for normal comparison and control in clinical research program: Secondary | ICD-10-CM

## 2023-11-24 ENCOUNTER — Other Ambulatory Visit (HOSPITAL_COMMUNITY): Payer: Self-pay

## 2023-11-24 MED ORDER — AMPHETAMINE-DEXTROAMPHETAMINE 10 MG PO TABS
10.0000 mg | ORAL_TABLET | Freq: Every day | ORAL | 0 refills | Status: AC
  Filled 2023-11-24: qty 30, 30d supply, fill #0

## 2023-11-24 MED ORDER — PAROXETINE HCL 10 MG PO TABS
10.0000 mg | ORAL_TABLET | Freq: Every morning | ORAL | 0 refills | Status: AC
  Filled 2023-11-24: qty 30, 30d supply, fill #0

## 2023-11-26 ENCOUNTER — Other Ambulatory Visit (HOSPITAL_COMMUNITY): Payer: Self-pay

## 2023-12-02 ENCOUNTER — Other Ambulatory Visit (HOSPITAL_COMMUNITY): Payer: Self-pay

## 2023-12-02 MED ORDER — ROSUVASTATIN CALCIUM 10 MG PO TABS
10.0000 mg | ORAL_TABLET | Freq: Every day | ORAL | 3 refills | Status: AC
  Filled 2023-12-02: qty 90, 90d supply, fill #0

## 2023-12-07 ENCOUNTER — Other Ambulatory Visit: Payer: Self-pay | Admitting: Internal Medicine

## 2023-12-07 DIAGNOSIS — E785 Hyperlipidemia, unspecified: Secondary | ICD-10-CM

## 2023-12-14 ENCOUNTER — Other Ambulatory Visit (HOSPITAL_COMMUNITY): Payer: Self-pay

## 2023-12-15 ENCOUNTER — Other Ambulatory Visit (HOSPITAL_COMMUNITY): Payer: Self-pay

## 2023-12-15 MED ORDER — AMPHETAMINE ER 12.5 MG PO TBED
12.5000 mg | EXTENDED_RELEASE_TABLET | Freq: Every morning | ORAL | 0 refills | Status: DC
  Filled 2023-12-15: qty 30, 30d supply, fill #0

## 2023-12-19 ENCOUNTER — Other Ambulatory Visit (HOSPITAL_COMMUNITY): Payer: Self-pay

## 2023-12-20 ENCOUNTER — Other Ambulatory Visit (HOSPITAL_COMMUNITY): Payer: Self-pay

## 2023-12-30 ENCOUNTER — Other Ambulatory Visit (HOSPITAL_COMMUNITY): Payer: Self-pay

## 2023-12-30 MED ORDER — PAROXETINE HCL 20 MG PO TABS
20.0000 mg | ORAL_TABLET | Freq: Every day | ORAL | 11 refills | Status: AC
  Filled 2023-12-30: qty 30, 30d supply, fill #0
  Filled 2024-01-29: qty 30, 30d supply, fill #1

## 2024-01-10 ENCOUNTER — Other Ambulatory Visit (HOSPITAL_COMMUNITY): Payer: Self-pay

## 2024-01-11 ENCOUNTER — Encounter: Payer: Self-pay | Admitting: Hematology and Oncology

## 2024-01-23 ENCOUNTER — Other Ambulatory Visit (HOSPITAL_COMMUNITY): Payer: Self-pay

## 2024-02-07 ENCOUNTER — Other Ambulatory Visit (HOSPITAL_COMMUNITY): Payer: Self-pay

## 2024-02-07 MED ORDER — AMPHETAMINE ER 12.5 MG PO TBED
12.5000 mg | EXTENDED_RELEASE_TABLET | Freq: Every morning | ORAL | 0 refills | Status: AC
  Filled 2024-02-07: qty 30, 30d supply, fill #0

## 2024-10-04 ENCOUNTER — Inpatient Hospital Stay: Attending: Hematology and Oncology | Admitting: Hematology and Oncology
